# Patient Record
Sex: Female | Born: 1945 | Race: Black or African American | Hispanic: No | Marital: Married | Smoking: Never smoker
Health system: Southern US, Community
[De-identification: ages and names within clinical notes are randomized; demographics above are authoritative.]

## PROBLEM LIST (undated history)

## (undated) DIAGNOSIS — R748 Abnormal levels of other serum enzymes: Secondary | ICD-10-CM

## (undated) DIAGNOSIS — R059 Cough, unspecified: Secondary | ICD-10-CM

## (undated) DIAGNOSIS — I872 Venous insufficiency (chronic) (peripheral): Secondary | ICD-10-CM

## (undated) DIAGNOSIS — R062 Wheezing: Secondary | ICD-10-CM

## (undated) DIAGNOSIS — B379 Candidiasis, unspecified: Secondary | ICD-10-CM

## (undated) DIAGNOSIS — Z860100 Personal history of colon polyps, unspecified: Secondary | ICD-10-CM

## (undated) DIAGNOSIS — I10 Essential (primary) hypertension: Secondary | ICD-10-CM

## (undated) DIAGNOSIS — Z8619 Personal history of other infectious and parasitic diseases: Secondary | ICD-10-CM

## (undated) DIAGNOSIS — J329 Chronic sinusitis, unspecified: Secondary | ICD-10-CM

## (undated) DIAGNOSIS — B059 Measles without complication: Secondary | ICD-10-CM

## (undated) DIAGNOSIS — Z87898 Personal history of other specified conditions: Secondary | ICD-10-CM

## (undated) DIAGNOSIS — R05 Cough: Secondary | ICD-10-CM

## (undated) DIAGNOSIS — IMO0002 Reserved for concepts with insufficient information to code with codable children: Secondary | ICD-10-CM

## (undated) DIAGNOSIS — K219 Gastro-esophageal reflux disease without esophagitis: Secondary | ICD-10-CM

## (undated) DIAGNOSIS — R87619 Unspecified abnormal cytological findings in specimens from cervix uteri: Secondary | ICD-10-CM

## (undated) DIAGNOSIS — N816 Rectocele: Secondary | ICD-10-CM

## (undated) DIAGNOSIS — M1990 Unspecified osteoarthritis, unspecified site: Secondary | ICD-10-CM

## (undated) DIAGNOSIS — E7800 Pure hypercholesterolemia, unspecified: Secondary | ICD-10-CM

## (undated) DIAGNOSIS — L723 Sebaceous cyst: Secondary | ICD-10-CM

## (undated) DIAGNOSIS — K5730 Diverticulosis of large intestine without perforation or abscess without bleeding: Secondary | ICD-10-CM

## (undated) DIAGNOSIS — H269 Unspecified cataract: Secondary | ICD-10-CM

## (undated) DIAGNOSIS — M7989 Other specified soft tissue disorders: Secondary | ICD-10-CM

## (undated) DIAGNOSIS — E663 Overweight: Secondary | ICD-10-CM

## (undated) DIAGNOSIS — D649 Anemia, unspecified: Secondary | ICD-10-CM

## (undated) HISTORY — DX: Venous insufficiency (chronic) (peripheral): I87.2

## (undated) HISTORY — DX: Reserved for concepts with insufficient information to code with codable children: IMO0002

## (undated) HISTORY — DX: Personal history of other specified conditions: Z87.898

## (undated) HISTORY — DX: Unspecified osteoarthritis, unspecified site: M19.90

## (undated) HISTORY — DX: Candidiasis, unspecified: B37.9

## (undated) HISTORY — DX: Chronic sinusitis, unspecified: J32.9

## (undated) HISTORY — DX: Unspecified abnormal cytological findings in specimens from cervix uteri: R87.619

## (undated) HISTORY — DX: Personal history of colon polyps, unspecified: Z86.0100

## (undated) HISTORY — PX: ESOPHAGOGASTRODUODENOSCOPY: SHX1529

## (undated) HISTORY — DX: Gastro-esophageal reflux disease without esophagitis: K21.9

## (undated) HISTORY — DX: Pure hypercholesterolemia, unspecified: E78.00

## (undated) HISTORY — DX: Abnormal levels of other serum enzymes: R74.8

## (undated) HISTORY — DX: Other specified soft tissue disorders: M79.89

## (undated) HISTORY — DX: Sebaceous cyst: L72.3

## (undated) HISTORY — DX: Rectocele: N81.6

## (undated) HISTORY — DX: Diverticulosis of large intestine without perforation or abscess without bleeding: K57.30

## (undated) HISTORY — DX: Essential (primary) hypertension: I10

## (undated) HISTORY — DX: Unspecified cataract: H26.9

## (undated) HISTORY — DX: Personal history of colonic polyps: Z86.010

## (undated) HISTORY — DX: Personal history of other infectious and parasitic diseases: Z86.19

## (undated) HISTORY — DX: Cough, unspecified: R05.9

## (undated) HISTORY — DX: Wheezing: R06.2

## (undated) HISTORY — DX: Overweight: E66.3

## (undated) HISTORY — DX: Anemia, unspecified: D64.9

## (undated) HISTORY — PX: COLONOSCOPY: SHX174

## (undated) HISTORY — DX: Cough: R05

## (undated) HISTORY — DX: Measles without complication: B05.9

---

## 1976-08-22 HISTORY — PX: BREAST SURGERY: SHX581

## 1986-08-22 HISTORY — PX: ABDOMINAL HYSTERECTOMY: SHX81

## 1997-12-19 ENCOUNTER — Other Ambulatory Visit: Admission: RE | Admit: 1997-12-19 | Discharge: 1997-12-19 | Payer: Self-pay | Admitting: Obstetrics and Gynecology

## 1998-12-22 ENCOUNTER — Other Ambulatory Visit: Admission: RE | Admit: 1998-12-22 | Discharge: 1998-12-22 | Payer: Self-pay | Admitting: Obstetrics and Gynecology

## 1999-12-31 ENCOUNTER — Other Ambulatory Visit: Admission: RE | Admit: 1999-12-31 | Discharge: 1999-12-31 | Payer: Self-pay | Admitting: Obstetrics and Gynecology

## 2001-01-02 ENCOUNTER — Other Ambulatory Visit: Admission: RE | Admit: 2001-01-02 | Discharge: 2001-01-02 | Payer: Self-pay | Admitting: Obstetrics and Gynecology

## 2001-04-24 ENCOUNTER — Encounter (INDEPENDENT_AMBULATORY_CARE_PROVIDER_SITE_OTHER): Payer: Self-pay | Admitting: Specialist

## 2001-04-24 ENCOUNTER — Ambulatory Visit (HOSPITAL_COMMUNITY): Admission: RE | Admit: 2001-04-24 | Discharge: 2001-04-24 | Payer: Self-pay | Admitting: Internal Medicine

## 2005-05-20 ENCOUNTER — Ambulatory Visit: Payer: Self-pay | Admitting: Pulmonary Disease

## 2005-08-19 ENCOUNTER — Ambulatory Visit: Payer: Self-pay | Admitting: Pulmonary Disease

## 2005-12-26 ENCOUNTER — Ambulatory Visit: Payer: Self-pay | Admitting: Pulmonary Disease

## 2006-01-02 ENCOUNTER — Ambulatory Visit: Payer: Self-pay | Admitting: Pulmonary Disease

## 2006-01-23 ENCOUNTER — Ambulatory Visit: Payer: Self-pay | Admitting: Pulmonary Disease

## 2007-02-19 ENCOUNTER — Ambulatory Visit: Payer: Self-pay | Admitting: Pulmonary Disease

## 2007-02-19 LAB — CONVERTED CEMR LAB
BUN: 10 mg/dL (ref 6–23)
GFR calc Af Amer: 109 mL/min
GFR calc non Af Amer: 90 mL/min
Potassium: 4 meq/L (ref 3.5–5.1)

## 2007-09-06 ENCOUNTER — Encounter: Payer: Self-pay | Admitting: Pulmonary Disease

## 2007-09-27 DIAGNOSIS — K219 Gastro-esophageal reflux disease without esophagitis: Secondary | ICD-10-CM

## 2007-09-27 DIAGNOSIS — I872 Venous insufficiency (chronic) (peripheral): Secondary | ICD-10-CM

## 2007-09-27 DIAGNOSIS — J45909 Unspecified asthma, uncomplicated: Secondary | ICD-10-CM

## 2007-09-27 DIAGNOSIS — D126 Benign neoplasm of colon, unspecified: Secondary | ICD-10-CM

## 2007-09-27 DIAGNOSIS — I10 Essential (primary) hypertension: Secondary | ICD-10-CM

## 2008-01-16 ENCOUNTER — Encounter: Payer: Self-pay | Admitting: Pulmonary Disease

## 2008-03-10 ENCOUNTER — Telehealth (INDEPENDENT_AMBULATORY_CARE_PROVIDER_SITE_OTHER): Payer: Self-pay | Admitting: *Deleted

## 2008-03-20 ENCOUNTER — Ambulatory Visit: Payer: Self-pay | Admitting: Pulmonary Disease

## 2008-03-20 DIAGNOSIS — J309 Allergic rhinitis, unspecified: Secondary | ICD-10-CM | POA: Insufficient documentation

## 2008-03-20 DIAGNOSIS — E785 Hyperlipidemia, unspecified: Secondary | ICD-10-CM | POA: Insufficient documentation

## 2008-03-20 DIAGNOSIS — E663 Overweight: Secondary | ICD-10-CM | POA: Insufficient documentation

## 2008-03-20 DIAGNOSIS — K5730 Diverticulosis of large intestine without perforation or abscess without bleeding: Secondary | ICD-10-CM | POA: Insufficient documentation

## 2008-03-20 DIAGNOSIS — J329 Chronic sinusitis, unspecified: Secondary | ICD-10-CM | POA: Insufficient documentation

## 2008-03-21 ENCOUNTER — Ambulatory Visit: Payer: Self-pay | Admitting: Pulmonary Disease

## 2008-03-21 ENCOUNTER — Telehealth (INDEPENDENT_AMBULATORY_CARE_PROVIDER_SITE_OTHER): Payer: Self-pay | Admitting: *Deleted

## 2008-03-24 ENCOUNTER — Ambulatory Visit: Payer: Self-pay | Admitting: Pulmonary Disease

## 2008-03-25 LAB — CONVERTED CEMR LAB
Albumin: 3.9 g/dL (ref 3.5–5.2)
Alkaline Phosphatase: 230 units/L — ABNORMAL HIGH (ref 39–117)
BUN: 8 mg/dL (ref 6–23)
Basophils Absolute: 0.1 10*3/uL (ref 0.0–0.1)
Cholesterol: 202 mg/dL (ref 0–200)
Direct LDL: 141.4 mg/dL
Eosinophils Absolute: 0.1 10*3/uL (ref 0.0–0.7)
Eosinophils Relative: 2.2 % (ref 0.0–5.0)
GFR calc Af Amer: 108 mL/min
GFR calc non Af Amer: 90 mL/min
HCT: 38.7 % (ref 36.0–46.0)
MCHC: 33.1 g/dL (ref 30.0–36.0)
MCV: 79.7 fL (ref 78.0–100.0)
Monocytes Absolute: 0.4 10*3/uL (ref 0.1–1.0)
Platelets: 262 10*3/uL (ref 150–400)
Potassium: 3.7 meq/L (ref 3.5–5.1)
RDW: 14.5 % (ref 11.5–14.6)
Triglycerides: 53 mg/dL (ref 0–149)

## 2008-04-04 ENCOUNTER — Encounter: Payer: Self-pay | Admitting: Pulmonary Disease

## 2008-05-02 ENCOUNTER — Encounter: Payer: Self-pay | Admitting: Pulmonary Disease

## 2008-08-11 ENCOUNTER — Ambulatory Visit: Payer: Self-pay | Admitting: Internal Medicine

## 2008-08-11 DIAGNOSIS — G4700 Insomnia, unspecified: Secondary | ICD-10-CM | POA: Insufficient documentation

## 2008-09-08 ENCOUNTER — Encounter: Payer: Self-pay | Admitting: Pulmonary Disease

## 2008-10-20 HISTORY — PX: APOGEE / PERIGEE REPAIR: SHX1182

## 2008-11-05 ENCOUNTER — Ambulatory Visit (HOSPITAL_COMMUNITY): Admission: RE | Admit: 2008-11-05 | Discharge: 2008-11-06 | Payer: Self-pay | Admitting: Obstetrics and Gynecology

## 2008-11-05 DIAGNOSIS — IMO0002 Reserved for concepts with insufficient information to code with codable children: Secondary | ICD-10-CM

## 2008-11-05 HISTORY — PX: ANTERIOR AND POSTERIOR REPAIR: SHX1172

## 2008-11-05 HISTORY — DX: Reserved for concepts with insufficient information to code with codable children: IMO0002

## 2008-11-06 DIAGNOSIS — N816 Rectocele: Secondary | ICD-10-CM

## 2008-11-06 DIAGNOSIS — Z87898 Personal history of other specified conditions: Secondary | ICD-10-CM

## 2008-11-06 HISTORY — DX: Rectocele: N81.6

## 2008-11-06 HISTORY — DX: Personal history of other specified conditions: Z87.898

## 2009-05-04 ENCOUNTER — Encounter: Payer: Self-pay | Admitting: Pulmonary Disease

## 2009-06-24 ENCOUNTER — Telehealth: Payer: Self-pay | Admitting: Pulmonary Disease

## 2009-07-01 ENCOUNTER — Ambulatory Visit: Payer: Self-pay | Admitting: Pulmonary Disease

## 2009-07-01 DIAGNOSIS — M1990 Unspecified osteoarthritis, unspecified site: Secondary | ICD-10-CM | POA: Insufficient documentation

## 2009-07-01 DIAGNOSIS — R748 Abnormal levels of other serum enzymes: Secondary | ICD-10-CM | POA: Insufficient documentation

## 2009-07-01 DIAGNOSIS — M899 Disorder of bone, unspecified: Secondary | ICD-10-CM

## 2009-07-01 DIAGNOSIS — M949 Disorder of cartilage, unspecified: Secondary | ICD-10-CM

## 2009-07-01 HISTORY — DX: Abnormal levels of other serum enzymes: R74.8

## 2009-07-02 LAB — CONVERTED CEMR LAB
ALT: 28 units/L (ref 0–35)
Albumin: 3.9 g/dL (ref 3.5–5.2)
Basophils Relative: 0.3 % (ref 0.0–3.0)
Cholesterol: 171 mg/dL (ref 0–200)
Eosinophils Relative: 2.2 % (ref 0.0–5.0)
GFR calc non Af Amer: 107.78 mL/min (ref >60)
GGT: 54 units/L — ABNORMAL HIGH (ref 7–51)
Glucose, Bld: 98 mg/dL (ref 70–99)
HDL: 56.9 mg/dL (ref >39.00)
Lymphocytes Relative: 44.5 % (ref 12.0–46.0)
Neutrophils Relative %: 44.5 % (ref 43.0–77.0)
Potassium: 3.5 meq/L (ref 3.5–5.1)
RBC: 4.66 M/uL (ref 3.87–5.11)
Sodium: 143 meq/L (ref 135–145)
Total Protein: 8.1 g/dL (ref 6.0–8.3)
VLDL: 14 mg/dL (ref 0.0–40.0)
WBC: 4.9 10*3/uL (ref 4.5–10.5)

## 2009-07-03 LAB — CONVERTED CEMR LAB: Vit D, 25-Hydroxy: 23 ng/mL — ABNORMAL LOW (ref 30–89)

## 2009-07-09 ENCOUNTER — Encounter: Payer: Self-pay | Admitting: Pulmonary Disease

## 2009-08-22 HISTORY — PX: OTHER SURGICAL HISTORY: SHX169

## 2009-09-23 ENCOUNTER — Encounter: Payer: Self-pay | Admitting: Pulmonary Disease

## 2009-12-23 ENCOUNTER — Telehealth: Payer: Self-pay | Admitting: Pulmonary Disease

## 2010-05-05 ENCOUNTER — Encounter: Payer: Self-pay | Admitting: Pulmonary Disease

## 2010-07-29 ENCOUNTER — Ambulatory Visit: Payer: Self-pay | Admitting: Pulmonary Disease

## 2010-08-02 ENCOUNTER — Ambulatory Visit: Payer: Self-pay | Admitting: Pulmonary Disease

## 2010-08-02 ENCOUNTER — Encounter: Payer: Self-pay | Admitting: Pulmonary Disease

## 2010-08-09 ENCOUNTER — Telehealth: Payer: Self-pay | Admitting: Pulmonary Disease

## 2010-08-09 LAB — CONVERTED CEMR LAB
Basophils Relative: 0.7 % (ref 0.0–3.0)
Bilirubin, Direct: 0.1 mg/dL (ref 0.0–0.3)
CO2: 28 meq/L (ref 19–32)
Calcium: 9.3 mg/dL (ref 8.4–10.5)
Creatinine, Ser: 0.7 mg/dL (ref 0.4–1.2)
Eosinophils Absolute: 0.2 10*3/uL (ref 0.0–0.7)
GFR calc non Af Amer: 112.99 mL/min (ref >60.00)
HCT: 36.7 % (ref 36.0–46.0)
HDL: 55.5 mg/dL (ref >39.00)
Hemoglobin: 12.3 g/dL (ref 12.0–15.0)
LDL Cholesterol: 87 mg/dL (ref 0–99)
Lymphocytes Relative: 36.5 % (ref 12.0–46.0)
MCHC: 33.5 g/dL (ref 30.0–36.0)
Monocytes Relative: 7.6 % (ref 3.0–12.0)
Neutro Abs: 2.9 10*3/uL (ref 1.4–7.7)
RBC: 4.43 M/uL (ref 3.87–5.11)
Total CHOL/HDL Ratio: 3
Total Protein: 6.7 g/dL (ref 6.0–8.3)
Triglycerides: 36 mg/dL (ref 0.0–149.0)
VLDL: 7.2 mg/dL (ref 0.0–40.0)

## 2010-09-21 NOTE — Letter (Signed)
[facility]   Imported By: Lennie Odor 05/19/2010 12:21:45  _____________________________________________________________________  External Attachment:    Type:   Image     Comment:   External Document

## 2010-09-21 NOTE — Progress Notes (Signed)
Summary: clarification -   Phone Note From Pharmacy Call back at [phone]   Caller: [facility] Call For: Charlissa Petros  Request: Speak with Nurse Summary of Call: Needs clarification for metoclopramide hcl and also needs to discuss duration for med. Initial call taken by: Darletta Moll,  Dec 23, 2009 10:07 AM  Follow-up for Phone Call        Spoke to pharmacists at [facility] and he states there is a black box warning on long term use of reglan because it can cause tardive dyskinesia. [facility] wants to know if SN wants them to continue to fill reglan? Please advise. Carron Curie CMA  Dec 23, 2009 11:09 AM   Additional Follow-up for Phone Call Additional follow up Details #1::        per SN: stop metoclopramide d/t black box warning.    called spoke with pharmacsit at [facility], advised of SN's recs.  LMOM TCB for pt to notify her of this.  told to ask for leigh. Boone Master CNA  Dec 23, 2009 2:31 PM     Additional Follow-up for Phone Call Additional follow up Details #2::    pt advised. Carron Curie CMA  Dec 23, 2009 4:07 PM

## 2010-09-21 NOTE — Assessment & Plan Note (Signed)
Summary: physcial/cb   CC:  Yearly ROV & review of mult medical problems....  History of Present Illness: 65 y/o BF here for a yearly follow up and med refills... he has multiple medical problems as noted below...    ~  November 10.2010:  For the most part she has had a good year without new complaints or concerns... she is followed regularly by DrSharma who has her on Xyzal, Singulair, Omnaris & allergy shots for her allergies; and Symbicort, Ventolin for her asthma... she appears well controlled w/o recent symptoms or exac...  her BP is well controoled on the Procardia & Lasix; and her Cholesterol is doing well on the Pravachol... her CC is pain in her left knee, and she has gained 11# up to 212# today... we discussed diet/ exercise/ get the weight down!!! We will also check XRay of left knee (DJD- joint effusion)& give her a trial of Mobic> next step= refer to Ortho for further eval.   ~  July 29, 2010:  Yearly ROV- doing reasonably well w/o new complaints or concerns... she continues to be followed by DrSharma on meds below... BP remains controlled on Procardia & Lasix... Chol has been OK on Prav40 & she will ret for FLP... she knows that she needs to do better re her weight & we reviewed diet + exercise... she saw DrGioffre 11/10 & was given a knee injection, Mobic helps some- refilled...    Current Problem List:  ALLERGIC RHINITIS (ICD-477.9) - followed by DrSharma on shots weekly... also takes OMNARIS 2sp in each nostril daily,  SINGULAIR 10mg /d,  XYZAL 5mg /d... prev allergy tests + for trees, grass, ragweed, molds, dust, cat>dog, etc...  Hx of SINUSITIS (ICD-473.9) - no recent infections or antibiotics required.  ASTHMA (ICD-493.90) - Former pt of DrSlotnick yrs ago, also eval by DrKozlow & DrSane at [facility]... stable on SYMBICORT 160 2spBid & VENTOLIN HFA as needed (DrSharma added SPIRIVA but it's too $$ & she only uses samples)... she has min cough, small amt clear sputum and no recent  wheezing, increased dyspnea, chest pain, etc...  HYPERTENSION (ICD-401.9) - controlled on PROCARDIA XL 60mg /d,  LASIX 40mg /d,  KCl - 2/d... BP today = 140/84 and she states BP's at home are "OK" as well... denies HA, fatigue, visual changes, CP, palipit, dizziness, syncope, dyspnea, edema, etc...  VENOUS INSUFFICIENCY (ICD-459.81) - she follows low sodium diet, elevates legs, wears support hose, and takes the Lasix...  HYPERCHOLESTEROLEMIA, BORDERLINE (ICD-272.4) - on PRAVASTATIN 40mg /d + low fat diet...  ~  FLP 5/07 showed TChol 190, TG 68, HDL 56, LDL 120  ~  FLP 7/09 showed TChol 202, TG 53, HDL 52, LDL 141... rec> add Prav40.  ~  FLP 11/10 (on Prav40) showed TChol 171, TG 70, HDL 57, LDL 100... continue same.  ~  FLP 12/11 (on Prav40) showed   OVERWEIGHT (ICD-278.02) - discussed low carb/ low fat diet and increased exercise program...  ~  weight 7/09 is down 5# to 203#... she knows that she needs to do better...  ~  weight 11/10 = 212#  ~  weight 12/11 = 215#  GERD (ICD-530.81) - on ZEGERID Bid, & off prev reglan rx... EGD 9/02 showed sliding HH, mild gastritis, neg biopsy...  DIVERTICULOSIS OF COLON (ICD-562.10) + COLONIC POLYPS (ICD-211.3) - last colonoscopy 7/06 by DrDBrodie showed extensive divertics, spasm, redund colon, hems, and sm polyp (no path avail)...  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 she never  ret for the GGT.  ~  labs 11/10 showed AlkPhos= 195 ( 39-117), GGT= 54 (7-51)... continue to follow.  ~  labs 12/10 showed AlkPhos=   DEGENERATIVE JOINT DISEASE (ICD-715.90)  ~  11/10: c/o left knee pain & XRay shows joint effusion- trial MOBIC 15mg /d & refer to Ortho> seen by DrGioffre & given knee injection.  Health Maintenance -  GYN= DrARoberts... BMD at [facility] office 2005 was WNL w/ TScores -0.9 to +1.1.Marland KitchenMarland Kitchen Mammograms neg at [facility] 2/11... s/p hyst, and A/P repair by DrRoberts 3/10... she had PNEUMOVAX in 2006  (age58) & repeated 12/11 (age64).   Preventive Screening-Counseling & Management  Alcohol-Tobacco     Smoking Status: never  Allergies: 1)  ! Penicillin 2)  ! Demerol 3)  ! * Zestoretic  Comments:  Nurse/Medical Assistant: The patient's medications and allergies were reviewed with the patient and were updated in the Medication and Allergy Lists.  Past History:  Past Medical History: ALLERGIC RHINITIS (ICD-477.9) Hx of SINUSITIS (ICD-473.9) ASTHMA (ICD-493.90) HYPERTENSION (ICD-401.9) VENOUS INSUFFICIENCY (ICD-459.81) HYPERCHOLESTEROLEMIA, BORDERLINE (ICD-272.4) OVERWEIGHT (ICD-278.02) GERD (ICD-530.81) DIVERTICULOSIS OF COLON (ICD-562.10) COLONIC POLYPS (ICD-211.3) ALKALINE PHOSPHATASE, ELEVATED (ICD-790.5) DEGENERATIVE JOINT DISEASE (ICD-715.90)  Past Surgical History: S/P hysterectomy S/P AP repair 3/10 by DrARoberts  Family History: Reviewed history from 03/20/2008 and no changes required. mother deceased age 92 from stroke father deceased age 72 from cancer no siblings  Social History: Reviewed history from 03/20/2008 and no changes required. 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  The patient denies anorexia, fever, weight loss, weight gain, vision loss, hoarseness, chest pain, syncope, peripheral edema, prolonged cough, headaches, hemoptysis, abdominal pain, melena, hematochezia, severe indigestion/heartburn, hematuria, incontinence, muscle weakness, suspicious skin lesions, transient blindness, difficulty walking, depression, unusual weight change, abnormal bleeding, enlarged lymph nodes, and angioedema.    Vital Signs:  Patient profile:   65 year old female Height:      63 inches Weight:      214.38 pounds BMI:     38.11 O2 Sat:      99 % on room air Temp:     97.1 degrees F oral Pulse rate:   83 / minute BP  sitting:   140 / 84  (right arm) Cuff size:   regular  Vitals Entered By: Randell Loop CMA (July 29, 2010 11:21 AM)  O2 Sat at Rest %:  99 O2 Flow:  room air CC: Yearly ROV & review of mult medical problems... Is Patient Diabetic? No Pain Assessment Patient in pain? yes      Onset of pain  stiffness in her knees at times Comments meds udpated today    Physical Exam  Additional Exam:  WD, WN, 65 y/o BF in NAD... GENERAL:  Alert & oriented; pleasant & cooperative... HEENT:  [place]/AT, EOM-wnl, PERRLA, EACs-clear, TMs-wnl, NOSE-clear, THROAT-clear & wnl. NECK:  Supple w/ fairROM; no JVD; normal carotid impulses w/o bruits; no thyromegaly or nodules palpated; no lymphadenopathy. CHEST:  Clear to P & A; without wheezes/ rales/ or rhonchi heard... HEART:  Regular Rhythm; without murmurs/ rubs/ or gallops detected... ABDOMEN:  Soft & nontender; normal bowel sounds; no organomegaly or masses palpated.... EXT: without deformities, mild arthritic changes & decr ROM left knee; no varicose veins/ +venous insuffic/ 1+edema. NEURO:  CN's intact; no focal neuro deficits... DERM:  No  lesions noted; no rash etc...    MISC. Report  Procedure date:  07/29/2010  Findings:      DATA REVIEWED:  ~  she will ret to our lab next week for FASTING blood work...  ~  Office note DrSharma 05/05/10...  ~  Mamm[facility] at [facility] 09/23/09...  ~  Ortho eval DrGioffre 07/09/09...   Impression & Recommendations:  Problem # 1:  ASTHMA (ICD-493.90) Stable on meds>  chest is clear, no recent exac by hx... meds written by DrSharma. Her updated medication list for this problem includes:    Symbicort 160-4.5 Mcg/act Aero (Budesonide-formoterol fumarate) ..... Use 2 puffs two times a day    Ventolin Hfa 108 (90 Base) Mcg/act Aers (Albuterol sulfate) .Marland Kitchen... 1-2 sprays every 4-6 hours as needed for wheezing...    Singulair 10 Mg Tabs (Montelukast sodium) .Marland Kitchen... Take one tablet by mouth daily...  Problem # 2:   HYPERTENSION (ICD-401.9) Controlled on meds>  continue same, refilled. Her updated medication list for this problem includes:    Procardia Xl 60 Mg Tb24 (Nifedipine) .Marland Kitchen... Take 1 tablet by mouth once a day    Lasix 40 Mg Tabs (Furosemide) .Marland Kitchen... Take 1 tablet by mouth once a day  Problem # 3:  VENOUS INSUFFICIENCY (ICD-459.81) Aware>  knows to avoid sodium, elevate, support hose, etc...  Problem # 4:  HYPERCHOLESTEROLEMIA, BORDERLINE (ICD-272.4) She will ret next week for FLP & we will review & notify her of the results... Her updated medication list for this problem includes:    Pravastatin Sodium 40 Mg Tabs (Pravastatin sodium) .Marland Kitchen... Take one tablet by mouth at bedtime  Problem # 5:  OVERWEIGHT (ICD-278.02) We reviewed diet/ exercise/ & wt reduction strategies...  Problem # 6:  GERD (ICD-530.81) She is stable on the PPI therapy>  continue same. Her updated medication list for this problem includes:    Zegerid 20-1100 Mg Caps (Omeprazole-sodium bicarbonate) .Marland Kitchen... Take one tablet by mouth two times a day  Problem # 7:  DEGENERATIVE JOINT DISEASE (ICD-715.90) She had eval Ortho, DrGioffre in 2010... continue Mobic & ortho f/u as needed... Her updated medication list for this problem includes:    Mobic 15 Mg Tabs (Meloxicam) .Marland Kitchen... Take 1 tab by mouth once daily as needed for arthritis pain...  Problem # 8:  OTHER MEDICAL PROBLEMS AS NOTED>>>  Complete Medication List: 1)  Omnaris 50 Mcg/act Susp (Ciclesonide) .... Use 2 sprays in each nostril daily.Marland Kitchen 2)  Xyzal 5 Mg Tabs (Levocetirizine dihydrochloride) .... Take 1 tablet by mouth once a day 3)  Symbicort 160-4.5 Mcg/act Aero (Budesonide-formoterol fumarate) .... Use 2 puffs two times a day 4)  Ventolin Hfa 108 (90 Base) Mcg/act Aers (Albuterol sulfate) .Marland Kitchen.. 1-2 sprays every 4-6 hours as needed for wheezing... 5)  Singulair 10 Mg Tabs (Montelukast sodium) .... Take one tablet by mouth daily.Marland KitchenMarland Kitchen 6)  Tussionex Pennkinetic Er 8-10 Mg/6ml  Lqcr (Chlorpheniramine-hydrocodone) .... Take one teaspoon by mouth every 12 hours as needed for cough 7)  Procardia Xl 60 Mg Tb24 (Nifedipine) .... Take 1 tablet by mouth once a day 8)  Lasix 40 Mg Tabs (Furosemide) .... Take 1 tablet by mouth once a day 9)  Klor-con M20 20 Meq Tbcr (Potassium chloride crys cr) .... Take 2  tablets  by mouth daily... 10)  Pravastatin Sodium 40 Mg Tabs (Pravastatin sodium) .... Take one tablet by mouth at bedtime 11)  Zegerid 20-1100 Mg Caps (Omeprazole-sodium bicarbonate) .... Take one tablet by mouth two times a day 12)  Mobic 15 Mg Tabs (Meloxicam) .... Take 1 tab by mouth once daily as needed for arthritis pain... 13)  Multivitamin/iron Tabs (Multiple vitamins-iron) .... Take 1 tablet by mouth 3 times weekly 14)  Vitamin D 1000 Unit Tabs (Cholecalciferol) .... Take one tablet by mouth 2 times weekly  Other Orders: Pneumococcal Vaccine (91478) Admin 1st Vaccine (29562)  Patient Instructions: 1)  Today we updated your med list- see below.... 2)  We refilled your meds per request... 3)  Please return to our lab one morning next week for your FASTING blood work...  4)  Then please call the "phone tree" in a few days for your lab results.Marland KitchenMarland Kitchen  5)  We gave you the follow up Pneumonia vaccine today- this is the last Pneumovax you will need... 6)  Call for any problems.Marland KitchenMarland Kitchen 7)  Please schedule a follow-up appointment in 1 year, sooner as needed. Prescriptions: Sandria Senter ER 8-10 MG/5ML LQCR (CHLORPHENIRAMINE-HYDROCODONE) take one teaspoon by mouth every 12 hours as needed for cough  #4 oz x 6   Entered and Authorized by:   Michele Mcalpine MD   Signed by:   Michele Mcalpine MD on 07/29/2010   Method used:   Print then Give to Patient   RxID:   [id number] MOBIC 15 MG TABS (MELOXICAM) take 1 tab by mouth once daily as needed for arthritis pain...  #30 x 6   Entered and Authorized by:   Michele Mcalpine MD   Signed by:   Michele Mcalpine MD on 07/29/2010    Method used:   Print then Give to Patient   RxID:   [id number] PRAVASTATIN SODIUM 40 MG TABS (PRAVASTATIN SODIUM) take one tablet by mouth at bedtime  #90 x 4   Entered and Authorized by:   Michele Mcalpine MD   Signed by:   Michele Mcalpine MD on 07/29/2010   Method used:   Print then Give to Patient   RxID:   [id number] KLOR-CON M20 20 MEQ  TBCR (POTASSIUM CHLORIDE CRYS CR) take 2  tablets  by mouth daily...  #180 x 4   Entered and Authorized by:   Michele Mcalpine MD   Signed by:   Michele Mcalpine MD on 07/29/2010   Method used:   Print then Give to Patient   RxID:   [id number] LASIX 40 MG  TABS (FUROSEMIDE) Take 1 tablet by mouth once a day  #90 x 4   Entered and Authorized by:   Michele Mcalpine MD   Signed by:   Michele Mcalpine MD on 07/29/2010   Method used:   Print then Give to Patient   RxID:   [id number] PROCARDIA XL 60 MG  TB24 (NIFEDIPINE) Take 1 tablet by mouth once a day  #90 x 4   Entered and Authorized by:   Michele Mcalpine MD   Signed by:   Michele Mcalpine MD on 07/29/2010   Method used:   Print then Give to Patient   RxID:   [id number]    Immunization History:  Influenza Immunization History:    Influenza:  historical (07/05/2010)  Immunizations Administered:  Pneumonia Vaccine:    Vaccine Type: Pneumovax (Medicare)    Site: right deltoid    Mfr: Merck    Dose: 0.5 ml    Route: IM    Given by: Randell Loop CMA    Exp. Date: 12/30/2011    Lot #: [id number]    VIS given: 07/27/09  version given July 29, 2010.

## 2010-09-23 NOTE — Progress Notes (Signed)
Summary: lab results  Phone Note Call from Patient Call back at Home Phone [phone]   Caller: Patient Call For: Lance Galas Reason for Call: Talk to Nurse Summary of Call: Patient asking for results of labwork. Initial call taken by: Lehman Prom,  August 09, 2010 11:43 AM  Follow-up for Phone Call        called and spoke with pt. pt is calling for her lab results done 08/02/2010. labs unsigned in EMR.  Aundra Millet Reynolds LPN  August 09, 2010 1:22 PM   Additional Follow-up for Phone Call Additional follow up Details #1::        per SN----labs are all ok---lip panel looks great on prav 40 so keep this the same   chems are normal and blood count is ok--thyroid normal  and vit d is normal--pt is aware of lab results and will call for any further questions Randell Loop [facility]  August 09, 2010 5:08 PM

## 2010-09-27 ENCOUNTER — Encounter: Payer: Self-pay | Admitting: Pulmonary Disease

## 2010-10-05 ENCOUNTER — Encounter: Payer: Self-pay | Admitting: Pulmonary Disease

## 2010-10-06 ENCOUNTER — Ambulatory Visit (INDEPENDENT_AMBULATORY_CARE_PROVIDER_SITE_OTHER): Payer: Medicare Other | Admitting: Pulmonary Disease

## 2010-10-06 ENCOUNTER — Encounter: Payer: Self-pay | Admitting: Pulmonary Disease

## 2010-10-06 DIAGNOSIS — E663 Overweight: Secondary | ICD-10-CM

## 2010-10-06 DIAGNOSIS — K219 Gastro-esophageal reflux disease without esophagitis: Secondary | ICD-10-CM

## 2010-10-06 DIAGNOSIS — M1990 Unspecified osteoarthritis, unspecified site: Secondary | ICD-10-CM

## 2010-10-06 DIAGNOSIS — I10 Essential (primary) hypertension: Secondary | ICD-10-CM

## 2010-10-06 DIAGNOSIS — K5730 Diverticulosis of large intestine without perforation or abscess without bleeding: Secondary | ICD-10-CM

## 2010-10-06 DIAGNOSIS — D126 Benign neoplasm of colon, unspecified: Secondary | ICD-10-CM

## 2010-10-06 DIAGNOSIS — E785 Hyperlipidemia, unspecified: Secondary | ICD-10-CM

## 2010-10-06 DIAGNOSIS — J45909 Unspecified asthma, uncomplicated: Secondary | ICD-10-CM

## 2010-10-06 DIAGNOSIS — I872 Venous insufficiency (chronic) (peripheral): Secondary | ICD-10-CM

## 2010-10-13 NOTE — Assessment & Plan Note (Signed)
Summary: OV BLOOD PRESSURE//SH   CC:  2 month ROV & add-on for HBP....  History of Present Illness: 65 y/o BF here for a yearly follow up and med refills... he has multiple medical problems as noted below...    ~  July 29, 2010:  Yearly ROV- doing reasonably well w/o new complaints or concerns... she continues to be followed by DrSharma on meds below... BP remains controlled on Procardia & Lasix... Chol has been OK on Prav40 & she will ret for FLP... she knows that she needs to do better re her weight & we reviewed diet + exercise... she saw DrGioffre 11/10 & was given a knee injection, Mobic helps some- refilled...   ~  October 06, 2010:    Add-on appt due to elev BP readings at church exercise program> she brings list w/ recent BP 140's-170/ 90's;  feeling well w/o HA, visual symptoms, CP, palpit, edema, etc;  reminded about diet- low sodium, low cal, etc;  we decided to add LOSARTAN 50mg /d.    Asthma, allergies, Lipids, GI- all stable & doing  satis per pt...    Current Problem List:  ALLERGIC RHINITIS (ICD-477.9) - followed by DrSharma on shots weekly... also takes OMNARIS 2sp in each nostril daily,  SINGULAIR 10mg /d,  XYZAL 5mg /d... prev allergy tests + for trees, grass, ragweed, molds, dust, cat>dog, etc...  Hx of SINUSITIS (ICD-473.9) - no recent infections or antibiotics required.  ASTHMA (ICD-493.90) - Former pt of DrSlotnick yrs ago, also eval by DrKozlow & DrSane at [employer] [facility]... stable on SYMBICORT 160 2spBid & VENTOLIN HFA as needed (DrSharma added SPIRIVA but it's too $$ & she only uses samples)... she has min cough, small amt clear sputum and no recent wheezing, increased dyspnea, chest pain, etc...  HYPERTENSION (ICD-401.9) - on PROCARDIA XL 60mg /d,  LASIX 40mg /d,  KCl - 2/d... BP today = 142/84 and recent BP readings elev at the church exercise program...denies HA, fatigue, visual changes, CP, palipit, dizziness, syncope, dyspnea, edema, etc...  ~  2/12:  add-on for elev  BP at church exerc program> we added LOSARTAN 50mg /d.  VENOUS INSUFFICIENCY (ICD-459.81) - she follows low sodium diet, elevates legs, wears support hose, and takes the Lasix...  HYPERCHOLESTEROLEMIA, BORDERLINE (ICD-272.4) - on PRAVASTATIN 40mg /d + low fat diet...  ~  FLP 5/07 showed TChol 190, TG 68, HDL 56, LDL 120  ~  FLP 7/09 showed TChol 202, TG 53, HDL 52, LDL 141... rec> add Prav40.  ~  FLP 11/10 (on Prav40) showed TChol 171, TG 70, HDL 57, LDL 100  ~  FLP 12/11 (on OZHY86) showed TChol 150, TG 36, HDL 56, LDL 87  OVERWEIGHT (ICD-278.02) - discussed low carb/ low fat diet and increased exercise program...  ~  weight 7/09 is down 5# to 203#... she knows that she needs to do better...  ~  weight 11/10 = 212#  ~  weight 12/11 = 215#  ~  weight 2/12 = 209#  GERD (ICD-530.81) - on ZEGERID Bid, & off prev reglan rx... EGD 9/02 showed sliding HH, mild gastritis, neg biopsy...  DIVERTICULOSIS OF COLON (ICD-562.10) + COLONIC POLYPS (ICD-211.3) - last colonoscopy 7/06 by DrDBrodie showed extensive divertics, spasm, redund colon, hems, and sm polyp (no path avail)...  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 she never ret for the GGT.  ~  labs 11/10 showed AlkPhos= 195 ( 39-117), GGT= 54 (7-51)... continue to follow.  ~  labs 12/10 showed AlkPhos=  DEGENERATIVE JOINT DISEASE (ICD-715.90)  ~  11/10: c/o left knee pain & XRay shows joint effusion- trial MOBIC 15mg /d & refer to Ortho> seen by DrGioffre & given knee injection.  Health Maintenance -  GYN= DrARoberts... BMD [facility] 2005 was WNL w/ TScores -0.9 to +1.1.Marland KitchenMarland Kitchen Mammograms neg at [facility] 2/11... s/p hyst, and A/P repair by DrRoberts 3/10... she had PNEUMOVAX in 2006 (age58) & repeated 12/11 (age64).   Preventive Screening-Counseling & Management  Alcohol-Tobacco     Smoking Status: never  Allergies: 1)  ! Penicillin 2)  ! Demerol 3)  ! *  Zestoretic  Comments:  Nurse/Medical Assistant: The patient's medications and allergies were reviewed with the patient and were updated in the Medication and Allergy Lists.  Past History:  Past Medical History: ALLERGIC RHINITIS (ICD-477.9) Hx of SINUSITIS (ICD-473.9) ASTHMA (ICD-493.90) HYPERTENSION (ICD-401.9) VENOUS INSUFFICIENCY (ICD-459.81) HYPERCHOLESTEROLEMIA, BORDERLINE (ICD-272.4) OVERWEIGHT (ICD-278.02) GERD (ICD-530.81) DIVERTICULOSIS OF COLON (ICD-562.10) COLONIC POLYPS (ICD-211.3) ALKALINE PHOSPHATASE, ELEVATED (ICD-790.5) DEGENERATIVE JOINT DISEASE (ICD-715.90)  Past Surgical History: S/P hysterectomy S/P AP repair 3/10 by DrARoberts  Family History: Reviewed history from 07/29/2010 and no changes required. mother deceased age 15 from stroke father deceased age 57 from cancer no siblings  Social History: Reviewed history from 07/29/2010 and no changes required. 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  The patient denies anorexia, fever, weight loss, weight gain, vision loss, decreased hearing, hoarseness, chest pain, syncope, peripheral edema, prolonged cough, headaches, hemoptysis, abdominal pain, melena, hematochezia, severe indigestion/heartburn, hematuria, incontinence, muscle weakness, suspicious skin lesions, transient blindness, difficulty walking, depression, unusual weight change, abnormal bleeding, enlarged lymph nodes, and angioedema.    Vital Signs:  Patient profile:   65 year old female Height:      63 inches Weight:      209 pounds BMI:     37.16 O2 Sat:      99 % on Room air Temp:     98.2 degrees F oral Pulse rate:   85 / minute BP sitting:   142 / 84  (left arm) Cuff size:   regular  Vitals Entered By: Randell Loop CMA (October 06, 2010 12:08 PM)  O2 Sat at Rest %:  99 O2 Flow:  Room air CC: 2 month ROV & add-on for  HBP... Is Patient Diabetic? No Pain Assessment Patient in pain? no      Comments meds updated today with pt   Physical Exam  Additional Exam:  WD, WN, 65 y/o BF in NAD... GENERAL:  Alert & oriented; pleasant & cooperative... HEENT:  /AT, EOM-wnl, PERRLA, EACs-clear, TMs-wnl, NOSE-clear, THROAT-clear & wnl. NECK:  Supple w/ fairROM; no JVD; normal carotid impulses w/o bruits; no thyromegaly or nodules palpated; no lymphadenopathy. CHEST:  Clear to P & A; without wheezes/ rales/ or rhonchi heard... HEART:  Regular Rhythm; without murmurs/ rubs/ or gallops detected... ABDOMEN:  Soft & nontender; normal bowel sounds; no organomegaly or masses palpated.... EXT: without deformities, mild arthritic changes & decr ROM left knee; no varicose veins/ +venous insuffic/ 1+edema. NEURO:  CN's intact; no focal neuro deficits... DERM:  No lesions noted; no rash etc...    Impression & Recommendations:  Problem # 1:  HYPERTENSION (ICD-401.9) We decided to add LOSARTAN 50mg /d... she will monitor BP at home & at the church exerc program... ROV 32mo to recheck. Her  updated medication list for this problem includes:    Procardia Xl 60 Mg Tb24 (Nifedipine) .Marland Kitchen... Take 1 tablet by mouth once a day    Losartan Potassium 50 Mg Tabs (Losartan potassium) .Marland Kitchen... Take 1 tab by mouth once daily...    Lasix 40 Mg Tabs (Furosemide) .Marland Kitchen... Take 1 tablet by mouth once a day  Problem # 2:  ASTHMA (ICD-493.90) Stable>  no exac... Her updated medication list for this problem includes:    Symbicort 160-4.5 Mcg/act Aero (Budesonide-formoterol fumarate) ..... Use 2 puffs two times a day    Ventolin Hfa 108 (90 Base) Mcg/act Aers (Albuterol sulfate) .Marland Kitchen... 1-2 sprays every 4-6 hours as needed for wheezing...    Singulair 10 Mg Tabs (Montelukast sodium) .Marland Kitchen... Take one tablet by mouth daily...  Problem # 3:  VENOUS INSUFFICIENCY (ICD-459.81) Stable>  she knows to elim sodium, elevate legs, support hose.  Problem # 4:   HYPERCHOLESTEROLEMIA, BORDERLINE (ICD-272.4) Stable on Prav40>  continue same. Her updated medication list for this problem includes:    Pravastatin Sodium 40 Mg Tabs (Pravastatin sodium) .Marland Kitchen... Take one tablet by mouth at bedtime  Problem # 5:  OTHER MEDICAL PROBLEMS AS NOTED>>>  Complete Medication List: 1)  Omnaris 50 Mcg/act Susp (Ciclesonide) .... Use 2 sprays in each nostril daily.Marland Kitchen 2)  Xyzal 5 Mg Tabs (Levocetirizine dihydrochloride) .... Take 1 tablet by mouth once a day 3)  Symbicort 160-4.5 Mcg/act Aero (Budesonide-formoterol fumarate) .... Use 2 puffs two times a day 4)  Ventolin Hfa 108 (90 Base) Mcg/act Aers (Albuterol sulfate) .Marland Kitchen.. 1-2 sprays every 4-6 hours as needed for wheezing... 5)  Singulair 10 Mg Tabs (Montelukast sodium) .... Take one tablet by mouth daily.Marland KitchenMarland Kitchen 6)  Tussionex Pennkinetic Er 8-10 Mg/40ml Lqcr (Chlorpheniramine-hydrocodone) .... Take one teaspoon by mouth every 12 hours as needed for cough 7)  Procardia Xl 60 Mg Tb24 (Nifedipine) .... Take 1 tablet by mouth once a day 8)  Losartan Potassium 50 Mg Tabs (Losartan potassium) .... Take 1 tab by mouth once daily.Marland KitchenMarland Kitchen 9)  Lasix 40 Mg Tabs (Furosemide) .... Take 1 tablet by mouth once a day 10)  Klor-con M20 20 Meq Tbcr (Potassium chloride crys cr) .... Take 2  tablets  by mouth daily... 11)  Pravastatin Sodium 40 Mg Tabs (Pravastatin sodium) .... Take one tablet by mouth at bedtime 12)  Zegerid 20-1100 Mg Caps (Omeprazole-sodium bicarbonate) .... Take one tablet by mouth two times a day 13)  Mobic 15 Mg Tabs (Meloxicam) .... Take 1 tab by mouth once daily as needed for arthritis pain... 14)  Multivitamin/iron Tabs (Multiple vitamins-iron) .... Take 1 tablet by mouth 3 times weekly 15)  Vitamin D 1000 Unit Tabs (Cholecalciferol) .... Take one tablet by mouth 2 times weekly 16)  Allergy Vaccine  .Marland Kitchen.. 1 shot 2 times a week  Patient Instructions: 1)  Today we updated your med list- see below.... 2)  We decided to add a  new BP meds= LOSARTAN 50mg  - take one tab daily.Marland KitchenMarland Kitchen 3)  Monitor your BP progress thru the church exercise program & at home> call for questions.Marland KitchenMarland Kitchen 4)  Let's plan a 11month follow up visit... Prescriptions: LOSARTAN POTASSIUM 50 MG TABS (LOSARTAN POTASSIUM) take 1 tab by mouth once daily...  #30 x 12   Entered and Authorized by:   Michele Mcalpine MD   Signed by:   Michele Mcalpine MD on 10/06/2010   Method used:   Print then Give to Patient   RxID:   [id number]

## 2010-12-02 LAB — BASIC METABOLIC PANEL
BUN: 4 mg/dL — ABNORMAL LOW (ref 6–23)
CO2: 27 mEq/L (ref 19–32)
CO2: 28 mEq/L (ref 19–32)
Calcium: 8.3 mg/dL — ABNORMAL LOW (ref 8.4–10.5)
Chloride: 103 mEq/L (ref 96–112)
Creatinine, Ser: 0.61 mg/dL (ref 0.4–1.2)
GFR calc Af Amer: >60 mL/min (ref >60)
GFR calc non Af Amer: >60 mL/min (ref >60)
Glucose, Bld: 107 mg/dL — ABNORMAL HIGH (ref 70–99)
Sodium: 138 mEq/L (ref 135–145)

## 2010-12-02 LAB — CBC
HCT: 39.3 % (ref 36.0–46.0)
Hemoglobin: 10.2 g/dL — ABNORMAL LOW (ref 12.0–15.0)
Hemoglobin: 12.7 g/dL (ref 12.0–15.0)
MCHC: 32.3 g/dL (ref 30.0–36.0)
MCHC: 33.1 g/dL (ref 30.0–36.0)
MCV: 81.6 fL (ref 78.0–100.0)
Platelets: 188 10*3/uL (ref 150–400)
RBC: 4.82 MIL/uL (ref 3.87–5.11)
RDW: 14.9 % (ref 11.5–15.5)

## 2010-12-10 ENCOUNTER — Encounter: Payer: Self-pay | Admitting: Pulmonary Disease

## 2011-01-04 NOTE — Op Note (Signed)
NAMEARLETH, MCCULLAR              ACCOUNT NO.:  [id number]   MEDICAL RECORD NO.:  [id number]          PATIENT TYPE:  OIB   LOCATION:  9302                          FACILITY:  [facility]   PHYSICIAN:  Osborn Coho, M.D.   DATE OF BIRTH:  February 11, 1946   DATE OF PROCEDURE:  DATE OF DISCHARGE:                               OPERATIVE REPORT   PREOPERATIVE DIAGNOSES:  1. Mixed urinary incontinence.  2. Large rectocele.  3. Cystocele.   POSTOPERATIVE DIAGNOSES:  1. Mixed urinary incontinence.  2. Large rectocele.  3. Cystocele.   PROCEDURE:  1. TVT.  2. Anterior repair.  3. Posterior repair with Gynemesh.  4. Cystoscopy.   PHYSICIAN:  Osborn Coho, MD   ASSISTANT:  Marquis Lunch. Lowell Guitar, PA   ANESTHESIA:  General.   FINDINGS:  Large rectocele repaired with six bite 3.5 cm Gynemesh and  moderate cystocele, bilateral ureters effluxed on cystoscopy.   FLUIDS:  2000 mL.   URINE OUTPUT:  200 mL   ESTIMATED BLOOD LOSS:  150 mL   COMPLICATIONS:  None.   PROCEDURE:  The patient was taken to the operating room.  After the  risks, benefits, and alternatives discussed with the patient, the  patient verbalized understanding.  Consent signed and witnessed.  The  patient was placed under general anesthesia and prepped and draped in  normal sterile fashion in the dorsal lithotomy position.  A weighted  speculum was placed in the patient's vagina and the anterior vaginal  wall beneath the mid urethra injected with dilute Pitressin.  An  incision was made in the anterior vaginal wall and the vaginal wall  mucosa dissected away from the underlying tissue down to level of the  symphysis pubis bilaterally at the level of the mid urethra.  At the  mons pubis 2 incisions were made approximately two fingerbreadths from  the midline extending approximately 5 mm each.  A rigid urethral  catheter guide was placed and an 18-French Foley into the urethra and  bladder and deflected to the patient's right.   The transabdominal guide  was passed from the incision on the mons pubis on the ipsilateral side  down through the incision in the anterior vaginal wall.  The same was  done on the contralateral side.  The transabdominal guides were each  passed off the space of Retzius.  Cystoscopy was performed and no  inadvertent bladder injury was noted.  The mesh was then attached to the  bilateral transabdominal guides after emptying the bladder completely  and placed in the rigid urethral catheter guide.  Once again deflecting  into the patient's right, the mesh was elevated up through the space of  Retzius and out through the incision on the mons pubis.  The same was  done on the contralateral side.  Indigo carmine was administered and  cystoscopy performed and no inadvertent bladder injury was noted and  bilateral ureters were noted to efflux without difficulty.  A large  Tresa Endo was placed between the mid urethra and the mesh in order to leave  the mesh slack and the sheath was removed bilaterally.  This was done  while the Foley was in the bladder to straight drain to gravity.  The  incision was then repaired with interrupted stitches of 2-0 Vicryl.  The  mesh was cut, flushed with the incisions on the mons pubis bilaterally  and the incisions repaired with Dermabond.  The cystocele was identified  and the vaginal wall injected with dilute Pitressin over the cystocele.  An incision was made over the cystocele and the vaginal wall mucosa  dissected away from the underlying tissue.  Kelly plication stitches  were placed in order to reduce the cystocele and the vaginal wall mucosa  was repaired with 2-0 Vicryl via interrupted stitches.  Attention was  then turned to the posterior vaginal wall where dilute Pitressin was  injected and large rectocele was noted.  An incision was made and the  posterior vaginal wall mucosa was dissected away from the underlying  tissue and mesh cut with measurements of  6 x 3.5 cm and left along at  the angles placed anteriorly.  The mesh was sutured down using 3-0  Vicryl approximately 4 or 5 interrupted stitches.  The vaginal wall  mucosa was then repaired with interrupted stitches of 2-0 Vicryl and a  subcuticular stitch was placed at the perineum of 2-0 Vicryl.  The  vagina was packed with 2-inch plain packing soaked with estrogen cream.  Sponge, lap, and needle count was correct.  The patient tolerated the  procedure well and was returned to the recovery room in good condition.      Osborn Coho, M.D.  Electronically Signed     AR/MEDQ  D:  11/05/2008  T:  11/06/2008  Job:  [id number]

## 2011-01-04 NOTE — H&P (Signed)
Wendy Figueroa, Wendy Figueroa              ACCOUNT NO.:  [id number]   MEDICAL RECORD NO.:  [id number]          PATIENT TYPE:  AMB   LOCATION:  SDC                           FACILITY:  [facility]   PHYSICIAN:  Osborn Coho, M.D.   DATE OF BIRTH:  Sep 02, 1945   DATE OF ADMISSION:  DATE OF DISCHARGE:                              HISTORY & PHYSICAL   HISTORY OF PRESENT ILLNESS:  Wendy Figueroa is a 65 year old married  African American female para 2-0-0-2 who is status post hysterectomy  presenting for placement of tension-free vaginal tape along with  anterior-posterior repair using Gynemesh because of mixed urinary  incontinence.  For over 2 years, the patient has complained of frequent  voiding and urinary urgency.  The patient underwent cystometrics in July  2008 and was found to have urinary incontinence with urge predominance  along with intrinsic sphincter dysfunction.  The patient presents now  for surgical management of her symptoms.   OBSTETRIC HISTORY:  Gravida 2, para 2-0-0-2.  The patient has 2  spontaneous vaginal births.   GYNECOLOGIC HISTORY:  Menarche 65 years old.  The patient has undergone  a hysterectomy.  She has a remote history of abnormal Pap smears.  Denies any history of sexually transmitted diseases.  Her last normal  Pap smear was January 2010.   MEDICAL HISTORY:  Asthma, seasonal allergies, hypertension,  gastroesophageal reflux disease.   SURGICAL HISTORY:  1988 hysterectomy.  The patient also has undergone  bilateral breast biopsies, returning benign pathology.   FAMILY HISTORY:  Lung cancer and aneurysm.   SOCIAL HISTORY:  The patient is retired.  She is married and lives at  home with her husband.   HABITS:  She denies tobacco use.  She does admit to occasional beer  consumption.  She denies any illicit drug use.   CURRENT MEDICATIONS:  1. Reglan 10 mg every 6 hours as needed.  2. K-Dur 40 mEq daily.  3. Lasix 40 mg daily.  4. Procardia XL 60 mg daily.  5.  Symbicort 160/4.5 two puffs daily.  6. Zegerid 1 tablet twice daily.  7. Singulair 10 mg daily.  8. Calcium 600 mg twice daily.  9. Xyzal 5 mg daily.   ALLERGIES:  The patient is allergic to PENICILLIN, DEMEROL, ZESTORETIC  (LISINOPRIL/HYDROCHLOROTHIAZIDE).   REVIEW OF SYSTEMS:  The patient does admit to chronic cough which is  better controlled with her most recent asthma medications.  Denies any  chest pain, shortness of breath, fever, headache, vision changes,  nausea, vomiting, diarrhea, dysuria, hematuria, or changes in bowel  habits or pelvic pain, and except as is mentioned in history of present  illness, the patient's review of systems is otherwise negative.   PHYSICAL EXAMINATION:  VITAL SIGNS:  Blood pressure is 160/90, weight is  209, height is 5 feet 3-1/4 inches tall, pulse is 72, respirations 20,  temperature 97.9 degrees Fahrenheit orally.  EARS, NOSE, AND THROAT:  Pupils are equal.  Hearing normal.  Throat  clear.  HEART:  Regular rate and rhythm.  LUNGS:  Clear.  PELVIC:  EG/BUS is normal.  Vagina is  normal with the exception of a  small cystocele.  Uterus and cervix are surgically absent.  Adnexa  without tenderness or masses.  Rectovaginal revealed 2+ rectocele.   IMPRESSION:  Mixed urinary incontinence.   DISPOSITION:  A discussion was held with the patient regarding the  indications for her procedure along with its risks which include, but  are not limited to, reaction to anesthesia, damage to adjacent organs,  infection, excessive bleeding, erosion of mesh, and worsening of her  incontinence symptoms.  The patient verbalized understanding of these  risks and has consented to proceed with placement of tension-free  vaginal tape along with anterior and posterior colporrhaphy utilizing  Gynemesh at [facility] [place] on November 05, 2008, at 9:30  a.m.      [place] J. Adline Peals.      Osborn Coho, M.D.  Electronically Signed    EJP/MEDQ   D:  11/04/2008  T:  11/05/2008  Job:  [id number]

## 2011-01-07 NOTE — Procedures (Signed)
[facility]  Patient:    Wendy Figueroa, Wendy Figueroa Visit Number: [id number] MRN: [id number]          Service Type: END Location: ENDO Attending Physician:  Mervin Hack Dictated by:   Hedwig Morton. Juanda Chance, M.D. [facility] Admit Date:  04/24/2001   CC:         Thad Ranger, M.D  Sidney Ace, M.D. [facility]   Procedure Report  PROCEDURE:  Upper endoscopy.  INDICATIONS:  This 65 year old African-American female has asthmatic bronchitis and has been treated for allergy by Dr. Kittitas Callas.  She was evaluated by Dr. Lucie Leather because of coughing spells, hoarseness, and was found to have possible reflux induced laryngitis.  She has been off and on steroids for her asthmatic bronchitis.  Her cough occurs at night as well as during the day.  Reglan has helped the cough.  She was initially on Zantac and most recently on Prevacid.  She has been about 40% improved on the medications. She does not smoke and does not drink.  She is undergoing upper endoscopy to assess her for reflux esophagitis.  INSTRUMENT:  Fujinon single-channel endoscope.  SEDATION:  Versed 5 mg IV, fentanyl 25 mcg IV.  DESCRIPTION OF PROCEDURE:  The Fujinon single-channel endoscope was passed routinely through the posterior pharynx into the esophagus.  The patient was monitored by pulse oximetry.  Her oxygen saturations were normal. She was very cooperative.  Proximal mid esophageal mucosa was unremarkable. There was no evidence of chronic or acute esophagitis.  Squamocolumnar junction was located at 30 cm from the incisors, and there was a reducible hiatal hernia which measured 2-3 cm.  There was no stricture, no active erosions.  The stomach was insufflated with air and, as the endoscope passed into the stomach it reduced the hiatal hernia to the point where the squamocolumnar junction was on the level of the diaphragm.  The gastric folds were normal except in the mid greater curvature was a circular  fold, almost appearing as a doughnut shape, with central indentation.  It was soft, glistening, and had the color of surrounding mucosa.  It might have represented pancreatic ______ or possibly a normal circular fold.  Multiple biopsies of this fold were obtained.  The gastric antrum and pyloric outlet were normal.  Duodenum:  Duodenal bulb and descending duodenum were unremarkable.  A CLOtest was taken from the gastric antrum.  The patient tolerated the procedure well.  IMPRESSION: 1. Essentially normal upper endoscopy of esophagus, stomach, and duodenum,    with a small sliding hiatal hernia. 2. 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  PLAN:  The patient is currently scheduled for 24-hour intra-esophageal pH probe to quantitate her reflux.  She does not show any changes of the chronic reflux in her esophagus such as stricture, esophagitis, but her hiatal hernia certainly could dispose her to nocturnal reflux and cough.  Depending on the outcome of the 24-hour pH probe, she may be a candidate for Nissen fundoplication. Dictated by:   Hedwig Morton. Juanda Chance, M.D. [facility] Attending Physician:  Mervin Hack DD:  04/24/01 TD:  04/24/01 Job: [id number]

## 2011-01-07 NOTE — Procedures (Signed)
[facility]  Patient:    Wendy Figueroa, Wendy Figueroa Visit Number: [id number] MRN: [id number]          Service Type: END Location: ENDO Attending Physician:  Mervin Hack Proc. Date: 04/24/01 Admit Date:  04/24/2001   CC:         Thayer Headings, M.D.   Procedure Report  PROCEDURE:  Colonoscopy with biopsy.  INDICATION FOR PROCEDURE:  Heme positive stools, rectal bleeding and anemia.  DESCRIPTION OF PROCEDURE:  The patient was placed in the left lateral decubitus position then placed on the pulse monitor with continuous low flow oxygen delivered by nasal cannula. He was sedated with 100 mg IV Demerol and 10 mg IV Versed. The Olympus video colonoscope was inserted into the rectum and advanced to the cecum, confirmed by transillumination at McBurneys point and visualization of the ileocecal valve and appendiceal orifice. The prep was excellent. The cecum, ascending, transverse, and descending colon appeared normal with no masses, polyps, diverticula or other mucosal abnormalities. Within the sigmoid colon, there were some patchy areas of erythema and granularity with no obvious edema questionable for representing true inflammation although the erythema was somewhat intense in places. Biopsies were taken of the most visibly abnormal areas. The erythematous area faded to normal mucosa around 20 cm and the rectum distal to this appeared normal with the exception of some moderate size internal hemorrhoids seen on retroflexed view. The colonoscope was then withdrawn and the patient returned to the recovery room in stable condition. The patient tolerated the procedure well and there were no immediate complications.  IMPRESSION: 1. Possible sigmoid colitis. 2. Internal hemorrhoids.  PLAN:  Based on the severity of anemia and uncertainty as to presence of true colitis will proceed with EGD with and without small biopsy depending on the findings and will  also await sigmoid biopsies. Attending Physician:  Mervin Hack DD:  04/24/01 TD:  04/24/01 Job: [phone]

## 2011-02-11 ENCOUNTER — Telehealth: Payer: Self-pay | Admitting: Pulmonary Disease

## 2011-02-11 MED ORDER — HYDROCOD POLST-CHLORPHEN POLST 10-8 MG/5ML PO LQCR: 5.0000 mL | Freq: Two times a day (BID) | ORAL | Status: DC

## 2011-02-11 NOTE — Telephone Encounter (Signed)
lmomtcb  

## 2011-03-09 ENCOUNTER — Encounter: Payer: Self-pay | Admitting: Podiatry

## 2011-07-21 ENCOUNTER — Telehealth: Payer: Self-pay | Admitting: Pulmonary Disease

## 2011-07-21 NOTE — Telephone Encounter (Signed)
Called and spoke with pt and she is aware that her last cpx was 07/2010 and yearly follow up has been scheduled for pt on 08/26/2011 at 12 and pt is aware of appt date and time.

## 2011-08-25 ENCOUNTER — Telehealth: Payer: Self-pay | Admitting: Pulmonary Disease

## 2011-08-25 DIAGNOSIS — I10 Essential (primary) hypertension: Secondary | ICD-10-CM

## 2011-08-25 DIAGNOSIS — F419 Anxiety disorder, unspecified: Secondary | ICD-10-CM

## 2011-08-25 DIAGNOSIS — E785 Hyperlipidemia, unspecified: Secondary | ICD-10-CM

## 2011-08-25 DIAGNOSIS — D126 Benign neoplasm of colon, unspecified: Secondary | ICD-10-CM

## 2011-08-25 NOTE — Telephone Encounter (Signed)
lmomtcb for pt to make her aware of labs in computer for tomorrow am.

## 2011-08-25 NOTE — Telephone Encounter (Signed)
Please advise what labs pt will need to have done Dr. Kriste Basque, thanks

## 2011-08-26 ENCOUNTER — Other Ambulatory Visit (INDEPENDENT_AMBULATORY_CARE_PROVIDER_SITE_OTHER): Payer: Medicare Other

## 2011-08-26 ENCOUNTER — Encounter: Payer: Self-pay | Admitting: Pulmonary Disease

## 2011-08-26 ENCOUNTER — Ambulatory Visit (INDEPENDENT_AMBULATORY_CARE_PROVIDER_SITE_OTHER): Payer: Medicare Other | Admitting: Pulmonary Disease

## 2011-08-26 DIAGNOSIS — K5730 Diverticulosis of large intestine without perforation or abscess without bleeding: Secondary | ICD-10-CM

## 2011-08-26 DIAGNOSIS — M1990 Unspecified osteoarthritis, unspecified site: Secondary | ICD-10-CM

## 2011-08-26 DIAGNOSIS — J45909 Unspecified asthma, uncomplicated: Secondary | ICD-10-CM

## 2011-08-26 DIAGNOSIS — I10 Essential (primary) hypertension: Secondary | ICD-10-CM

## 2011-08-26 DIAGNOSIS — F419 Anxiety disorder, unspecified: Secondary | ICD-10-CM

## 2011-08-26 DIAGNOSIS — D126 Benign neoplasm of colon, unspecified: Secondary | ICD-10-CM

## 2011-08-26 DIAGNOSIS — E663 Overweight: Secondary | ICD-10-CM

## 2011-08-26 DIAGNOSIS — J309 Allergic rhinitis, unspecified: Secondary | ICD-10-CM

## 2011-08-26 DIAGNOSIS — K219 Gastro-esophageal reflux disease without esophagitis: Secondary | ICD-10-CM

## 2011-08-26 DIAGNOSIS — E785 Hyperlipidemia, unspecified: Secondary | ICD-10-CM

## 2011-08-26 DIAGNOSIS — F411 Generalized anxiety disorder: Secondary | ICD-10-CM

## 2011-08-26 DIAGNOSIS — G4700 Insomnia, unspecified: Secondary | ICD-10-CM

## 2011-08-26 DIAGNOSIS — I872 Venous insufficiency (chronic) (peripheral): Secondary | ICD-10-CM

## 2011-08-26 LAB — HEPATIC FUNCTION PANEL
ALT: 20 U/L (ref 0–35)
Albumin: 3.9 g/dL (ref 3.5–5.2)
Alkaline Phosphatase: 152 U/L — ABNORMAL HIGH (ref 39–117)
Bilirubin, Direct: 0.1 mg/dL (ref 0.0–0.3)
Total Protein: 7.5 g/dL (ref 6.0–8.3)

## 2011-08-26 LAB — LIPID PANEL
HDL: 63.3 mg/dL (ref >39.00)
Total CHOL/HDL Ratio: 3
Triglycerides: 38 mg/dL (ref 0.0–149.0)

## 2011-08-26 LAB — CBC WITH DIFFERENTIAL/PLATELET
Basophils Absolute: 0 10*3/uL (ref 0.0–0.1)
Eosinophils Absolute: 0.2 10*3/uL (ref 0.0–0.7)
Eosinophils Relative: 3.5 % (ref 0.0–5.0)
Hemoglobin: 12 g/dL (ref 12.0–15.0)
Lymphocytes Relative: 40.3 % (ref 12.0–46.0)
Lymphs Abs: 2.4 10*3/uL (ref 0.7–4.0)
MCHC: 32.8 g/dL (ref 30.0–36.0)
Monocytes Relative: 9.3 % (ref 3.0–12.0)
Neutro Abs: 2.7 10*3/uL (ref 1.4–7.7)
Neutrophils Relative %: 46.3 % (ref 43.0–77.0)
RBC: 4.42 Mil/uL (ref 3.87–5.11)
RDW: 14.4 % (ref 11.5–14.6)
WBC: 5.9 10*3/uL (ref 4.5–10.5)

## 2011-08-26 LAB — BASIC METABOLIC PANEL
CO2: 26 mEq/L (ref 19–32)
Chloride: 111 mEq/L (ref 96–112)
Creatinine, Ser: 0.8 mg/dL (ref 0.4–1.2)
Glucose, Bld: 100 mg/dL — ABNORMAL HIGH (ref 70–99)

## 2011-08-26 MED ORDER — POTASSIUM CHLORIDE CRYS ER 20 MEQ PO TBCR: 40.0000 meq | EXTENDED_RELEASE_TABLET | Freq: Every day | ORAL | Status: DC

## 2011-08-26 MED ORDER — LOSARTAN POTASSIUM 50 MG PO TABS: 50.0000 mg | ORAL_TABLET | Freq: Every day | ORAL | Status: DC

## 2011-08-26 MED ORDER — HYDROCOD POLST-CHLORPHEN POLST 10-8 MG/5ML PO LQCR: 5.0000 mL | Freq: Two times a day (BID) | ORAL | Status: DC

## 2011-08-26 MED ORDER — FUROSEMIDE 40 MG PO TABS: 40.0000 mg | ORAL_TABLET | Freq: Every day | ORAL | Status: DC

## 2011-08-26 MED ORDER — FLUOCINONIDE-E 0.05 % EX CREA: TOPICAL_CREAM | Freq: Two times a day (BID) | CUTANEOUS | Status: DC

## 2011-08-26 MED ORDER — PRAVASTATIN SODIUM 40 MG PO TABS: 40.0000 mg | ORAL_TABLET | Freq: Every day | ORAL | Status: DC

## 2011-08-26 MED ORDER — NIFEDIPINE ER OSMOTIC RELEASE 60 MG PO TB24: 60.0000 mg | ORAL_TABLET | Freq: Every day | ORAL | Status: DC

## 2011-08-26 NOTE — Telephone Encounter (Signed)
Called and spoke with pt and she is aware of labs in the computer for her.

## 2011-08-26 NOTE — Patient Instructions (Signed)
Today we updated your med list in our EPIC system...    Continue your current medications the same...    We refilled your meds as requested...  We reviewed your blood work & everything looks good...  Try the Lidex-E cream on your rash twice daily & let me know if you need a referral to a Dermatologist..Marland Kitchen

## 2011-08-28 ENCOUNTER — Encounter: Payer: Self-pay | Admitting: Pulmonary Disease

## 2011-08-28 NOTE — Progress Notes (Signed)
Subjective:     Patient ID: Wendy Figueroa, female   DOB: 1945-10-13, 66 y.o.   MRN: [id number]  HPI 66 y/o BF here for a yearly follow up and med refills... he has multiple medical problems as noted below...   ~  July 29, 2010:  Yearly ROV- doing reasonably well w/o new complaints or concerns... she continues to be followed by DrSharma on meds below... BP remains controlled on Procardia & Lasix... Chol has been OK on Prav40 & she will ret for FLP... she knows that she needs to do better re her weight & we reviewed diet + exercise... she saw DrGioffre 11/10 & was given a knee injection, Mobic helps some- refilled...  ~  October 06, 2010:  Add-on appt due to elev BP readings at church exercise program> she brings list w/ recent BP 140's-170/ 90's;  feeling well w/o HA, visual symptoms, CP, palpit, edema, etc;  reminded about diet- low sodium, low cal, etc;  we decided to add LOSARTAN 50mg /d.    Asthma, allergies, Lipids, GI- all stable & doing satis per pt...  ~  August 26, 2011:  53mo ROV & here "for my yearly check-up"> she reports persist cough- irritative, no sputum, no hemoptysis, no CP, no ch in chr DOE etc;  She even had an episode of post tussive syncope (no injury) & we discussed this, plus the need for TUSSIONEX Q12H as needed...  She notes rash on legs> ?etiology & we discussed trial Lidex-E cream & rec Derm eval if not resolved...  She's had the 2012 Flu vaccine, had fasting labs done today, needs refills...     AR> on Xyzal5, Singulair10, Omnaris, & allergy shots from DrSharma; seen 8/12 & his note is reviewed...    Asthma> on Symbicort160, Spiriva, NEBS w/ Albut, & Tussionex; she had an infectious exac 8/12 & saw DrSharma who gave her Depo/ Pred/ ZPak & improved...    HBP> on ProcXL60, Losar50, Lasix40, K20; BP= 136/80 & she denies CP, palpit, dizzy, ch in SOB/DOE, edema...    Chol> on Prav40 + FishOil; FLP looks good on diet + Rx- continue same...    Overweight> wt up 11# to 220#  today; we reviewed diet, exercise, wt reduction strategies...    GI> GERD, Divertics, Polyps, Elev AlkPhos> on Zegerid & followed by DrDBrodie; last colonoscopy was 7/06 & follow up planned 54yrs.    DJD> on Mobic15 as needed; she has been seen by DrGioffre in the past w/ shot in the left knee which helped...          Problem List:  ALLERGIC RHINITIS (ICD-477.9) - followed by DrSharma on shots weekly... also takes OMNARIS 2sp in each nostril daily,  SINGULAIR 10mg /d,  XYZAL 5mg /d... prev allergy tests + for trees, grass, ragweed, molds, dust, cat>dog, etc...  Hx of SINUSITIS (ICD-473.9) - no recent infections or antibiotics required.  ASTHMA (ICD-493.90) - Former pt of DrSlotnick yrs ago, also eval by DrKozlow & DrSane at [facility]... stable on SYMBICORT 160 2spBid, SPIRIVA daily, & VENTOLIN HFA as needed (DrSharma prescribes these meds)... she has min cough, small amt clear sputum and no recent wheezing, increased dyspnea, chest pain, etc...  HYPERTENSION (ICD-401.9) - on PROCARDIA XL 60mg /d,  LOSARTAN50, LASIX 40mg /d,  KCl - 2/d... ~  2/12:  add-on for elev BP at church exerc program> we added LOSARTAN 50mg /d. ~  1/13:  BP= 136/80 and she denies HA, fatigue, visual changes, CP, palipit, dizziness, syncope, dyspnea, edema, etc...  VENOUS INSUFFICIENCY (  ICD-459.81) - she follows low sodium diet, elevates legs, wears support hose, and takes the Lasix...  HYPERCHOLESTEROLEMIA, BORDERLINE (ICD-272.4) - on PRAVASTATIN 40mg /d + low fat diet... ~  FLP 5/07 showed TChol 190, TG 68, HDL 56, LDL 120 ~  FLP 7/09 showed TChol 202, TG 53, HDL 52, LDL 141... rec> add Prav40. ~  FLP 11/10 on Prav40 showed TChol 171, TG 70, HDL 57, LDL 100 ~  FLP 12/11 on Prav40 showed TChol 150, TG 36, HDL 56, LDL 87 ~  FLP 1/13 on Prav40 showed TChol 162, TG 38, HDL 63, LDL 91  OVERWEIGHT (ICD-278.02) - discussed low carb/ low fat diet and increased exercise program... ~  weight 7/09 is down 5# to 203#... she knows that  she needs to do better... ~  weight 11/10 = 212# ~  weight 12/11 = 215# ~  weight 2/12 = 209# ~  Weight 1/13 = 220# & we reviewed low carb, low fat, wt reducing diet!  GERD (ICD-530.81) - on ZEGERID Bid, & off prev reglan rx... EGD 9/02 showed sliding HH, mild gastritis, neg biopsy...  DIVERTICULOSIS OF COLON (ICD-562.10) + COLONIC POLYPS (ICD-211.3) - last colonoscopy 7/06 by DrDBrodie showed extensive divertics, spasm, redund colon, hems, and sm polyp (no path avail)...  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 she never ret for the GGT. ~  labs 11/10 showed AlkPhos= 195 ( 39-117), GGT= 54 (7-51)... continue to follow. ~  labs 12/11 showed AlkPhos= 158 ~  Labs 1/13 showed AlkPhos= 152  DEGENERATIVE JOINT DISEASE (ICD-715.90) ~  11/10: c/o left knee pain & XRay shows joint effusion- trial MOBIC 15mg /d & refer to Ortho> seen by DrGioffre & given knee injection.  Health Maintenance -  GYN= DrARoberts... BMD at [facility] office 2005 was WNL w/ TScores -0.9 to +1.1.Marland KitchenMarland Kitchen Mammograms neg at [facility] 2/11... s/p hyst, and A/P repair by DrRoberts 3/10... she had PNEUMOVAX in 2006 (age58) & repeated 12/11 (age64).   Past Surgical History  Procedure Date  . Abdominal hysterectomy   . [place]og[place] / perigee repair 10/2008    Dr. Lance Morin    Outpatient Encounter Prescriptions as of 08/26/2011  Medication Sig Dispense Refill  . albuterol (PROVENTIL HFA;VENTOLIN HFA) 108 (90 BASE) MCG/ACT inhaler Inhale 2 puffs into the lungs every 6 (six) hours as needed.        . budesonide-formoterol (SYMBICORT) 160-4.5 MCG/ACT inhaler Inhale 2 puffs into the lungs 2 (two) times daily.        . chlorpheniramine-HYDROcodone (TUSSIONEX PENNKINETIC ER) 10-8 MG/5ML LQCR Take 5 mLs by mouth every 12 (twelve) hours.  120 mL  5  . cholecalciferol (VITAMIN D) 1000 UNITS tablet Take 1,000 Units by mouth 2 (two) times daily.        . ciclesonide (OMNARIS) 50 MCG/ACT nasal spray Place  2 sprays into both nostrils daily.        . fish oil-omega-3 fatty acids 1000 MG capsule Take 1 g by mouth daily.        . furosemide (LASIX) 40 MG tablet Take 1 tablet (40 mg total) by mouth daily.  90 tablet  3  . levocetirizine (XYZAL) 5 MG tablet Take 5 mg by mouth every evening.        Marland Kitchen losartan (COZAAR) 50 MG tablet Take 1 tablet (50 mg total) by mouth daily.  90 tablet  3  . meloxicam (MOBIC) 15 MG tablet Take 15 mg by mouth daily.        Marland Kitchen  montelukast (SINGULAIR) 10 MG tablet Take 10 mg by mouth at bedtime.        . Multiple Vitamins-Iron (MULTIVITAMINS WITH IRON) TABS Take 1 tablet by mouth three times per week       . Multiple Vitamins-Minerals (MACULAR VITAMIN BENEFIT PO) Take 1 capsule by mouth 2 (two) times daily.        Marland Kitchen NIFEdipine (PROCARDIA XL/ADALAT-CC) 60 MG 24 hr tablet Take 1 tablet (60 mg total) by mouth daily.  90 tablet  3  . Omeprazole-Sodium Bicarbonate (ZEGERID) 20-1100 MG CAPS Take 1 capsule by mouth 2 (two) times daily.        . potassium chloride SA (K-DUR,KLOR-CON) 20 MEQ tablet Take 2 tablets (40 mEq total) by mouth daily.  90 tablet  3  . pravastatin (PRAVACHOL) 40 MG tablet Take 1 tablet (40 mg total) by mouth daily.  90 tablet  3  . fluocinonide-emollient (LIDEX-E) 0.05 % cream Apply topically 2 (two) times daily.  30 g  3    Allergies  Allergen Reactions  . Lisinopril-Hydrochlorothiazide   . Meperidine Hcl   . Penicillins     Current Medications, Allergies, Past Medical History, Past Surgical History, Family History, and Social History were reviewed in [employer] record.    Review of Systems         See HPI - all other systems neg except as noted... The patient complains of dyspnea on exertion.  The patient denies anorexia, fever, weight loss, weight gain, vision loss, decreased hearing, hoarseness, chest pain, syncope, peripheral edema, prolonged cough, headaches, hemoptysis, abdominal pain, melena, hematochezia, severe  indigestion/heartburn, hematuria, incontinence, muscle weakness, suspicious skin lesions, transient blindness, difficulty walking, depression, unusual weight change, abnormal bleeding, enlarged lymph nodes, and angioedema.   Objective:   Physical Exam     WD, WN, 66 y/o BF in NAD... GENERAL:  Alert & oriented; pleasant & cooperative... HEENT:  [place]/AT, EOM-wnl, PERRLA, EACs-clear, TMs-wnl, NOSE-clear, THROAT-clear & wnl. NECK:  Supple w/ fairROM; no JVD; normal carotid impulses w/o bruits; no thyromegaly or nodules palpated; no lymphadenopathy. CHEST:  Clear to P & A; without wheezes/ rales/ or rhonchi heard... HEART:  Regular Rhythm; without murmurs/ rubs/ or gallops detected... ABDOMEN:  Soft & nontender; normal bowel sounds; no organomegaly or masses palpated.... EXT: without deformities, mild arthritic changes & decr ROM left knee; no varicose veins/ +venous insuffic/ 1+edema. NEURO:  CN's intact; no focal neuro deficits... DERM:  No lesions noted; no rash etc...  RADIOLOGY DATA:  Reviewed in the EPIC EMR & discussed w/ the patient...    >>Last CXR 11/10 showed clear lungs, sl incr markings, ectasia of Ao, NAD...  LABORATORY DATA:  Reviewed in the EPIC EMR & discussed w/ the patient...    >>Fasting labs done 08/26/11 & reviewed w/ the pt...   Assessment:     AR>  On allergy shots & meds from DrSharma; continue same...  Asthma>  On meds as listed & regimen primarily from DrSharma; his notes are reviewed...  HBP>  BP stable on meds; advised low sodium diet, take meds regularly, etc...  Chol> on Prav40 + FishOil; FLP looks good on diet + Rx- continue same...  Overweight> wt up 11# to 220# today; we reviewed diet, exercise, wt reduction strategies...  GI> GERD, Divertics, Polyps, Elev AlkPhos> on Zegerid & followed by DrDBrodie; last colonoscopy was 7/06 & follow up planned 27yrs.  DJD> on Mobic15 as needed; she has been seen by DrGioffre in the past w/ shot  in the left knee which  helped...     Plan:     Patient's Medications  New Prescriptions   FLUOCINONIDE-EMOLLIENT (LIDEX-E) 0.05 % CREAM    Apply topically 2 (two) times daily.  Previous Medications   ALBUTEROL (PROVENTIL HFA;VENTOLIN HFA) 108 (90 BASE) MCG/ACT INHALER    Inhale 2 puffs into the lungs every 6 (six) hours as needed.     BUDESONIDE-FORMOTEROL (SYMBICORT) 160-4.5 MCG/ACT INHALER    Inhale 2 puffs into the lungs 2 (two) times daily.     CHOLECALCIFEROL (VITAMIN D) 1000 UNITS TABLET    Take 1,000 Units by mouth 2 (two) times daily.     CICLESONIDE (OMNARIS) 50 MCG/ACT NASAL SPRAY    Place 2 sprays into both nostrils daily.     FISH OIL-OMEGA-3 FATTY ACIDS 1000 MG CAPSULE    Take 1 g by mouth daily.     LEVOCETIRIZINE (XYZAL) 5 MG TABLET    Take 5 mg by mouth every evening.     MELOXICAM (MOBIC) 15 MG TABLET    Take 15 mg by mouth daily.     MONTELUKAST (SINGULAIR) 10 MG TABLET    Take 10 mg by mouth at bedtime.     MULTIPLE VITAMINS-IRON (MULTIVITAMINS WITH IRON) TABS    Take 1 tablet by mouth three times per week    MULTIPLE VITAMINS-MINERALS (MACULAR VITAMIN BENEFIT PO)    Take 1 capsule by mouth 2 (two) times daily.     OMEPRAZOLE-SODIUM BICARBONATE (ZEGERID) 20-1100 MG CAPS    Take 1 capsule by mouth 2 (two) times daily.    Modified Medications   Modified Medication Previous Medication   CHLORPHENIRAMINE-HYDROCODONE (TUSSIONEX PENNKINETIC ER) 10-8 MG/5ML LQCR chlorpheniramine-HYDROcodone (TUSSIONEX PENNKINETIC ER) 10-8 MG/5ML LQCR      Take 5 mLs by mouth every 12 (twelve) hours.    Take 5 mLs by mouth every 12 (twelve) hours.   FUROSEMIDE (LASIX) 40 MG TABLET furosemide (LASIX) 40 MG tablet      Take 1 tablet (40 mg total) by mouth daily.    Take 40 mg by mouth daily.     LOSARTAN (COZAAR) 50 MG TABLET losartan (COZAAR) 50 MG tablet      Take 1 tablet (50 mg total) by mouth daily.    Take 50 mg by mouth daily.     NIFEDIPINE (PROCARDIA XL/ADALAT-CC) 60 MG 24 HR TABLET NIFEdipine (PROCARDIA  XL/ADALAT-CC) 60 MG 24 hr tablet      Take 1 tablet (60 mg total) by mouth daily.    Take 60 mg by mouth daily.     POTASSIUM CHLORIDE SA (K-DUR,KLOR-CON) 20 MEQ TABLET potassium chloride SA (K-DUR,KLOR-CON) 20 MEQ tablet      Take 2 tablets (40 mEq total) by mouth daily.    Take 40 mEq by mouth daily.     PRAVASTATIN (PRAVACHOL) 40 MG TABLET pravastatin (PRAVACHOL) 40 MG tablet      Take 1 tablet (40 mg total) by mouth daily.    Take 40 mg by mouth daily.    Discontinued Medications   No medications on file

## 2011-08-29 ENCOUNTER — Telehealth: Payer: Self-pay | Admitting: Pulmonary Disease

## 2011-08-29 NOTE — Telephone Encounter (Signed)
Will forward to Leigh 

## 2011-08-30 MED ORDER — POTASSIUM CHLORIDE CRYS ER 20 MEQ PO TBCR: 40.0000 meq | EXTENDED_RELEASE_TABLET | Freq: Every day | ORAL | Status: DC

## 2011-08-30 MED ORDER — LOSARTAN POTASSIUM 50 MG PO TABS: 50.0000 mg | ORAL_TABLET | Freq: Every day | ORAL | Status: DC

## 2011-08-30 MED ORDER — NIFEDIPINE ER OSMOTIC RELEASE 60 MG PO TB24: 60.0000 mg | ORAL_TABLET | Freq: Every day | ORAL | Status: DC

## 2011-08-30 MED ORDER — PRAVASTATIN SODIUM 40 MG PO TABS: 40.0000 mg | ORAL_TABLET | Freq: Every day | ORAL | Status: DC

## 2011-08-30 MED ORDER — FUROSEMIDE 40 MG PO TABS: 40.0000 mg | ORAL_TABLET | Freq: Every day | ORAL | Status: DC

## 2011-08-30 NOTE — Telephone Encounter (Signed)
Called and lm with family for pt to call me back.

## 2011-08-30 NOTE — Telephone Encounter (Signed)
rx have been signed and faxed back to [facility] per pts request.

## 2011-08-30 NOTE — Telephone Encounter (Signed)
Pt has new mail order pharmacy and needs new rx faxed to [facility].  rx have been printed out and waiting on SN to sign and will fax these in

## 2011-08-30 NOTE — Telephone Encounter (Signed)
PT RETURNED CALL . Wendy Figueroa  °

## 2011-09-06 ENCOUNTER — Encounter: Payer: Self-pay | Admitting: Pulmonary Disease

## 2011-11-15 ENCOUNTER — Ambulatory Visit (INDEPENDENT_AMBULATORY_CARE_PROVIDER_SITE_OTHER): Payer: Medicare Other | Admitting: Pulmonary Disease

## 2011-11-15 ENCOUNTER — Encounter: Payer: Self-pay | Admitting: Pulmonary Disease

## 2011-11-15 VITALS — BP 142/88 | HR 86 | Temp 97.8°F | Ht 63.0 in | Wt 218.4 lb

## 2011-11-15 DIAGNOSIS — J45909 Unspecified asthma, uncomplicated: Secondary | ICD-10-CM

## 2011-11-15 DIAGNOSIS — L299 Pruritus, unspecified: Secondary | ICD-10-CM

## 2011-11-15 DIAGNOSIS — L089 Local infection of the skin and subcutaneous tissue, unspecified: Secondary | ICD-10-CM

## 2011-11-15 DIAGNOSIS — J309 Allergic rhinitis, unspecified: Secondary | ICD-10-CM

## 2011-11-15 DIAGNOSIS — L723 Sebaceous cyst: Secondary | ICD-10-CM

## 2011-11-15 DIAGNOSIS — I10 Essential (primary) hypertension: Secondary | ICD-10-CM

## 2011-11-15 MED ORDER — HYDROXYZINE HCL 25 MG PO TABS: 25.0000 mg | ORAL_TABLET | Freq: Four times a day (QID) | ORAL | Status: DC | PRN

## 2011-11-15 MED ORDER — DOXYCYCLINE HYCLATE 100 MG PO CAPS: 100.0000 mg | ORAL_CAPSULE | Freq: Two times a day (BID) | ORAL | Status: AC

## 2011-11-15 NOTE — Patient Instructions (Signed)
Today we updated your med list in our EPIC system...    Continue your current medications the same...  For the infected sebaceous cyst on you back:    Use some warm compresses...    Take the Doxycycline 100mg  twice daily for 10 days...    We will set up an appt w/ the general Surgeons for excision of this cyst...  For your itching> try the Atarax one tab every 6H as needed...  Call for any questions.Marland KitchenMarland Kitchen

## 2011-11-15 NOTE — Progress Notes (Addendum)
Subjective:     Patient ID: Wendy Figueroa, female   DOB: 02-06-1946, 66 y.o.   MRN: [id number]  HPI 66 y/o BF here for a yearly follow up and med refills... he has multiple medical problems as noted below...   ~  July 29, 2010:  Yearly ROV- doing reasonably well w/o new complaints or concerns... she continues to be followed by DrSharma on meds below... BP remains controlled on Procardia & Lasix... Chol has been OK on Prav40 & she will ret for FLP... she knows that she needs to do better re her weight & we reviewed diet + exercise... she saw DrGioffre 11/10 & was given a knee injection, Mobic helps some- refilled...  ~  October 06, 2010:  Add-on appt due to elev BP readings at church exercise program> she brings list w/ recent BP 140's-170/ 90's;  feeling well w/o HA, visual symptoms, CP, palpit, edema, etc;  reminded about diet- low sodium, low cal, etc;  we decided to add LOSARTAN 50mg /d.    Asthma, allergies, Lipids, GI- all stable & doing satis per pt...  ~  August 26, 2011:  29mo ROV & here "for my yearly check-up"> she reports persist cough- irritative, no sputum, no hemoptysis, no CP, no ch in chr DOE etc;  She even had an episode of post tussive syncope (no injury) & we discussed this, plus the need for TUSSIONEX Q12H as needed...  She notes rash on legs> ?etiology & we discussed trial Lidex-E cream & rec Derm eval if not resolved...  She's had the 2012 Flu vaccine, had fasting labs done today, needs refills...     AR> on Xyzal5, Singulair10, Omnaris, & allergy shots from DrSharma; seen 8/12 & his note is reviewed...    Asthma> on Symbicort160, Spiriva, NEBS w/ Albut, & Tussionex; she had an infectious exac 8/12 & saw DrSharma who gave her Depo/ Pred/ ZPak & improved...    HBP> on ProcXL60, Losar50, Lasix40, K20; BP= 136/80 & she denies CP, palpit, dizzy, ch in SOB/DOE, edema...    Chol> on Prav40 + FishOil; FLP looks good on diet + Rx- continue same...    Overweight> wt up 11# to 220#  today; we reviewed diet, exercise, wt reduction strategies...    GI> GERD, Divertics, Polyps, Elev AlkPhos> on Zegerid & followed by DrDBrodie; last colonoscopy was 7/06 & follow up planned 70yrs.    DJD> on Mobic15 as needed; she has been seen by DrGioffre in the past w/ shot in the left knee which helped...  ~  November 15, 2011:  15mo ROV & add-on at her request for a lesion on her back- initially itched, now sl sore, & exam shows combination of SKs, cherry red spots, and a seb cyst> we discuissed Atarax 25mg  prn itching, and Doxycycline 100mg  bid x10d for the cyst - sl red, inflammed, r/o infection;  She is allergic to PCN w/ hives... We will refer to [facility] for poss excision of the cyst...          Problem List:  ALLERGIC RHINITIS (ICD-477.9) - followed by DrSharma on shots weekly... also takes OMNARIS 2sp in each nostril daily,  SINGULAIR 10mg /d,  XYZAL 5mg /d... prev allergy tests + for trees, grass, ragweed, molds, dust, cat>dog, etc...  Hx of SINUSITIS (ICD-473.9) - no recent infections or antibiotics required.  ASTHMA (ICD-493.90) - Former pt of DrSlotnick yrs ago, also eval by DrKozlow & DrSane at [facility]... stable on SYMBICORT 160 2spBid, SPIRIVA daily, & VENTOLIN HFA as  needed (DrSharma prescribes these meds)... she has min cough, small amt clear sputum and no recent wheezing, increased dyspnea, chest pain, etc...  HYPERTENSION (ICD-401.9) - on PROCARDIA XL 60mg /d,  LOSARTAN50, LASIX 40mg /d,  KCl - 2/d... ~  2/12:  add-on for elev BP at church exerc program> we added LOSARTAN 50mg /d. ~  1/13:  BP= 136/80 and she denies HA, fatigue, visual changes, CP, palipit, dizziness, syncope, dyspnea, edema, etc...  VENOUS INSUFFICIENCY (ICD-459.81) - she follows low sodium diet, elevates legs, wears support hose, and takes the Lasix...  HYPERCHOLESTEROLEMIA, BORDERLINE (ICD-272.4) - on PRAVASTATIN 40mg /d + low fat diet... ~  FLP 5/07 showed TChol 190, TG 68, HDL 56, LDL 120 ~  FLP 7/09 showed TChol  202, TG 53, HDL 52, LDL 141... rec> add Prav40. ~  FLP 11/10 on Prav40 showed TChol 171, TG 70, HDL 57, LDL 100 ~  FLP 12/11 on Prav40 showed TChol 150, TG 36, HDL 56, LDL 87 ~  FLP 1/13 on Prav40 showed TChol 162, TG 38, HDL 63, LDL 91  OVERWEIGHT (ICD-278.02) - discussed low carb/ low fat diet and increased exercise program... ~  weight 7/09 is down 5# to 203#... she knows that she needs to do better... ~  weight 11/10 = 212# ~  weight 12/11 = 215# ~  weight 2/12 = 209# ~  Weight 1/13 = 220# & we reviewed low carb, low fat, wt reducing diet!  GERD (ICD-530.81) - on ZEGERID Bid, & off prev reglan rx... EGD 9/02 showed sliding HH, mild gastritis, neg biopsy...  DIVERTICULOSIS OF COLON (ICD-562.10) + COLONIC POLYPS (ICD-211.3) - last colonoscopy 7/06 by DrDBrodie showed extensive divertics, spasm, redund colon, hems, and sm polyp (no path avail)...  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 she never ret for the GGT. ~  labs 11/10 showed AlkPhos= 195 ( 39-117), GGT= 54 (7-51)... continue to follow. ~  labs 12/11 showed AlkPhos= 158 ~  Labs 1/13 showed AlkPhos= 152  DEGENERATIVE JOINT DISEASE (ICD-715.90) ~  11/10: c/o left knee pain & XRay shows joint effusion- trial MOBIC 15mg /d & refer to Ortho> seen by DrGioffre & given knee injection.  DERM >> she has mult seborrheic keratoses, cherry red spots, and a sebaceous cyst on her back==> referred to [facility].  Health Maintenance -  GYN= DrARoberts... BMD at [facility] office 2005 was WNL w/ TScores -0.9 to +1.1.Marland KitchenMarland Kitchen Mammograms neg at [facility] 2/11... s/p hyst, and A/P repair by DrRoberts 3/10... she had PNEUMOVAX in 2006 (age58) & repeated 12/11 (age64).   Past Surgical History  Procedure Date  . Abdominal hysterectomy   . Apogee / perigee repair 10/2008    Dr. Lance Morin    Outpatient Encounter Prescriptions as of 11/15/2011  Medication Sig Dispense Refill  . albuterol (PROVENTIL HFA;VENTOLIN HFA)  108 (90 BASE) MCG/ACT inhaler Inhale 2 puffs into the lungs every 6 (six) hours as needed.        . budesonide-formoterol (SYMBICORT) 160-4.5 MCG/ACT inhaler Inhale 2 puffs into the lungs 2 (two) times daily.        . chlorpheniramine-HYDROcodone (TUSSIONEX PENNKINETIC ER) 10-8 MG/5ML LQCR Take 5 mLs by mouth every 12 (twelve) hours.  120 mL  5  . cholecalciferol (VITAMIN D) 1000 UNITS tablet Take 1,000 Units by mouth 2 (two) times daily.        . ciclesonide (OMNARIS) 50 MCG/ACT nasal spray Place 2 sprays into both nostrils daily.        . fish oil-omega-3  fatty acids 1000 MG capsule Take 1 g by mouth daily.        . fluocinonide-emollient (LIDEX-E) 0.05 % cream Apply topically 2 (two) times daily.  30 g  3  . furosemide (LASIX) 40 MG tablet Take 1 tablet (40 mg total) by mouth daily.  90 tablet  3  . levocetirizine (XYZAL) 5 MG tablet Take 5 mg by mouth every evening.        Marland Kitchen losartan (COZAAR) 50 MG tablet Take 1 tablet (50 mg total) by mouth daily.  90 tablet  3  . meloxicam (MOBIC) 15 MG tablet Take 15 mg by mouth daily.        . montelukast (SINGULAIR) 10 MG tablet Take 10 mg by mouth at bedtime.        . Multiple Vitamins-Iron (MULTIVITAMINS WITH IRON) TABS Take 1 tablet by mouth three times per week       . Multiple Vitamins-Minerals (MACULAR VITAMIN BENEFIT PO) Take 1 capsule by mouth 2 (two) times daily.        Marland Kitchen NIFEdipine (PROCARDIA XL/ADALAT-CC) 60 MG 24 hr tablet Take 1 tablet (60 mg total) by mouth daily.  90 tablet  3  . Omeprazole-Sodium Bicarbonate (ZEGERID) 20-1100 MG CAPS Take 1 capsule by mouth 2 (two) times daily.        . potassium chloride SA (K-DUR,KLOR-CON) 20 MEQ tablet Take 2 tablets (40 mEq total) by mouth daily.  90 tablet  3  . pravastatin (PRAVACHOL) 40 MG tablet Take 1 tablet (40 mg total) by mouth daily.  90 tablet  3  . doxycycline (VIBRAMYCIN) 100 MG capsule Take 1 capsule (100 mg total) by mouth 2 (two) times daily.  20 capsule  0  . hydrOXYzine  (ATARAX/VISTARIL) 25 MG tablet Take 1 tablet (25 mg total) by mouth every 6 (six) hours as needed for itching.  50 tablet  1    Allergies  Allergen Reactions  . Lisinopril-Hydrochlorothiazide   . Meperidine Hcl   . Penicillins     Current Medications, Allergies, Past Medical History, Past Surgical History, Family History, and Social History were reviewed in [employer] record.    Review of Systems         See HPI - all other systems neg except as noted... The patient complains of dyspnea on exertion.  The patient denies anorexia, fever, weight loss, weight gain, vision loss, decreased hearing, hoarseness, chest pain, syncope, peripheral edema, prolonged cough, headaches, hemoptysis, abdominal pain, melena, hematochezia, severe indigestion/heartburn, hematuria, incontinence, muscle weakness, suspicious skin lesions, transient blindness, difficulty walking, depression, unusual weight change, abnormal bleeding, enlarged lymph nodes, and angioedema.   Objective:   Physical Exam     WD, WN, 66 y/o BF in NAD... GENERAL:  Alert & oriented; pleasant & cooperative... HEENT:  [place]/AT, EOM-wnl, PERRLA, EACs-clear, TMs-wnl, NOSE-clear, THROAT-clear & wnl. NECK:  Supple w/ fairROM; no JVD; normal carotid impulses w/o bruits; no thyromegaly or nodules palpated; no lymphadenopathy. CHEST:  Clear to P & A; without wheezes/ rales/ or rhonchi heard... HEART:  Regular Rhythm; without murmurs/ rubs/ or gallops detected... ABDOMEN:  Soft & nontender; normal bowel sounds; no organomegaly or masses palpated.... EXT: without deformities, mild arthritic changes & decr ROM left knee; no varicose veins/ +venous insuffic/ 1+edema. NEURO:  CN's intact; no focal neuro deficits... DERM:  No lesions noted; no rash etc...  RADIOLOGY DATA:  Reviewed in the EPIC EMR & discussed w/ the patient...    >>Last CXR 11/10 showed clear  lungs, sl incr markings, ectasia of Ao, NAD...  LABORATORY DATA:   Reviewed in the EPIC EMR & discussed w/ the patient...    >>Fasting labs done 08/26/11 & reviewed w/ the pt...   Assessment:     Sebaceous cyst>  Rx for poss infection w/ Doxy bid x10d & refer to [facility] for excision...  AR>  On allergy shots & meds from DrSharma; continue same...  Asthma>  On meds as listed & regimen primarily from DrSharma; his notes are reviewed...  HBP>  BP stable on meds; advised low sodium diet, take meds regularly, etc...  Chol> on Prav40 + FishOil; FLP looks good on diet + Rx- continue same...  Overweight> wt up 11# to 220# today; we reviewed diet, exercise, wt reduction strategies...  GI> GERD, Divertics, Polyps, Elev AlkPhos> on Zegerid & followed by DrDBrodie; last colonoscopy was 7/06 & follow up planned 21yrs.  DJD> on Mobic15 as needed; she has been seen by DrGioffre in the past w/ shot in the left knee which helped...     Plan:     Patient's Medications  New Prescriptions   DOXYCYCLINE (VIBRAMYCIN) 100 MG CAPSULE    Take 1 capsule (100 mg total) by mouth 2 (two) times daily.   HYDROXYZINE (ATARAX/VISTARIL) 25 MG TABLET    Take 1 tablet (25 mg total) by mouth every 6 (six) hours as needed for itching.  Previous Medications   ALBUTEROL (PROVENTIL HFA;VENTOLIN HFA) 108 (90 BASE) MCG/ACT INHALER    Inhale 2 puffs into the lungs every 6 (six) hours as needed.     BUDESONIDE-FORMOTEROL (SYMBICORT) 160-4.5 MCG/ACT INHALER    Inhale 2 puffs into the lungs 2 (two) times daily.     CHLORPHENIRAMINE-HYDROCODONE (TUSSIONEX PENNKINETIC ER) 10-8 MG/5ML LQCR    Take 5 mLs by mouth every 12 (twelve) hours.   CHOLECALCIFEROL (VITAMIN D) 1000 UNITS TABLET    Take 1,000 Units by mouth 2 (two) times daily.     CICLESONIDE (OMNARIS) 50 MCG/ACT NASAL SPRAY    Place 2 sprays into both nostrils daily.     FISH OIL-OMEGA-3 FATTY ACIDS 1000 MG CAPSULE    Take 1 g by mouth daily.     FLUOCINONIDE-EMOLLIENT (LIDEX-E) 0.05 % CREAM    Apply topically 2 (two) times daily.   FUROSEMIDE  (LASIX) 40 MG TABLET    Take 1 tablet (40 mg total) by mouth daily.   LEVOCETIRIZINE (XYZAL) 5 MG TABLET    Take 5 mg by mouth every evening.     LOSARTAN (COZAAR) 50 MG TABLET    Take 1 tablet (50 mg total) by mouth daily.   MELOXICAM (MOBIC) 15 MG TABLET    Take 15 mg by mouth daily.     MONTELUKAST (SINGULAIR) 10 MG TABLET    Take 10 mg by mouth at bedtime.     MULTIPLE VITAMINS-IRON (MULTIVITAMINS WITH IRON) TABS    Take 1 tablet by mouth three times per week    MULTIPLE VITAMINS-MINERALS (MACULAR VITAMIN BENEFIT PO)    Take 1 capsule by mouth 2 (two) times daily.     NIFEDIPINE (PROCARDIA XL/ADALAT-CC) 60 MG 24 HR TABLET    Take 1 tablet (60 mg total) by mouth daily.   OMEPRAZOLE-SODIUM BICARBONATE (ZEGERID) 20-1100 MG CAPS    Take 1 capsule by mouth 2 (two) times daily.     POTASSIUM CHLORIDE SA (K-DUR,KLOR-CON) 20 MEQ TABLET    Take 2 tablets (40 mEq total) by mouth daily.   PRAVASTATIN (PRAVACHOL) 40 MG  TABLET    Take 1 tablet (40 mg total) by mouth daily.  Modified Medications   No medications on file  Discontinued Medications   No medications on file

## 2011-11-17 ENCOUNTER — Ambulatory Visit (INDEPENDENT_AMBULATORY_CARE_PROVIDER_SITE_OTHER): Payer: Medicare Other | Admitting: General Surgery

## 2011-11-17 ENCOUNTER — Encounter (INDEPENDENT_AMBULATORY_CARE_PROVIDER_SITE_OTHER): Payer: Self-pay | Admitting: General Surgery

## 2011-11-17 VITALS — BP 144/86 | HR 70 | Temp 97.4°F | Resp 18 | Ht 64.0 in | Wt 216.6 lb

## 2011-11-17 DIAGNOSIS — L089 Local infection of the skin and subcutaneous tissue, unspecified: Secondary | ICD-10-CM

## 2011-11-17 DIAGNOSIS — L723 Sebaceous cyst: Secondary | ICD-10-CM

## 2011-11-17 NOTE — Patient Instructions (Signed)
You have an infected cyst sebaceous cyst of your back. This area should be completely excised and packed open and then it will heal.  You have stated that you do not want to do this today because you have to go to work.  We will arrange an appointment for you to see me in 2 weeks in the office to remove this area. Continue all the antibiotics until they are gone. . The infection will probably settle down. If the infection flares up again please call the office and we will see you sooner.

## 2011-11-17 NOTE — Progress Notes (Signed)
Patient ID: Wendy Figueroa, female   DOB: 08/03/1946, 66 y.o.   MRN: [id number]  Chief Complaint  Patient presents with  . Cyst    Sebaceous - on back    HPI Wendy Figueroa is a 66 y.o. female.  She is referred by Dr. Kriste Figueroa for a sebaceous cyst of the upper back.  Patient states that she has had a lump on her back for many years. Typically itches. Apparently this was thought to be infected and 2 days ago she was placed on doxycycline and the patient says it feels better. Has not drained anything. It is less tender.  Should she states that she does not want to have any surgery today because she needs to go to work. HPI  Past Medical History  Diagnosis Date  . Allergic rhinitis   . Sinusitis   . Asthma   . Hypertension   . Venous insufficiency   . Hypercholesterolemia   . Overweight   . GERD (gastroesophageal reflux disease)   . Diverticulosis of colon   . Hx of colonic polyps   . Elevated alkaline phosphatase level   . DJD (degenerative joint disease)   . Sebaceous cyst     on back  . Wheezing     occasional  . Cough     occasional  . Leg swelling     occasional    Past Surgical History  Procedure Date  . Apogee / perigee repair 10/2008    Dr. Lance Morin  . Breast surgery 1978    cyst removed from both removed  . Abdominal hysterectomy 1988  . Bladder tack 2011    Family History  Problem Relation Age of Onset  . Aneurysm Mother   . Cancer Father     lung    Social History History  Substance Use Topics  . Smoking status: Never Smoker   . Smokeless tobacco: Never Used  . Alcohol Use: Yes     social use    Allergies  Allergen Reactions  . Demerol Hives    All over the body   . Lisinopril-Hydrochlorothiazide Hives    All over the body  . Penicillins Hives    All over the body    Current Outpatient Prescriptions  Medication Sig Dispense Refill  . albuterol (PROVENTIL HFA;VENTOLIN HFA) 108 (90 BASE) MCG/ACT inhaler Inhale 2 puffs into the lungs  every 6 (six) hours as needed.        . budesonide-formoterol (SYMBICORT) 160-4.5 MCG/ACT inhaler Inhale 2 puffs into the lungs 2 (two) times daily.        . chlorpheniramine-HYDROcodone (TUSSIONEX PENNKINETIC ER) 10-8 MG/5ML LQCR Take 5 mLs by mouth every 12 (twelve) hours.  120 mL  5  . cholecalciferol (VITAMIN D) 1000 UNITS tablet Take 1,000 Units by mouth 2 (two) times daily.        . ciclesonide (OMNARIS) 50 MCG/ACT nasal spray Place 2 sprays into both nostrils daily.        Marland Kitchen doxycycline (VIBRAMYCIN) 100 MG capsule Take 1 capsule (100 mg total) by mouth 2 (two) times daily.  20 capsule  0  . fish oil-omega-3 fatty acids 1000 MG capsule Take 1 g by mouth daily.        . fluocinonide-emollient (LIDEX-E) 0.05 % cream Apply topically 2 (two) times daily.  30 g  3  . furosemide (LASIX) 40 MG tablet Take 1 tablet (40 mg total) by mouth daily.  90 tablet  3  . hydrOXYzine (ATARAX/VISTARIL)  25 MG tablet Take 1 tablet (25 mg total) by mouth every 6 (six) hours as needed for itching.  50 tablet  1  . levocetirizine (XYZAL) 5 MG tablet Take 5 mg by mouth every evening.        Marland Kitchen losartan (COZAAR) 50 MG tablet Take 1 tablet (50 mg total) by mouth daily.  90 tablet  3  . montelukast (SINGULAIR) 10 MG tablet Take 10 mg by mouth at bedtime.        . Multiple Vitamins-Iron (MULTIVITAMINS WITH IRON) TABS Take 1 tablet by mouth three times per week       . Multiple Vitamins-Minerals (MACULAR VITAMIN BENEFIT PO) Take 1 capsule by mouth 2 (two) times daily.        Marland Kitchen NIFEdipine (PROCARDIA XL/ADALAT-CC) 60 MG 24 hr tablet Take 1 tablet (60 mg total) by mouth daily.  90 tablet  3  . Omeprazole-Sodium Bicarbonate (ZEGERID) 20-1100 MG CAPS Take 1 capsule by mouth 2 (two) times daily.        . potassium chloride SA (K-DUR,KLOR-CON) 20 MEQ tablet Take 2 tablets (40 mEq total) by mouth daily.  90 tablet  3  . pravastatin (PRAVACHOL) 40 MG tablet Take 1 tablet (40 mg total) by mouth daily.  90 tablet  3    Review of  Systems Review of Systems  Constitutional: Negative.   HENT: Negative.   Eyes: Negative.   Respiratory: Negative.   Cardiovascular: Negative.   Gastrointestinal: Negative.   Genitourinary: Negative.   Neurological: Negative.   Hematological: Negative.   Psychiatric/Behavioral: Negative.     Blood pressure 144/86, pulse 70, temperature 97.4 F (36.3 C), temperature source Temporal, resp. rate 18, height 5\' 4"  (1.626 m), weight 216 lb 9.6 oz (98.249 kg).  Physical Exam Physical Exam  Constitutional: She appears well-developed and well-nourished. No distress.  HENT:  Head: Atraumatic.  Eyes: Conjunctivae are normal. Pupils are equal, round, and reactive to light.  Skin: Skin is warm and dry. No rash noted. She is not diaphoretic. No erythema. No pallor.       2.5 cm x 1.5 cm sebaceous cyst of upper back. Chronic skin discoloration. Is minimally tender. There is no fluctuance. There is no drainage. Chronic infection is suggested.  Psychiatric: She has a normal mood and affect. Her behavior is normal. Judgment and thought content normal.    Data Reviewed I reviewed Dr.Nadels office notes.  Assessment    2.5 cm sebaceous cyst of upper back. Possible chronic infection with recent mild acute exacerbation.  Asthma  Hypertension  Obesity  Hyperlipidemia  GERD.    Plan    I offered to excise this area today and pack it open. She declined, stating it was important for her to go to work. Considering her minimal symptoms and only subtle physical findings then we might be able to avoid opening this up today.  Our plan will be for her to continue the doxycycline and warm soaks.  She will return to see me in 2 weeks for elective excision in the office.  She is instructed carefully to call and return to the office if this area becomes more swollen and more painful, and she is instructed that this will need to be done sooner if her symptoms progress. If symptoms subside then we can  hold off for a couple of weeks. She is very much in favor of this plan due to her work schedule.       Angelia Mould. Derrell Lolling, M.D., FACS  [facility] General and Minimally invasive Surgery Breast and Colorectal Surgery Office:   [phone] Pager:   [phone]  11/17/2011, 10:15 AM

## 2011-11-28 ENCOUNTER — Encounter (INDEPENDENT_AMBULATORY_CARE_PROVIDER_SITE_OTHER): Payer: Self-pay | Admitting: Surgery

## 2011-11-28 ENCOUNTER — Ambulatory Visit (INDEPENDENT_AMBULATORY_CARE_PROVIDER_SITE_OTHER): Payer: Medicare Other | Admitting: Surgery

## 2011-11-28 VITALS — BP 138/75 | HR 84 | Temp 97.8°F | Ht 64.0 in | Wt 218.8 lb

## 2011-11-28 DIAGNOSIS — L089 Local infection of the skin and subcutaneous tissue, unspecified: Secondary | ICD-10-CM

## 2011-11-28 DIAGNOSIS — L723 Sebaceous cyst: Secondary | ICD-10-CM

## 2011-11-28 NOTE — Progress Notes (Signed)
Subjective:     Patient ID: Wendy Figueroa, female   DOB: 1946/01/30, 66 y.o.   MRN: [id number]  HPI She is here for a followup visit of her infected sebaceous cyst. She was going to have this formally excised but occurring tomorrow. She had finished her antibiotics but it started draining so she came today to the urgent office.  Review of Systems     Objective:   Physical Exam On exam, the skin is totally broken down. There was one skin bridge type cut with a scalpel. I removed all sebaceous debris. There is no purulence and no signs of infection. Her to large amount of granulation tissue which I treated with sore nitrate.    Assessment:     Infected sebaceous cyst    Plan:     At this point, there is nothing further to do other than dressing changes. I will Have Her place Neosporin on this daily to twice a day and washes with water. I will see her back next week. Currently there is no need for antibiotics

## 2011-11-29 ENCOUNTER — Encounter (INDEPENDENT_AMBULATORY_CARE_PROVIDER_SITE_OTHER): Payer: Medicare Other | Admitting: General Surgery

## 2011-12-06 ENCOUNTER — Encounter (INDEPENDENT_AMBULATORY_CARE_PROVIDER_SITE_OTHER): Payer: Self-pay | Admitting: Surgery

## 2011-12-06 ENCOUNTER — Ambulatory Visit (INDEPENDENT_AMBULATORY_CARE_PROVIDER_SITE_OTHER): Payer: Medicare Other | Admitting: Surgery

## 2011-12-06 VITALS — BP 132/80 | HR 72 | Resp 18 | Wt 214.0 lb

## 2011-12-06 DIAGNOSIS — L089 Local infection of the skin and subcutaneous tissue, unspecified: Secondary | ICD-10-CM

## 2011-12-06 DIAGNOSIS — L723 Sebaceous cyst: Secondary | ICD-10-CM

## 2011-12-06 NOTE — Progress Notes (Signed)
Subjective:     Patient ID: Wendy Figueroa, female   DOB: [birth year], 66 y.o.   MRN: [id number]  HPI  She is here for followup of an infected sebaceous cyst that spontaneously ruptured. She is using Neosporin daily. She has no complaints Review of Systems     Objective:   Physical Exam On exam, the wound is nice and clean and contracting well. I was able to easily remove the rest of the capsule bluntly. I then treated with silver nitrate    Assessment:     Infected sebaceous cyst on the back    Plan:     The wound is healing well. She will continue her current wound care. I will see her back in approximately 3 weeks

## 2011-12-29 ENCOUNTER — Ambulatory Visit (INDEPENDENT_AMBULATORY_CARE_PROVIDER_SITE_OTHER): Payer: Medicare Other | Admitting: Surgery

## 2011-12-29 ENCOUNTER — Encounter (INDEPENDENT_AMBULATORY_CARE_PROVIDER_SITE_OTHER): Payer: Self-pay | Admitting: Surgery

## 2011-12-29 VITALS — BP 130/78 | HR 82 | Resp 18 | Ht 64.0 in | Wt 212.4 lb

## 2011-12-29 DIAGNOSIS — L723 Sebaceous cyst: Secondary | ICD-10-CM

## 2011-12-29 NOTE — Progress Notes (Signed)
Subjective:     Patient ID: Wendy Figueroa, female   DOB: 1945/10/15, 66 y.o.   MRN: [id number]  HPI She is here for another followup visit. She reports the wound is now completely closed  Review of Systems     Objective:   Physical Exam Indeed, the wound is completely healed on her back    Assessment:     Patient with heel wound status post rupture of infected sebaceous cyst    Plan:     I believe the cord and capsule were totally removed. Hopefully this will not recur. If it does she will come back and see [place]

## 2012-02-07 ENCOUNTER — Encounter: Payer: Self-pay | Admitting: Pulmonary Disease

## 2012-02-20 ENCOUNTER — Ambulatory Visit (INDEPENDENT_AMBULATORY_CARE_PROVIDER_SITE_OTHER): Payer: Medicare Other | Admitting: Obstetrics and Gynecology

## 2012-02-20 ENCOUNTER — Encounter: Payer: Self-pay | Admitting: Obstetrics and Gynecology

## 2012-02-20 ENCOUNTER — Ambulatory Visit (INDEPENDENT_AMBULATORY_CARE_PROVIDER_SITE_OTHER): Payer: Medicare Other

## 2012-02-20 VITALS — BP 122/70 | Resp 16 | Ht 64.0 in | Wt 213.0 lb

## 2012-02-20 DIAGNOSIS — M899 Disorder of bone, unspecified: Secondary | ICD-10-CM

## 2012-02-20 DIAGNOSIS — Z13820 Encounter for screening for osteoporosis: Secondary | ICD-10-CM

## 2012-02-20 NOTE — Progress Notes (Deleted)
Contraception hyst Last pap 2010 wnl Last Mammo last month follow up Last Colonoscopy 2002 approx Last Dexa Scan never Primary MD Dr. Alroy Dust Abuse at Home none

## 2012-02-20 NOTE — Progress Notes (Signed)
Contraception hyst Last pap 2010 wnl Last Mammo last month follow up Last Colonoscopy 2002 approx Last Dexa Scan never Primary MD Dr. Alroy Dust [facility] at Home none  No complaints  Filed Vitals:   02/20/12 1013  BP: 122/70  Resp: 16   ROS: noncontributory  Physical Examination: General appearance - alert, well appearing, and in no distress Neck - supple, no significant adenopathy Chest - clear to auscultation, no wheezes, rales or rhonchi, symmetric air entry Heart - normal rate and regular rhythm Abdomen - soft, nontender, nondistended, no masses or organomegaly Breasts - breasts appear normal, no suspicious masses, no skin or nipple changes or axillary nodes Pelvic - normal external genitalia, vulva, vagina with about 5mm area at posterior introitus where mesh has eroded, no palpable adnexa Back exam - no CVAT Extremities - no edema, redness or tenderness in the calves or thighs Rectal - no masses  A/P BDS today - wnl Paps no longer required Pt had hyst for benign reasons many yrs ago and neg pap since then Pt instructed to contact her GI doctor for f/u colonoscopy Precautions re mesh discussed - Pt is currently asymptomatic and would like to observe

## 2012-02-22 ENCOUNTER — Encounter: Payer: Self-pay | Admitting: *Deleted

## 2012-03-02 ENCOUNTER — Encounter: Payer: Self-pay | Admitting: Internal Medicine

## 2012-04-16 ENCOUNTER — Telehealth: Payer: Self-pay | Admitting: Pulmonary Disease

## 2012-04-16 MED ORDER — POTASSIUM CHLORIDE CRYS ER 20 MEQ PO TBCR: EXTENDED_RELEASE_TABLET | ORAL | Status: DC

## 2012-04-16 NOTE — Telephone Encounter (Signed)
rx for the potassium has been sent to the mail order pharmacy for the pt.  Called and spoke with pt and she is aware.  Nothing further is needed.

## 2012-06-04 ENCOUNTER — Other Ambulatory Visit: Payer: Self-pay | Admitting: Pulmonary Disease

## 2012-06-07 ENCOUNTER — Telehealth: Payer: Self-pay | Admitting: Pulmonary Disease

## 2012-06-07 NOTE — Telephone Encounter (Signed)
i have called this rx in to the pharmacy for the pt.  thanks

## 2012-06-07 NOTE — Telephone Encounter (Signed)
Spoke with pt. She is req refill on her hydrocodone cough syrup. She states " my cough never stopped"- she states cough comes and goes and bothers her when she gets too hot, too active, and too stressed. She states cough is dry in nature. Would like refill today. Please advise, thanks! Last ov 11/15/11 No pending appts  Allergies  Allergen Reactions  . Zestoretic (Lisinopril-Hydrochlorothiazide)   . Demerol Hives    All over the body   . Lisinopril-Hydrochlorothiazide Hives    All over the body  . Penicillins Hives    All over the body

## 2012-06-07 NOTE — Telephone Encounter (Signed)
Pt aware and nothing further needed 

## 2012-08-28 ENCOUNTER — Encounter: Payer: Self-pay | Admitting: Pulmonary Disease

## 2012-08-28 ENCOUNTER — Other Ambulatory Visit (INDEPENDENT_AMBULATORY_CARE_PROVIDER_SITE_OTHER): Payer: Medicare Other

## 2012-08-28 ENCOUNTER — Ambulatory Visit (INDEPENDENT_AMBULATORY_CARE_PROVIDER_SITE_OTHER): Payer: Medicare Other | Admitting: Pulmonary Disease

## 2012-08-28 VITALS — BP 142/70 | HR 71 | Temp 97.7°F | Ht 64.0 in | Wt 215.2 lb

## 2012-08-28 DIAGNOSIS — E785 Hyperlipidemia, unspecified: Secondary | ICD-10-CM

## 2012-08-28 DIAGNOSIS — F419 Anxiety disorder, unspecified: Secondary | ICD-10-CM

## 2012-08-28 DIAGNOSIS — M1990 Unspecified osteoarthritis, unspecified site: Secondary | ICD-10-CM

## 2012-08-28 DIAGNOSIS — K219 Gastro-esophageal reflux disease without esophagitis: Secondary | ICD-10-CM

## 2012-08-28 DIAGNOSIS — J309 Allergic rhinitis, unspecified: Secondary | ICD-10-CM

## 2012-08-28 DIAGNOSIS — I10 Essential (primary) hypertension: Secondary | ICD-10-CM

## 2012-08-28 DIAGNOSIS — K5730 Diverticulosis of large intestine without perforation or abscess without bleeding: Secondary | ICD-10-CM

## 2012-08-28 DIAGNOSIS — D126 Benign neoplasm of colon, unspecified: Secondary | ICD-10-CM

## 2012-08-28 DIAGNOSIS — F411 Generalized anxiety disorder: Secondary | ICD-10-CM

## 2012-08-28 DIAGNOSIS — I872 Venous insufficiency (chronic) (peripheral): Secondary | ICD-10-CM

## 2012-08-28 DIAGNOSIS — E663 Overweight: Secondary | ICD-10-CM

## 2012-08-28 DIAGNOSIS — J329 Chronic sinusitis, unspecified: Secondary | ICD-10-CM

## 2012-08-28 DIAGNOSIS — J45909 Unspecified asthma, uncomplicated: Secondary | ICD-10-CM

## 2012-08-28 LAB — HEPATIC FUNCTION PANEL
ALT: 22 U/L (ref 0–35)
AST: 30 U/L (ref 0–37)
Albumin: 4 g/dL (ref 3.5–5.2)
Total Bilirubin: 1 mg/dL (ref 0.3–1.2)

## 2012-08-28 LAB — CBC WITH DIFFERENTIAL/PLATELET
Basophils Relative: 0.7 % (ref 0.0–3.0)
Eosinophils Relative: 4.7 % (ref 0.0–5.0)
Monocytes Relative: 9.6 % (ref 3.0–12.0)
Neutrophils Relative %: 48.9 % (ref 43.0–77.0)
Platelets: 273 10*3/uL (ref 150.0–400.0)
RBC: 4.7 Mil/uL (ref 3.87–5.11)
WBC: 6 10*3/uL (ref 4.5–10.5)

## 2012-08-28 LAB — BASIC METABOLIC PANEL
BUN: 13 mg/dL (ref 6–23)
Chloride: 106 mEq/L (ref 96–112)
Creatinine, Ser: 0.7 mg/dL (ref 0.4–1.2)
GFR: 106.75 mL/min (ref >60.00)
Glucose, Bld: 105 mg/dL — ABNORMAL HIGH (ref 70–99)
Potassium: 4.2 mEq/L (ref 3.5–5.1)

## 2012-08-28 LAB — LIPID PANEL
Cholesterol: 162 mg/dL (ref 0–200)
LDL Cholesterol: 96 mg/dL (ref 0–99)
Triglycerides: 35 mg/dL (ref 0.0–149.0)
VLDL: 7 mg/dL (ref 0.0–40.0)

## 2012-08-28 LAB — TSH: TSH: 0.88 u[IU]/mL (ref 0.35–5.50)

## 2012-08-28 MED ORDER — LOSARTAN POTASSIUM 50 MG PO TABS: 50.0000 mg | ORAL_TABLET | Freq: Every day | ORAL | Status: DC

## 2012-08-28 MED ORDER — FUROSEMIDE 40 MG PO TABS: 40.0000 mg | ORAL_TABLET | Freq: Every day | ORAL | Status: DC

## 2012-08-28 MED ORDER — POTASSIUM CHLORIDE CRYS ER 20 MEQ PO TBCR: EXTENDED_RELEASE_TABLET | ORAL | Status: DC

## 2012-08-28 MED ORDER — PRAVASTATIN SODIUM 40 MG PO TABS: 40.0000 mg | ORAL_TABLET | Freq: Every day | ORAL | Status: DC

## 2012-08-28 MED ORDER — HYDROCOD POLST-CHLORPHEN POLST 10-8 MG/5ML PO LQCR: 5.0000 mL | Freq: Two times a day (BID) | ORAL | Status: DC

## 2012-08-28 MED ORDER — NIFEDIPINE ER OSMOTIC RELEASE 60 MG PO TB24: 60.0000 mg | ORAL_TABLET | Freq: Every day | ORAL | Status: DC

## 2012-08-28 NOTE — Progress Notes (Signed)
Subjective:     Patient ID: Wendy Figueroa, female   DOB: 1945/10/30, 67 y.o.   MRN: [id number]  HPI 67 y/o BF here for a yearly follow up and med refills... he has multiple medical problems as noted below...   ~  August 26, 2011:  67mo ROV & here "for my yearly check-up"> she reports persist cough- irritative, no sputum, no hemoptysis, no CP, no ch in chr DOE etc;  She even had an episode of post tussive syncope (no injury) & we discussed this, plus the need for TUSSIONEX Q12H as needed...  She notes rash on legs> ?etiology & we discussed trial Lidex-E cream & rec Derm eval if not resolved...  She's had the 2012 Flu vaccine, had fasting labs done today, needs refills...     AR> on Xyzal5, Singulair10, Omnaris, & allergy shots from DrSharma; seen 8/12 & his note is reviewed...    Asthma> on Symbicort160, Spiriva, NEBS w/ Albut, & Tussionex; she had an infectious exac 8/12 & saw DrSharma who gave her Depo/ Pred/ ZPak & improved...    HBP> on ProcXL60, Losar50, Lasix40, K20; BP= 136/80 & she denies CP, palpit, dizzy, ch in SOB/DOE, edema...    Chol> on Prav40 + FishOil; FLP looks good on diet + Rx- continue same...    Overweight> wt up 11# to 220# today; we reviewed diet, exercise, wt reduction strategies...    GI> GERD, Divertics, Polyps, Elev AlkPhos> on Zegerid & followed by DrDBrodie; last colonoscopy was 7/06 & follow up planned 56yrs.    DJD> on Mobic15 as needed; she has been seen by DrGioffre in the past w/ shot in the left knee which helped...  ~  November 15, 2011:  8mo ROV & add-on at her request for a lesion on her back- initially itched, now sl sore, & exam shows combination of SKs, cherry red spots, and a seb cyst> we discussed Atarax 25mg  prn itching, and Doxycycline 100mg  bid x10d for the cyst - sl red, inflammed, r/o infection;  She is allergic to PCN w/ hives... We will refer to [facility] for poss excision of the cyst...  ~  August 28, 2012:  80mo ROV & Wendy Figueroa reports a good interval w/o new  complaints or concerns...     AR> on Xyzal5, Singulair10, Omnaris, & allergy shots from DrSharma; seen 8/12 & his note is reviewed...    Asthma> on Symbicort160, NEBS w/ Albut, & Tussionex; she denies recent asthma exac, breathing well w/o wheezing, cough, SOB, etc...    HBP> on ProcXL60, Losar50, Lasix40, K20; BP= 140/70 & she denies CP, palpit, dizzy, ch in SOB/DOE, edema...    Chol> on Prav40 + FishOil; FLP shows TChol 162, TG 35, HDL 59, LDL 96    Overweight> wt down 5# to 215# today; we reviewed diet, exercise, wt reduction strategies...    GI> GERD, Divertics, Polyps, Elev AlkPhos> on Zegerid & followed by DrDBrodie; last colonoscopy was 7/06 & follow up planned 28yrs.    DJD> on Mobic15 as needed; she has been seen by DrGioffre in the past w/ shot in the left knee which helped... We reviewed prob list, meds, xrays and labs> see below for updates >>  LABS 1/14:  FLP- at goals on Prav40;  Chems- wnl;  CBC- wnl w/ Hg=12.6 & MCV=81;  TSH=0.88           Problem List:  ALLERGIC RHINITIS (ICD-477.9) - followed by DrSharma on shots weekly... also takes OMNARIS 2sp in each nostril daily,  SINGULAIR 10mg /d,  XYZAL 5mg /d... prev allergy tests + for trees, grass, ragweed, molds, dust, cat>dog, etc...  Hx of SINUSITIS (ICD-473.9) - no recent infections or antibiotics required.  ASTHMA (ICD-493.90) - Former pt of DrSlotnick yrs ago, also eval by DrKozlow & DrSane at [facility]... stable on SYMBICORT 160 2spBid, SPIRIVA daily, & VENTOLIN HFA as needed (DrSharma prescribes these meds)... she has min cough, small amt clear sputum and no recent wheezing, increased dyspnea, chest pain, etc...  HYPERTENSION (ICD-401.9) - on PROCARDIA XL 60mg /d,  LOSARTAN50, LASIX 40mg /d,  KCl - 2/d... ~  2/12:  add-on for elev BP at [facility] exerc program> we added LOSARTAN 50mg /d. ~  1/13:  BP= 136/80 and she denies HA, fatigue, visual changes, CP, palipit, dizziness, syncope, dyspnea, edema, etc... ~  1/14:  BP= 142/70 & she  remains essentially asymptomatic...   VENOUS INSUFFICIENCY (ICD-459.81) - she follows low sodium diet, elevates legs, wears support hose, and takes the Lasix...  HYPERCHOLESTEROLEMIA, BORDERLINE (ICD-272.4) - on PRAVASTATIN 40mg /d + low fat diet... ~  FLP 5/07 showed TChol 190, TG 68, HDL 56, LDL 120 ~  FLP 7/09 showed TChol 202, TG 53, HDL 52, LDL 141... rec> add Prav40. ~  FLP 11/10 on Prav40 showed TChol 171, TG 70, HDL 57, LDL 100 ~  FLP 12/11 on Prav40 showed TChol 150, TG 36, HDL 56, LDL 87 ~  FLP 1/13 on Prav40 showed TChol 162, TG 38, HDL 63, LDL 91 ~  FLP 1/14 on Prav40 showed TChol 162, TG 35, HDL 59, LDL 96  OVERWEIGHT (ICD-278.02) - discussed low carb/ low fat diet and increased exercise program... ~  weight 7/09 is down 5# to 203#... she knows that she needs to do better... ~  weight 11/10 = 212# ~  weight 12/11 = 215# ~  weight 2/12 = 209# ~  Weight 1/13 = 220# & we reviewed low carb, low fat, wt reducing diet! ~  Weight 1/14 = 215#   GERD (ICD-530.81) - on ZEGERID prn, & off prev reglan rx... EGD 9/02 showed sliding HH, mild gastritis, neg biopsy...  DIVERTICULOSIS OF COLON (ICD-562.10) + COLONIC POLYPS (ICD-211.3) - last colonoscopy 7/06 by DrDBrodie showed extensive divertics, spasm, redund colon, hems, and sm polyp (no path avail)...  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 she never ret for the GGT. ~  labs 11/10 showed AlkPhos= 195 ( 39-117), GGT= 54 (7-51)... continue to follow. ~  labs 12/11 showed AlkPhos= 158 ~  Labs 1/13 showed AlkPhos= 152 ~  Labs 1/14 showed AlkPhos= 146  DEGENERATIVE JOINT DISEASE (ICD-715.90) ~  11/10: c/o left knee pain & XRay shows joint effusion- trial MOBIC 15mg /d & refer to Ortho> seen by DrGioffre & given knee injection.  DERM >> she has mult seborrheic keratoses, cherry red spots, and a sebaceous cyst on her back==> referred to [facility].  Health Maintenance -  GYN= DrARoberts... BMD at  [facility] office 2005 was WNL w/ TScores -0.9 to +1.1.Marland KitchenMarland Kitchen Mammograms neg at [facility] 2/11... s/p hyst, and A/P repair by DrRoberts 3/10... she had PNEUMOVAX in 2006 (age58) & repeated 12/11 (age64).   Past Surgical History  Procedure Date  . Apogee / perigee repair 10/2008    Dr. Lance Morin  . Breast surgery 1978    cyst removed from both removed  . Abdominal hysterectomy 1988  . Bladder tack 2011  . Anterior and posterior repair 11/05/08    Outpatient Encounter Prescriptions as of 08/28/2012  Medication Sig Dispense  Refill  . albuterol (PROVENTIL HFA;VENTOLIN HFA) 108 (90 BASE) MCG/ACT inhaler Inhale 2 puffs into the lungs every 6 (six) hours as needed.        . budesonide-formoterol (SYMBICORT) 160-4.5 MCG/ACT inhaler Inhale 2 puffs into the lungs 2 (two) times daily.        . chlorpheniramine-HYDROcodone (TUSSIONEX) 10-8 MG/5ML LQCR TAKE 1 TEASPOONFUL BY MOUTH EVERY 12 HOURS  120 mL  1  . cholecalciferol (VITAMIN D) 1000 UNITS tablet Take 1,000 Units by mouth 2 (two) times daily.        . ciclesonide (OMNARIS) 50 MCG/ACT nasal spray Place 2 sprays into both nostrils daily.        . furosemide (LASIX) 40 MG tablet Take 1 tablet (40 mg total) by mouth daily.  90 tablet  3  . levocetirizine (XYZAL) 5 MG tablet Take 5 mg by mouth every evening.        Marland Kitchen losartan (COZAAR) 50 MG tablet Take 1 tablet (50 mg total) by mouth daily.  90 tablet  3  . montelukast (SINGULAIR) 10 MG tablet Take 10 mg by mouth at bedtime.        . Multiple Vitamins-Iron (MULTIVITAMINS WITH IRON) TABS Take 1 tablet by mouth three times per week       . Multiple Vitamins-Minerals (MACULAR VITAMIN BENEFIT PO) Take 1 capsule by mouth 2 (two) times daily.        Marland Kitchen NIFEdipine (PROCARDIA XL/ADALAT-CC) 60 MG 24 hr tablet Take 1 tablet (60 mg total) by mouth daily.  90 tablet  3  . Omeprazole-Sodium Bicarbonate (ZEGERID) 20-1100 MG CAPS Take 1 capsule by mouth 2 (two) times daily.        . potassium chloride SA (K-DUR,KLOR-CON) 20  MEQ tablet Take 2 tablets by mouth daily  180 tablet  1  . pravastatin (PRAVACHOL) 40 MG tablet Take 1 tablet (40 mg total) by mouth daily.  90 tablet  3    Allergies  Allergen Reactions  . Zestoretic (Lisinopril-Hydrochlorothiazide)   . Demerol Hives    All over the body   . Lisinopril-Hydrochlorothiazide Hives    All over the body  . Penicillins Hives    All over the body    Current Medications, Allergies, Past Medical History, Past Surgical History, Family History, and Social History were reviewed in [employer] record.    Review of Systems         See HPI - all other systems neg except as noted... The patient complains of dyspnea on exertion.  The patient denies anorexia, fever, weight loss, weight gain, vision loss, decreased hearing, hoarseness, chest pain, syncope, peripheral edema, prolonged cough, headaches, hemoptysis, abdominal pain, melena, hematochezia, severe indigestion/heartburn, hematuria, incontinence, muscle weakness, suspicious skin lesions, transient blindness, difficulty walking, depression, unusual weight change, abnormal bleeding, enlarged lymph nodes, and angioedema.   Objective:   Physical Exam     WD, WN, 66 y/o BF in NAD... GENERAL:  Alert & oriented; pleasant & cooperative... HEENT:  [place]/AT, EOM-wnl, PERRLA, EACs-clear, TMs-wnl, NOSE-clear, THROAT-clear & wnl. NECK:  Supple w/ fairROM; no JVD; normal carotid impulses w/o bruits; no thyromegaly or nodules palpated; no lymphadenopathy. CHEST:  Clear to P & A; without wheezes/ rales/ or rhonchi heard... HEART:  Regular Rhythm; without murmurs/ rubs/ or gallops detected... ABDOMEN:  Soft & nontender; normal bowel sounds; no organomegaly or masses palpated.... EXT: without deformities, mild arthritic changes & decr ROM left knee; no varicose veins/ +venous insuffic/ 1+edema. NEURO:  CN's intact;  no focal neuro deficits... DERM:  No lesions noted; no rash etc...  RADIOLOGY DATA:   Reviewed in the EPIC EMR & discussed w/ the patient...    >>Last CXR 11/10 showed clear lungs, sl incr markings, ectasia of Ao, NAD...  LABORATORY DATA:  Reviewed in the EPIC EMR & discussed w/ the patient...   Assessment:      AR>  On allergy shots & meds from DrSharma; continue same...  Asthma>  On meds as listed & regimen primarily from DrSharma; his notes are reviewed...  HBP>  BP stable on meds; advised low sodium diet, take meds regularly, etc...  Chol> on Prav40 + FishOil; FLP looks good on diet + Rx- continue same...  Overweight> wt up 11# to 220# today; we reviewed diet, exercise, wt reduction strategies...  GI> GERD, Divertics, Polyps, Elev AlkPhos> on Zegerid & followed by DrDBrodie; last colonoscopy was 7/06 & follow up planned 7yrs.  DJD> on Mobic15 as needed; she has been seen by DrGioffre in the past w/ shot in the left knee which helped...     Plan:     Patient's Medications  New Prescriptions   No medications on file  Previous Medications   ALBUTEROL (PROVENTIL HFA;VENTOLIN HFA) 108 (90 BASE) MCG/ACT INHALER    Inhale 2 puffs into the lungs every 6 (six) hours as needed.     BUDESONIDE-FORMOTEROL (SYMBICORT) 160-4.5 MCG/ACT INHALER    Inhale 2 puffs into the lungs 2 (two) times daily.     CHOLECALCIFEROL (VITAMIN D) 1000 UNITS TABLET    Take 1,000 Units by mouth 2 (two) times daily.     CICLESONIDE (OMNARIS) 50 MCG/ACT NASAL SPRAY    Place 2 sprays into both nostrils daily.     LEVOCETIRIZINE (XYZAL) 5 MG TABLET    Take 5 mg by mouth every evening.     MONTELUKAST (SINGULAIR) 10 MG TABLET    Take 10 mg by mouth at bedtime.     MULTIPLE VITAMINS-IRON (MULTIVITAMINS WITH IRON) TABS    Take 1 tablet by mouth three times per week    MULTIPLE VITAMINS-MINERALS (MACULAR VITAMIN BENEFIT PO)    Take 1 capsule by mouth 2 (two) times daily.     OMEPRAZOLE-SODIUM BICARBONATE (ZEGERID) 20-1100 MG CAPS    Take 1 capsule by mouth 2 (two) times daily.    Modified Medications    Modified Medication Previous Medication   CHLORPHENIRAMINE-HYDROCODONE (TUSSIONEX) 10-8 MG/5ML LQCR chlorpheniramine-HYDROcodone (TUSSIONEX) 10-8 MG/5ML LQCR      Take 5 mLs by mouth every 12 (twelve) hours.    TAKE 1 TEASPOONFUL BY MOUTH EVERY 12 HOURS   FUROSEMIDE (LASIX) 40 MG TABLET furosemide (LASIX) 40 MG tablet      Take 1 tablet (40 mg total) by mouth daily.    Take 1 tablet (40 mg total) by mouth daily.   LOSARTAN (COZAAR) 50 MG TABLET losartan (COZAAR) 50 MG tablet      Take 1 tablet (50 mg total) by mouth daily.    Take 1 tablet (50 mg total) by mouth daily.   NIFEDIPINE (PROCARDIA XL/ADALAT-CC) 60 MG 24 HR TABLET NIFEdipine (PROCARDIA XL/ADALAT-CC) 60 MG 24 hr tablet      Take 1 tablet (60 mg total) by mouth daily.    Take 1 tablet (60 mg total) by mouth daily.   POTASSIUM CHLORIDE SA (K-DUR,KLOR-CON) 20 MEQ TABLET potassium chloride SA (K-DUR,KLOR-CON) 20 MEQ tablet      Take 2 tablets by mouth daily    Take 2 tablets  by mouth daily   PRAVASTATIN (PRAVACHOL) 40 MG TABLET pravastatin (PRAVACHOL) 40 MG tablet      Take 1 tablet (40 mg total) by mouth daily.    Take 1 tablet (40 mg total) by mouth daily.  Discontinued Medications   No medications on file

## 2012-08-28 NOTE — Patient Instructions (Addendum)
Today we updated your med list in our EPIC system...    Continue your current medications the same...    We refilled your meds per request...  Today we did your follow up FASTING blood work...    We will contact you w/ the results when avail...  Call for any questions...  Let's continue our yearly check ups, but call anytime if needed for problems.Marland KitchenMarland Kitchen

## 2012-12-06 ENCOUNTER — Encounter: Payer: Self-pay | Admitting: Internal Medicine

## 2013-03-14 ENCOUNTER — Other Ambulatory Visit: Payer: Self-pay | Admitting: Pulmonary Disease

## 2013-03-18 ENCOUNTER — Telehealth: Payer: Self-pay | Admitting: Pulmonary Disease

## 2013-03-18 MED ORDER — HYDROCOD POLST-CHLORPHEN POLST 10-8 MG/5ML PO LQCR: 5.0000 mL | Freq: Two times a day (BID) | ORAL | Status: DC | PRN

## 2013-03-18 NOTE — Telephone Encounter (Signed)
Ok to send in refills for the tussionex.  Can give 5 refills. thanks

## 2013-03-18 NOTE — Telephone Encounter (Signed)
ATC pt but spouse stated pt is out. Pt last had refill tussionex 08/28/12 #120 x 5 refills. Last OV 08/28/12 and no pending appt. Please advise if okay to refill SN thanks

## 2013-03-18 NOTE — Telephone Encounter (Signed)
I have called RX into the pharmacy.  Pt spouse is aware. Nothing further was needed

## 2013-04-01 ENCOUNTER — Encounter: Payer: Self-pay | Admitting: Pulmonary Disease

## 2013-04-03 ENCOUNTER — Encounter: Payer: Self-pay | Admitting: Internal Medicine

## 2013-04-17 ENCOUNTER — Other Ambulatory Visit: Payer: Self-pay | Admitting: Pulmonary Disease

## 2013-05-29 ENCOUNTER — Ambulatory Visit (AMBULATORY_SURGERY_CENTER): Payer: Self-pay | Admitting: *Deleted

## 2013-05-29 VITALS — Ht 63.5 in | Wt 213.4 lb

## 2013-05-29 DIAGNOSIS — Z86010 Personal history of colonic polyps: Secondary | ICD-10-CM

## 2013-05-29 MED ORDER — PEG-KCL-NACL-NASULF-NA ASC-C 100 G PO SOLR: ORAL | Status: DC

## 2013-05-29 NOTE — Progress Notes (Signed)
No egg or soy allergy 

## 2013-05-31 ENCOUNTER — Encounter: Payer: Self-pay | Admitting: Internal Medicine

## 2013-06-03 ENCOUNTER — Telehealth: Payer: Self-pay | Admitting: Pulmonary Disease

## 2013-06-03 NOTE — Telephone Encounter (Signed)
I called [facility] to see when pt last had her tussionex refilled. It was on 04/23/13. RX was sent 03/18/13 #120 x 5 refills. D/T new law RX will have to be printed out and pt pick this up. Please advise if okay to refill SN thanks

## 2013-06-04 MED ORDER — HYDROCOD POLST-CHLORPHEN POLST 10-8 MG/5ML PO LQCR: 5.0000 mL | Freq: Two times a day (BID) | ORAL | Status: DC | PRN

## 2013-06-04 NOTE — Telephone Encounter (Signed)
Per SN---  Ok for the tussionex 8 oz   1 tsp every 12 hours as needed for cough.  This has been printed out and placed up front for the pt to come by and pick up. She can also use the delsym otc  2 tsp po every 12 hours as needed.

## 2013-06-04 NOTE — Telephone Encounter (Signed)
PT notified that rx for Tussionex was left at front desk

## 2013-06-04 NOTE — Telephone Encounter (Signed)
Called, spoke with pt's spouse.  Pt isn't available at this time.  He will ask she call us back.

## 2013-06-12 ENCOUNTER — Encounter: Payer: Self-pay | Admitting: Internal Medicine

## 2013-06-12 ENCOUNTER — Ambulatory Visit (AMBULATORY_SURGERY_CENTER): Payer: Medicare Other | Admitting: Internal Medicine

## 2013-06-12 VITALS — BP 123/71 | HR 82 | Temp 96.9°F | Resp 15 | Ht 63.5 in | Wt 213.0 lb

## 2013-06-12 DIAGNOSIS — Z860100 Personal history of colon polyps, unspecified: Secondary | ICD-10-CM

## 2013-06-12 DIAGNOSIS — D126 Benign neoplasm of colon, unspecified: Secondary | ICD-10-CM

## 2013-06-12 MED ORDER — SODIUM CHLORIDE 0.9 % IV SOLN: 500.0000 mL | INTRAVENOUS | Status: DC

## 2013-06-12 NOTE — Progress Notes (Signed)
Called to room to assist during endoscopic procedure.  Patient ID and intended procedure confirmed with present staff. Received instructions for my participation in the procedure from the performing physician.  

## 2013-06-12 NOTE — Op Note (Signed)
[facility] [address] [place], [address]   COLONOSCOPY PROCEDURE REPORT  PATIENT: [name], [name]  MR#: [id number] BIRTHDATE: February 23, 1946 , 67  yrs. old GENDER: Female ENDOSCOPIST: Hart Carwin, MD REFERRED OZ:DGUYQI Roberts, M.D. PROCEDURE DATE:  06/12/2013 PROCEDURE:   Colonoscopy with cold biopsy polypectomy First Screening Colonoscopy - Avg.  risk and is 50 yrs.  old or older - No.  Prior Negative Screening - Now for repeat screening. N/A  History of Adenoma - Now for follow-up colonoscopy & has been > or = to 3 yrs.  Yes hx of adenoma.  Has been 3 or more years since last colonoscopy.  Polyps Removed Today? Yes. ASA CLASS:   Class II INDICATIONS:colon polyp removed in 2006, no Path report available MEDICATIONS: MAC sedation, administered by CRNA and propofol (Diprivan) 250mg  IV  DESCRIPTION OF PROCEDURE:   After the risks benefits and alternatives of the procedure were thoroughly explained, informed consent was obtained.  A digital rectal exam revealed no abnormalities of the rectum.   The LB [id number]  endoscope was introduced through the anus and advanced to the cecum, which was identified by both the appendix and ileocecal valve. No adverse events experienced.   The quality of the prep was good, using MoviPrep  The instrument was then slowly withdrawn as the colon was fully examined.      COLON FINDINGS: Two polypoid shaped sessile polyps ranging between 3-96mm in size were found in the descending colon.  A polypectomy was performed with cold forceps.  The resection was complete and the polyp tissue was completely retrieved.  Retroflexed views revealed no abnormalities. The time to cecum=7 minutes 50 seconds. Withdrawal time=8 minutes 44 seconds.  The scope was withdrawn and the procedure completed. COMPLICATIONS: There were no complications.  ENDOSCOPIC IMPRESSION: Two sessile polyps ranging between 3-40mm in size were found in  the descending colon at 20 and 40 cm; polypectomy was performed with cold forceps  RECOMMENDATIONS: 1.  Await pathology results 2.  High fiber diet   eSigned:  Hart Carwin, MD 06/12/2013 9:09 AM   cc:   PATIENT NAME:  Wendy, Figueroa MR#: [id number]

## 2013-06-12 NOTE — Patient Instructions (Signed)
Discharge instructions given with verbal understanding. Handouts on polyps and a high fiber diet. Resume previous medications. YOU HAD AN ENDOSCOPIC PROCEDURE TODAY AT THE [facility]: Refer to the procedure report that was given to you for any specific questions about what was found during the examination.  If the procedure report does not answer your questions, please call your gastroenterologist to clarify.  If you requested that your care partner not be given the details of your procedure findings, then the procedure report has been included in a sealed envelope for you to review at your convenience later.  YOU SHOULD EXPECT: Some feelings of bloating in the abdomen. Passage of more gas than usual.  Walking can help get rid of the air that was put into your GI tract during the procedure and reduce the bloating. If you had a lower endoscopy (such as a colonoscopy or flexible sigmoidoscopy) you may notice spotting of blood in your stool or on the toilet paper. If you underwent a bowel prep for your procedure, then you may not have a normal bowel movement for a few days.  DIET: Your first meal following the procedure should be a light meal and then it is ok to progress to your normal diet.  A half-sandwich or bowl of soup is an example of a good first meal.  Heavy or fried foods are harder to digest and may make you feel nauseous or bloated.  Likewise meals heavy in dairy and vegetables can cause extra gas to form and this can also increase the bloating.  Drink plenty of fluids but you should avoid alcoholic beverages for 24 hours.  ACTIVITY: Your care partner should take you home directly after the procedure.  You should plan to take it easy, moving slowly for the rest of the day.  You can resume normal activity the day after the procedure however you should NOT DRIVE or use heavy machinery for 24 hours (because of the sedation medicines used during the test).    SYMPTOMS TO REPORT  IMMEDIATELY: A gastroenterologist can be reached at any hour.  During normal business hours, 8:30 AM to 5:00 PM Monday through Friday, call [phone].  After hours and on weekends, please call the GI answering service at [phone] who will take a message and have the physician on call contact you.   Following lower endoscopy (colonoscopy or flexible sigmoidoscopy):  Excessive amounts of blood in the stool  Significant tenderness or worsening of abdominal pains  Swelling of the abdomen that is new, acute  Fever of 100F or higher  FOLLOW UP: If any biopsies were taken you will be contacted by phone or by letter within the next 1-3 weeks.  Call your gastroenterologist if you have not heard about the biopsies in 3 weeks.  Our staff will call the home number listed on your records the next business day following your procedure to check on you and address any questions or concerns that you may have at that time regarding the information given to you following your procedure. This is a courtesy call and so if there is no answer at the home number and we have not heard from you through the emergency physician on call, we will assume that you have returned to your regular daily activities without incident.  SIGNATURES/CONFIDENTIALITY: You and/or your care partner have signed paperwork which will be entered into your electronic medical record.  These signatures attest to the fact that that the information above on your   After Visit Summary has been reviewed and is understood.  Full responsibility of the confidentiality of this discharge information lies with you and/or your care-partner. 

## 2013-06-12 NOTE — Progress Notes (Signed)
Patient did not experience any of the following events: a burn prior to discharge; a fall within the facility; wrong site/side/patient/procedure/implant event; or a hospital transfer or hospital admission upon discharge from the facility. (G8907) Patient did not have preoperative order for IV antibiotic SSI prophylaxis. (G8918)  

## 2013-06-13 ENCOUNTER — Telehealth: Payer: Self-pay

## 2013-06-13 NOTE — Telephone Encounter (Signed)
Left message on answering machine. 

## 2013-06-17 ENCOUNTER — Encounter: Payer: Self-pay | Admitting: Internal Medicine

## 2013-07-17 ENCOUNTER — Encounter: Payer: Self-pay | Admitting: Podiatry

## 2013-07-22 ENCOUNTER — Ambulatory Visit (INDEPENDENT_AMBULATORY_CARE_PROVIDER_SITE_OTHER): Payer: Medicare Other | Admitting: Podiatry

## 2013-07-22 ENCOUNTER — Encounter: Payer: Self-pay | Admitting: Podiatry

## 2013-07-22 VITALS — BP 152/84 | HR 78 | Resp 18

## 2013-07-22 DIAGNOSIS — B351 Tinea unguium: Secondary | ICD-10-CM

## 2013-07-22 DIAGNOSIS — M79609 Pain in unspecified limb: Secondary | ICD-10-CM

## 2013-07-22 NOTE — Progress Notes (Signed)
   Subjective:    Patient ID: Wendy Figueroa, female    DOB: 09-08-1945, 67 y.o.   MRN: [id number]  HPI trim my nails patient presents for ongoing debridement of symptomatic onychomycoses. She's been a patient in our praxis 2007.    Review of Systems     Objective:   Physical Exam 67 year old black female orientated x3. Incurvated, dystrophic, discolored, hypertrophic toenails x10 with palpable tenderness in all nail plates.       Assessment & Plan:  Assessment: Symptomatic onychomycoses  Plan: All 10 toenails are debrided back without any bleeding. Reappoint at three-month intervals.

## 2013-08-07 ENCOUNTER — Telehealth: Payer: Self-pay | Admitting: Pulmonary Disease

## 2013-08-07 MED ORDER — HYDROCOD POLST-CHLORPHEN POLST 10-8 MG/5ML PO LQCR: 5.0000 mL | Freq: Two times a day (BID) | ORAL | Status: DC | PRN

## 2013-08-07 NOTE — Telephone Encounter (Signed)
Per SN---  Ok to refill the tussionex.  #4oz  1 tsp every 12 hours as needed for cough. Called and spoke with pt and she will come by tomorrow to pick this up.

## 2013-08-07 NOTE — Telephone Encounter (Signed)
Pt is requesting a refill of the tussionex.  Please advise if ok to refill. Thanks   Allergies  Allergen Reactions  . Demerol Hives    All over the body   . Lisinopril-Hydrochlorothiazide Hives    All over the body  . Penicillins Hives    All over the body

## 2013-09-04 ENCOUNTER — Telehealth: Payer: Self-pay | Admitting: Pulmonary Disease

## 2013-09-04 MED ORDER — LOSARTAN POTASSIUM 50 MG PO TABS: 50.0000 mg | ORAL_TABLET | Freq: Every day | ORAL | Status: DC

## 2013-09-04 NOTE — Telephone Encounter (Signed)
Spoke with pt. She needed losartan refill sent. I have done so. Nothing further needed

## 2013-09-18 ENCOUNTER — Telehealth: Payer: Self-pay | Admitting: Pulmonary Disease

## 2013-09-18 MED ORDER — NIFEDIPINE ER OSMOTIC RELEASE 60 MG PO TB24: 60.0000 mg | ORAL_TABLET | Freq: Every day | ORAL | Status: DC

## 2013-09-18 MED ORDER — POTASSIUM CHLORIDE CRYS ER 20 MEQ PO TBCR: EXTENDED_RELEASE_TABLET | ORAL | Status: DC

## 2013-09-18 MED ORDER — PRAVASTATIN SODIUM 40 MG PO TABS: 40.0000 mg | ORAL_TABLET | Freq: Every day | ORAL | Status: DC

## 2013-09-18 MED ORDER — FUROSEMIDE 40 MG PO TABS: 40.0000 mg | ORAL_TABLET | Freq: Every day | ORAL | Status: DC

## 2013-09-18 NOTE — Telephone Encounter (Signed)
Pt has an appt on 10-07-13 with SN. Pt needs refill on potassium, lasix, procardia, and pravastatin. Refills sent.[place] Bing, CMA

## 2013-09-23 ENCOUNTER — Ambulatory Visit (INDEPENDENT_AMBULATORY_CARE_PROVIDER_SITE_OTHER): Payer: Medicare Other | Admitting: Pulmonary Disease

## 2013-09-23 ENCOUNTER — Telehealth: Payer: Self-pay | Admitting: Pulmonary Disease

## 2013-09-23 ENCOUNTER — Encounter: Payer: Self-pay | Admitting: Pulmonary Disease

## 2013-09-23 VITALS — BP 124/84 | HR 87 | Temp 97.1°F | Ht 64.0 in | Wt 217.0 lb

## 2013-09-23 DIAGNOSIS — D126 Benign neoplasm of colon, unspecified: Secondary | ICD-10-CM

## 2013-09-23 DIAGNOSIS — J45909 Unspecified asthma, uncomplicated: Secondary | ICD-10-CM

## 2013-09-23 DIAGNOSIS — E663 Overweight: Secondary | ICD-10-CM

## 2013-09-23 DIAGNOSIS — M899 Disorder of bone, unspecified: Secondary | ICD-10-CM

## 2013-09-23 DIAGNOSIS — K219 Gastro-esophageal reflux disease without esophagitis: Secondary | ICD-10-CM

## 2013-09-23 DIAGNOSIS — M1990 Unspecified osteoarthritis, unspecified site: Secondary | ICD-10-CM

## 2013-09-23 DIAGNOSIS — M25469 Effusion, unspecified knee: Secondary | ICD-10-CM

## 2013-09-23 DIAGNOSIS — J309 Allergic rhinitis, unspecified: Secondary | ICD-10-CM

## 2013-09-23 DIAGNOSIS — I10 Essential (primary) hypertension: Secondary | ICD-10-CM

## 2013-09-23 DIAGNOSIS — E785 Hyperlipidemia, unspecified: Secondary | ICD-10-CM

## 2013-09-23 DIAGNOSIS — M949 Disorder of cartilage, unspecified: Secondary | ICD-10-CM

## 2013-09-23 DIAGNOSIS — K5730 Diverticulosis of large intestine without perforation or abscess without bleeding: Secondary | ICD-10-CM

## 2013-09-23 DIAGNOSIS — F419 Anxiety disorder, unspecified: Secondary | ICD-10-CM

## 2013-09-23 DIAGNOSIS — M25561 Pain in right knee: Secondary | ICD-10-CM

## 2013-09-23 DIAGNOSIS — M25569 Pain in unspecified knee: Secondary | ICD-10-CM

## 2013-09-23 DIAGNOSIS — I872 Venous insufficiency (chronic) (peripheral): Secondary | ICD-10-CM

## 2013-09-23 MED ORDER — MELOXICAM 15 MG PO TABS: 15.0000 mg | ORAL_TABLET | Freq: Every day | ORAL | Status: DC

## 2013-09-23 MED ORDER — HYDROCOD POLST-CHLORPHEN POLST 10-8 MG/5ML PO LQCR: 5.0000 mL | Freq: Two times a day (BID) | ORAL | Status: DC | PRN

## 2013-09-23 NOTE — Telephone Encounter (Signed)
Called and spoke with pt. Apt scheduled to see SN at 12 today. Nothing further needed

## 2013-09-23 NOTE — Patient Instructions (Signed)
Today we updated your med list in our EPIC system...    Continue your current medications the same for now...  We added MOBIC (Meloxicam) 15mg  take one tab daily for arthritis pain...  We will arrange for an ASAP appt w/ [facility] to [place] your right knee & hopefully give you a shot for relief...  Be sure to elim all salt/sodium from your diet, keep your feet elevated as much as poss, & wear the support hose...    Continue the LASIX (Furosemide) 40mg  each AM...  Call for any questions...  Let's plan a follow up visit in 4-84mo, sooner if needed for problems.Marland KitchenMarland Kitchen

## 2013-09-26 ENCOUNTER — Other Ambulatory Visit (INDEPENDENT_AMBULATORY_CARE_PROVIDER_SITE_OTHER): Payer: Medicare Other

## 2013-09-26 DIAGNOSIS — F419 Anxiety disorder, unspecified: Secondary | ICD-10-CM

## 2013-09-26 DIAGNOSIS — D126 Benign neoplasm of colon, unspecified: Secondary | ICD-10-CM

## 2013-09-26 DIAGNOSIS — E785 Hyperlipidemia, unspecified: Secondary | ICD-10-CM

## 2013-09-26 DIAGNOSIS — F411 Generalized anxiety disorder: Secondary | ICD-10-CM

## 2013-09-26 DIAGNOSIS — I10 Essential (primary) hypertension: Secondary | ICD-10-CM

## 2013-09-26 LAB — HEPATIC FUNCTION PANEL
ALBUMIN: 3.9 g/dL (ref 3.5–5.2)
ALK PHOS: 145 U/L — AB (ref 39–117)
ALT: 21 U/L (ref 0–35)
AST: 25 U/L (ref 0–37)
Bilirubin, Direct: 0 mg/dL (ref 0.0–0.3)
TOTAL PROTEIN: 7.7 g/dL (ref 6.0–8.3)
Total Bilirubin: 0.6 mg/dL (ref 0.3–1.2)

## 2013-09-26 LAB — CBC WITH DIFFERENTIAL/PLATELET
BASOS ABS: 0 10*3/uL (ref 0.0–0.1)
Basophils Relative: 0.8 % (ref 0.0–3.0)
EOS ABS: 0.3 10*3/uL (ref 0.0–0.7)
Eosinophils Relative: 4.8 % (ref 0.0–5.0)
HCT: 39.8 % (ref 36.0–46.0)
Hemoglobin: 12.6 g/dL (ref 12.0–15.0)
Lymphocytes Relative: 45.7 % (ref 12.0–46.0)
Lymphs Abs: 2.4 10*3/uL (ref 0.7–4.0)
MCHC: 31.6 g/dL (ref 30.0–36.0)
MCV: 83.4 fl (ref 78.0–100.0)
MONO ABS: 0.4 10*3/uL (ref 0.1–1.0)
Monocytes Relative: 7.9 % (ref 3.0–12.0)
NEUTROS PCT: 40.8 % — AB (ref 43.0–77.0)
Neutro Abs: 2.2 10*3/uL (ref 1.4–7.7)
Platelets: 280 10*3/uL (ref 150.0–400.0)
RBC: 4.77 Mil/uL (ref 3.87–5.11)
RDW: 14.5 % (ref 11.5–14.6)
WBC: 5.3 10*3/uL (ref 4.5–10.5)

## 2013-09-26 LAB — TSH: TSH: 1.41 u[IU]/mL (ref 0.35–5.50)

## 2013-09-26 LAB — LIPID PANEL
CHOL/HDL RATIO: 3
Cholesterol: 151 mg/dL (ref 0–200)
HDL: 54.6 mg/dL (ref >39.00)
LDL CALC: 82 mg/dL (ref 0–99)
Triglycerides: 71 mg/dL (ref 0.0–149.0)
VLDL: 14.2 mg/dL (ref 0.0–40.0)

## 2013-09-26 LAB — BASIC METABOLIC PANEL
BUN: 11 mg/dL (ref 6–23)
CALCIUM: 9.7 mg/dL (ref 8.4–10.5)
CHLORIDE: 110 meq/L (ref 96–112)
CO2: 26 meq/L (ref 19–32)
Creatinine, Ser: 0.7 mg/dL (ref 0.4–1.2)
GFR: 99.81 mL/min (ref >60.00)
Glucose, Bld: 94 mg/dL (ref 70–99)
Potassium: 3.9 mEq/L (ref 3.5–5.1)
Sodium: 142 mEq/L (ref 135–145)

## 2013-09-27 ENCOUNTER — Telehealth: Payer: Self-pay | Admitting: Pulmonary Disease

## 2013-09-27 NOTE — Telephone Encounter (Signed)
Notes Recorded by Noralee Space, MD on 09/26/2013 at 4:33 PM Please notify patient>  FLP looks god on Prav40- Rec to get on better diet & get wt down! Chems are wnl; CBC is OK; Thyroid is wnl; Rec- continue current meds...   lmomtcb x 2  To discuss lab results

## 2013-09-27 NOTE — Telephone Encounter (Signed)
Pt is aware of lab results.

## 2013-10-03 ENCOUNTER — Telehealth: Payer: Self-pay | Admitting: Pulmonary Disease

## 2013-10-03 NOTE — Telephone Encounter (Signed)
Called and spoke with pt and she is aware of lab results per SN. Pt voiced her understanding and nothing further is needed 

## 2013-10-07 ENCOUNTER — Ambulatory Visit: Payer: Medicare Other | Admitting: Pulmonary Disease

## 2013-10-21 ENCOUNTER — Ambulatory Visit (INDEPENDENT_AMBULATORY_CARE_PROVIDER_SITE_OTHER): Payer: Medicare Other | Admitting: Podiatry

## 2013-10-21 ENCOUNTER — Encounter: Payer: Self-pay | Admitting: Podiatry

## 2013-10-21 VITALS — BP 127/63 | HR 83 | Resp 18

## 2013-10-21 DIAGNOSIS — B351 Tinea unguium: Secondary | ICD-10-CM

## 2013-10-21 DIAGNOSIS — M79609 Pain in unspecified limb: Secondary | ICD-10-CM

## 2013-10-22 NOTE — Progress Notes (Signed)
Patient ID: Wendy Figueroa, female   DOB: [birth year], 68 y.o.   MRN: [id number]  Subjective: Patient presents complaining of painful mycotic toenails. The last visit for this service service was on 07/22/2013.  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 Reappoint at three-month intervals

## 2013-11-11 ENCOUNTER — Telehealth: Payer: Self-pay | Admitting: Pulmonary Disease

## 2013-11-11 MED ORDER — FUROSEMIDE 40 MG PO TABS: 40.0000 mg | ORAL_TABLET | Freq: Every day | ORAL | Status: DC

## 2013-11-11 MED ORDER — POTASSIUM CHLORIDE CRYS ER 20 MEQ PO TBCR: EXTENDED_RELEASE_TABLET | ORAL | Status: DC

## 2013-11-11 MED ORDER — PRAVASTATIN SODIUM 40 MG PO TABS: 40.0000 mg | ORAL_TABLET | Freq: Every day | ORAL | Status: DC

## 2013-11-11 MED ORDER — NIFEDIPINE ER OSMOTIC RELEASE 60 MG PO TB24: 60.0000 mg | ORAL_TABLET | Freq: Every day | ORAL | Status: DC

## 2013-11-11 NOTE — Telephone Encounter (Signed)
Per SN----  Ok for refills.  thanks

## 2013-11-11 NOTE — Telephone Encounter (Signed)
Called spoke with pt. Confirmed medications needed to be sent. She reports when she was seen in feb she was not made aware SN is retiring and was not happy about this. She has been giving # to [facility] to schedule appt. She is requesting to pick up RX for tussionex. Please advise if okay to do so SN thanks

## 2013-11-12 MED ORDER — HYDROCOD POLST-CHLORPHEN POLST 10-8 MG/5ML PO LQCR: 5.0000 mL | Freq: Two times a day (BID) | ORAL | Status: DC | PRN

## 2013-11-12 NOTE — Telephone Encounter (Signed)
Called and spoke with pt and she is aware of rx that is ready to be picked up.  rx left up front for the pt.

## 2013-11-12 NOTE — Telephone Encounter (Signed)
RX printed for SN to sign for tussionex  lmtcb x1 for pt

## 2014-01-08 ENCOUNTER — Encounter: Payer: Self-pay | Admitting: Pulmonary Disease

## 2014-01-08 ENCOUNTER — Ambulatory Visit (INDEPENDENT_AMBULATORY_CARE_PROVIDER_SITE_OTHER)
Admission: RE | Admit: 2014-01-08 | Discharge: 2014-01-08 | Disposition: A | Discharge status: Home / Self Care | Payer: Medicare Other | Source: Ambulatory Visit | Attending: Pulmonary Disease | Admitting: Pulmonary Disease

## 2014-01-08 ENCOUNTER — Ambulatory Visit (INDEPENDENT_AMBULATORY_CARE_PROVIDER_SITE_OTHER): Payer: Medicare Other | Admitting: Pulmonary Disease

## 2014-01-08 VITALS — BP 146/82 | HR 88 | Temp 98.6°F | Ht 64.0 in | Wt 219.4 lb

## 2014-01-08 DIAGNOSIS — M949 Disorder of cartilage, unspecified: Secondary | ICD-10-CM

## 2014-01-08 DIAGNOSIS — J45909 Unspecified asthma, uncomplicated: Secondary | ICD-10-CM

## 2014-01-08 DIAGNOSIS — M1990 Unspecified osteoarthritis, unspecified site: Secondary | ICD-10-CM

## 2014-01-08 DIAGNOSIS — J309 Allergic rhinitis, unspecified: Secondary | ICD-10-CM

## 2014-01-08 DIAGNOSIS — M899 Disorder of bone, unspecified: Secondary | ICD-10-CM

## 2014-01-08 DIAGNOSIS — I10 Essential (primary) hypertension: Secondary | ICD-10-CM

## 2014-01-08 DIAGNOSIS — E785 Hyperlipidemia, unspecified: Secondary | ICD-10-CM

## 2014-01-08 DIAGNOSIS — I872 Venous insufficiency (chronic) (peripheral): Secondary | ICD-10-CM

## 2014-01-08 DIAGNOSIS — E663 Overweight: Secondary | ICD-10-CM

## 2014-01-08 DIAGNOSIS — K5730 Diverticulosis of large intestine without perforation or abscess without bleeding: Secondary | ICD-10-CM

## 2014-01-08 DIAGNOSIS — D126 Benign neoplasm of colon, unspecified: Secondary | ICD-10-CM

## 2014-01-08 DIAGNOSIS — K219 Gastro-esophageal reflux disease without esophagitis: Secondary | ICD-10-CM

## 2014-01-08 MED ORDER — HYDROCOD POLST-CHLORPHEN POLST 10-8 MG/5ML PO LQCR: 5.0000 mL | Freq: Two times a day (BID) | ORAL | Status: DC | PRN

## 2014-01-08 MED ORDER — METHYLPREDNISOLONE ACETATE 80 MG/ML IJ SUSP
80.0000 mg | Freq: Once | INTRAMUSCULAR | Status: AC
  Administered 2014-01-08: 80 mg via INTRAMUSCULAR

## 2014-01-08 MED ORDER — METHYLPREDNISOLONE (PAK) 4 MG PO TABS: ORAL_TABLET | ORAL | Status: DC

## 2014-01-08 NOTE — Patient Instructions (Signed)
Today we updated your med list in our EPIC system...    Continue your current medications the same...  Today we did your follow up CXR & Pulm Function Test...  We decided to give you a Depo shot & Medrol Dosepak for the airway inflammation...    Continue your Symbicort, Singulair, & Mucinex as prescribed...    We refilled your [place] for as needed use...  Call for any questions or if we can be of service in any way.Marland KitchenMarland Kitchen

## 2014-01-08 NOTE — Progress Notes (Signed)
Subjective:     Patient ID: Wendy Figueroa, female   DOB: 09/01/1945, 68 y.o.   MRN: [id number]  HPI 68 y/o BF here for a yearly follow up and med refills... he has multiple medical problems as noted below... She is an LPN...  ~  August 26, 2011:  22mo ROV & here "for my yearly check-up"> she reports persist cough- irritative, no sputum, no hemoptysis, no CP, no ch in chr DOE etc;  She even had an episode of post tussive syncope (no injury) & we discussed this, plus the need for TUSSIONEX Q12H as needed...  She notes rash on legs> ?etiology & we discussed trial Lidex-E cream & rec Derm eval if not resolved...  She's had the 2012 Flu vaccine, had fasting labs done today, needs refills...     AR> on Xyzal5, Singulair10, Omnaris, & allergy shots from [place]; seen 8/12 & his note is reviewed...    Asthma> on Symbicort160, Spiriva, NEBS w/ Albut, & Tussionex; she had an infectious exac 8/12 & saw DrSharma who gave her Depo/ Pred/ ZPak & improved...    HBP> on ProcXL60, Losar50, Lasix40, K20; BP= 136/80 & she denies CP, palpit, dizzy, ch in SOB/DOE, edema...    Chol> on Prav40 + FishOil; FLP looks good on diet + Rx- continue same...    Overweight> wt up 11# to 220# today; we reviewed diet, exercise, wt reduction strategies...    GI> GERD, Divertics, Polyps, Elev AlkPhos> on Zegerid & followed by DrDBrodie; last colonoscopy was 7/06 & follow up planned 77yrs.    DJD> on Mobic15 as needed; she has been seen by DrGioffre in the past w/ shot in the left knee which helped...  ~  November 15, 2011:  32mo ROV & add-on at her request for a lesion on her back- initially itched, now sl sore, & exam shows combination of SKs, cherry red spots, and a seb cyst> we discussed Atarax $RemoveBeforeDE'25mg'IkkfdRrujVDhYmr$  prn itching, and Doxycycline $RemoveBeforeD'100mg'FbPlTNHYsHYaVm$  bid x10d for the cyst - sl red, inflammed, r/o infection;  She is allergic to PCN w/ hives... We will refer to [facility] for poss excision of the cyst...  ~  August 28, 2012:  47mo ROV & Khiya reports a good  interval w/o new complaints or concerns...     AR> on Xyzal5, Singulair10, Omnaris, & allergy shots from [place]; seen 8/12 & his note is reviewed...    Asthma> on Symbicort160, NEBS w/ Albut, & Tussionex; she denies recent asthma exac, breathing well w/o wheezing, cough, SOB, etc...    HBP> on ProcXL60, Losar50, Lasix40, K20; BP= 140/70 & she denies CP, palpit, dizzy, ch in SOB/DOE, edema...    Chol> on Prav40 + FishOil; FLP shows TChol 162, TG 35, HDL 59, LDL 96    Overweight> wt down 5# to 215# today; we reviewed diet, exercise, wt reduction strategies...    GI> GERD, Divertics, Polyps, Elev AlkPhos> on Zegerid & followed by DrDBrodie; last colonoscopy was 7/06 & follow up planned 85yrs.    DJD> on Mobic15 as needed; she has been seen by DrGioffre in the past w/ shot in the left knee which helped... 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    Asthma> on Symbicort160, NEBS w/ Albut, & Tussionex; she denies recent asthma exac, breathing well w/o wheezing, cough, SOB, etc; requests refill Tussionex...    HBP> on ProcXL60, Losar50, Lasix40, K20; BP= 124/84 & she denies CP, palpit, dizzy, ch in SOB/DOE, edema...    Chol> on Prav40 + FishOil; FLP 2/15 shows TChol 151, TG 71, HDL 55, LDL 82    Overweight> wt up 2# to 217# today (BMI=37); we reviewed diet, exercise, wt reduction strategies...    GI> GERD, Divertics, Polyps, Elev AlkPhos> on Zegerid & followed by DrDBrodie; last colonoscopy was 7/06 & follow up planned 14yrs.    DJD> on Mobic15 as needed; she has  been seen by DrGioffre in the past w/ shot in the left knee which helped; now c/o right knee pain, we will Rx MOBIC15 7 refer to [facility] for XRays & eval... We reviewed prob list, meds, xrays and labs> see below for updates >> She had the 2014 flu vaccine in Oct2014... Requests refill tussionex...  LABS 2/15:  FLP- at goals on Prav40;  Chems- wnl x sl elev AlpPhos=145;  CBC- ok w/ Hg=12.6, MCV=83;  TSH=1.41...            Problem List:  ALLERGIC RHINITIS (ICD-477.9) - followed by DrSharma on shots weekly... also takes OMNARIS 2sp in each nostril daily,  SINGULAIR $RemoveBe'10mg'FONAIqXQf$ /d,  XYZAL $Remo'5mg'rGGxK$ /d... prev allergy tests + for trees, grass, ragweed, molds, dust, cat>dog, etc...  Hx of SINUSITIS (ICD-473.9) - no recent infections or antibiotics required.  ASTHMA (ICD-493.90) - Former pt of DrSlotnick yrs ago, also eval by [place] at [employer]... stable on SYMBICORT 160 2spBid, SPIRIVA daily, & VENTOLIN HFA as needed (DrSharma prescribes these meds)... she has min cough, small amt clear sputum and no recent wheezing, increased dyspnea, chest pain, etc... ~  CXR 11/10 showed norm heart size, ectatic/ calcif ao, clear lungs w/ min peribronch thickening, NAD...  ~  2/15: on Symbicort160, NEBS w/ Albut, & Tussionex; she denies recent asthma exac, breathing well w/o wheezing, cough, SOB, etc; requests refill Tussionex.  HYPERTENSION (ICD-401.9) - on PROCARDIA XL $RemoveBefo'60mg'XGdqZfBTFeT$ /d,  LOSARTAN50, LASIX $RemoveBeforeDEI'40mg'UjscWtUxbelVHdzw$ /d,  KCl 24mEq- 2/d... ~  2/12:  add-on for elev BP at [facility] exerc program> we added LOSARTAN $RemoveBefore'50mg'pzlGlvcbXShhm$ /d. ~  1/13:  BP= 136/80 and she denies HA, fatigue, visual changes, CP, palipit, dizziness, syncope, dyspnea, edema, etc... ~  1/14:  BP= 142/70 & she remains essentially asymptomatic...  ~  2/15: on ProcXL60, Losar50, Lasix40, K20; BP= 124/84 & she denies CP, palpit, dizzy, ch in SOB/DOE, edema.  VENOUS INSUFFICIENCY (ICD-459.81) - she follows low sodium diet, elevates legs, wears support hose, and takes the  Lasix...  HYPERCHOLESTEROLEMIA, BORDERLINE (ICD-272.4) - on PRAVASTATIN $RemoveBefore'40mg'qVlNCYYzzFjoF$ /d + low fat diet... ~  [place] 5/07 showed TChol 190, TG 68, HDL 56, LDL 120 ~  FLP 7/09 showed TChol 202, TG 53, HDL 52, LDL 141... rec> add Prav40. ~  [place] 11/10 on Prav40 showed TChol 171, TG 70, HDL 57, LDL 100 ~  FLP 12/11 on Prav40 showed TChol 150, TG 36, HDL 56, LDL 87 ~  FLP 1/13 on Prav40 showed TChol 162, TG 38, HDL 63, LDL 91 ~  FLP 1/14 on Prav40 showed TChol 162, TG 35, HDL 59, LDL 96 ~  FLP 2/15 on Prav40 showed TChol 151, TG 71, HDL 55, LDL 82   OVERWEIGHT (ICD-278.02) - discussed low carb/ low fat diet and increased exercise program... ~  weight 7/09 is down 5# to 203#... she knows that she needs to do better... ~  weight 11/10 = 212# ~  weight 12/11 = 215# ~  weight 2/12 = 209# ~  Weight 1/13 = 220# & we reviewed low carb, low fat, wt reducing diet! ~  Weight 1/14 = 215#  ~  Weight 2/15 = 217#  GERD (ICD-530.81) - on ZEGERID prn, & off prev reglan rx... EGD 9/02 showed sliding HH, mild gastritis, neg biopsy...  DIVERTICULOSIS OF COLON (ICD-562.10) + COLONIC POLYPS (ICD-211.3) >>  ~  Colonoscopy 7/06 by DrDBrodie showed extensive divertics, spasm, redund colon, hems, and sm polyp (no path avail)... ~  Colonoscopy 10/14 by DrDBrodie showed 2 polyps in desc colon (Path= tub adenoma) & f/u planned 67yrs...   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 she never ret for the GGT. ~  labs 11/10 showed AlkPhos= 195 ( 39-117), GGT= 54 (7-51)... continue to follow. ~  labs 12/11 showed AlkPhos= 158 ~  Labs 1/13 showed AlkPhos= 152 ~  Labs 1/14 showed AlkPhos= 146 ~  Labs 2/15 showed AlkPhos= 145  DEGENERATIVE JOINT DISEASE (ICD-715.90) ~  11/10: c/o left knee pain & XRay shows joint effusion- trial MOBIC $RemoveBef'15mg'zCUrTPrxvl$ /d & refer to Ortho> seen by DrGioffre & given knee injection.  DERM >> she has mult seborrheic keratoses, cherry red spots, and a sebaceous cyst on her  back==> referred to [facility]. ~  Onychomycosis w/ periodic nail debridement by [facility] -  GYN= DrARoberts... BMD at [facility] office 2005 was WNL w/ TScores -0.9 to +1.1; repeat 7/13 by Gyn was still wnl w/ lowest Tscore -0.9 in Spine... Mammograms neg at [facility] 7/14 (dense breasts)... s/p hyst, and A/P repair by DrRoberts 3/10... she had PNEUMOVAX in 2006 (age58) & repeated 12/11 (age64).   Past Surgical History  Procedure Laterality Date  . [place] / perigee repair  10/2008    Dr. Octavio Manns  . Breast surgery  1978    cyst removed from both removed  . Abdominal hysterectomy  1988  . Bladder tack  2011  . Anterior and posterior repair  11/05/08    Outpatient Encounter Prescriptions as of 09/23/2013  Medication Sig  . albuterol (PROVENTIL HFA;VENTOLIN HFA) 108 (90 BASE) MCG/ACT inhaler Inhale 2 puffs into the lungs every 6 (six) hours as needed.    . budesonide-formoterol (SYMBICORT) 160-4.5 MCG/ACT inhaler Inhale 2 puffs into the lungs 2 (two) times daily.    . cholecalciferol (VITAMIN D) 1000 UNITS tablet Take 1,000 Units by mouth 2 (two) times daily.    . ciclesonide (OMNARIS) 50 MCG/ACT nasal spray Place 2 sprays into both nostrils daily.    . hydrOXYzine (ATARAX/VISTARIL) 25 MG tablet TAKE 1 TABLET (25 MG TOTAL) BY MOUTH EVERY 6 (SIX) HOURS AS NEEDED FOR ITCHING.  . IRON PO Take by mouth daily.  Marland Kitchen levocetirizine (XYZAL) 5 MG tablet Take 5 mg by mouth every evening.    Marland Kitchen losartan (COZAAR) 50 MG tablet Take 1 tablet (50 mg total) by mouth daily.  . montelukast (SINGULAIR) 10 MG tablet Take 10 mg by mouth at bedtime.    . Multiple Vitamins-Minerals (MACULAR VITAMIN BENEFIT PO) Take 1 capsule by mouth 2 (two) times daily.    . Multiple Vitamins-Minerals (MULTIVITAMIN PO) Take  1 tablet by mouth daily.  . Omega-3 Fatty Acids (FISH OIL PO) Take by mouth daily.  Earney Navy Bicarbonate (ZEGERID) 20-1100 MG CAPS Take 1 capsule by mouth 2 (two) times daily.    .  [DISCONTINUED] chlorpheniramine-HYDROcodone (TUSSIONEX) 10-8 MG/5ML LQCR Take 5 mLs by mouth every 12 (twelve) hours as needed.  . [DISCONTINUED] chlorpheniramine-HYDROcodone (TUSSIONEX) 10-8 MG/5ML LQCR Take 5 mLs by mouth every 12 (twelve) hours as needed.  . [DISCONTINUED] furosemide (LASIX) 40 MG tablet Take 1 tablet (40 mg total) by mouth daily.  . [DISCONTINUED] NIFEdipine (PROCARDIA XL/ADALAT-CC) 60 MG 24 hr tablet Take 1 tablet (60 mg total) by mouth daily.  . [DISCONTINUED] potassium chloride SA (K-DUR,KLOR-CON) 20 MEQ tablet Take 2 tablets by mouth daily  . [DISCONTINUED] pravastatin (PRAVACHOL) 40 MG tablet Take 1 tablet (40 mg total) by mouth daily.  . meloxicam (MOBIC) 15 MG tablet Take 1 tablet (15 mg total) by mouth daily.    Allergies  Allergen Reactions  . Demerol Hives    All over the body   . Lisinopril-Hydrochlorothiazide Hives    All over the body  . Penicillins Hives    All over the body    Current Medications, Allergies, Past Medical History, Past Surgical History, Family History, and Social History were reviewed in [employer] record.    Review of Systems         See HPI - all other systems neg except as noted... The patient complains of dyspnea on exertion.  The patient denies anorexia, fever, weight loss, weight gain, vision loss, decreased hearing, hoarseness, chest pain, syncope, peripheral edema, prolonged cough, headaches, hemoptysis, abdominal pain, melena, hematochezia, severe indigestion/heartburn, hematuria, incontinence, muscle weakness, suspicious skin lesions, transient blindness, difficulty walking, depression, unusual weight change, abnormal bleeding, enlarged lymph nodes, and angioedema.   Objective:   Physical Exam     WD, WN, 68 y/o BF in NAD... GENERAL:  Alert & oriented; pleasant & cooperative... HEENT:  [place]/AT, EOM-wnl, PERRLA, EACs-clear, TMs-wnl, NOSE-clear, THROAT-clear & wnl. NECK:  Supple w/ fairROM; no JVD;  normal carotid impulses w/o bruits; no thyromegaly or nodules palpated; no lymphadenopathy. CHEST:  Clear to P & A; without wheezes/ rales/ or rhonchi heard... HEART:  Regular Rhythm; without murmurs/ rubs/ or gallops detected... ABDOMEN:  Soft & nontender; normal bowel sounds; no organomegaly or masses palpated.... EXT: without deformities, mild arthritic changes & decr ROM left knee; no varicose veins/ +venous insuffic/ 1+edema. NEURO:  CN's intact; no focal neuro deficits... DERM:  No lesions noted; no rash etc...  RADIOLOGY DATA:  Reviewed in the EPIC EMR & discussed w/ the patient...    >>Last CXR 11/10 showed clear lungs, sl incr markings, ectasia of Ao, NAD...  LABORATORY DATA:  Reviewed in the EPIC EMR & discussed w/ the patient...   Assessment:      AR>  On allergy shots & meds from [place]; continue same...  Asthma>  On meds as listed & regimen primarily from [place]; his notes are reviewed... She requests Tussionex refill.  HBP>  BP stable on meds; advised low sodium diet, take meds regularly, etc...  Chol> on Prav40 + FishOil; FLP looks good on diet + Rx- continue same...  Overweight> wt up to 217# today; we reviewed diet, exercise, wt reduction strategies...  GI> GERD, Divertics, Polyps, Elev AlkPhos> on Zegerid & followed by DrDBrodie; last colonoscopy was 10/14 w/ tub adenoma removed...  DJD> on Mobic15 as needed; she has been seen by DrGioffre in the past w/  shot that helped...     Plan:

## 2014-01-09 ENCOUNTER — Encounter: Payer: Self-pay | Admitting: Pulmonary Disease

## 2014-01-09 NOTE — Progress Notes (Signed)
Subjective:     Patient ID: Wendy Figueroa, female   DOB: [birth year], 68 y.o.   MRN: [id number]  HPI 68 y/o BF here for a yearly follow up and med refills... he has multiple medical problems as noted below... She is an LPN...  ~  November 15, 2011:  32mo ROV & add-on at her request for a lesion on her back- initially itched, now sl sore, & exam shows combination of SKs, cherry red spots, and a seb cyst> we discussed Atarax $RemoveBeforeDE'25mg'AcybDHuKroCUaGl$  prn itching, and Doxycycline $RemoveBeforeD'100mg'oeSkuKHNzysvEF$  bid x10d for the cyst - sl red, inflammed, r/o infection;  She is allergic to PCN w/ hives... We will refer to [facility] for poss excision of the cyst...  ~  August 28, 2012:  70mo ROV & Dallis reports a good interval w/o new complaints or concerns...     AR> on Xyzal5, Singulair10, Omnaris, & allergy shots from [place]; seen 8/12 & his note is reviewed...    Asthma> on Symbicort160, NEBS w/ Albut, & Tussionex; she denies recent asthma exac, breathing well w/o wheezing, cough, SOB, etc...    HBP> on ProcXL60, Losar50, Lasix40, K20; BP= 140/70 & she denies CP, palpit, dizzy, ch in SOB/DOE, edema...    Chol> on Prav40 + FishOil; FLP shows TChol 162, TG 35, HDL 59, LDL 96    Overweight> wt down 5# to 215# today; we reviewed diet, exercise, wt reduction strategies...    GI> GERD, Divertics, Polyps, Elev AlkPhos> on Zegerid & followed by DrDBrodie; last colonoscopy was 7/06 & follow up planned 57yrs.    DJD> on Mobic15 as needed; she has been seen by DrGioffre in the past w/ shot in the left knee which helped... 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    Asthma> on Symbicort160, NEBS w/ Albut, & Tussionex; she denies recent asthma exac, breathing well w/o wheezing, cough, SOB, etc; requests refill Tussionex...    HBP> on ProcXL60, Losar50, Lasix40, K20; BP= 124/84 & she denies CP, palpit, dizzy, ch in SOB/DOE, edema...    Chol> on Prav40 + FishOil; FLP 2/15 shows TChol 151, TG 71, HDL 55, LDL 82    Overweight> wt up 2# to 217# today (BMI=37); we reviewed diet, exercise, wt reduction strategies...    GI> GERD, Divertics, Polyps, Elev AlkPhos> on Zegerid & followed by DrDBrodie; last colonoscopy was 7/06 & follow up planned 79yrs.    DJD> on Mobic15 as needed; she has been seen by DrGioffre in the past w/ shot in the left knee which helped; now c/o right knee pain, we will Rx MOBIC15 7 refer to [facility] for XRays & eval... We reviewed prob list, meds, xrays and labs> see below for updates >> She had the 2014 flu vaccine in Oct2014... Requests refill tussionex...  LABS 2/15:  FLP- at goals on Prav40;  Chems- wnl x sl elev AlpPhos=145;  CBC- ok w/ Hg=12.6, MCV=83;  TSH=1.41...   ~  Jan 08, 2014:  3-77mo  ROV and Delorice is c/o 36mo hx "very bad allergies" despite her meds: Vertell Novak, Allergy shots, Singulair10, Symbicort160, ProairHFA, and Tussionex;  She called DrSharma "but he would not refill my Tussionex and I need it" she says;  She notes cough, congestion, min sput, no hemoptysis, sl SOB/DOE w/o change, and denies CP/ palpit/ edema;  She doesn't like Pred rx...  Exam w/ sl decr BS bilat but no wheezing;  She denies reflux symptoms on her Zegerid rx;  CXR is clear, and PF is superphysiologic but the tracing is inaccurate;  We decided to treat w/ Depo80, Medrol dosepak, and continue current regimen as outlined...     We reviewed prob list, meds, xrays and labs> see below for updates >> She has arranged for Primary Care [facility]...   CXR 5/15 showed normal heart size,  clear lungs, NAD...   PFT 5/15 showed FVC=2.94 (120%), FEV1=2.53 (135%), %1sec=86, mid-flows=219% predicted, but her tracing shows a hitch & the numbers are not believed to be accurate despite mult attempts...           Problem List:  ALLERGIC RHINITIS (ICD-477.9) - followed by DrSharma on shots weekly... also takes OMNARIS 2sp in each nostril daily,  SINGULAIR $RemoveBe'10mg'rePKmEQcJ$ /d,  XYZAL $Remo'5mg'LqVbj$ /d... prev allergy tests + for trees, grass, ragweed, molds, dust, cat>dog, etc...  Hx of SINUSITIS (ICD-473.9) - no recent infections or antibiotics required.  ASTHMA (ICD-493.90) - Former pt of DrSlotnick yrs ago, also eval by Melville at [employer] [employer]... stable on SYMBICORT 160 2spBid, SPIRIVA daily, & VENTOLIN HFA as needed (DrSharma prescribes these meds)... she has min cough, small amt clear sputum and no recent wheezing, increased dyspnea, chest pain, etc... ~  CXR 11/10 showed norm heart size, ectatic/ calcif ao, clear lungs w/ min peribronch thickening, NAD...  ~  2/15: on Symbicort160, NEBS w/ Albut, & Tussionex; she denies recent asthma exac, breathing well w/o wheezing, cough, SOB, etc; requests refill Tussionex. ~  5/15: c/o 61mo hx "very bad allergies" despite her meds: Xyzal5, Omnaris, Allergy shots, Singulair10, Symbicort160, ProairHFA, and Tussionex; she notes cough, congestion, min sput, no hemoptysis, sl SOB/DOE w/o change, and denies CP/ palpit/ edema;  Exam w/ sl decr BS bilat but no wheezing;  She denies reflux symptoms on her Zegerid rx;  CXR is clear, and PF is superphysiologic but the tracing is inaccurate;  We decided to treat w/ Depo80, Medrol dosepak, and continue current regimen as outlined...   CXR 5/15 showed normal heart size, clear lungs, NAD...   PFT 5/15 showed FVC=2.94 (120%), FEV1=2.53 (135%), %1sec=86, mid-flows=219% predicted, but her tracing shows a hitch & the numbers are not believed to be accurate despite mult attempts...  HYPERTENSION (ICD-401.9) - on PROCARDIA XL $RemoveBefo'60mg'VOpQvoeVKJT$ /d,   LOSARTAN50, LASIX $RemoveBeforeDEI'40mg'ZOlvqcGDHjpSfmFr$ /d,  KCl 12mEq- 2/d... ~  2/12:  add-on for elev BP at church exerc program> we added LOSARTAN $RemoveBefore'50mg'qtLTyvVDJlgQO$ /d. ~  1/13:  BP= 136/80 and she denies HA, fatigue, visual changes, CP, palipit, dizziness, syncope, dyspnea, edema, etc... ~  1/14:  BP= 142/70 & she remains essentially asymptomatic...  ~  2/15: on ProcXL60, Losar50, Lasix40, K20; BP= 124/84 & she denies CP, palpit, dizzy, ch in SOB/DOE, edema.  VENOUS INSUFFICIENCY (ICD-459.81) - she follows low sodium diet, elevates legs, wears support hose, and takes the Lasix...  HYPERCHOLESTEROLEMIA, BORDERLINE (ICD-272.4) - on PRAVASTATIN $RemoveBefore'40mg'yeWJSbCEIUsLl$ /d + low fat diet... ~  [employer] 5/07 showed TChol 190, TG 68, HDL 56, LDL 120 ~  FLP 7/09 showed TChol 202, TG  53, HDL 52, LDL 141... rec> add Prav40. ~  [place] 11/10 on Prav40 showed TChol 171, TG 70, HDL 57, LDL 100 ~  FLP 12/11 on Prav40 showed TChol 150, TG 36, HDL 56, LDL 87 ~  FLP 1/13 on Prav40 showed TChol 162, TG 38, HDL 63, LDL 91 ~  FLP 1/14 on Prav40 showed TChol 162, TG 35, HDL 59, LDL 96 ~  FLP 2/15 on Prav40 showed TChol 151, TG 71, HDL 55, LDL 82   OVERWEIGHT (ICD-278.02) - discussed low carb/ low fat diet and increased exercise program... ~  weight 7/09 is down 5# to 203#... she knows that she needs to do better... ~  weight 11/10 = 212# ~  weight 12/11 = 215# ~  weight 2/12 = 209# ~  Weight 1/13 = 220# & we reviewed low carb, low fat, wt reducing diet! ~  Weight 1/14 = 215#  ~  Weight 2/15 = 217#  GERD (ICD-530.81) - on ZEGERID prn, & off prev reglan rx... EGD 9/02 showed sliding HH, mild gastritis, neg biopsy...  DIVERTICULOSIS OF COLON (ICD-562.10) + COLONIC POLYPS (ICD-211.3) >>  ~  Colonoscopy 7/06 by DrDBrodie showed extensive divertics, spasm, redund colon, hems, and sm polyp (no path avail)... ~  Colonoscopy 10/14 by DrDBrodie showed 2 polyps in desc colon (Path= tub adenoma) & f/u planned 43yrs...   ALKALINE PHOSPHATASE, ELEVATED (ICD-790.5) ~  labs 7/09 showed  AlkPhos= 230 (39-117)... she never ret for the GGT. ~  labs 11/10 showed AlkPhos= 195 ( 39-117), GGT= 54 (7-51)... continue to follow. ~  labs 12/11 showed AlkPhos= 158 ~  Labs 1/13 showed AlkPhos= 152 ~  Labs 1/14 showed AlkPhos= 146 ~  Labs 2/15 showed AlkPhos= 145  DEGENERATIVE JOINT DISEASE (ICD-715.90) ~  11/10: c/o left knee pain & XRay shows joint effusion- trial MOBIC $RemoveBef'15mg'tDzMLIYjXc$ /d & refer to Ortho> seen by DrGioffre & given knee injection.  DERM >> she has mult seborrheic keratoses, cherry red spots, and a sebaceous cyst on her back==> referred to [facility]. ~  Onychomycosis w/ periodic nail debridement by [facility] -  GYN= DrARoberts... BMD [facility] 2005 was WNL w/ TScores -0.9 to +1.1; repeat 7/13 by Gyn was still wnl w/ lowest Tscore -0.9 in Spine... Mammograms neg at [facility] 7/14 (dense breasts)... s/p hyst, and A/P repair by DrRoberts 3/10... she had PNEUMOVAX in 2006 (age58) & repeated 12/11 (age64).   Past Surgical History  Procedure Laterality Date  . [place] / perigee repair  10/2008    Dr. Octavio Manns  . Breast surgery  1978    cyst removed from both removed  . Abdominal hysterectomy  1988  . Bladder tack  2011  . Anterior and posterior repair  11/05/08    Outpatient Encounter Prescriptions as of 01/08/2014  Medication Sig  . albuterol (PROVENTIL HFA;VENTOLIN HFA) 108 (90 BASE) MCG/ACT inhaler Inhale 2 puffs into the lungs every 6 (six) hours as needed.    . budesonide-formoterol (SYMBICORT) 160-4.5 MCG/ACT inhaler Inhale 2 puffs into the lungs 2 (two) times daily.    . chlorpheniramine-HYDROcodone (TUSSIONEX) 10-8 MG/5ML LQCR Take 5 mLs by mouth every 12 (twelve) hours as needed.  . cholecalciferol (VITAMIN D) 1000 UNITS tablet Take 1,000 Units by mouth 2 (two) times daily.    . ciclesonide (OMNARIS) 50 MCG/ACT nasal spray Place 2 sprays into both nostrils daily.    . furosemide (LASIX) 40 MG tablet Take 1 tablet (40 mg total) by mouth daily.    Marland Kitchen  hydrOXYzine (ATARAX/VISTARIL) 25 MG tablet TAKE 1 TABLET (25 MG TOTAL) BY MOUTH EVERY 6 (SIX) HOURS AS NEEDED FOR ITCHING.  . IRON PO Take by mouth daily.  Marland Kitchen levocetirizine (XYZAL) 5 MG tablet Take 5 mg by mouth every evening.    Marland Kitchen losartan (COZAAR) 50 MG tablet Take 1 tablet (50 mg total) by mouth daily.  . meloxicam (MOBIC) 15 MG tablet Take 1 tablet (15 mg total) by mouth daily.  . montelukast (SINGULAIR) 10 MG tablet Take 10 mg by mouth at bedtime.    . Multiple Vitamins-Minerals (MACULAR VITAMIN BENEFIT PO) Take 1 capsule by mouth 2 (two) times daily.    . Multiple Vitamins-Minerals (MULTIVITAMIN PO) Take 1 tablet by mouth daily.  Marland Kitchen NIFEdipine (PROCARDIA XL/ADALAT-CC) 60 MG 24 hr tablet Take 1 tablet (60 mg total) by mouth daily.  . NON FORMULARY Allergy shots once weekly  . Omega-3 Fatty Acids (FISH OIL PO) Take by mouth daily.  Earney Navy Bicarbonate (ZEGERID) 20-1100 MG CAPS Take 1 capsule by mouth 2 (two) times daily.    . potassium chloride SA (K-DUR,KLOR-CON) 20 MEQ tablet Take 2 tablets by mouth daily  . pravastatin (PRAVACHOL) 40 MG tablet Take 1 tablet (40 mg total) by mouth daily.  . [DISCONTINUED] chlorpheniramine-HYDROcodone (TUSSIONEX) 10-8 MG/5ML LQCR Take 5 mLs by mouth every 12 (twelve) hours as needed.  . methylPREDNIsolone (MEDROL DOSPACK) 4 MG tablet follow package directions  . [EXPIRED] methylPREDNISolone acetate (DEPO-MEDROL) injection 80 mg     Allergies  Allergen Reactions  . Demerol Hives    All over the body   . Lisinopril-Hydrochlorothiazide Hives    All over the body  . Penicillins Hives    All over the body    Current Medications, Allergies, Past Medical History, Past Surgical History, Family History, and Social History were reviewed in [employer] record.    Review of Systems         See HPI - all other systems neg except as noted... The patient complains of dyspnea on exertion.  The patient denies anorexia,  fever, weight loss, weight gain, vision loss, decreased hearing, hoarseness, chest pain, syncope, peripheral edema, prolonged cough, headaches, hemoptysis, abdominal pain, melena, hematochezia, severe indigestion/heartburn, hematuria, incontinence, muscle weakness, suspicious skin lesions, transient blindness, difficulty walking, depression, unusual weight change, abnormal bleeding, enlarged lymph nodes, and angioedema.   Objective:   Physical Exam     WD, WN, 68 y/o BF in NAD... GENERAL:  Alert & oriented; pleasant & cooperative... HEENT:  [place]/AT, EOM-wnl, PERRLA, EACs-clear, TMs-wnl, NOSE-clear, THROAT-clear & wnl. NECK:  Supple w/ fairROM; no JVD; normal carotid impulses w/o bruits; no thyromegaly or nodules palpated; no lymphadenopathy. CHEST:  Clear to P & A; without wheezes/ rales/ or rhonchi heard... HEART:  Regular Rhythm; without murmurs/ rubs/ or gallops detected... ABDOMEN:  Soft & nontender; normal bowel sounds; no organomegaly or masses palpated.... EXT: without deformities, mild arthritic changes & decr ROM left knee; no varicose veins/ +venous insuffic/ 1+edema. NEURO:  CN's intact; no focal neuro deficits... DERM:  No lesions noted; no rash etc...  RADIOLOGY DATA:  Reviewed in the EPIC EMR & discussed w/ the patient...  LABORATORY DATA:  Reviewed in the EPIC EMR & discussed w/ the patient...   Assessment:      AR>  On allergy shots & meds from [place]; continue same...  Asthma>  On meds as listed, but she cut back on Symbicort due to cost; Rec Depo80, Medrol dosepak, & she requests Tussionex refill-  ok...  HBP>  BP stable on meds; advised low sodium diet, take meds regularly, etc...  Chol> on Prav40 + FishOil; FLP looks good on diet + Rx- continue same...  Overweight> wt up to 219# today; we reviewed diet, exercise, wt reduction strategies...  GI> GERD, Divertics, Polyps, Elev AlkPhos> on Zegerid & followed by DrDBrodie; last colonoscopy was 10/14 w/ tub adenoma  removed...  DJD> on Mobic15 as needed & she was referred to [facility] for further eval...     Plan:     Patient's Medications  New Prescriptions   METHYLPREDNISOLONE (MEDROL DOSPACK) 4 MG TABLET    follow package directions  Previous Medications   ALBUTEROL (PROVENTIL HFA;VENTOLIN HFA) 108 (90 BASE) MCG/ACT INHALER    Inhale 2 puffs into the lungs every 6 (six) hours as needed.     BUDESONIDE-FORMOTEROL (SYMBICORT) 160-4.5 MCG/ACT INHALER    Inhale 2 puffs into the lungs 2 (two) times daily.     CHOLECALCIFEROL (VITAMIN D) 1000 UNITS TABLET    Take 1,000 Units by mouth 2 (two) times daily.     CICLESONIDE (OMNARIS) 50 MCG/ACT NASAL SPRAY    Place 2 sprays into both nostrils daily.     FUROSEMIDE (LASIX) 40 MG TABLET    Take 1 tablet (40 mg total) by mouth daily.   HYDROXYZINE (ATARAX/VISTARIL) 25 MG TABLET    TAKE 1 TABLET (25 MG TOTAL) BY MOUTH EVERY 6 (SIX) HOURS AS NEEDED FOR ITCHING.   IRON PO    Take by mouth daily.   LEVOCETIRIZINE (XYZAL) 5 MG TABLET    Take 5 mg by mouth every evening.     LOSARTAN (COZAAR) 50 MG TABLET    Take 1 tablet (50 mg total) by mouth daily.   MELOXICAM (MOBIC) 15 MG TABLET    Take 1 tablet (15 mg total) by mouth daily.   MONTELUKAST (SINGULAIR) 10 MG TABLET    Take 10 mg by mouth at bedtime.     MULTIPLE VITAMINS-MINERALS (MACULAR VITAMIN BENEFIT PO)    Take 1 capsule by mouth 2 (two) times daily.     MULTIPLE VITAMINS-MINERALS (MULTIVITAMIN PO)    Take 1 tablet by mouth daily.   NIFEDIPINE (PROCARDIA XL/ADALAT-CC) 60 MG 24 HR TABLET    Take 1 tablet (60 mg total) by mouth daily.   NON FORMULARY    Allergy shots once weekly   OMEGA-3 FATTY ACIDS (FISH OIL PO)    Take by mouth daily.   OMEPRAZOLE-SODIUM BICARBONATE (ZEGERID) 20-1100 MG CAPS    Take 1 capsule by mouth 2 (two) times daily.     POTASSIUM CHLORIDE SA (K-DUR,KLOR-CON) 20 MEQ TABLET    Take 2 tablets by mouth daily   PRAVASTATIN (PRAVACHOL) 40 MG TABLET    Take 1 tablet (40 mg total) by mouth  daily.  Modified Medications   Modified Medication Previous Medication   CHLORPHENIRAMINE-HYDROCODONE (TUSSIONEX) 10-8 MG/5ML LQCR chlorpheniramine-HYDROcodone (TUSSIONEX) 10-8 MG/5ML LQCR      Take 5 mLs by mouth every 12 (twelve) hours as needed.    Take 5 mLs by mouth every 12 (twelve) hours as needed.  Discontinued Medications   No medications on file

## 2014-01-27 ENCOUNTER — Ambulatory Visit (INDEPENDENT_AMBULATORY_CARE_PROVIDER_SITE_OTHER): Payer: Medicare Other | Admitting: Podiatry

## 2014-01-27 ENCOUNTER — Encounter: Payer: Self-pay | Admitting: Podiatry

## 2014-01-27 VITALS — BP 139/69 | HR 80 | Resp 18

## 2014-01-27 DIAGNOSIS — M79609 Pain in unspecified limb: Secondary | ICD-10-CM

## 2014-01-27 DIAGNOSIS — B351 Tinea unguium: Secondary | ICD-10-CM

## 2014-01-28 NOTE — Progress Notes (Signed)
Patient ID: Wendy Figueroa, female   DOB: [birth year], 68 y.o.   MRN: [id number] Subjective: This patient presents complaining of painful toenails  Objective: Incurvated, discolored, hypertrophic toenails x10  Assessment: Symptomatic onychomycoses times and  Plan: Debrided toenails x10 without a bleeding  Reappoint at three-month

## 2014-02-05 ENCOUNTER — Ambulatory Visit: Payer: Medicare Other | Admitting: Pulmonary Disease

## 2014-02-19 ENCOUNTER — Ambulatory Visit: Payer: Medicare Other | Admitting: Internal Medicine

## 2014-02-19 ENCOUNTER — Encounter: Payer: Self-pay | Admitting: Internal Medicine

## 2014-02-19 ENCOUNTER — Ambulatory Visit (INDEPENDENT_AMBULATORY_CARE_PROVIDER_SITE_OTHER): Payer: Medicare Other | Admitting: Internal Medicine

## 2014-02-19 VITALS — BP 148/84 | HR 84 | Temp 98.3°F | Ht 63.0 in | Wt 218.0 lb

## 2014-02-19 DIAGNOSIS — I872 Venous insufficiency (chronic) (peripheral): Secondary | ICD-10-CM

## 2014-02-19 DIAGNOSIS — M949 Disorder of cartilage, unspecified: Secondary | ICD-10-CM

## 2014-02-19 DIAGNOSIS — D509 Iron deficiency anemia, unspecified: Secondary | ICD-10-CM

## 2014-02-19 DIAGNOSIS — E663 Overweight: Secondary | ICD-10-CM

## 2014-02-19 DIAGNOSIS — J45909 Unspecified asthma, uncomplicated: Secondary | ICD-10-CM

## 2014-02-19 DIAGNOSIS — E559 Vitamin D deficiency, unspecified: Secondary | ICD-10-CM

## 2014-02-19 DIAGNOSIS — M899 Disorder of bone, unspecified: Secondary | ICD-10-CM

## 2014-02-19 DIAGNOSIS — I10 Essential (primary) hypertension: Secondary | ICD-10-CM

## 2014-02-19 DIAGNOSIS — K219 Gastro-esophageal reflux disease without esophagitis: Secondary | ICD-10-CM

## 2014-02-19 DIAGNOSIS — E785 Hyperlipidemia, unspecified: Secondary | ICD-10-CM

## 2014-02-19 DIAGNOSIS — J309 Allergic rhinitis, unspecified: Secondary | ICD-10-CM

## 2014-02-19 MED ORDER — PRAVASTATIN SODIUM 40 MG PO TABS: 40.0000 mg | ORAL_TABLET | Freq: Every day | ORAL | Status: DC

## 2014-02-19 MED ORDER — FUROSEMIDE 40 MG PO TABS: 40.0000 mg | ORAL_TABLET | Freq: Every day | ORAL | Status: DC

## 2014-02-19 MED ORDER — OMEPRAZOLE-SODIUM BICARBONATE 20-1100 MG PO CAPS: 1.0000 | ORAL_CAPSULE | Freq: Every day | ORAL | Status: DC

## 2014-02-19 MED ORDER — POTASSIUM CHLORIDE CRYS ER 20 MEQ PO TBCR: EXTENDED_RELEASE_TABLET | ORAL | Status: DC

## 2014-02-19 MED ORDER — LOSARTAN POTASSIUM 50 MG PO TABS: 50.0000 mg | ORAL_TABLET | Freq: Every day | ORAL | Status: DC

## 2014-02-19 MED ORDER — NIFEDIPINE ER OSMOTIC RELEASE 60 MG PO TB24: 60.0000 mg | ORAL_TABLET | Freq: Every day | ORAL | Status: DC

## 2014-02-19 NOTE — Progress Notes (Signed)
Patient ID: Wendy Figueroa, female   DOB: Dec 24, 1945, 68 y.o.   MRN: [id number]    Chief Complaint  Patient presents with  . Establish Care    new patient   Allergies  Allergen Reactions  . Demerol Hives    All over the body   . Lisinopril-Hydrochlorothiazide Hives    All over the body  . Penicillins Hives    All over the body   HPI 68 y/o female patient is seen today to establish care. She has history of asthma (never intubated or hospitalized), HTN, hyperlipidemia and allergic rhinitis. She sees Dr [name] for pulmonary issues and Dr Donneta Romberg for allergy issues. Did not have a PCP for last 10 years. She is LPN and works at [employer] She denies any concerns today She  Needs medication refills and gets it from mail in pharmacy as 90 days supply  Review of Systems  Constitutional: Negative for fever, chills, malaise/fatigue and diaphoresis. has been gaining weight HENT: Negative for congestion, hearing loss and sore throat. Has allergic rhinitis  Eyes: Negative for blurred vision, double vision and discharge. Wears corrective glasses. Last eye exam 3 weeks bacl  Respiratory: Negative for sputum production, shortness of breath and wheezing.  has cough occassionally Cardiovascular: Negative for chest pain, palpitations, orthopnea and PND. Uses 2-3 pillows at baseline. has leg swelling and wears ted hose Gastrointestinal: Negative for heartburn, nausea, vomiting, abdominal pain, diarrhea. Has occassional constipation and taking fruits and stool softener helps. Denies melena or rectal bleed  Genitourinary: Negative for dysuria, frequency and flank pain. positive for urgency Musculoskeletal: Negative for back pain, falls, joint pain and myalgias.  Skin: Negative for itching and rash.  Neurological: Negative for dizziness, tingling, numbness, focal weakness and headaches.  Psychiatric/Behavioral: Negative for depression and memory loss. The patient is not nervous/anxious.    Past Medical  History  Diagnosis Date  . Allergic rhinitis   . Sinusitis   . Asthma   . Hypertension   . Venous insufficiency   . Hypercholesterolemia   . Overweight(278.02)   . GERD (gastroesophageal reflux disease)   . Diverticulosis of colon   . Hx of colonic polyps   . Elevated alkaline phosphatase level   . Sebaceous cyst     on back  . Wheezing     occasional  . Cough     occasional  . Leg swelling     occasional  . H/O urinary incontinence 11/06/2008  . Rectocele 11/06/2008    Large  . Cystocele 11/05/08  . Hard measles   . H/O varicella   . Yeast infection   . Abnormal Pap smear     Over 6yrs ago  . Anemia   . DJD (degenerative joint disease)     no per pt   Past Surgical History  Procedure Laterality Date  . Apogee / perigee repair  10/2008    Dr. Octavio Manns  . Breast surgery  1978    cyst removed from both removed  . Abdominal hysterectomy  1988  . Bladder tack  2011  . Anterior and posterior repair  11/05/08   Current Outpatient Prescriptions on File Prior to Visit  Medication Sig Dispense Refill  . albuterol (PROVENTIL HFA;VENTOLIN HFA) 108 (90 BASE) MCG/ACT inhaler Inhale 2 puffs into the lungs every 6 (six) hours as needed.        . budesonide-formoterol (SYMBICORT) 160-4.5 MCG/ACT inhaler Inhale 2 puffs into the lungs 2 (two) times daily.        Marland Kitchen  chlorpheniramine-HYDROcodone (TUSSIONEX) 10-8 MG/5ML LQCR Take 5 mLs by mouth every 12 (twelve) hours as needed.  180 mL  0  . cholecalciferol (VITAMIN D) 1000 UNITS tablet Take 1,000 Units by mouth 2 (two) times daily.        . ciclesonide (OMNARIS) 50 MCG/ACT nasal spray Place 2 sprays into both nostrils daily.        . furosemide (LASIX) 40 MG tablet Take 1 tablet (40 mg total) by mouth daily.  90 tablet  0  . hydrOXYzine (ATARAX/VISTARIL) 25 MG tablet TAKE 1 TABLET (25 MG TOTAL) BY MOUTH EVERY 6 (SIX) HOURS AS NEEDED FOR ITCHING.  50 tablet  1  . IRON PO Take by mouth daily.      Marland Kitchen levocetirizine (XYZAL) 5 MG tablet  Take 5 mg by mouth every evening.        Marland Kitchen losartan (COZAAR) 50 MG tablet Take 1 tablet (50 mg total) by mouth daily.  90 tablet  3  . montelukast (SINGULAIR) 10 MG tablet Take 10 mg by mouth at bedtime.        . Multiple Vitamins-Minerals (MACULAR VITAMIN BENEFIT PO) Take 1 capsule by mouth 2 (two) times daily.        Marland Kitchen NIFEdipine (PROCARDIA XL/ADALAT-CC) 60 MG 24 hr tablet Take 1 tablet (60 mg total) by mouth daily.  90 tablet  0  . NON FORMULARY Allergy shots once weekly      . Omega-3 Fatty Acids (FISH OIL PO) Take by mouth daily.      Earney Navy Bicarbonate (ZEGERID) 20-1100 MG CAPS Take 1 capsule by mouth. Take one daily      . potassium chloride SA (K-DUR,KLOR-CON) 20 MEQ tablet Take 2 tablets by mouth daily  180 tablet  0  . pravastatin (PRAVACHOL) 40 MG tablet Take 1 tablet (40 mg total) by mouth daily.  90 tablet  0   No current facility-administered medications on file prior to visit.   History   Social History  . Marital Status: Married    Spouse Name: [name]    Number of Children: 2  . Years of Education: N/A   Occupational History  . LPN     Social History Main Topics  . Smoking status: Never Smoker   . Smokeless tobacco: Never Used  . Alcohol Use: Yes     Comment: social use  . Drug Use: No  . Sexual Activity: Not on file   Other Topics Concern  . Not on file   Social History Narrative   Married   Never smoked   Alcohol few beers on week-end   Exercise walk 5 days a week for one hour   No Advance Directive    Family History  Problem Relation Age of Onset  . Aneurysm Mother   . Lung cancer Father   . Colon cancer Neg Hx   . Esophageal cancer Neg Hx   . Stomach cancer Neg Hx   . Rectal cancer Neg Hx   . Hypertension Daughter   . Hypertension Daughter    Physical exam BP 148/84  Pulse 84  Temp(Src) 98.3 F (36.8 C) (Oral)  Ht $R'5\' 3"'lC$  (1.6 m)  Wt 218 lb (98.884 kg)  BMI 38.63 kg/m2  SpO2 92%  General- elderly female in no acute  distress, overweight Head- atraumatic, normocephalic Eyes- PERRLA, EOMI, no pallor, no icterus, no discharge Neck- no lymphadenopathy, no thyromegaly, no jugular vein distension, no carotid bruit Nose- normal nasaal mucosa, no maxillary sinus tenderness,  no frontal sinus tenderness Mouth- normal mucus membrane, no oral thrush, normal oropharynx Cardiovascular- normal s1,s2, no murmurs/ rubs/ gallops, normal distal pulses, 1+ edema in both legs Respiratory- bilateral clear to auscultation, no wheeze, no rhonchi, no crackles Abdomen- bowel sounds present, soft, non tender Musculoskeletal- able to move all 4 extremities, no spinal and paraspinal tenderness, steady gait, no use of assistive device, normal range of motion Neurological- no focal deficit Skin- warm and dry Psychiatry- alert and oriented to person, place and time, normal mood and affect  Labs- Lab Results  Component Value Date   WBC 5.3 09/26/2013   HGB 12.6 09/26/2013   HCT 39.8 09/26/2013   MCV 83.4 09/26/2013   PLT 280.0 09/26/2013   CMP     Component Value Date/Time   NA 142 09/26/2013 0849   K 3.9 09/26/2013 0849   CL 110 09/26/2013 0849   CO2 26 09/26/2013 0849   GLUCOSE 94 09/26/2013 0849   BUN 11 09/26/2013 0849   CREATININE 0.7 09/26/2013 0849   CALCIUM 9.7 09/26/2013 0849   PROT 7.7 09/26/2013 0849   ALBUMIN 3.9 09/26/2013 0849   AST 25 09/26/2013 0849   ALT 21 09/26/2013 0849   ALKPHOS 145* 09/26/2013 0849   BILITOT 0.6 09/26/2013 0849   GFRNONAA 112.99 08/02/2010 0843   GFRAA  Value: >60        The eGFR has been calculated using the MDRD equation. This calculation has not been validated in all clinical situations. eGFR's persistently <60 mL/min signify possible Chronic Kidney Disease. 11/06/2008 0507   Lipid Panel     Component Value Date/Time   CHOL 151 09/26/2013 0849   TRIG 71.0 09/26/2013 0849   HDL 54.60 09/26/2013 0849   CHOLHDL 3 09/26/2013 0849   VLDL 14.2 09/26/2013 0849   LDLCALC 82 09/26/2013 0849    Assessment/plan  1.  HYPERTENSION Stable bp reading in office. Continue lasix 40 mg daily with cozaar 50 mg daily, nifedipine 60 mg daily and kcl supplement - CBC with Differential; Future - CMP; Future - Lipid Panel; Future - Hemoglobin A1c; Future - TSH; Future  2. VENOUS INSUFFICIENCY Continue ted hose and lasix for now. Check bmp prior to next visit  3. ALLERGIC RHINITIS Continue her singulair, cetirizine and nasal spray  4. ASTHMA Stable on singulair, albuterol prn and symbicort  5. Gastroesophageal reflux disease, esophagitis presence not specified Stable, continue zegerid  6. OSTEOPENIA Stable, continue ca-vit d supplement.  7. HYPERCHOLESTEROLEMIA, BORDERLINE Check lipid panel, continue fish oil and pravachol current regimen - Lipid Panel; Future - Hemoglobin A1c; Future  8. OVERWEIGHT Exercise, weight loss program reviewed. Rule out DM with her overweight and HTN and HL history - Hemoglobin A1c; Future - TSH; Future  9. Anemia, iron deficiency - CBC with Differential; Future - Iron and TIBC; Future - continue iron supplement  10. Unspecified vitamin D deficiency Continue vit d supplement, check vit d - Vitamin D, 1,25-dihydroxy; Future

## 2014-02-27 ENCOUNTER — Telehealth: Payer: Self-pay | Admitting: Pulmonary Disease

## 2014-02-27 MED ORDER — HYDROCOD POLST-CHLORPHEN POLST 10-8 MG/5ML PO LQCR: 5.0000 mL | Freq: Two times a day (BID) | ORAL | Status: DC | PRN

## 2014-02-27 NOTE — Telephone Encounter (Signed)
Pt returned call. Pt stated she needed a refill on her tussinex. Pt aware that she will need to pick up rx to take in to pharamacy. Pt states she can pick up rx tomorrow.   Rx placed on SN's cart to be signed. Will forward to [place] per her request.

## 2014-02-27 NOTE — Telephone Encounter (Signed)
lmomtcb x1 

## 2014-02-27 NOTE — Telephone Encounter (Signed)
I called spoke pt.a ware RX left for pick up. Nothing further needed

## 2014-04-07 ENCOUNTER — Ambulatory Visit: Payer: Medicare Other | Admitting: Family Medicine

## 2014-04-21 ENCOUNTER — Telehealth: Payer: Self-pay | Admitting: Pulmonary Disease

## 2014-04-21 MED ORDER — HYDROCOD POLST-CHLORPHEN POLST 10-8 MG/5ML PO LQCR: 5.0000 mL | Freq: Two times a day (BID) | ORAL | Status: DC | PRN

## 2014-04-21 NOTE — Telephone Encounter (Signed)
Per SN--  [facility].  Last seen 5/15 but ok to refill the tussionex.  Will print out the rx and call the pt once this is ready to be picked up.

## 2014-04-21 NOTE — Telephone Encounter (Signed)
rx has been signed and pt is aware. Nothing further is needed.

## 2014-04-21 NOTE — Telephone Encounter (Signed)
SN pt is requesting a refill of the tussionex.  Please advise. Thanks pts new PCP is Dr. Bubba Camp  Allergies  Allergen Reactions  . Demerol Hives    All over the body   . Lisinopril-Hydrochlorothiazide Hives    All over the body  . Penicillins Hives    All over the body    Current Outpatient Prescriptions on File Prior to Visit  Medication Sig Dispense Refill  . albuterol (PROVENTIL HFA;VENTOLIN HFA) 108 (90 BASE) MCG/ACT inhaler Inhale 2 puffs into the lungs every 6 (six) hours as needed.        . budesonide-formoterol (SYMBICORT) 160-4.5 MCG/ACT inhaler Inhale 2 puffs into the lungs 2 (two) times daily.        . Calcium Carb-Cholecalciferol (CALCIUM 600 + D PO) Take by mouth. Take one tablet daily      . chlorpheniramine-HYDROcodone (TUSSIONEX) 10-8 MG/5ML LQCR Take 5 mLs by mouth every 12 (twelve) hours as needed.  180 mL  0  . cholecalciferol (VITAMIN D) 1000 UNITS tablet Take 1,000 Units by mouth 2 (two) times daily.        . ciclesonide (OMNARIS) 50 MCG/ACT nasal spray Place 2 sprays into both nostrils daily.        . furosemide (LASIX) 40 MG tablet Take 1 tablet (40 mg total) by mouth daily.  90 tablet  3  . hydrOXYzine (ATARAX/VISTARIL) 25 MG tablet TAKE 1 TABLET (25 MG TOTAL) BY MOUTH EVERY 6 (SIX) HOURS AS NEEDED FOR ITCHING.  50 tablet  1  . IRON PO Take by mouth daily.      Marland Kitchen levocetirizine (XYZAL) 5 MG tablet Take 5 mg by mouth every evening.        Marland Kitchen losartan (COZAAR) 50 MG tablet Take 1 tablet (50 mg total) by mouth daily.  90 tablet  3  . montelukast (SINGULAIR) 10 MG tablet Take 10 mg by mouth at bedtime.        . Multiple Vitamins-Minerals (MACULAR VITAMIN BENEFIT PO) Take 1 capsule by mouth 2 (two) times daily.        Marland Kitchen NIFEdipine (PROCARDIA XL/ADALAT-CC) 60 MG 24 hr tablet Take 1 tablet (60 mg total) by mouth daily.  90 tablet  3  . NON FORMULARY Allergy shots once weekly      . Omega-3 Fatty Acids (FISH OIL PO) Take by mouth daily.      Earney Navy  Bicarbonate (ZEGERID) 20-1100 MG CAPS capsule Take 1 capsule by mouth daily before breakfast. Take one daily  90 each  3  . potassium chloride SA (K-DUR,KLOR-CON) 20 MEQ tablet Take 2 tablets by mouth daily  180 tablet  0  . pravastatin (PRAVACHOL) 40 MG tablet Take 1 tablet (40 mg total) by mouth daily.  90 tablet  3   No current facility-administered medications on file prior to visit.

## 2014-04-30 ENCOUNTER — Ambulatory Visit: Payer: Medicare Other | Admitting: Podiatry

## 2014-05-12 ENCOUNTER — Ambulatory Visit (INDEPENDENT_AMBULATORY_CARE_PROVIDER_SITE_OTHER): Payer: Medicare Other | Admitting: Podiatry

## 2014-05-12 ENCOUNTER — Encounter: Payer: Self-pay | Admitting: Podiatry

## 2014-05-12 VITALS — BP 147/86 | HR 84 | Resp 18

## 2014-05-12 DIAGNOSIS — M79676 Pain in unspecified toe(s): Secondary | ICD-10-CM

## 2014-05-12 DIAGNOSIS — M79609 Pain in unspecified limb: Secondary | ICD-10-CM

## 2014-05-12 DIAGNOSIS — B351 Tinea unguium: Secondary | ICD-10-CM

## 2014-05-13 NOTE — Progress Notes (Signed)
Patient ID: Wendy Figueroa, female   DOB: [birth year], 68 y.o.   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

## 2014-05-26 ENCOUNTER — Other Ambulatory Visit: Payer: Medicare Other

## 2014-05-28 LAB — HEMOGLOBIN A1C
ESTIMATED AVERAGE GLUCOSE: 114 mg/dL
Hgb A1c MFr Bld: 5.6 % (ref 4.8–5.6)

## 2014-05-28 LAB — COMPREHENSIVE METABOLIC PANEL
ALT: 21 IU/L (ref 0–32)
AST: 27 IU/L (ref 0–40)
Albumin/Globulin Ratio: 1.8 (ref 1.1–2.5)
Albumin: 4.4 g/dL (ref 3.5–4.8)
Alkaline Phosphatase: 168 IU/L — ABNORMAL HIGH (ref 39–117)
BUN/Creatinine Ratio: 17 (ref 11–26)
BUN: 13 mg/dL (ref 8–27)
CHLORIDE: 102 mmol/L (ref 97–108)
CO2: 23 mmol/L (ref 18–29)
Calcium: 9.5 mg/dL (ref 8.7–10.3)
Creatinine, Ser: 0.78 mg/dL (ref 0.57–1.00)
GFR calc Af Amer: 89 mL/min/{1.73_m2} (ref >59)
GFR calc non Af Amer: 77 mL/min/{1.73_m2} (ref >59)
GLOBULIN, TOTAL: 2.5 g/dL (ref 1.5–4.5)
Glucose: 98 mg/dL (ref 65–99)
Potassium: 3.9 mmol/L (ref 3.5–5.2)
Sodium: 142 mmol/L (ref 134–144)
TOTAL PROTEIN: 6.9 g/dL (ref 6.0–8.5)
Total Bilirubin: 0.4 mg/dL (ref 0.0–1.2)

## 2014-05-28 LAB — VITAMIN D 1,25 DIHYDROXY: VIT D 1 25 DIHYDROXY: 96.6 pg/mL — AB (ref 19.9–79.3)

## 2014-05-28 LAB — TSH: TSH: 1.3 u[IU]/mL (ref 0.450–4.500)

## 2014-05-28 LAB — CBC WITH DIFFERENTIAL/PLATELET
BASOS: 1 %
Basophils Absolute: 0 10*3/uL (ref 0.0–0.2)
Eos: 5 %
Eosinophils Absolute: 0.3 10*3/uL (ref 0.0–0.4)
HCT: 36.8 % (ref 34.0–46.6)
Hemoglobin: 12.1 g/dL (ref 11.1–15.9)
IMMATURE GRANULOCYTES: 0 %
Immature Grans (Abs): 0 10*3/uL (ref 0.0–0.1)
Lymphocytes Absolute: 2.2 10*3/uL (ref 0.7–3.1)
Lymphs: 39 %
MCH: 26.1 pg — ABNORMAL LOW (ref 26.6–33.0)
MCHC: 32.9 g/dL (ref 31.5–35.7)
MCV: 80 fL (ref 79–97)
MONOCYTES: 10 %
MONOS ABS: 0.6 10*3/uL (ref 0.1–0.9)
NEUTROS PCT: 45 %
Neutrophils Absolute: 2.5 10*3/uL (ref 1.4–7.0)
RBC: 4.63 x10E6/uL (ref 3.77–5.28)
RDW: 14.3 % (ref 12.3–15.4)
WBC: 5.6 10*3/uL (ref 3.4–10.8)

## 2014-05-28 LAB — IRON AND TIBC
Iron Saturation: 18 % (ref 15–55)
Iron: 53 ug/dL (ref 35–155)
TIBC: 291 ug/dL (ref 250–450)
UIBC: 238 ug/dL (ref 150–375)

## 2014-05-28 LAB — LIPID PANEL
CHOL/HDL RATIO: 2.4 ratio (ref 0.0–4.4)
CHOLESTEROL TOTAL: 172 mg/dL (ref 100–199)
HDL: 71 mg/dL (ref >39)
LDL CALC: 91 mg/dL (ref 0–99)
Triglycerides: 51 mg/dL (ref 0–149)
VLDL CHOLESTEROL CAL: 10 mg/dL (ref 5–40)

## 2014-06-04 ENCOUNTER — Ambulatory Visit (INDEPENDENT_AMBULATORY_CARE_PROVIDER_SITE_OTHER): Payer: Medicare Other | Admitting: Internal Medicine

## 2014-06-04 ENCOUNTER — Encounter: Payer: Self-pay | Admitting: Internal Medicine

## 2014-06-04 VITALS — BP 142/80 | HR 94 | Temp 98.2°F | Resp 20 | Ht 63.0 in | Wt 225.2 lb

## 2014-06-04 DIAGNOSIS — J4520 Mild intermittent asthma, uncomplicated: Secondary | ICD-10-CM

## 2014-06-04 DIAGNOSIS — M899 Disorder of bone, unspecified: Secondary | ICD-10-CM

## 2014-06-04 DIAGNOSIS — E785 Hyperlipidemia, unspecified: Secondary | ICD-10-CM

## 2014-06-04 DIAGNOSIS — Z0000 Encounter for general adult medical examination without abnormal findings: Secondary | ICD-10-CM

## 2014-06-04 DIAGNOSIS — I10 Essential (primary) hypertension: Secondary | ICD-10-CM

## 2014-06-04 DIAGNOSIS — K219 Gastro-esophageal reflux disease without esophagitis: Secondary | ICD-10-CM

## 2014-06-04 DIAGNOSIS — M949 Disorder of cartilage, unspecified: Secondary | ICD-10-CM

## 2014-06-04 DIAGNOSIS — J3089 Other allergic rhinitis: Secondary | ICD-10-CM

## 2014-06-04 DIAGNOSIS — E876 Hypokalemia: Secondary | ICD-10-CM

## 2014-06-04 MED ORDER — PANTOPRAZOLE SODIUM 40 MG PO TBEC: 40.0000 mg | DELAYED_RELEASE_TABLET | Freq: Every day | ORAL | Status: DC

## 2014-06-04 MED ORDER — ZOSTER VACCINE LIVE 19400 UNT/0.65ML ~~LOC~~ SOLR: 0.6500 mL | Freq: Once | SUBCUTANEOUS | Status: DC

## 2014-06-04 NOTE — Progress Notes (Signed)
Patient ID: Wendy Figueroa, female   DOB: 1946/01/14, 68 y.o.   MRN: [id number]    Chief Complaint  Patient presents with  . Medical Management of Chronic Issues   Allergies  Allergen Reactions  . Demerol Hives    All over the body   . Lisinopril-Hydrochlorothiazide Hives    All over the body  . Penicillins Hives    All over the body   HPI 68 y/o female patient is seen today for follow up visit. She has history of asthma (never intubated or hospitalized), HTN, hyperlipidemia and allergic rhinitis. She sees Dr [name] for pulmonary issues and Dr Donneta Romberg for allergy issues. She continues to have the need to clear her throat and this bothers her. She has had laryngoscopy and was normal. She is on omeprazole without much help. Denies epigastric pain, unexplained weight loss or heartburn. Has ocassional sour brash. She denies any other concerns today She has mammogram every 2 years. Has not had a dexa scan recently. uptodate with influenza vaccine  Review of Systems  Constitutional: Negative for fever, chills, malaise/fatigue and diaphoresis.  HENT: Negative for congestion, hearing loss and sore throat. Has allergic rhinitis  Eyes: Negative for blurred vision, double vision and discharge. Wears corrective glasses. uptodate on eye exam Respiratory: Negative for sputum production, shortness of breath and wheezing.  has cough occassionally Cardiovascular: Negative for chest pain, palpitations, orthopnea and PND. Uses 2-3 pillows at baseline. has leg swelling and wears ted hose Gastrointestinal: Negative for heartburn, nausea, vomiting, abdominal pain, diarrhea. Has occassional constipation and taking fruits and stool softener helps. Denies melena or rectal bleed  Genitourinary: Negative for dysuria, frequency and flank pain. positive for urgency Musculoskeletal: Negative for back pain, falls, joint pain and myalgias.  Skin: Negative for itching and rash.  Neurological: Negative for dizziness,  tingling, numbness, focal weakness and headaches.  Psychiatric/Behavioral: Negative for depression and memory loss. The patient is not nervous/anxious.      Past Medical History  Diagnosis Date  . Allergic rhinitis   . Sinusitis   . Asthma   . Hypertension   . Venous insufficiency   . Hypercholesterolemia   . Overweight(278.02)   . GERD (gastroesophageal reflux disease)   . Diverticulosis of colon   . Hx of colonic polyps   . Elevated alkaline phosphatase level   . Sebaceous cyst     on back  . Wheezing     occasional  . Cough     occasional  . Leg swelling     occasional  . H/O urinary incontinence 11/06/2008  . Rectocele 11/06/2008    Large  . Cystocele 11/05/08  . Hard measles   . H/O varicella   . Yeast infection   . Abnormal Pap smear     Over 1yrs ago  . Anemia   . DJD (degenerative joint disease)     no per pt   Past Surgical History  Procedure Laterality Date  . Apog[place] / perigee repair  10/2008    Dr. Octavio Manns  . Breast surgery  1978    cyst removed from both removed  . Abdominal hysterectomy  1988  . Bladder tack  2011  . Anterior and posterior repair  11/05/08   Current Outpatient Prescriptions on File Prior to Visit  Medication Sig Dispense Refill  . albuterol (PROVENTIL HFA;VENTOLIN HFA) 108 (90 BASE) MCG/ACT inhaler Inhale 2 puffs into the lungs every 6 (six) hours as needed.        Marland Kitchen  budesonide-formoterol (SYMBICORT) 160-4.5 MCG/ACT inhaler Inhale 2 puffs into the lungs 2 (two) times daily.        . Calcium Carb-Cholecalciferol (CALCIUM 600 + D PO) Take by mouth. Take one tablet daily      . chlorpheniramine-HYDROcodone (TUSSIONEX) 10-8 MG/5ML LQCR Take 5 mLs by mouth every 12 (twelve) hours as needed.  180 mL  0  . ciclesonide (OMNARIS) 50 MCG/ACT nasal spray Place 2 sprays into both nostrils daily.        . furosemide (LASIX) 40 MG tablet Take 1 tablet (40 mg total) by mouth daily.  90 tablet  3  . hydrOXYzine (ATARAX/VISTARIL) 25 MG tablet TAKE 1  TABLET (25 MG TOTAL) BY MOUTH EVERY 6 (SIX) HOURS AS NEEDED FOR ITCHING.  50 tablet  1  . levocetirizine (XYZAL) 5 MG tablet Take 5 mg by mouth every evening.        Marland Kitchen losartan (COZAAR) 50 MG tablet Take 1 tablet (50 mg total) by mouth daily.  90 tablet  3  . montelukast (SINGULAIR) 10 MG tablet Take 10 mg by mouth at bedtime.        . Multiple Vitamins-Minerals (MACULAR VITAMIN BENEFIT PO) Take 1 capsule by mouth 2 (two) times daily.        Marland Kitchen NIFEdipine (PROCARDIA XL/ADALAT-CC) 60 MG 24 hr tablet Take 1 tablet (60 mg total) by mouth daily.  90 tablet  3  . NON FORMULARY Allergy shots once weekly      . Omega-3 Fatty Acids (FISH OIL PO) Take by mouth daily.      . potassium chloride SA (K-DUR,KLOR-CON) 20 MEQ tablet Take 2 tablets by mouth daily  180 tablet  0  . pravastatin (PRAVACHOL) 40 MG tablet Take 1 tablet (40 mg total) by mouth daily.  90 tablet  3   No current facility-administered medications on file prior to visit.    Family History  Problem Relation Age of Onset  . Aneurysm Mother   . Lung cancer Father   . Colon cancer Neg Hx   . Esophageal cancer Neg Hx   . Stomach cancer Neg Hx   . Rectal cancer Neg Hx   . Hypertension Daughter   . Hypertension Daughter    History   Social History  . Marital Status: Married    Spouse Name: [name]    Number of Children: 2  . Years of Education: N/A   Occupational History  . LPN     Social History Main Topics  . Smoking status: Never Smoker   . Smokeless tobacco: Never Used  . Alcohol Use: Yes     Comment: social use  . Drug Use: No  . Sexual Activity: Not on file   Other Topics Concern  . Not on file   Social History Narrative   Married   Never smoked   Alcohol few beers on week-end   Exercise walk 5 days a week for one hour   No Advance Directive    Physical exam BP 142/80  Pulse 94  Temp(Src) 98.2 F (36.8 C) (Oral)  Resp 20  Ht 5\' 3"  (1.6 m)  Wt 225 lb 3.2 oz (102.15 kg)  BMI 39.90 kg/m2  SpO2  99%  General- elderly female in no acute distress, overweight Head- atraumatic, normocephalic Eyes- PERRLA, EOMI, no pallor, no icterus, no discharge Neck- no lymphadenopathy, no thyromegaly, no jugular vein distension, no carotid bruit Nose- normal nasaal mucosa, no maxillary sinus tenderness, no frontal sinus tenderness Mouth- normal  mucus membrane, no oral thrush, normal oropharynx Cardiovascular- normal s1,s2, no murmurs/ rubs/ gallops, normal distal pulses, 1+ edema in both legs Respiratory- bilateral clear to auscultation, no wheeze, no rhonchi, no crackles Abdomen- bowel sounds present, soft, non tender Musculoskeletal- able to move all 4 extremities, no spinal and paraspinal tenderness, steady gait, no use of assistive device, normal range of motion Neurological- no focal deficit, normal pinprick sensation and vibration sense, normal reflexes Skin- warm and dry Psychiatry- alert and oriented to person, place and time, normal mood and affect  Assessment/plan  1. Essential hypertension Stable, continue cozaar, nifedipine and lasix, monitor bp, dietary and exercise counselling provided  2. Other allergic rhinitis Stable currently, continue current regime  3. Asthma, mild intermittent, uncomplicated No recent exacerbation. Continue her rescue inhaler and monitor clinically  4. Gastroesophageal reflux disease, esophagitis presence not specified Will d/c omeprazole and have her on pantoprazole for now. If no improvement of her cough and sour brash will get gi referral for possible EGD to hep rule out reflux/ hernia and gastritis  5. Hyperlipidemia LDL goal <130 Continue pravachol  6. Disorder of bone and cartilage dexa scan, cotninue ca-vit d, d/c vit d 1000 u supplement with high vit d level on labs - DG Bone Density; Future  7. Hypokalemia Continue kcl supplement  8. Annual physical exam zostavax script provided Discontinued iron  Pt gets mammogram every 2 years Labs  reviewed Dietary and exercise counselling provided Does not want any further pelvic exam uptodate with colonoscopy Advance directive reading material provided  9. Iron def anemia Normal iron panel and cbc, d/c iron supplement for now

## 2014-06-16 ENCOUNTER — Telehealth: Payer: Self-pay | Admitting: Pulmonary Disease

## 2014-06-16 MED ORDER — HYDROCOD POLST-CHLORPHEN POLST 10-8 MG/5ML PO LQCR: 5.0000 mL | Freq: Two times a day (BID) | ORAL | Status: DC | PRN

## 2014-06-16 NOTE — Telephone Encounter (Signed)
Please advise Dr [name] Pt requests refill of Tussionex Cough Syrup. Last filled 02/27/14 127mL Last seen 02/05/14 by SN Thanks!

## 2014-06-16 NOTE — Telephone Encounter (Signed)
Per SN: okay to refill Tussionex  Called spoke with pt's souse.  Advised SN okayed the refill on the Tussionex but advised the rx will either need to be picked up or mailed.  Spouse unsure of the location of the office but will inform spouse that rx is ready so that she can pick this up tomorrow.  Advised spouse rx will be placed up front for pt to pick up at her convenience.  Rx printed, signed by SN and placed in the brown folder up front.    Nothing further needed; will sign off.

## 2014-06-23 ENCOUNTER — Encounter: Payer: Self-pay | Admitting: Internal Medicine

## 2014-06-27 LAB — HM DEXA SCAN

## 2014-07-01 ENCOUNTER — Other Ambulatory Visit: Payer: Self-pay | Admitting: *Deleted

## 2014-07-01 MED ORDER — POTASSIUM CHLORIDE CRYS ER 20 MEQ PO TBCR: EXTENDED_RELEASE_TABLET | ORAL | Status: DC

## 2014-07-01 NOTE — Telephone Encounter (Signed)
[place]

## 2014-07-04 ENCOUNTER — Other Ambulatory Visit: Payer: Self-pay | Admitting: *Deleted

## 2014-07-08 ENCOUNTER — Encounter: Payer: Self-pay | Admitting: Internal Medicine

## 2014-07-08 DIAGNOSIS — M8580 Other specified disorders of bone density and structure, unspecified site: Secondary | ICD-10-CM | POA: Insufficient documentation

## 2014-07-09 ENCOUNTER — Encounter: Payer: Self-pay | Admitting: *Deleted

## 2014-07-09 ENCOUNTER — Telehealth: Payer: Self-pay | Admitting: *Deleted

## 2014-07-09 NOTE — Telephone Encounter (Signed)
Received patient's Bone Density Report and per Dr. Bubba Camp patient to start Calcium with Vitamin D 600-400 by mouth twice daily and recheck Dexa in 2 years. Patient Notified and agreed.

## 2014-08-11 ENCOUNTER — Telehealth: Payer: Self-pay | Admitting: Pulmonary Disease

## 2014-08-11 NOTE — Telephone Encounter (Signed)
Pt last seen 01/08/2014 Requesting refill of tussionex.   Last filled by SN on 06/16/2014 SN please advise if ok to give refill.  Thanks No pending appts with SN

## 2014-08-12 MED ORDER — HYDROCOD POLST-CHLORPHEN POLST 10-8 MG/5ML PO LQCR: 5.0000 mL | Freq: Two times a day (BID) | ORAL | Status: DC | PRN

## 2014-08-12 NOTE — Telephone Encounter (Signed)
Per SN---  Ok to refill tussionex.   This has been printed out and placed up front for pt to pick up tomorrow.  Pt is aware and nothing further is needed.

## 2014-08-13 ENCOUNTER — Ambulatory Visit (INDEPENDENT_AMBULATORY_CARE_PROVIDER_SITE_OTHER): Payer: Medicare Other | Admitting: Podiatry

## 2014-08-13 ENCOUNTER — Encounter: Payer: Self-pay | Admitting: Podiatry

## 2014-08-13 VITALS — BP 142/78 | HR 69 | Resp 12

## 2014-08-13 DIAGNOSIS — M79676 Pain in unspecified toe(s): Secondary | ICD-10-CM

## 2014-08-13 DIAGNOSIS — B351 Tinea unguium: Secondary | ICD-10-CM

## 2014-08-13 NOTE — Progress Notes (Signed)
Patient ID: Wendy Figueroa, female   DOB: [birth year], 68 y.o.   MRN: [id number]  Subjective: This patient presents for ongoing debridement of painful toenails when walking wearing shoes  Objective: The toenails are elongated, discolored, hypertrophic and tender to palpation 6-10  Assessment: Symptomatic onychomycoses 6-10  Plan: Debrided toenails 10 without a bleeding  Reappoint 3 months

## 2014-09-15 ENCOUNTER — Other Ambulatory Visit: Payer: Self-pay | Admitting: *Deleted

## 2014-09-15 MED ORDER — PANTOPRAZOLE SODIUM 40 MG PO TBEC: DELAYED_RELEASE_TABLET | ORAL | Status: DC

## 2014-09-15 NOTE — Telephone Encounter (Signed)
Patient called and requested to be faxed to pharmacy 

## 2014-10-07 ENCOUNTER — Other Ambulatory Visit: Payer: Self-pay | Admitting: *Deleted

## 2014-10-07 MED ORDER — POTASSIUM CHLORIDE CRYS ER 20 MEQ PO TBCR: EXTENDED_RELEASE_TABLET | ORAL | Status: DC

## 2014-10-07 NOTE — Telephone Encounter (Signed)
Patient requested to be faxed to pharmacy. Faxed. 

## 2014-10-08 ENCOUNTER — Ambulatory Visit: Payer: Medicare Other | Admitting: Internal Medicine

## 2014-10-13 ENCOUNTER — Telehealth: Payer: Self-pay | Admitting: Pulmonary Disease

## 2014-10-13 MED ORDER — HYDROCOD POLST-CHLORPHEN POLST 10-8 MG/5ML PO LQCR: 5.0000 mL | Freq: Two times a day (BID) | ORAL | Status: DC | PRN

## 2014-10-13 NOTE — Telephone Encounter (Signed)
Per SN---  Ok to refill the tussionex.  thanks 

## 2014-10-13 NOTE — Telephone Encounter (Signed)
rx printed, signed by SN, and placed up front for pickup.  Pt aware.  Nothing further needed.

## 2014-10-13 NOTE — Telephone Encounter (Signed)
Spoke with pt, states she needs a refill on tussionex.  She will pick up this rx.  Last filled on 08/12/14.  SN are you ok with this refill?  Thanks!

## 2014-10-14 ENCOUNTER — Encounter: Payer: Self-pay | Admitting: Internal Medicine

## 2014-10-14 ENCOUNTER — Ambulatory Visit (INDEPENDENT_AMBULATORY_CARE_PROVIDER_SITE_OTHER): Payer: PPO | Admitting: Internal Medicine

## 2014-10-14 VITALS — BP 148/80 | HR 94 | Temp 97.9°F | Resp 20 | Ht 63.0 in | Wt 223.4 lb

## 2014-10-14 DIAGNOSIS — R748 Abnormal levels of other serum enzymes: Secondary | ICD-10-CM | POA: Diagnosis not present

## 2014-10-14 DIAGNOSIS — I10 Essential (primary) hypertension: Secondary | ICD-10-CM | POA: Diagnosis not present

## 2014-10-14 DIAGNOSIS — K219 Gastro-esophageal reflux disease without esophagitis: Secondary | ICD-10-CM

## 2014-10-14 DIAGNOSIS — Z23 Encounter for immunization: Secondary | ICD-10-CM | POA: Diagnosis not present

## 2014-10-14 DIAGNOSIS — J4520 Mild intermittent asthma, uncomplicated: Secondary | ICD-10-CM

## 2014-10-14 MED ORDER — LOSARTAN POTASSIUM 50 MG PO TABS: 75.0000 mg | ORAL_TABLET | Freq: Every day | ORAL | Status: DC

## 2014-10-14 MED ORDER — ZOSTER VACCINE LIVE 19400 UNT/0.65ML ~~LOC~~ SOLR: 0.6500 mL | Freq: Once | SUBCUTANEOUS | Status: DC

## 2014-10-14 NOTE — Progress Notes (Signed)
Patient ID: Wendy Figueroa, female   DOB: [birth year], 69 y.o.   MRN: [id number]    Chief Complaint  Patient presents with  . Medical Management of Chronic Issues   Allergies  Allergen Reactions  . Demerol Hives    All over the body   . Lisinopril-Hydrochlorothiazide Hives    All over the body  . Penicillins Hives    All over the body   HPI 69 y/o female patient is seen today for follow up visit.  She continues to have the need to clear her throat and this bothers her. She has had laryngoscopy and was normal. She has been seen by ENT and pulmonary for this. She is on pantoprazole (introduced last visit) without much help. Denies epigastric pain, unexplained weight loss or heartburn. Her home bp reading has been elevated with SBP 140-160 and has been taking her current regimen of losartan.   Review of Systems   Constitutional: Negative for fever, chills, malaise/fatigue and diaphoresis.   HENT: Negative for congestion, hearing loss and sore throat. Has allergic rhinitis   Eyes: Negative for blurred vision, double vision and discharge. Wears corrective glasses. uptodate on eye exam Respiratory: Negative for sputum production, shortness of breath and wheezing.  has cough occassionally Cardiovascular: Negative for chest pain, palpitations, orthopnea and PND. Uses 2-3 pillows at baseline. has leg swelling and wears ted hose Gastrointestinal: Negative for heartburn, nausea, vomiting, abdominal pain, diarrhea. Has occassional constipation and taking fruits and stool softener helps. Denies melena or rectal bleed   Genitourinary: Negative for dysuria, frequency and flank pain. positive for urgency but no incontinence Musculoskeletal: Negative for back pain, falls, joint pain and myalgias.   Skin: Negative for itching and rash.   Neurological: Negative for dizziness, tingling, numbness, focal weakness and headaches.   Psychiatric/Behavioral: Negative for depression and memory loss. The patient  is not nervous/anxious.    Past Medical History  Diagnosis Date  . Allergic rhinitis   . Sinusitis   . Asthma   . Hypertension   . Venous insufficiency   . Hypercholesterolemia   . Overweight(278.02)   . GERD (gastroesophageal reflux disease)   . Diverticulosis of colon   . Hx of colonic polyps   . Elevated alkaline phosphatase level   . Sebaceous cyst     on back  . Wheezing     occasional  . Cough     occasional  . Leg swelling     occasional  . H/O urinary incontinence 11/06/2008  . Rectocele 11/06/2008    Large  . Cystocele 11/05/08  . Hard measles   . H/O varicella   . Yeast infection   . Abnormal Pap smear     Over 99yrs ago  . Anemia   . DJD (degenerative joint disease)     no per pt   Current Outpatient Prescriptions on File Prior to Visit  Medication Sig Dispense Refill  . albuterol (PROVENTIL HFA;VENTOLIN HFA) 108 (90 BASE) MCG/ACT inhaler Inhale 2 puffs into the lungs every 6 (six) hours as needed.      . budesonide-formoterol (SYMBICORT) 160-4.5 MCG/ACT inhaler Inhale 2 puffs into the lungs 2 (two) times daily.      . Calcium Carb-Cholecalciferol (CALCIUM 600 + D PO) Take by mouth. Take one tablet daily    . chlorpheniramine-HYDROcodone (TUSSIONEX) 10-8 MG/5ML LQCR Take 5 mLs by mouth every 12 (twelve) hours as needed. 180 mL 0  . ciclesonide (OMNARIS) 50 MCG/ACT nasal spray Place 2 sprays into both  nostrils daily.      . furosemide (LASIX) 40 MG tablet Take 1 tablet (40 mg total) by mouth daily. 90 tablet 3  . hydrOXYzine (ATARAX/VISTARIL) 25 MG tablet TAKE 1 TABLET (25 MG TOTAL) BY MOUTH EVERY 6 (SIX) HOURS AS NEEDED FOR ITCHING. 50 tablet 1  . levocetirizine (XYZAL) 5 MG tablet Take 5 mg by mouth every evening.      . montelukast (SINGULAIR) 10 MG tablet Take 10 mg by mouth at bedtime.      . Multiple Vitamins-Minerals (MACULAR VITAMIN BENEFIT PO) Take 1 capsule by mouth 2 (two) times daily.      Marland Kitchen NIFEdipine (PROCARDIA XL/ADALAT-CC) 60 MG 24 hr tablet Take  1 tablet (60 mg total) by mouth daily. 90 tablet 3  . NON FORMULARY Allergy shots once weekly    . Omega-3 Fatty Acids (FISH OIL PO) Take by mouth daily.    . pantoprazole (PROTONIX) 40 MG tablet Take one tablet by mouth once daily for stomach 30 tablet 3  . pravastatin (PRAVACHOL) 40 MG tablet Take 1 tablet (40 mg total) by mouth daily. 90 tablet 3  . potassium chloride SA (K-DUR,KLOR-CON) 20 MEQ tablet Take 2 tablets by mouth daily for potassium supplement 180 tablet 0   No current facility-administered medications on file prior to visit.    Physical exam BP 148/80 mmHg  Pulse 94  Temp(Src) 97.9 F (36.6 C) (Oral)  Resp 20  Ht 5\' 3"  (1.6 m)  Wt 223 lb 6.4 oz (101.334 kg)  BMI 39.58 kg/m2  SpO2 97%  Wt Readings from Last 3 Encounters:  10/14/14 223 lb 6.4 oz (101.334 kg)  06/04/14 225 lb 3.2 oz (102.15 kg)  02/19/14 218 lb (98.884 kg)    General- elderly female in no acute distress, overweight Head- atraumatic, normocephalic Eyes- PERRLA, EOMI, no pallor, no icterus, no discharge Neck- no lymphadenopathy Mouth- normal mucus membrane, no oral thrush, normal oropharynx Cardiovascular- normal s1,s2, no murmurs, normal distal pulses, 1+ edema in both legs Respiratory- bilateral clear to auscultation, no wheeze, no rhonchi, no crackles Abdomen- bowel sounds present, soft, non tender Musculoskeletal- able to move all 4 extremities Neurological- no focal deficit Skin- warm and dry Psychiatry- alert and oriented to person, place and time, normal mood and affect  labs  CMP Latest Ref Rng 05/26/2014 09/26/2013 08/28/2012  Glucose 65 - 99 mg/dL 98 94 105(H)  BUN 8 - 27 mg/dL 13 11 13   Creatinine 0.57 - 1.00 mg/dL 0.78 0.7 0.7  Sodium 134 - 144 mmol/L 142 142 140  Potassium 3.5 - 5.2 mmol/L 3.9 3.9 4.2  Chloride 97 - 108 mmol/L 102 110 106  CO2 18 - 29 mmol/L 23 26 28   Calcium 8.7 - 10.3 mg/dL 9.5 9.7 9.6  Total Protein 6.0 - 8.5 g/dL 6.9 7.7 7.8  Albumin 3.5 - 4.8 g/dL 4.4 - -    Total Bilirubin 0.0 - 1.2 mg/dL 0.4 0.6 1.0  Alkaline Phos 39 - 117 IU/L 168(H) 145(H) 146(H)  AST 0 - 40 IU/L 27 25 30   ALT 0 - 32 IU/L 21 21 22    CBC Latest Ref Rng 05/26/2014 09/26/2013 08/28/2012  WBC 3.4 - 10.8 x10E3/uL 5.6 5.3 6.0  Hemoglobin 11.1 - 15.9 g/dL 12.1 12.6 12.6  Hematocrit 34.0 - 46.6 % 36.8 39.8 38.1  Platelets 150.0 - 400.0 K/uL - 280.0 273.0   Lipid Panel     Component Value Date/Time   CHOL 151 09/26/2013 0849   TRIG 51 05/26/2014 0836  HDL 71 05/26/2014 0836   HDL 54.60 09/26/2013 0849   CHOLHDL 2.4 05/26/2014 0836   CHOLHDL 3 09/26/2013 0849   VLDL 14.2 09/26/2013 0849   LDLCALC 91 05/26/2014 0836   LDLCALC 82 09/26/2013 0849   LDLDIRECT 141.4 03/21/2008 0919   Lab Results  Component Value Date   HGBA1C 5.6 05/26/2014    Assessment/plan  1. Alkaline phosphatase elevation Unclear etiology, denies RUQ discomfort/pain. Appetite is good. Recheck lft and if ALP remains elevated, consider further workup to assess for gall bladder and bone etiology - CMP  2. Essential hypertension Elevated. Increase cozaar to 75 mg daily, monitor bp at home and review to assess for need to adjust medication. Continue nifedipine and lasix. Warning signs with elevated bp explained.  3. Asthma, mild intermittent, uncomplicated No recent exacerbation. Continue her rescue inhaler, singulair and PPU and monitor clinically.   4. Gastroesophageal reflux disease, esophagitis presence not specified Continue pantoprazole for now. Will get gi referral for possible EGD to hep rule out reflux/ hernia and gastritis

## 2014-10-20 ENCOUNTER — Telehealth: Payer: Self-pay | Admitting: *Deleted

## 2014-10-20 ENCOUNTER — Other Ambulatory Visit: Payer: PPO

## 2014-10-20 MED ORDER — LOSARTAN POTASSIUM 25 MG PO TABS: ORAL_TABLET | ORAL | Status: DC

## 2014-10-20 MED ORDER — LOSARTAN POTASSIUM 50 MG PO TABS: ORAL_TABLET | ORAL | Status: DC

## 2014-10-20 NOTE — Telephone Encounter (Signed)
Patient Notified and agreed. Faxed Rx into pharmacy.

## 2014-10-20 NOTE — Telephone Encounter (Signed)
Patient needs 75 mg total of losartan with high blood pressure. Can they do 30 of 50 mg tablet and 30 of 25 mg tablet. Then she can take 1 50 mg tab with 1 25 mg tab for total of 75 mg daily. Can you provide 2 script of losartan of these 2 strength. thanks

## 2014-10-20 NOTE — Telephone Encounter (Signed)
Insurance [employer] [phone] sent a letter to patient stating that they cannot provide the full amount of Losartan due to plan quantity limits. They will not supply Losartan 50mg  One and half daily. They will only supply 30 tablets for 30 days at a time. Please Advise.

## 2014-10-21 ENCOUNTER — Telehealth: Payer: Self-pay

## 2014-10-21 LAB — COMPREHENSIVE METABOLIC PANEL
ALBUMIN: 4.2 g/dL (ref 3.5–4.8)
ALT: 18 IU/L (ref 0–32)
AST: 27 IU/L (ref 0–40)
Albumin/Globulin Ratio: 1.4 (ref 1.1–2.5)
Alkaline Phosphatase: 162 IU/L — ABNORMAL HIGH (ref 39–117)
BUN / CREAT RATIO: 14 (ref 11–26)
BUN: 12 mg/dL (ref 8–27)
Bilirubin Total: 0.4 mg/dL (ref 0.0–1.2)
CHLORIDE: 104 mmol/L (ref 97–108)
CO2: 24 mmol/L (ref 18–29)
CREATININE: 0.85 mg/dL (ref 0.57–1.00)
Calcium: 9.7 mg/dL (ref 8.7–10.3)
GFR calc Af Amer: 80 mL/min/{1.73_m2} (ref >59)
GFR, EST NON AFRICAN AMERICAN: 70 mL/min/{1.73_m2} (ref >59)
GLUCOSE: 82 mg/dL (ref 65–99)
Globulin, Total: 3 g/dL (ref 1.5–4.5)
Potassium: 4.5 mmol/L (ref 3.5–5.2)
SODIUM: 141 mmol/L (ref 134–144)
Total Protein: 7.2 g/dL (ref 6.0–8.5)

## 2014-10-21 NOTE — Telephone Encounter (Signed)
Per Dr.Pandey  Called patient, spoke with husband Jaycie Kregel). I informed [name] that we placed referral for GI for acid reflux. [name] will share this information with his wife.

## 2014-10-22 ENCOUNTER — Encounter: Payer: Self-pay | Admitting: Internal Medicine

## 2014-10-26 LAB — SPECIMEN STATUS REPORT

## 2014-10-26 LAB — GAMMA GT: GGT: 23 IU/L (ref 0–60)

## 2014-11-12 ENCOUNTER — Ambulatory Visit: Payer: Medicare Other | Admitting: Podiatry

## 2014-12-03 ENCOUNTER — Ambulatory Visit (INDEPENDENT_AMBULATORY_CARE_PROVIDER_SITE_OTHER): Payer: PPO | Admitting: Podiatry

## 2014-12-03 ENCOUNTER — Encounter: Payer: Self-pay | Admitting: Podiatry

## 2014-12-03 DIAGNOSIS — B351 Tinea unguium: Secondary | ICD-10-CM | POA: Diagnosis not present

## 2014-12-03 DIAGNOSIS — M79676 Pain in unspecified toe(s): Secondary | ICD-10-CM | POA: Diagnosis not present

## 2014-12-04 NOTE — Progress Notes (Signed)
Patient ID: Wendy Figueroa, female   DOB: [birth year], 69 y.o.   MRN: [id number]  Subjective: Patient presents today complaining of painful toenails when walking wearing shoes and requests toenail debridement  Objective: The toenails are elongated, hypertrophic, discolored, and tender to direct palpation  Assessment: Symptomatic onychomycoses 6-10  Plan: Debridement toenails 10 without any  Bleeding  Reappoint 3 months

## 2014-12-10 ENCOUNTER — Other Ambulatory Visit: Payer: Self-pay | Admitting: *Deleted

## 2014-12-10 ENCOUNTER — Other Ambulatory Visit: Payer: PPO

## 2014-12-10 DIAGNOSIS — R748 Abnormal levels of other serum enzymes: Secondary | ICD-10-CM

## 2014-12-11 LAB — COMPREHENSIVE METABOLIC PANEL
ALBUMIN: 4.2 g/dL (ref 3.5–4.8)
ALT: 17 IU/L (ref 0–32)
AST: 22 IU/L (ref 0–40)
Albumin/Globulin Ratio: 1.4 (ref 1.1–2.5)
Alkaline Phosphatase: 157 IU/L — ABNORMAL HIGH (ref 39–117)
BILIRUBIN TOTAL: 0.4 mg/dL (ref 0.0–1.2)
BUN / CREAT RATIO: 16 (ref 11–26)
BUN: 12 mg/dL (ref 8–27)
CO2: 22 mmol/L (ref 18–29)
Calcium: 9.6 mg/dL (ref 8.7–10.3)
Chloride: 105 mmol/L (ref 97–108)
Creatinine, Ser: 0.75 mg/dL (ref 0.57–1.00)
GFR calc Af Amer: 93 mL/min/{1.73_m2} (ref >59)
GFR calc non Af Amer: 80 mL/min/{1.73_m2} (ref >59)
Globulin, Total: 2.9 g/dL (ref 1.5–4.5)
Glucose: 92 mg/dL (ref 65–99)
Potassium: 4 mmol/L (ref 3.5–5.2)
SODIUM: 143 mmol/L (ref 134–144)
Total Protein: 7.1 g/dL (ref 6.0–8.5)

## 2014-12-11 LAB — HEPATIC FUNCTION PANEL: BILIRUBIN, DIRECT: 0.14 mg/dL (ref 0.00–0.40)

## 2014-12-15 ENCOUNTER — Telehealth: Payer: Self-pay | Admitting: Pulmonary Disease

## 2014-12-15 NOTE — Telephone Encounter (Signed)
Per SN, OV needs to be made for refill will be given on cough medicine.

## 2014-12-15 NOTE — Telephone Encounter (Signed)
Pt aware of rec's per SN- scheduled for OV with SN 12/16/14 at 12pm  Nothing further needed.

## 2014-12-15 NOTE — Telephone Encounter (Signed)
Last OV 09/2013 No pending OV Last refill 10/17/14 by SN  SN - please advise on refill. Thanks.

## 2014-12-16 ENCOUNTER — Encounter: Payer: Self-pay | Admitting: Pulmonary Disease

## 2014-12-16 ENCOUNTER — Telehealth: Payer: Self-pay | Admitting: Pulmonary Disease

## 2014-12-16 ENCOUNTER — Ambulatory Visit (INDEPENDENT_AMBULATORY_CARE_PROVIDER_SITE_OTHER): Payer: PPO | Admitting: Pulmonary Disease

## 2014-12-16 VITALS — BP 130/78 | HR 102 | Temp 98.3°F | Wt 216.0 lb

## 2014-12-16 DIAGNOSIS — E663 Overweight: Secondary | ICD-10-CM

## 2014-12-16 DIAGNOSIS — J301 Allergic rhinitis due to pollen: Secondary | ICD-10-CM

## 2014-12-16 DIAGNOSIS — J4520 Mild intermittent asthma, uncomplicated: Secondary | ICD-10-CM

## 2014-12-16 DIAGNOSIS — K219 Gastro-esophageal reflux disease without esophagitis: Secondary | ICD-10-CM | POA: Diagnosis not present

## 2014-12-16 MED ORDER — HYDROCODONE-CHLORPHENIRAMINE 5-4 MG/5ML PO SOLN: 5.0000 mL | Freq: Four times a day (QID) | ORAL | Status: DC | PRN

## 2014-12-16 NOTE — Progress Notes (Addendum)
Subjective:     Patient ID: Wendy Figueroa, female   DOB: Mar 21, 1946, 69 y.o.   MRN: [id number]  HPI 69 y/o BF here for a yearly follow up and med refills... he has multiple medical problems as noted below... She is an LPN... ~  SEE PREV EPIC NOTES FOR OLDER DATA >>   ~  August 28, 2012:  50moROV & Adam reports a good interval w/o new complaints or concerns...     AR> on Xyzal5, Singulair10, Omnaris, & allergy shots from [facility] 8/12 & his note is reviewed...    Asthma> on Symbicort160, NEBS w/ Albut, & Tussionex; she denies recent asthma exac, breathing well w/o wheezing, cough, SOB, etc...    HBP> on ProcXL60, Losar50, Lasix40, K20; BP= 140/70 & she denies CP, palpit, dizzy, ch in SOB/DOE, edema...    Chol> on Prav40 + FishOil; FLP shows TChol 162, TG 35, HDL 59, LDL 96    Overweight> wt down 5# to 215# today; we reviewed diet, exercise, wt reduction strategies...    GI> GERD, Divertics, Polyps, Elev AlkPhos> on Zegerid & followed by DrDBrodie; last colonoscopy was 7/06 & follow up planned 19yr    DJD> on Mobic15 as needed; she has been seen by DrGioffre in the past w/ shot in the left knee which helped... 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    Asthma> on Symbicort160, NEBS w/ Albut, & Tussionex; she denies recent asthma exac, breathing well w/o wheezing, cough, SOB, etc; requests refill Tussionex...    HBP> on ProcXL60, Losar50, Lasix40, K20; BP= 124/84 & she denies CP, palpit, dizzy, ch in SOB/DOE, edema...    Chol> on Prav40 +  FishOil; FLP 2/15 shows TChol 151, TG 71, HDL 55, LDL 82    Overweight> wt up 2# to 217# today (BMI=37); we reviewed diet, exercise, wt reduction strategies...    GI> GERD, Divertics, Polyps, Elev AlkPhos> on Zegerid & followed by DrDBrodie; last colonoscopy was 7/06 & follow up planned 10y7yr  DJD> on Mobic15 as needed; she has been seen by DrGioffre in the past w/ shot in the left knee which helped; now c/o right knee pain, we will Rx MOBIC15 7 refer to [facility] for XRays & eval... We reviewed prob list, meds, xrays and labs> see below for updates >> She had the 2014 flu vaccine in Oct2014... Requests refill tussionex...  LABS 2/15:  FLP- at goals on Prav40;  Chems- wnl x sl elev AlpPhos=145;  CBC- ok w/ Hg=12.6, MCV=83;  TSH=1.41...   ~  Jan 08, 2014:  3-13mo 74moand LenorDaleah/o 13mo h30moery bad allergies" despite her meds: Xyzal5Vertell Novakrgy shots, Singulair10, Symbicort160, ProairHFA, and Tussionex;  She called DrSharma "but he would not refill my Tussionex and I need it" she says;  She notes cough, congestion, min sput, no hemoptysis, sl SOB/DOE w/o change, and denies CP/ palpit/ edema;  She doesn't like Pred rx...  Exam w/  sl decr BS bilat but no wheezing;  She denies reflux symptoms on her Zegerid rx;  CXR is clear, and PF is superphysiologic but the tracing is inaccurate;  We decided to treat w/ Depo80, Medrol dosepak, and continue current regimen as outlined...     We reviewed prob list, meds, xrays and labs> see below for updates >> She has arranged for Primary Care thru DrPandey at [facility]...   CXR 5/15 showed normal heart size, clear lungs, NAD...   PFT 5/15 showed FVC=2.94 (120%), FEV1=2.53 (135%), %1sec=86, mid-flows=219% predicted, but her tracing shows a hitch & the numbers are not believed to be accurate despite mult attempts...  ~  December 16, 2014:  54moROV & LJaritareports doing reasonably well- she still follows w/ DrSharma Q611mo he fills most of her  meds;  States her breathing is pretty good, notes some cough & occas "spasms", SOB, wheezing;  [facility] needs w/ problems as listed below...     AR> on XySummitSingulair10, Flonases, & allergy shots from Dr[place]seen Q6[place]71[place] we do not have recent notes to review...    Asthma> on Symbicort160, Spiriva daily, ProairHFA as needed, & Tussionex; she denies recent asthma exac, breathing well w/o wheezing, cough, SOB, etc; requests refill Tussionex... We reviewed prob list, meds, xrays and labs> see below for updates >>            Problem List:  ALLERGIC RHINITIS (ICD-477.9) - followed by DrSharma on shots weekly... also takes FLONASE 2sp in each nostril daily,  SINGULAIR 53m49m  XYZAL 5mg/42m. prev allergy tests + for trees, grass, ragweed, molds, dust, cat>dog, etc...  Hx of SINUSITIS (ICD-473.9) - no recent infections or antibiotics required.  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 she has min cough, small amt clear sputum and no recent wheezing, increased dyspnea, chest pain, etc... ~  CXR 11/10 showed norm heart size, ectatic/ calcif ao, clear lungs w/ min peribronch thickening, NAD...  ~  2/15: on Symbicort160, NEBS w/ Albut, & Tussionex; she denies recent asthma exac, breathing well w/o wheezing, cough, SOB, etc; requests refill Tussionex. ~  5/15: c/o 12mo h75moery bad allergies" despite her meds: Xyzal5, Omnaris, Allergy shots, Singulair10, Symbicort160, ProairHFA, and Tussionex; she notes cough, congestion, min sput, no hemoptysis, sl SOB/DOE w/o change, and denies CP/ palpit/ edema;  Exam w/ sl decr BS bilat but no wheezing;  She denies reflux symptoms on her Zegerid rx;  CXR is clear, and PF is superphysiologic but the tracing is inaccurate;  We decided to treat w/ Depo80,  Medrol dosepak, and continue current regimen as outlined...  ~  CXR 5/15 showed normal heart size, clear lungs, NAD...  ~  PFT 5/15 showed FVC=2.94 (120%), FEV1=2.53 (135%), %1sec=86, mid-flows=219% predicted, but her tracing shows a hitch & the numbers are not believed to be accurate despite mult attempts... ~  4/16: on Symbicort160, Spiriva daily, ProairHFA as needed, & Tussionex; she denies recent asthma exac, breathing well w/o wheezing, cough, SOB, etc; requests refill Tussionex.  HYPERTENSION (ICD-401.9) - on PROCARDIA XL 60mg/d88mOSARTAN50, LASIX 40mg/d,75ml 20mEq- 285m. ~  2/12:  add-on for elev BP at church exerc program> we added LOSARTAN 50mg/d. ~72m13:  BP= 136/80 and she denies HA, fatigue, visual  changes, CP, palipit, dizziness, syncope, dyspnea, edema, etc... ~  1/14:  BP= 142/70 & she remains essentially asymptomatic...  ~  2/15: on ProcXL60, Losar50, Lasix40, K20; BP= 124/84 & she denies CP, palpit, dizzy, ch in SOB/DOE, edema. ~  She is followed by the [facility] on [place], Losatan50+25, Lasix40... BP remains under good control.  VENOUS INSUFFICIENCY (ICD-459.81) - she follows low sodium diet, elevates legs, wears support hose, and takes the Lasix...  HYPERCHOLESTEROLEMIA, BORDERLINE (ICD-272.4) - on PRAVASTATIN 25m/d + low fat diet... ~  FGu-Win5/07 showed TChol 190, TG 68, HDL 56, LDL 120 ~  FLP 7/09 showed TChol 202, TG 53, HDL 52, LDL 141... rec> add Prav40. ~  FMoul[place]rie11/10 on Prav40 showed TChol 171, TG 70, HDL 57, LDL 100 ~  FLP 12/11 on Prav40 showed TChol 150, TG 36, HDL 56, LDL 87 ~  FLP 1/13 on Prav40 showed TChol 162, TG 38, HDL 63, LDL 91 ~  FLP 1/14 on Prav40 showed TChol 162, TG 35, HDL 59, LDL 96 ~  FLP 2/15 on Prav40 showed TChol 151, TG 71, HDL 55, LDL 82  ~  [facility]...  OVERWEIGHT (ICD-278.02) - discussed low carb/ low fat diet and increased exercise program... ~  weight 7/09 is down 5# to 203#... she knows that  she needs to do better... ~  weight 11/10 = 212# ~  weight 12/11 = 215# ~  weight 2/12 = 209# ~  Weight 1/13 = 220# & we reviewed low carb, low fat, wt reducing diet! ~  Weight 1/14 = 215#  ~  Weight 2/15 = 217# ~  Weight 4/16 = 216#  GERD (ICD-530.81) - on ZEGERID prn, & off prev reglan rx... EGD 9/02 showed sliding HH, mild gastritis, neg biopsy...  DIVERTICULOSIS OF COLON (ICD-562.10) + COLONIC POLYPS (ICD-211.3) >>  ~  Colonoscopy 7/06 by DrDBrodie showed extensive divertics, spasm, redund colon, hems, and sm polyp (no path avail)... ~  Colonoscopy 10/14 by DrDBrodie showed 2 polyps in desc colon (Path= tub adenoma) & f/u planned 55yr..   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 she never ret for the GGT. ~  labs 11/10 showed AlkPhos= 195 ( 39-117), GGT= 54 (7-51)... continue to follow. ~  labs 12/11 showed AlkPhos= 158 ~  Labs 1/13 showed AlkPhos= 152 ~  Labs 1/14 showed AlkPhos= 146 ~  Labs 2/15 showed AlkPhos= 145  DEGENERATIVE JOINT DISEASE (ICD-715.90) ~  11/10: c/o left knee pain & XRay shows joint effusion- trial MOBIC 1566m & refer to Ortho> seen by DrGioffre & given knee injection.  DERM >> she has mult seborrheic keratoses, cherry red spots, and a sebaceous cyst on her back==> referred to [facility]. ~  Onychomycosis w/ periodic nail debridement by [facility] -  GYN= DrARoberts... [facility] W[facility] w/ TScores -0.9 to +1.1; repeat 7/13 by Gyn was still wnl w/ lowest Tscore -0.9 in Spine... [facility])... s/p hyst, and A/P repair by DrRoberts 3/10... she had PNEUMOVAX in 2006 (age58) & repeated 12/11 (age64).   Past Surgical History  Procedure Laterality Date  . Apogee / perigee repair  10/2008    Dr. A. Octavio Manns Breast surgery  1978    cyst removed from both removed  . Abdominal hysterectomy  1988  . Bladder tack  2011  . Anterior and  posterior repair  11/05/08    Outpatient Encounter Prescriptions as of 12/16/2014  Medication Sig  . albuterol (PROVENTIL HFA;VENTOLIN HFA) 108 (90 BASE) MCG/ACT inhaler Inhale 2 puffs into the lungs every 6 (six) hours as needed.    . budesonide-formoterol (SYMBICORT) 160-4.5 MCG/ACT inhaler Inhale 2 puffs into the lungs 2 (two) times daily.    . Calcium Carb-Cholecalciferol (CALCIUM 600 + D PO) Take by mouth. Take one tablet daily  . chlorpheniramine-HYDROcodone (TUSSIONEX) 10-8 MG/5ML LQCR Take 5 mLs by mouth every 12 (twelve) hours as needed.  . ciclesonide (OMNARIS) 50 MCG/ACT nasal spray Place 2 sprays into both nostrils daily.    . furosemide (LASIX) 40 MG tablet Take 1 tablet (40 mg total) by mouth daily.  . hydrOXYzine (ATARAX/VISTARIL) 25 MG tablet TAKE 1 TABLET (25 MG TOTAL) BY MOUTH EVERY 6 (SIX) HOURS AS NEEDED FOR ITCHING.  . levocetirizine (XYZAL) 5 MG tablet Take 5 mg by mouth every evening.    . losartan (COZAAR) 25 MG tablet Take one tablet by mouth once daily for blood pressure  . losartan (COZAAR) 50 MG tablet Take one tablet by mouth once daily for blood pressure  . montelukast (SINGULAIR) 10 MG tablet Take 10 mg by mouth at bedtime.    . Multiple Vitamins-Minerals (MACULAR VITAMIN BENEFIT PO) Take 1 capsule by mouth 2 (two) times daily.    . NIFEdipine (PROCARDIA XL/ADALAT-CC) 60 MG 24 hr tablet Take 1 tablet (60 mg total) by mouth daily.  . NON FORMULARY Allergy shots once weekly  . Omega-3 Fatty Acids (FISH OIL PO) Take by mouth daily.  . pantoprazole (PROTONIX) 40 MG tablet Take one tablet by mouth once daily for stomach  . potassium chloride SA (K-DUR,KLOR-CON) 20 MEQ tablet Take 2 tablets by mouth daily for potassium supplement  . pravastatin (PRAVACHOL) 40 MG tablet Take 1 tablet (40 mg total) by mouth daily.  . zoster vaccine live, PF, (ZOSTAVAX) 19400 UNT/0.65ML injection Inject 19,400 Units into the skin once.  . fluticasone (FLONASE) 50 MCG/ACT nasal spray 2  sprays daily.  . SPIRIVA HANDIHALER 18 MCG inhalation capsule 18 mcg 2 (two) times daily.    Allergies  Allergen Reactions  . Demerol Hives    All over the body   . Lisinopril-Hydrochlorothiazide Hives    All over the body  . Penicillins Hives    All over the body    Current Medications, Allergies, Past Medical History, Past Surgical History, Family History, and Social History were reviewed in  Link electronic medical record.    Review of Systems         See HPI - all other systems neg except as noted... The patient complains of dyspnea on exertion.  The patient denies anorexia, fever, weight loss, weight gain, vision loss, decreased hearing, hoarseness, chest pain, syncope, peripheral edema, prolonged cough, headaches, hemoptysis, abdominal pain, melena, hematochezia, severe indigestion/heartburn, hematuria, incontinence, muscle weakness, suspicious skin lesions, transient blindness, difficulty walking, depression, unusual weight change, abnormal bleeding, enlarged lymph nodes, and angioedema.   Objective:   Physical Exam     WD, WN, 69 y/o BF in NAD... GENERAL:  Alert & oriented; pleasant & cooperative... HEENT:  [place]/AT, EOM-wnl, PERRLA, EACs-clear, TMs-wnl, NOSE-clear, THROAT-clear & wnl. NECK:  Supple w/ fairROM; no JVD; normal carotid impulses w/o bruits; no thyromegaly or nodules palpated; no lymphadenopathy. CHEST:  Clear to P & A; without wheezes/ rales/ or rhonchi heard... HEART:  Regular Rhythm; without murmurs/ rubs/ or gallops detected... ABDOMEN:  Soft & nontender; normal bowel sounds;   no organomegaly or masses palpated.... EXT: without deformities, mild arthritic changes & decr ROM left knee; no varicose veins/ +venous insuffic/ 2+edema. NEURO:  CN's intact; no focal neuro deficits... DERM:  No lesions noted; no rash etc...  RADIOLOGY DATA:  Reviewed in the EPIC EMR & discussed w/ the patient...  LABORATORY DATA:  Reviewed in the EPIC EMR & discussed w/  the patient...   Assessment:      AR>  On allergy shots & meds from DrSharma; continue same... Asthma>  On meds as listed, breathing is stable and rec to continue same... She requests refill TUSSIONEX.   HBP>  BP stable on meds; advised low sodium diet, take meds regularly, etc... VI/ Edema> needs to elim sodium, elev legs, wear support hose & take the Lasix...  Chol> off Prav40 + FishOil; [facility]....  Overweight> wt up to 216# today; we reviewed diet, exercise, wt reduction strategies...  GI> GERD, Divertics, Polyps, Elev AlkPhos> on Zegerid & followed by DrDBrodie; last colonoscopy was 10/14 w/ tub adenoma removed...  DJD> on Mobic15 as needed & she was referred to [facility] for further eval...     Plan:     Patient's Medications  New Prescriptions   HYDROCODONE-CHLORPHENIRAMINE 5-4 MG/5ML SOLN    Take 5 mLs by mouth 4 (four) times daily as needed.  Previous Medications   ALBUTEROL (PROVENTIL HFA;VENTOLIN HFA) 108 (90 BASE) MCG/ACT INHALER    Inhale 2 puffs into the lungs every 6 (six) hours as needed.     BUDESONIDE-FORMOTEROL (SYMBICORT) 160-4.5 MCG/ACT INHALER    Inhale 2 puffs into the lungs 2 (two) times daily.     CALCIUM CARB-CHOLECALCIFEROL (CALCIUM 600 + D PO)    Take by mouth. Take one tablet daily   CHLORPHENIRAMINE-HYDROCODONE (TUSSIONEX) 10-8 MG/5ML LQCR    Take 5 mLs by mouth every 12 (twelve) hours as needed.   CICLESONIDE (OMNARIS) 50 MCG/ACT NASAL SPRAY    Place 2 sprays into both nostrils daily.     FLUTICASONE (FLONASE) 50 MCG/ACT NASAL SPRAY    2 sprays daily.   FUROSEMIDE (LASIX) 40 MG TABLET    Take 1 tablet (40 mg total) by mouth daily.   HYDROXYZINE (ATARAX/VISTARIL) 25 MG TABLET    TAKE 1 TABLET (25 MG TOTAL) BY MOUTH EVERY 6 (SIX) HOURS AS NEEDED FOR ITCHING.   LEVOCETIRIZINE (XYZAL) 5 MG TABLET    Take 5 mg by mouth every evening.     LOSARTAN (COZAAR) 25 MG TABLET    Take one tablet by mouth once daily for blood  pressure   LOSARTAN (COZAAR) 50 MG TABLET    Take one tablet by mouth once daily for blood pressure   MONTELUKAST (SINGULAIR) 10 MG TABLET    Take 10 mg by mouth at bedtime.     MULTIPLE VITAMINS-MINERALS (MACULAR VITAMIN BENEFIT PO)    Take 1 capsule by mouth 2 (two) times daily.     NIFEDIPINE (PROCARDIA XL/ADALAT-CC) 60 MG 24 HR TABLET    Take 1 tablet (60 mg total) by mouth daily.   NON FORMULARY    Allergy shots once weekly   OMEGA-3 FATTY ACIDS (FISH OIL PO)    Take by mouth daily.   PANTOPRAZOLE (PROTONIX) 40 MG TABLET    Take one tablet by mouth once daily for stomach   POTASSIUM CHLORIDE SA (K-DUR,KLOR-CON) 20 MEQ TABLET    Take 2 tablets by mouth daily for potassium supplement   PRAVASTATIN (PRAVACHOL) 40   MG TABLET    Take 1 tablet (40 mg total) by mouth daily.   SPIRIVA HANDIHALER 18 MCG INHALATION CAPSULE    18 mcg 2 (two) times daily.   ZOSTER VACCINE LIVE, PF, (ZOSTAVAX) 19400 UNT/0.65ML INJECTION    Inject 19,400 Units into the skin once.  Modified Medications   No medications on file  Discontinued Medications   No medications on file      

## 2014-12-16 NOTE — Patient Instructions (Signed)
Today we updated your med list in our EPIC system...    Continue your current medications the same...  We refilled your Hasty per request...  Remember to use cough drops, throat lozenges, sugarless gum- as needed for dry mouth...  Call for any questions...  Let's plan a follow up visit in 74yr, sooner if needed for problems.Marland KitchenMarland Kitchen

## 2014-12-16 NOTE — Telephone Encounter (Signed)
Left message for pt to call back to discuss refill on pt cough medicine. Writer needs to speak to pt directly.

## 2014-12-18 ENCOUNTER — Encounter: Payer: Self-pay | Admitting: Internal Medicine

## 2014-12-22 ENCOUNTER — Other Ambulatory Visit: Payer: Self-pay | Admitting: *Deleted

## 2014-12-22 MED ORDER — NIFEDIPINE ER OSMOTIC RELEASE 60 MG PO TB24: 60.0000 mg | ORAL_TABLET | Freq: Every day | ORAL | Status: DC

## 2014-12-22 MED ORDER — FUROSEMIDE 40 MG PO TABS: 40.0000 mg | ORAL_TABLET | Freq: Every day | ORAL | Status: DC

## 2014-12-22 NOTE — Telephone Encounter (Signed)
Patient requested and faxed to pharmacy 

## 2014-12-30 ENCOUNTER — Encounter: Payer: Self-pay | Admitting: Internal Medicine

## 2014-12-30 ENCOUNTER — Ambulatory Visit (INDEPENDENT_AMBULATORY_CARE_PROVIDER_SITE_OTHER): Payer: PPO | Admitting: Internal Medicine

## 2014-12-30 ENCOUNTER — Other Ambulatory Visit: Payer: Self-pay | Admitting: Internal Medicine

## 2014-12-30 VITALS — BP 136/78 | HR 96 | Ht 62.5 in | Wt 218.4 lb

## 2014-12-30 DIAGNOSIS — K219 Gastro-esophageal reflux disease without esophagitis: Secondary | ICD-10-CM | POA: Diagnosis not present

## 2014-12-30 DIAGNOSIS — R059 Cough, unspecified: Secondary | ICD-10-CM

## 2014-12-30 DIAGNOSIS — R05 Cough: Secondary | ICD-10-CM | POA: Diagnosis not present

## 2014-12-30 NOTE — Progress Notes (Signed)
Wendy Figueroa 11/05/1945 [id number]  Note: This dictation was prepared with Dragon digital system. Any transcriptional errors that result from this procedure are unintentional.   History of Present Illness: This is a 69 year old white female with chronic cough of several years duration which occurs at night as well as during the day. She had a full evaluation by Dr. [name] and she has been seen by ENT specialist Dr.Kozlow. She has been treated for gastroesophageal reflux. I have seen her in 2002 for evaluation of ongoing cough and she was found to have 2-3 cm hiatal hernia and  H. pylori gastritis. We have ordered 24-hour pH probe but I don't see the results and I think  it was not done. She has also seen allergist Dr Donneta Romberg. Patient denies dysphagia, odynophagia. There are no specific triggering factors. She has been on anti-cough medications which help some but she doesn't like to take them. Patient does not smoke , she sleeps in a recliner. She is up-to-date on colonoscopy. In 2003 she was found to have redundant colon. In July 2006 1 sessile polyp removed no pathology report found. Last colonoscopy October 2014 showed 2 tubular adenomas      Past Medical History  Diagnosis Date  . Allergic rhinitis   . Sinusitis   . Asthma   . Hypertension   . Venous insufficiency   . Hypercholesterolemia   . Overweight(278.02)   . GERD (gastroesophageal reflux disease)   . Diverticulosis of colon   . Hx of colonic polyps   . Elevated alkaline phosphatase level   . Sebaceous cyst     on back  . Wheezing     occasional  . Cough     occasional  . Leg swelling     occasional  . H/O urinary incontinence 11/06/2008  . Rectocele 11/06/2008    Large  . Cystocele 11/05/08  . Hard measles   . H/O varicella   . Yeast infection   . Abnormal Pap smear     Over 60yrs ago  . Anemia   . DJD (degenerative joint disease)     no per pt  . Arthritis     Past Surgical History  Procedure Laterality Date   . Apogee / perigee repair  10/2008    Dr. Octavio Manns  . Breast surgery  1978    cyst removed from both removed  . Abdominal hysterectomy  1988  . Bladder tack  2011  . Anterior and posterior repair  11/05/08  . Esophagogastroduodenoscopy    . Colonoscopy      Allergies  Allergen Reactions  . Demerol Hives    All over the body   . Lisinopril-Hydrochlorothiazide Hives    All over the body  . Penicillins Hives    All over the body    Family history and social history have been reviewed.  Review of Systems: Denies dysphagia. Positive for cough. Negative for heartburn   The remainder of the 10 point ROS is negative except as outlined in the H&P  Physical Exam: General Appearance Well developed, in no distress Eyes  Non icteric  HEENT  Non traumatic, normocephalic  Mouth No lesion, tongue papillated, no cheilosis Neck Supple without adenopathy, thyroid not enlarged, no carotid bruits, no JVD Lungs Clear to auscultation bilaterally COR Normal S1, normal S2, regular rhythm, no murmur, quiet precordium Abdomen  Rectal  Extremities  No pedal edema Skin No lesions Neurological Alert and oriented x 3 Psychological Normal mood and affect  Assessment  and Plan:   69 year old white female with known chronic cough for at least 10 years duration. Not successfully treated so far. May be a combination of reflux. LPR. Spasm. Or functional problem. I will send for full evaluation to have voice center at [facility] area at we will obtain barium esophagram and also upper endoscopy to rule out Barrett's esophagus. I will try to find the 24-hour pH probe results from 2002 although she may Need a new exam at the voice center.    Delfin Edis 12/30/2014

## 2014-12-30 NOTE — Patient Instructions (Addendum)
  You have been scheduled for a Barium Esophogram at [facility] (1st floor of the hospital) on 01/02/15 at 12:30pm. Please arrive 15 minutes prior to your appointment for registration. Make certain not to have anything to eat or drink 6 hours prior to your test. If you need to reschedule for any reason, please contact radiology at [phone] to do so. __________________________________________________________________ A barium swallow is an examination that concentrates on views of the esophagus. This tends to be a double contrast exam (barium and two liquids which, when combined, create a gas to distend the wall of the oesophagus) or single contrast (non-ionic iodine based). The study is usually tailored to your symptoms so a good history is essential. Attention is paid during the study to the form, structure and configuration of the esophagus, looking for functional disorders (such as aspiration, dysphagia, achalasia, motility and reflux) EXAMINATION You may be asked to change into a gown, depending on the type of swallow being performed. A radiologist and radiographer will perform the procedure. The radiologist will advise you of the type of contrast selected for your procedure and direct you during the exam. You will be asked to stand, sit or lie in several different positions and to hold a small amount of fluid in your mouth before being asked to swallow while the imaging is performed .In some instances you may be asked to swallow barium coated marshmallows to assess the motility of a solid food bolus. The exam can be recorded as a digital or video fluoroscopy procedure. POST PROCEDURE It will take 1-2 days for the barium to pass through your system. To facilitate this, it is important, unless otherwise directed, to increase your fluids for the next 24-48hrs and to resume your normal diet.  This test typically takes about 30 minutes to  perform. __________________________________________________________________________________  Wendy Figueroa have been scheduled for an endoscopy. Please follow written instructions given to you at your visit today. If you use inhalers (even only as needed), please bring them with you on the day of your procedure.  You will be contacted with appointment information to [facility].  Please call our office if you do not hear from them in 1 week.   I appreciate the opportunity to care for you.   CC: Dr Blanchie Serve

## 2015-01-02 ENCOUNTER — Ambulatory Visit (HOSPITAL_COMMUNITY)
Admission: RE | Admit: 2015-01-02 | Discharge: 2015-01-02 | Disposition: A | Discharge status: Home / Self Care | Payer: PPO | Source: Ambulatory Visit | Attending: Internal Medicine | Admitting: Internal Medicine

## 2015-01-02 DIAGNOSIS — R05 Cough: Secondary | ICD-10-CM | POA: Diagnosis not present

## 2015-01-02 DIAGNOSIS — K449 Diaphragmatic hernia without obstruction or gangrene: Secondary | ICD-10-CM | POA: Diagnosis not present

## 2015-01-02 DIAGNOSIS — R059 Cough, unspecified: Secondary | ICD-10-CM

## 2015-01-02 DIAGNOSIS — K219 Gastro-esophageal reflux disease without esophagitis: Secondary | ICD-10-CM | POA: Insufficient documentation

## 2015-01-07 ENCOUNTER — Other Ambulatory Visit: Payer: Self-pay | Admitting: Internal Medicine

## 2015-01-07 ENCOUNTER — Encounter: Payer: PPO | Admitting: Internal Medicine

## 2015-01-09 ENCOUNTER — Other Ambulatory Visit: Payer: Self-pay | Admitting: Internal Medicine

## 2015-01-09 ENCOUNTER — Ambulatory Visit (AMBULATORY_SURGERY_CENTER): Payer: PPO | Admitting: Internal Medicine

## 2015-01-09 ENCOUNTER — Encounter: Payer: Self-pay | Admitting: Internal Medicine

## 2015-01-09 VITALS — BP 140/78 | HR 81 | Temp 97.7°F | Resp 16 | Ht 62.0 in | Wt 218.0 lb

## 2015-01-09 DIAGNOSIS — K219 Gastro-esophageal reflux disease without esophagitis: Secondary | ICD-10-CM | POA: Diagnosis present

## 2015-01-09 DIAGNOSIS — R05 Cough: Secondary | ICD-10-CM | POA: Diagnosis not present

## 2015-01-09 DIAGNOSIS — K222 Esophageal obstruction: Secondary | ICD-10-CM

## 2015-01-09 DIAGNOSIS — R059 Cough, unspecified: Secondary | ICD-10-CM

## 2015-01-09 MED ORDER — SODIUM CHLORIDE 0.9 % IV SOLN: 500.0000 mL | INTRAVENOUS | Status: DC

## 2015-01-09 NOTE — Progress Notes (Signed)
Called to room to assist during endoscopic procedure.  Patient ID and intended procedure confirmed with present staff. Received instructions for my participation in the procedure from the performing physician.  

## 2015-01-09 NOTE — Progress Notes (Signed)
Transferred to PACU  Awake and alert.  NAD  Report to Santiago Glad, Therapist, sports.

## 2015-01-09 NOTE — Patient Instructions (Signed)
YOU HAD AN ENDOSCOPIC PROCEDURE TODAY AT [facility]:   Refer to the procedure report that was given to you for any specific questions about what was found during the examination.  If the procedure report does not answer your questions, please call your gastroenterologist to clarify.  If you requested that your care partner not be given the details of your procedure findings, then the procedure report has been included in a sealed envelope for you to review at your convenience later.  YOU SHOULD EXPECT: Some feelings of bloating in the abdomen. Passage of more gas than usual.  Walking can help get rid of the air that was put into your GI tract during the procedure and reduce the bloating. If you had a lower endoscopy (such as a colonoscopy or flexible sigmoidoscopy) you may notice spotting of blood in your stool or on the toilet paper. If you underwent a bowel prep for your procedure, you may not have a normal bowel movement for a few days.  Please Note:  You might notice some irritation and congestion in your nose or some drainage.  This is from the oxygen used during your procedure.  There is no need for concern and it should clear up in a day or so.  SYMPTOMS TO REPORT IMMEDIATELY:    Following upper endoscopy (EGD)  Vomiting of blood or coffee ground material  New chest pain or pain under the shoulder blades  Painful or persistently difficult swallowing  New shortness of breath  Fever of 100F or higher  Black, tarry-looking stools  For urgent or emergent issues, a gastroenterologist can be reached at any hour by calling [phone].   DIET: NOTHING TO EAT OR DRINK UNTIL 1130. 11:30 UNTIL 12:30 ONLY CLEAR LIQUIDS. 12;30 UNTIL MORNING ONLY SOFT FOODS. RESUME YOUR REGULAR DIET IN AM.  ACTIVITY:  You should plan to take it easy for the rest of today and you should NOT DRIVE or use heavy machinery until tomorrow (because of the sedation medicines used during the test).     FOLLOW UP: Our staff will call the number listed on your records the next business day following your procedure to check on you and address any questions or concerns that you may have regarding the information given to you following your procedure. If we do not reach you, we will leave a message.  However, if you are feeling well and you are not experiencing any problems, there is no need to return our call.  We will assume that you have returned to your regular daily activities without incident.  If any biopsies were taken you will be contacted by phone or by letter within the next 1-3 weeks.  Please call us at [phone] if you have not heard about the biopsies in 3 weeks.    SIGNATURES/CONFIDENTIALITY: You and/or your care partner have signed paperwork which will be entered into your electronic medical record.  These signatures attest to the fact that that the information above on your After Visit Summary has been reviewed and is understood.  Full responsibility of the confidentiality of this discharge information lies with you and/or your care-partner.

## 2015-01-09 NOTE — Op Note (Signed)
[facility]  [address] [place], [id number]   ENDOSCOPY PROCEDURE REPORT  PATIENT: Wendy Figueroa, Wendy Figueroa  MR#: [id number] BIRTHDATE: 1946/04/01 , 69  yrs. old GENDER: female ENDOSCOPIST: Lafayette Dragon, MD REFERRED BY:  Dr [name] PROCEDURE DATE:  01/09/2015 PROCEDURE:  EGD w/ biopsy and Maloney dilation of esophagus ASA CLASS:     Class II INDICATIONS:  Chronic cough.  Gastroesophageal reflux disease.  Last endoscopy in 2002 revealed 3 cm hiatal hernia and H.  pylori gastritis.Marland Kitchen MEDICATIONS: Monitored anesthesia care and Propofol 170 mg IV TOPICAL ANESTHETIC: none  DESCRIPTION OF PROCEDURE: After the risks benefits and alternatives of the procedure were thoroughly explained, informed consent was obtained.  The LB [id number] endoscope was introduced through the mouth and advanced to the second portion of the duodenum , Without limitations.  The instrument was slowly withdrawn as the mucosa was fully examined.    Esophagus: proximal, mid and distal esophageal mucosa was normal. There was a nonobstructing Schatzki's ring in distal esophagus about 2 cm proximal to the squamocolumnar junction. Was mild resistance when the endoscope traversed through. Abscess were taken from squamocolumnar junction to rule out Barrett's esophagus. There was a 3 cm stenting from 34-37 cm from the incisors  Stomach: gastric folds were normal. Gastric antrum and pyloric outlet was unremarkable. Biopsies were taken from gastric antrum to rule out recurrent H. pylori.  Duodenum:  duodenal bulb and descending duodenum was normal Maloney dilator 68 [place] passed through the distal esophagus without difficulty. There was no blood on the dilator[          The scope was then withdrawn from the patient and the procedure completed.  COMPLICATIONS: There were no immediate complications.  ENDOSCOPIC IMPRESSION: 1. mild distal esophageal stricture consistent with Schatzki's  ring. 2. Passage of 48 French Maloney dilator 3. Gastric antral biopsies to rule out recurrent H. pylori 4. biopsies from squamocolumnar junction to rule out chronic esophagitis or Barrett's esophagus RECOMMENDATIONS: 1.  Await pathology results 2.  Anti-reflux regimen to be follow 3.  Continue PPI 4. Pending appointment at [facility]: for EGD pending biopsy results.  eSigned:  Lafayette Dragon, MD 01/09/2015 10:49 AM    CC:  PATIENT NAME:  Toluwani, Ruder MR#: [id number]

## 2015-01-10 ENCOUNTER — Other Ambulatory Visit: Payer: Self-pay | Admitting: Internal Medicine

## 2015-01-12 ENCOUNTER — Telehealth: Payer: Self-pay

## 2015-01-12 NOTE — Telephone Encounter (Signed)
  Follow up Call-  Call back number 01/09/2015 06/12/2013  Post procedure Call Back phone  # [phone]  Permission to leave phone message Yes Yes     Patient questions:  Do you have a fever, pain , or abdominal swelling? No. Pain Score  0 *  Have you tolerated food without any problems? Yes.    Have you been able to return to your normal activities? Yes.    Do you have any questions about your discharge instructions: Diet   No. Medications  No. Follow up visit  No.  Do you have questions or concerns about your Care? No.  Actions: * If pain score is 4 or above: No action needed, pain <4.

## 2015-01-15 ENCOUNTER — Encounter: Payer: Self-pay | Admitting: Internal Medicine

## 2015-01-21 ENCOUNTER — Encounter: Payer: Self-pay | Admitting: Internal Medicine

## 2015-01-21 ENCOUNTER — Ambulatory Visit (INDEPENDENT_AMBULATORY_CARE_PROVIDER_SITE_OTHER): Payer: PPO | Admitting: Internal Medicine

## 2015-01-21 VITALS — BP 148/84 | HR 85 | Temp 98.3°F | Resp 20 | Ht 62.0 in | Wt 220.0 lb

## 2015-01-21 DIAGNOSIS — R748 Abnormal levels of other serum enzymes: Secondary | ICD-10-CM

## 2015-01-21 DIAGNOSIS — J3089 Other allergic rhinitis: Secondary | ICD-10-CM | POA: Diagnosis not present

## 2015-01-21 DIAGNOSIS — J4520 Mild intermittent asthma, uncomplicated: Secondary | ICD-10-CM

## 2015-01-21 DIAGNOSIS — I10 Essential (primary) hypertension: Secondary | ICD-10-CM | POA: Diagnosis not present

## 2015-01-21 DIAGNOSIS — E785 Hyperlipidemia, unspecified: Secondary | ICD-10-CM | POA: Diagnosis not present

## 2015-01-21 DIAGNOSIS — K219 Gastro-esophageal reflux disease without esophagitis: Secondary | ICD-10-CM | POA: Diagnosis not present

## 2015-01-21 NOTE — Patient Instructions (Addendum)
Continue current medications as ordered  Follow up with specialists as scheduled  Follow up in 4 mos for [place] for Gastroesophageal Reflux Disease When you have gastroesophageal reflux disease (GERD), the foods you eat and your eating habits are very important. Choosing the right foods can help ease the discomfort of GERD. WHAT GENERAL GUIDELINES DO I NEED TO FOLLOW?  Choose fruits, vegetables, whole grains, low-fat dairy products, and low-fat meat, fish, and poultry.  Limit fats such as oils, salad dressings, butter, nuts, and avocado.  Keep a food diary to identify foods that cause symptoms.  Avoid foods that cause reflux. These may be different for different people.  Eat frequent small meals instead of three large meals each day.  Eat your meals slowly, in a relaxed setting.  Limit fried foods.  Cook foods using methods other than frying.  Avoid drinking alcohol.  Avoid drinking large amounts of liquids with your meals.  Avoid bending over or lying down until 2-3 hours after eating. WHAT FOODS ARE NOT RECOMMENDED? The following are some foods and drinks that may worsen your symptoms: Vegetables Tomatoes. Tomato juice. Tomato and spaghetti sauce. Chili peppers. Onion and garlic. Horseradish. Fruits Oranges, grapefruit, and lemon (fruit and juice). Meats High-fat meats, fish, and poultry. This includes hot dogs, ribs, ham, sausage, salami, and bacon. Dairy Whole milk and chocolate milk. Sour cream. Cream. Butter. Ice cream. Cream cheese.  Beverages Coffee and tea, with or without caffeine. Carbonated beverages or energy drinks. Condiments Hot sauce. Barbecue sauce.  Sweets/Desserts Chocolate and cocoa. Donuts. Peppermint and spearmint. Fats and Oils High-fat foods, including [place] fries and potato chips. Other Vinegar. Strong spices, such as black pepper, white pepper, red pepper, cayenne, curry powder, cloves, ginger, and chili powder. The items  listed above may not be a complete list of foods and beverages to avoid. Contact your dietitian for more information. Document Released: 08/08/2005 Document Revised: 08/13/2013 Document Reviewed: 06/12/2013 [facility] Patient Information 2015 [place], [place]. This information is not intended to replace advice given to you by your health care provider. Make sure you discuss any questions you have with your health care provider.

## 2015-01-21 NOTE — Progress Notes (Signed)
Patient ID: Wendy Figueroa, female   DOB: 12-20-1945, 69 y.o.   MRN: [id number]    Location:    [facility]   Place of Service:  OFFICE    Chief Complaint  Patient presents with  . Medical Management of Chronic Issues    3 month follow-up,labs printed    HPI:  69 yo female seen today for f/u. She reports feeling well overall. No concerns. She was seen by [facility] ENT yesterday for chronic cough. Protonix increased to BID and started on zegerid 300mg  qhs. Vocal cords red and swollen due to acid reflux. No polyps. She will f/u in August.   She is overweight and is working to cut back. Plans to reduce snacking and "cut back" on portion sizes. She bakes and grills most foods. Eats fried food once weekly. She does not walk as much as [facility] used to [facility]. She does attempt to walk in the mall.   BP elevated today. She has not taken lasix yet due to increased urinary frequency.   Past Medical History  Diagnosis Date  . Allergic rhinitis   . Sinusitis   . Asthma   . Hypertension   . Venous insufficiency   . Hypercholesterolemia   . Overweight(278.02)   . GERD (gastroesophageal reflux disease)   . Diverticulosis of colon   . Hx of colonic polyps   . Elevated alkaline phosphatase level   . Sebaceous cyst     on back  . Wheezing     occasional  . Cough     occasional  . Leg swelling     occasional  . H/O urinary incontinence 11/06/2008  . Rectocele 11/06/2008    Large  . Cystocele 11/05/08  . Hard measles   . H/O varicella   . Yeast infection   . Abnormal Pap smear     Over 56yrs ago  . Anemia   . DJD (degenerative joint disease)     no per pt  . Arthritis     Past Surgical History  Procedure Laterality Date  . [place]ogee / perigee repair  10/2008    Dr. Octavio Manns  . Breast surgery  1978    cyst removed from both removed  . Abdominal hysterectomy  1988  . Bladder tack  2011  . Anterior and posterior repair  11/05/08  .  Esophagogastroduodenoscopy    . Colonoscopy      Patient Care Team: [name], DO as PCP - General (Internal Medicine)  History   Social History  . Marital Status: Married    Spouse Name: [name]  . Number of Children: 2  . Years of Education: N/A   Occupational History  . LPN     Social History Main Topics  . Smoking status: Never Smoker   . Smokeless tobacco: Never Used  . Alcohol Use: Yes     Comment: social use  . Drug Use: No  . Sexual Activity: Not on file   Other Topics Concern  . Not on file   Social History Narrative   Married   Never smoked   Alcohol few beers on week-end   Exercise walk 5 days a week for one hour   No Advance Directive      reports that she has never smoked. She has never used smokeless tobacco. She reports that she drinks alcohol. She reports that she does not use illicit drugs.  Allergies  Allergen Reactions  . Demerol  Hives    All over the body   . Lisinopril-Hydrochlorothiazide Hives    All over the body  . Penicillins Hives    All over the body    Medications: Patient's Medications  New Prescriptions   No medications on file  Previous Medications   ALBUTEROL (PROVENTIL HFA;VENTOLIN HFA) 108 (90 BASE) MCG/ACT INHALER    Inhale 2 puffs into the lungs every 6 (six) hours as needed.     BUDESONIDE-FORMOTEROL (SYMBICORT) 160-4.5 MCG/ACT INHALER    Inhale 2 puffs into the lungs 2 (two) times daily.     CALCIUM CARB-CHOLECALCIFEROL (CALCIUM 600 + D PO)    Take by mouth. Take one tablet daily   FLUTICASONE (FLONASE) 50 MCG/ACT NASAL SPRAY    2 sprays daily.   FUROSEMIDE (LASIX) 40 MG TABLET    Take 1 tablet (40 mg total) by mouth daily.   HYDROCODONE-CHLORPHENIRAMINE 5-4 MG/5ML SOLN    Take 5 mLs by mouth 4 (four) times daily as needed.   HYDROXYZINE (ATARAX/VISTARIL) 25 MG TABLET    TAKE 1 TABLET (25 MG TOTAL) BY MOUTH EVERY 6 (SIX) HOURS AS NEEDED FOR ITCHING.   KLOR-CON M20 20 MEQ TABLET    TAKE 2 TABLETS BY MOUTH DAILY FOR  POTASSIUM SUPPLEMENT   LEVOCETIRIZINE (XYZAL) 5 MG TABLET    Take 5 mg by mouth every evening.     LOSARTAN (COZAAR) 25 MG TABLET    Take one tablet by mouth once daily for blood pressure   LOSARTAN (COZAAR) 50 MG TABLET    Take one tablet by mouth once daily for blood pressure   MONTELUKAST (SINGULAIR) 10 MG TABLET    Take 10 mg by mouth at bedtime.     MULTIPLE VITAMINS-MINERALS (MACULAR VITAMIN BENEFIT PO)    Take 1 capsule by mouth 2 (two) times daily.     NIFEDIPINE (PROCARDIA XL/ADALAT-CC) 60 MG 24 HR TABLET    Take 1 tablet (60 mg total) by mouth daily.   NON FORMULARY    Allergy shots once weekly   OMEGA-3 FATTY ACIDS (FISH OIL PO)    Take by mouth daily.   PANTOPRAZOLE (PROTONIX) 40 MG TABLET    TAKE ONE TABLET BY MOUTH ONCE DAILY FOR STOMACH   PRAVASTATIN (PRAVACHOL) 40 MG TABLET    Take 1 tablet (40 mg total) by mouth daily.   RANITIDINE (ZANTAC) 300 MG TABLET    Take 300 mg by mouth at bedtime.   SPIRIVA HANDIHALER 18 MCG INHALATION CAPSULE    18 mcg 2 (two) times daily.  Modified Medications   No medications on file  Discontinued Medications   PANTOPRAZOLE (PROTONIX) 40 MG TABLET    TAKE ONE TABLET BY MOUTH ONCE DAILY FOR STOMACH    Review of Systems  Constitutional: Negative for fever, chills, diaphoresis, activity change, appetite change and fatigue.  HENT: Negative for ear pain and sore throat.   Eyes: Negative for visual disturbance.  Respiratory: Negative for cough, chest tightness and shortness of breath.   Cardiovascular: Negative for chest pain, palpitations and leg swelling.  Gastrointestinal: Negative for nausea, vomiting, abdominal pain, diarrhea, constipation and blood in stool.  Genitourinary: Negative for dysuria.  Musculoskeletal: Negative for arthralgias.  Neurological: Negative for dizziness, tremors, numbness and headaches.  Psychiatric/Behavioral: Negative for sleep disturbance. The patient is not nervous/anxious.     Filed Vitals:   01/21/15 0917    BP: 150/90  Pulse: 85  Temp: 98.3 F (36.8 C)  TempSrc: Oral  Resp: 20  Height: 5\' 2"  (1.575 m)  Weight: 220 lb (99.791 kg)  SpO2: 97%   Body mass index is 40.23 kg/(m^2).  Physical Exam  Constitutional: She is oriented to person, place, and time. She appears well-developed and well-nourished.  HENT:  Mouth/Throat: Oropharynx is clear and moist. No oropharyngeal exudate.  Eyes: Pupils are equal, round, and reactive to light. No scleral icterus.  Neck: Neck supple. No tracheal deviation present. No thyromegaly present.  Cardiovascular: Normal rate, regular rhythm, normal heart sounds and intact distal pulses.  Exam reveals no gallop and no friction rub.   No murmur heard. Trace LE edema b/l. no calf TTP. No carotid bruit b/l  Pulmonary/Chest: Effort normal and breath sounds normal. No stridor. No respiratory distress. She has no wheezes. She has no rales.  Abdominal: Soft. Bowel sounds are normal. She exhibits no distension and no mass. There is no tenderness. There is no rebound and no guarding.  Lymphadenopathy:    She has no cervical adenopathy.  Neurological: She is alert and oriented to person, place, and time. She has normal reflexes.  Skin: Skin is warm and dry. No rash noted.  Psychiatric: She has a normal mood and affect. Her behavior is normal. Judgment and thought content normal.     Labs reviewed: Appointment on 12/10/2014  Component Date Value Ref Range Status  . Glucose 12/10/2014 92  65 - 99 mg/dL Final  . BUN 12/10/2014 12  8 - 27 mg/dL Final  . Creatinine, Ser 12/10/2014 0.75  0.57 - 1.00 mg/dL Final  . GFR calc non Af Amer 12/10/2014 80  >59 mL/min/1.73 Final  . GFR calc Af Amer 12/10/2014 93  >59 mL/min/1.73 Final  . BUN/Creatinine Ratio 12/10/2014 16  11 - 26 Final  . Sodium 12/10/2014 143  134 - 144 mmol/L Final  . Potassium 12/10/2014 4.0  3.5 - 5.2 mmol/L Final  . Chloride 12/10/2014 105  97 - 108 mmol/L Final  . CO2 12/10/2014 22  18 - 29 mmol/L  Final  . Calcium 12/10/2014 9.6  8.7 - 10.3 mg/dL Final  . Total Protein 12/10/2014 7.1  6.0 - 8.5 g/dL Final  . Albumin 12/10/2014 4.2  3.5 - 4.8 g/dL Final  . Globulin, Total 12/10/2014 2.9  1.5 - 4.5 g/dL Final  . Albumin/Globulin Ratio 12/10/2014 1.4  1.1 - 2.5 Final  . Bilirubin Total 12/10/2014 0.4  0.0 - 1.2 mg/dL Final  . Alkaline Phosphatase 12/10/2014 157* 39 - 117 IU/L Final  . AST 12/10/2014 22  0 - 40 IU/L Final  . ALT 12/10/2014 17  0 - 32 IU/L Final  . Bilirubin, Direct 12/10/2014 0.14  0.00 - 0.40 mg/dL Final    Dg Esophagus  01/02/2015   CLINICAL DATA:  Cough.  Gastroesophageal reflux disease.  EXAM: ESOPHOGRAM / BARIUM SWALLOW / BARIUM TABLET STUDY  TECHNIQUE: Combined double contrast and single contrast examination performed using effervescent crystals, thick barium liquid, and thin barium liquid. The patient was observed with fluoroscopy swallowing a 13 mm barium sulphate tablet.  FLUOROSCOPY TIME:  Fluoroscopy Time:  1 minutes 32 seconds  COMPARISON:  None.  FINDINGS: There is small hiatal hernia. The oropharyngeal swallowing mechanisms are normal. There is intense prolonged contraction of the cricopharyngeus muscle with what appears to be a tiny Zenker's diverticulum seen on series 8.  There is a good primary stripping wave but there is a poor secondary stripping wave with occasional tertiary contractions. There is a small hiatal hernia with a single episode of  spontaneous gastroesophageal reflux. There is no stricture. A 13 mm barium tablet passed immediately from the mouth to the stomach with no delay. No visible esophagitis.  IMPRESSION: 1. Small hiatal hernia with spontaneous gastroesophageal reflux. 2. Persistent contraction of the cricopharyngeal this muscle with what appears to be a tiny Zenker's diverticulum.   Electronically Signed   By: Lorriane Shire M.D.   On: 01/02/2015 14:25     Assessment/Plan   ICD-9-CM ICD-10-CM   1. Essential hypertension - stable; cont  meds 401.9 I10   2. Other allergic rhinitis - stable 477.8 J30.89   3. Gastroesophageal reflux disease, esophagitis presence not specified - cont meds 530.81 K21.9   4. Asthma, mild intermittent, uncomplicated - stable 563.89 J45.20   5. Hyperlipidemia LDL goal <130 - cont statin 272.4 E78.5   6. Alkaline phosphatase elevation - stable; probably due to arthritis 790.5 R74.8     --Continue current medications as ordered  --discussed GERD friendly diet. Education material provided  --Follow up with specialists as scheduled  --Follow up in 4 mos for CPE/medicare. Fasting labs prior to appt  [place] S. Perlie Gold  [facility] [address] [place], [place] [address] [phone] Cell (Monday-Friday 8 AM - 5 PM) [phone] After 5 PM and follow prompts

## 2015-03-11 ENCOUNTER — Encounter: Payer: Self-pay | Admitting: Podiatry

## 2015-03-11 ENCOUNTER — Ambulatory Visit (INDEPENDENT_AMBULATORY_CARE_PROVIDER_SITE_OTHER): Payer: PPO | Admitting: Podiatry

## 2015-03-11 DIAGNOSIS — M79676 Pain in unspecified toe(s): Secondary | ICD-10-CM | POA: Diagnosis not present

## 2015-03-11 DIAGNOSIS — B351 Tinea unguium: Secondary | ICD-10-CM

## 2015-03-12 NOTE — Progress Notes (Signed)
Patient ID: Wendy Figueroa, female   DOB: 1946/02/22, 69 y.o.   MRN: [id number]  Subjective: This patient presents for scheduled visit complaining of painful toenails when walking wearing shoes and requests toenail debridement  Objective: The toenails are hypertrophic, incurvated, discolored, elongated and tender to direct palpation 6-10  Assessment: Symptomatic onychomycoses 6-10  Plan: Debridement of toenails 10 and mechanically and electrically without any bleeding  Reappoint 3 months

## 2015-03-20 ENCOUNTER — Other Ambulatory Visit: Payer: Self-pay | Admitting: Internal Medicine

## 2015-03-24 ENCOUNTER — Other Ambulatory Visit: Payer: Self-pay | Admitting: Internal Medicine

## 2015-04-02 DIAGNOSIS — R0989 Other specified symptoms and signs involving the circulatory and respiratory systems: Secondary | ICD-10-CM | POA: Insufficient documentation

## 2015-04-02 DIAGNOSIS — K224 Dyskinesia of esophagus: Secondary | ICD-10-CM | POA: Insufficient documentation

## 2015-04-13 ENCOUNTER — Telehealth: Payer: Self-pay | Admitting: Pulmonary Disease

## 2015-04-13 MED ORDER — HYDROCOD POLST-CPM POLST ER 10-8 MG/5ML PO SUER
5.0000 mL | Freq: Two times a day (BID) | ORAL | Status: DC | PRN
Start: 2015-04-13 — End: 2015-06-04

## 2015-04-13 NOTE — Telephone Encounter (Signed)
Last OV: 12/16/14 No OV scheduled. Patient requesting refill on Tussinex cough syrup Last refilled: 10/13/2014  Current Outpatient Prescriptions on File Prior to Visit  Medication Sig Dispense Refill  . albuterol (PROVENTIL HFA;VENTOLIN HFA) 108 (90 BASE) MCG/ACT inhaler Inhale 2 puffs into the lungs every 6 (six) hours as needed.      . budesonide-formoterol (SYMBICORT) 160-4.5 MCG/ACT inhaler Inhale 2 puffs into the lungs 2 (two) times daily.      . Calcium Carb-Cholecalciferol (CALCIUM 600 + D PO) Take by mouth. Take one tablet daily    . fluticasone (FLONASE) 50 MCG/ACT nasal spray 2 sprays daily.  3  . furosemide (LASIX) 40 MG tablet Take 1 tablet (40 mg total) by mouth daily. 90 tablet 1  . Hydrocodone-Chlorpheniramine 5-4 MG/5ML SOLN Take 5 mLs by mouth 4 (four) times daily as needed. 480 mL 0  . hydrOXYzine (ATARAX/VISTARIL) 25 MG tablet TAKE 1 TABLET (25 MG TOTAL) BY MOUTH EVERY 6 (SIX) HOURS AS NEEDED FOR ITCHING. 50 tablet 1  . KLOR-CON M20 20 MEQ tablet TAKE 2 TABLETS BY MOUTH DAILY FOR POTASSIUM SUPPLEMENT 180 tablet 1  . levocetirizine (XYZAL) 5 MG tablet Take 5 mg by mouth every evening.      Marland Kitchen losartan (COZAAR) 25 MG tablet TAKE ONE TABLET BY MOUTH ONCE DAILY FOR BLOOD PRESSURE 30 tablet 3  . losartan (COZAAR) 50 MG tablet TAKE ONE TABLET BY MOUTH ONCE DAILY FOR BLOOD PRESSURE 30 tablet 3  . losartan (COZAAR) 50 MG tablet TAKE ONE TABLET BY MOUTH ONCE DAILY FOR BLOOD PRESSURE 30 tablet 3  . montelukast (SINGULAIR) 10 MG tablet Take 10 mg by mouth at bedtime.      . Multiple Vitamins-Minerals (MACULAR VITAMIN BENEFIT PO) Take 1 capsule by mouth 2 (two) times daily.      Marland Kitchen NIFEdipine (PROCARDIA XL/ADALAT-CC) 60 MG 24 hr tablet Take 1 tablet (60 mg total) by mouth daily. 90 tablet 1  . NON FORMULARY Allergy shots once weekly    . Omega-3 Fatty Acids (FISH OIL PO) Take by mouth daily.    . pantoprazole (PROTONIX) 40 MG tablet TAKE ONE TABLET BY MOUTH ONCE DAILY FOR STOMACH (Patient  taking differently: Take 1 tablet by mouth at breakfast and 1 tablet by mouth at dinner) 30 tablet 3  . pravastatin (PRAVACHOL) 40 MG tablet Take 1 tablet (40 mg total) by mouth daily. 90 tablet 3  . ranitidine (ZANTAC) 300 MG tablet Take 300 mg by mouth at bedtime.    Marland Kitchen SPIRIVA HANDIHALER 18 MCG inhalation capsule 18 mcg 2 (two) times daily.  6   No current facility-administered medications on file prior to visit.   Allergies  Allergen Reactions  . Demerol Hives    All over the body   . Lisinopril-Hydrochlorothiazide Hives    All over the body  . Penicillins Hives    All over the body

## 2015-04-13 NOTE — Telephone Encounter (Signed)
Rx printed and left at front for patient to pick up. Patient notified. Nothing further needed.

## 2015-05-25 ENCOUNTER — Other Ambulatory Visit: Payer: PPO

## 2015-05-27 ENCOUNTER — Other Ambulatory Visit: Payer: PPO

## 2015-05-27 DIAGNOSIS — I10 Essential (primary) hypertension: Secondary | ICD-10-CM

## 2015-05-27 DIAGNOSIS — R748 Abnormal levels of other serum enzymes: Secondary | ICD-10-CM

## 2015-05-27 DIAGNOSIS — E785 Hyperlipidemia, unspecified: Secondary | ICD-10-CM

## 2015-05-28 LAB — COMPREHENSIVE METABOLIC PANEL
ALK PHOS: 159 IU/L — AB (ref 39–117)
ALT: 17 IU/L (ref 0–32)
AST: 22 IU/L (ref 0–40)
Albumin/Globulin Ratio: 1.3 (ref 1.1–2.5)
Albumin: 4.1 g/dL (ref 3.5–4.8)
BUN/Creatinine Ratio: 16 (ref 11–26)
BUN: 14 mg/dL (ref 8–27)
Bilirubin Total: 0.3 mg/dL (ref 0.0–1.2)
CHLORIDE: 104 mmol/L (ref 97–108)
CO2: 25 mmol/L (ref 18–29)
Calcium: 9.3 mg/dL (ref 8.7–10.3)
Creatinine, Ser: 0.86 mg/dL (ref 0.57–1.00)
GFR calc Af Amer: 79 mL/min/{1.73_m2} (ref >59)
GFR calc non Af Amer: 68 mL/min/{1.73_m2} (ref >59)
GLUCOSE: 95 mg/dL (ref 65–99)
Globulin, Total: 3.1 g/dL (ref 1.5–4.5)
Potassium: 4.4 mmol/L (ref 3.5–5.2)
Sodium: 144 mmol/L (ref 134–144)
Total Protein: 7.2 g/dL (ref 6.0–8.5)

## 2015-05-28 LAB — CBC WITH DIFFERENTIAL/PLATELET
BASOS: 1 %
Basophils Absolute: 0 10*3/uL (ref 0.0–0.2)
EOS (ABSOLUTE): 0.2 10*3/uL (ref 0.0–0.4)
EOS: 4 %
HEMATOCRIT: 36.1 % (ref 34.0–46.6)
Hemoglobin: 11.7 g/dL (ref 11.1–15.9)
Immature Grans (Abs): 0 10*3/uL (ref 0.0–0.1)
Immature Granulocytes: 0 %
Lymphocytes Absolute: 2.1 10*3/uL (ref 0.7–3.1)
Lymphs: 40 %
MCH: 26.1 pg — ABNORMAL LOW (ref 26.6–33.0)
MCHC: 32.4 g/dL (ref 31.5–35.7)
MCV: 81 fL (ref 79–97)
MONOCYTES: 9 %
Monocytes Absolute: 0.5 10*3/uL (ref 0.1–0.9)
NEUTROS ABS: 2.5 10*3/uL (ref 1.4–7.0)
Neutrophils: 46 %
Platelets: 279 10*3/uL (ref 150–379)
RBC: 4.48 x10E6/uL (ref 3.77–5.28)
RDW: 15.4 % (ref 12.3–15.4)
WBC: 5.3 10*3/uL (ref 3.4–10.8)

## 2015-05-28 LAB — LIPID PANEL
CHOLESTEROL TOTAL: 177 mg/dL (ref 100–199)
Chol/HDL Ratio: 2.7 ratio units (ref 0.0–4.4)
HDL: 66 mg/dL (ref >39)
LDL Calculated: 100 mg/dL — ABNORMAL HIGH (ref 0–99)
TRIGLYCERIDES: 55 mg/dL (ref 0–149)
VLDL CHOLESTEROL CAL: 11 mg/dL (ref 5–40)

## 2015-05-28 LAB — URINALYSIS, ROUTINE W REFLEX MICROSCOPIC
BILIRUBIN UA: NEGATIVE
Glucose, UA: NEGATIVE
Ketones, UA: NEGATIVE
Nitrite, UA: NEGATIVE
Protein, UA: NEGATIVE
RBC, UA: NEGATIVE
Specific Gravity, UA: 1.01 (ref 1.005–1.030)
Urobilinogen, Ur: 1 mg/dL (ref 0.2–1.0)
pH, UA: 7 (ref 5.0–7.5)

## 2015-05-28 LAB — MICROSCOPIC EXAMINATION: Casts: NONE SEEN /lpf

## 2015-05-28 LAB — TSH: TSH: 1.28 u[IU]/mL (ref 0.450–4.500)

## 2015-05-29 ENCOUNTER — Ambulatory Visit (INDEPENDENT_AMBULATORY_CARE_PROVIDER_SITE_OTHER): Payer: PPO | Admitting: Internal Medicine

## 2015-05-29 ENCOUNTER — Encounter: Payer: Self-pay | Admitting: Internal Medicine

## 2015-05-29 VITALS — BP 128/92 | HR 96 | Temp 98.3°F | Resp 20 | Ht 62.0 in | Wt 220.4 lb

## 2015-05-29 DIAGNOSIS — J3089 Other allergic rhinitis: Secondary | ICD-10-CM | POA: Diagnosis not present

## 2015-05-29 DIAGNOSIS — J387 Other diseases of larynx: Secondary | ICD-10-CM

## 2015-05-29 DIAGNOSIS — R748 Abnormal levels of other serum enzymes: Secondary | ICD-10-CM

## 2015-05-29 DIAGNOSIS — I10 Essential (primary) hypertension: Secondary | ICD-10-CM

## 2015-05-29 DIAGNOSIS — E876 Hypokalemia: Secondary | ICD-10-CM | POA: Diagnosis not present

## 2015-05-29 DIAGNOSIS — M8580 Other specified disorders of bone density and structure, unspecified site: Secondary | ICD-10-CM

## 2015-05-29 DIAGNOSIS — K219 Gastro-esophageal reflux disease without esophagitis: Secondary | ICD-10-CM | POA: Diagnosis not present

## 2015-05-29 DIAGNOSIS — Z0000 Encounter for general adult medical examination without abnormal findings: Secondary | ICD-10-CM

## 2015-05-29 DIAGNOSIS — R059 Cough, unspecified: Secondary | ICD-10-CM

## 2015-05-29 DIAGNOSIS — Z6841 Body Mass Index (BMI) 40.0 and over, adult: Secondary | ICD-10-CM

## 2015-05-29 DIAGNOSIS — Z23 Encounter for immunization: Secondary | ICD-10-CM

## 2015-05-29 DIAGNOSIS — H6121 Impacted cerumen, right ear: Secondary | ICD-10-CM

## 2015-05-29 DIAGNOSIS — H6120 Impacted cerumen, unspecified ear: Secondary | ICD-10-CM | POA: Insufficient documentation

## 2015-05-29 DIAGNOSIS — J4520 Mild intermittent asthma, uncomplicated: Secondary | ICD-10-CM | POA: Insufficient documentation

## 2015-05-29 DIAGNOSIS — Z1239 Encounter for other screening for malignant neoplasm of breast: Secondary | ICD-10-CM

## 2015-05-29 DIAGNOSIS — R053 Chronic cough: Secondary | ICD-10-CM | POA: Insufficient documentation

## 2015-05-29 DIAGNOSIS — E785 Hyperlipidemia, unspecified: Secondary | ICD-10-CM

## 2015-05-29 DIAGNOSIS — R05 Cough: Secondary | ICD-10-CM

## 2015-05-29 DIAGNOSIS — R490 Dysphonia: Secondary | ICD-10-CM

## 2015-05-29 HISTORY — DX: Gastro-esophageal reflux disease without esophagitis: K21.9

## 2015-05-29 MED ORDER — BENZONATATE 200 MG PO CAPS: 200.0000 mg | ORAL_CAPSULE | Freq: Three times a day (TID) | ORAL | Status: DC | PRN

## 2015-05-29 NOTE — Progress Notes (Signed)
Failed clock test 

## 2015-05-29 NOTE — Progress Notes (Signed)
Patient ID: Wendy Figueroa, female   DOB: [birth year], 69 y.o.   MRN: [id number] Subjective:     Wendy Figueroa is a 69 y.o. female and is here for a comprehensive physical exam. The patient reports problems - cough. She is seeing ENT at [facility]. PPI increased to BID and Rx zantac 2 mos ago but she continues to cough. She has seen a speech pathologist to see learn different swallowing methods. Pt reports difficulty swallowing water and that she gets strangled when drinking water.  Past Medical History  Diagnosis Date  . Allergic rhinitis   . Sinusitis   . Asthma   . Hypertension   . Venous insufficiency   . Hypercholesterolemia   . Overweight(278.02)   . GERD (gastroesophageal reflux disease)   . Diverticulosis of colon   . Hx of colonic polyps   . Elevated alkaline phosphatase level   . Sebaceous cyst     on back  . Wheezing     occasional  . Cough     occasional  . Leg swelling     occasional  . H/O urinary incontinence 11/06/2008  . Rectocele 11/06/2008    Large  . Cystocele 11/05/08  . Hard measles   . H/O varicella   . Yeast infection   . Abnormal Pap smear     Over 29yrs ago  . Anemia   . DJD (degenerative joint disease)     no per pt  . Arthritis   . ALKALINE PHOSPHATASE, ELEVATED 07/01/2009    Qualifier: Diagnosis of  By: [name] MD, [name]    Past Surgical History  Procedure Laterality Date  . Apogee / perigee repair  10/2008    Dr. Octavio Manns  . Breast surgery  1978    cyst removed from both removed  . Abdominal hysterectomy  1988  . Bladder tack  2011  . Anterior and posterior repair  11/05/08  . Esophagogastroduodenoscopy    . Colonoscopy     Family History  Problem Relation Age of Onset  . Aneurysm Mother   . Lung cancer Father   . Colon cancer Neg Hx   . Esophageal cancer Neg Hx   . Stomach cancer Neg Hx   . Rectal cancer Neg Hx   . Hypertension Daughter   . Hypertension Daughter   . Colon polyps Neg Hx     Social History   Social  History  . Marital Status: Married    Spouse Name: [name]  . Number of Children: 2  . Years of Education: N/A   Occupational History  . LPN     Social History Main Topics  . Smoking status: Never Smoker   . Smokeless tobacco: Never Used  . Alcohol Use: Yes     Comment: social use  . Drug Use: No  . Sexual Activity: Not on file   Other Topics Concern  . Not on file   Social History Narrative   Married   Never smoked   Alcohol few beers on week-end   Exercise walk 5 days a week for one hour   No Advance Directive    Health Maintenance  Topic Date Due  . Hepatitis C Screening  1946-07-02  . TETANUS/TDAP  10/29/1962  . ZOSTAVAX  10/29/2003  . MAMMOGRAM  03/19/2015  . INFLUENZA VACCINE  03/23/2015  . COLONOSCOPY  06/12/2018  . DEXA SCAN  Completed  . PNA vac Low Risk Adult  Completed    Review of Systems  Review of Systems  Constitutional: Negative for fever, chills and malaise/fatigue.  HENT: Negative for sore throat and tinnitus.   Eyes: Negative for blurred vision and double vision.  Respiratory: Positive for cough. Negative for shortness of breath and wheezing.   Cardiovascular: Negative for chest pain, palpitations, orthopnea and leg swelling.  Gastrointestinal: Negative for heartburn, nausea, vomiting, abdominal pain, diarrhea, constipation and blood in stool.  Genitourinary: Negative for dysuria, urgency, frequency and hematuria.  Musculoskeletal: Negative for myalgias, joint pain and falls.  Skin: Negative for rash.  Neurological: Negative for dizziness, tingling, tremors, sensory change, focal weakness, seizures, loss of consciousness, weakness and headaches.  Endo/Heme/Allergies: Negative for environmental allergies. Does not bruise/bleed easily.  Psychiatric/Behavioral: Negative for depression and memory loss. The patient is not nervous/anxious and does not have insomnia.      Objective:   Filed Vitals:   05/29/15 0949  BP: 128/92  Pulse: 96   Temp: 98.3 F (36.8 C)  TempSrc: Oral  Resp: 20  Height: 5\' 2"  (1.575 m)  Weight: 220 lb 6.4 oz (99.973 kg)  SpO2: 98%       Physical Exam  Constitutional: She is oriented to person, place, and time and well-developed, well-nourished, and in no distress.  HENT:  Head: Normocephalic and atraumatic.  Left Ear: External ear normal.  Mouth/Throat: Oropharynx is clear and moist. No oropharyngeal exudate.  Right cerumen impaction   Eyes: Conjunctivae and EOM are normal. Pupils are equal, round, and reactive to light. No scleral icterus.  Neck: Normal range of motion. Neck supple. Carotid bruit is not present. No tracheal deviation present. No thyromegaly present.  Cardiovascular: Normal rate, regular rhythm, normal heart sounds and intact distal pulses.  Exam reveals no gallop and no friction rub.   No murmur heard. B/l LE +1 pitting edema. No calf TTP. She wears TED stockings  Pulmonary/Chest: Effort normal and breath sounds normal. She has no wheezes. She has no rhonchi. She has no rales. She exhibits no tenderness. Right breast exhibits no inverted nipple, no mass, no nipple discharge, no skin change and no tenderness. Left breast exhibits no inverted nipple, no mass, no nipple discharge, no skin change and no tenderness. Breasts are symmetrical.  Abdominal: Soft. Bowel sounds are normal. She exhibits no distension and no mass. There is no hepatosplenomegaly. There is no tenderness. There is no rebound and no guarding.  Genitourinary: Rectal exam shows no external hemorrhoid, no internal hemorrhoid, no fissure, no laceration, no mass and no tenderness.  Deferred to GYN  Musculoskeletal: She exhibits edema and tenderness.  Lymphadenopathy:    She has no cervical adenopathy.  Neurological: She is alert and oriented to person, place, and time. She has normal reflexes. Gait normal.  Skin: Skin is warm and dry. No rash noted.  Psychiatric: Mood, memory, affect and judgment normal.   Vision  screening: OD 20/25; OS 20/20 with glasses   MMSE - Mini Mental State Exam 05/29/2015  Orientation to time 5  Orientation to Place 5  Registration 3  Attention/ Calculation 5  Recall 3  Language- name 2 objects 2  Language- repeat 1  Language- follow 3 step command 3  Language- read & follow direction 1  Write a sentence 1  Copy design 1  Total score 30     Recent Results (from the past 2160 hour(s))  CMP     Status: Abnormal   Collection Time: 05/27/15  8:26 AM  Result Value Ref Range   Glucose 95 65 - 99  mg/dL   BUN 14 8 - 27 mg/dL   Creatinine, Ser 0.86 0.57 - 1.00 mg/dL   GFR calc non Af Amer 68 >59 mL/min/1.73   GFR calc Af Amer 79 >59 mL/min/1.73   BUN/Creatinine Ratio 16 11 - 26   Sodium 144 134 - 144 mmol/L    Comment: **Effective June 08, 2015 the reference interval**   for Sodium, Serum will be changing to:                                             136 - 144    Potassium 4.4 3.5 - 5.2 mmol/L    Comment: **Effective June 08, 2015 the reference interval**   for Potassium, Serum will be changing to:                          0 -  7 days        3.7 - 5.2                          8 - 30 days        3.7 - 6.4                          1 -  6 months      3.8 - 6.0                   7 months -  1 year        3.8 - 5.3                              >1 year        3.5 - 5.2    Chloride 104 97 - 108 mmol/L    Comment: **Effective June 08, 2015 the reference interval**   for Chloride, Serum will be changing to:                                              97 - 106    CO2 25 18 - 29 mmol/L   Calcium 9.3 8.7 - 10.3 mg/dL   Total Protein 7.2 6.0 - 8.5 g/dL   Albumin 4.1 3.5 - 4.8 g/dL   Globulin, Total 3.1 1.5 - 4.5 g/dL   Albumin/Globulin Ratio 1.3 1.1 - 2.5   Bilirubin Total 0.3 0.0 - 1.2 mg/dL   Alkaline Phosphatase 159 (H) 39 - 117 IU/L   AST 22 0 - 40 IU/L   ALT 17 0 - 32 IU/L  Lipid Panel     Status: Abnormal   Collection Time: 05/27/15  8:26 AM   Result Value Ref Range   Cholesterol, Total 177 100 - 199 mg/dL   Triglycerides 55 0 - 149 mg/dL   HDL 66 >39 mg/dL    Comment: According to ATP-III Guidelines, HDL-C >59 mg/dL is considered a negative risk factor for CHD.    VLDL Cholesterol Cal 11 5 - 40 mg/dL   LDL Calculated 100 (H) 0 - 99 mg/dL   Chol/HDL Ratio 2.7 0.0 - 4.4 ratio units  Comment:                                   T. Chol/HDL Ratio                                             Men  Women                               1/2 Avg.Risk  3.4    3.3                                   Avg.Risk  5.0    4.4                                2X Avg.Risk  9.6    7.1                                3X Avg.Risk 23.4   11.0   TSH     Status: None   Collection Time: 05/27/15  8:26 AM  Result Value Ref Range   TSH 1.280 0.450 - 4.500 uIU/mL  CBC with Differential     Status: Abnormal   Collection Time: 05/27/15  8:26 AM  Result Value Ref Range   WBC 5.3 3.4 - 10.8 x10E3/uL   RBC 4.48 3.77 - 5.28 x10E6/uL   Hemoglobin 11.7 11.1 - 15.9 g/dL   Hematocrit 36.1 34.0 - 46.6 %   MCV 81 79 - 97 fL   MCH 26.1 (L) 26.6 - 33.0 pg   MCHC 32.4 31.5 - 35.7 g/dL   RDW 15.4 12.3 - 15.4 %   Platelets 279 150 - 379 x10E3/uL   Neutrophils 46 %   Lymphs 40 %   Monocytes 9 %   Eos 4 %   Basos 1 %   Neutrophils Absolute 2.5 1.4 - 7.0 x10E3/uL   Lymphocytes Absolute 2.1 0.7 - 3.1 x10E3/uL   Monocytes Absolute 0.5 0.1 - 0.9 x10E3/uL   EOS (ABSOLUTE) 0.2 0.0 - 0.4 x10E3/uL   Basophils Absolute 0.0 0.0 - 0.2 x10E3/uL   Immature Granulocytes 0 %   Immature Grans (Abs) 0.0 0.0 - 0.1 x10E3/uL  Urinalysis with Reflex Microscopic     Status: Abnormal   Collection Time: 05/27/15  8:44 AM  Result Value Ref Range   Specific Gravity, UA 1.010 1.005 - 1.030   pH, UA 7.0 5.0 - 7.5   Color, UA Yellow Yellow   Appearance Ur Cloudy (A) Clear   Leukocytes, UA 3+ (A) Negative   Protein, UA Negative Negative/Trace   Glucose, UA Negative Negative    Ketones, UA Negative Negative   RBC, UA Negative Negative   Bilirubin, UA Negative Negative   Urobilinogen, Ur 1.0 0.2 - 1.0 mg/dL   Nitrite, UA Negative Negative   Microscopic Examination See below:     Comment: Microscopic was indicated and was performed.  Microscopic Examination     Status: Abnormal   Collection Time: 05/27/15  8:44 AM  Result Value Ref Range  WBC, UA 11-30 (A) 0 -  5 /hpf    Comment: Clumps of leukocytes present.   RBC, UA 0-2 0 -  2 /hpf   Epithelial Cells (non renal) 0-10 0 - 10 /hpf   Casts None seen None seen /lpf   Mucus, UA Present Not Estab.   Bacteria, UA Few None seen/Few    ECG REVIEWED BY MYSELF:  NSR @ 84 bpm, LAD, poor R wave progression. No acute ischemic changes. No other ECG available to compare  Assessment:    Healthy female exam.       ICD-9-CM ICD-10-CM   1. Well adult exam V70.0 Z00.00   2. Essential hypertension 401.9 I10   3. Asthma, mild intermittent, uncomplicated 233.43 H68.61   4. Other allergic rhinitis 477.8 J30.89   5. Gastroesophageal reflux disease, esophagitis presence not specified 530.81 K21.9   6. Hyperlipidemia LDL goal <130 272.4 E78.5   7. Hypokalemia 276.8 E87.6   8. Alkaline phosphatase elevation 790.5 R74.8   9. Osteopenia 733.90 M85.80   10. Morbid obesity with BMI of 40.0-44.9, adult ([place]) 278.01 E66.01    V85.41 Z68.41   11. Cerumen impaction, right 380.4 H61.21   12. Cough 786.2 R05 benzonatate (TESSALON) 200 MG capsule  13. LPRD (laryngopharyngeal reflux disease) - mx by ENT Dr Rowe Clack 478.79 J38.7 benzonatate (TESSALON) 200 MG capsule  14. Dysphonia - mx by ENT 784.42 R49.0   15. Breast cancer screening V76.10 Z12.39 MM DIGITAL SCREENING BILATERAL  16. Encounter for immunization Z23 Z23     Plan:     See After Visit Summary for Counseling Recommendations    Pt is UTD on health maintenance. Vaccinations are UTD. Pt maintains a healthy lifestyle. Encouraged pt to exercise 30-45 minutes 4-5 times per  week. Eat a well balanced diet. Avoid smoking. Limit alcohol intake. Wear seatbelt when riding in the car. Wear sun block (SPF >50) when spending extended times outside.  Continue TED stockings daily  Cont current meds as ordered  F/u with specialists as scheduled  Start tessalon perles prn cough  Right ear lavaged and using forceps, large amt cerumen removed. TM intact, nonbulging and no redness.  Follow up in 3 mos for routine visit  Prathik Aman S. Perlie Gold  [facility] [address] [place], [place] [address] [phone] Cell (Monday-Friday 8 AM - 5 PM) [phone] After 5 PM and follow prompts

## 2015-05-29 NOTE — Patient Instructions (Addendum)
Encouraged pt to exercise 30-45 minutes 4-5 times per week. Eat a well balanced diet. Avoid smoking. Limit alcohol intake. Wear seatbelt when riding in the car. Wear sun block (SPF >50) when spending extended times outside.  Will call with mammogram appt  cologuard for colon cancer screening  Follow up in 3 mos for routine visit  Continue current medications as ordered  Flu shot given today

## 2015-06-02 ENCOUNTER — Telehealth: Payer: Self-pay | Admitting: Pulmonary Disease

## 2015-06-02 NOTE — Telephone Encounter (Signed)
Attempted to contact patient, line busy.  WCB

## 2015-06-03 NOTE — Telephone Encounter (Signed)
Spoke with pt, requesting refill on Tussionex.  Pt wishes to pick this up in office. Last refill: 04/13/15 #198mL, to take 59mL q12h prn cough.  Dr. [name] please advise if you're ok with this refill. Thanks!

## 2015-06-03 NOTE — Telephone Encounter (Signed)
[phone], pt cb

## 2015-06-04 MED ORDER — HYDROCOD POLST-CPM POLST ER 10-8 MG/5ML PO SUER: 5.0000 mL | Freq: Two times a day (BID) | ORAL | Status: DC | PRN

## 2015-06-04 NOTE — Telephone Encounter (Signed)
Pt calling back to check on status of refill request and can be reach @ [phone].Hillery Hunter

## 2015-06-04 NOTE — Telephone Encounter (Signed)
I called made pt aware we are awaiting response from SN.

## 2015-06-04 NOTE — Telephone Encounter (Signed)
Per SN- ok to refill  Rx printed and given to Apolonio Schneiders to have SN sign  Will forward to RA to f/u on

## 2015-06-04 NOTE — Telephone Encounter (Signed)
SN signed rx Pt notified by voicemail that rx is ready to be picked up  Rx placed up front with pt's name on it  Nothing further is needed

## 2015-06-17 ENCOUNTER — Encounter: Payer: Self-pay | Admitting: Podiatry

## 2015-06-17 ENCOUNTER — Ambulatory Visit (INDEPENDENT_AMBULATORY_CARE_PROVIDER_SITE_OTHER): Payer: PPO | Admitting: Podiatry

## 2015-06-17 DIAGNOSIS — B351 Tinea unguium: Secondary | ICD-10-CM | POA: Diagnosis not present

## 2015-06-17 DIAGNOSIS — M79676 Pain in unspecified toe(s): Secondary | ICD-10-CM

## 2015-06-17 NOTE — Progress Notes (Signed)
Patient ID: Wendy Figueroa, female   DOB: [birth year], 69 y.o.   MRN: [id number]  Subjective: This patient presents for scheduled visit complaining of painful toenails walking wearing shoes and requests debridement of toenails  Objective: Orientated 3 No open skin lesions bilaterally Small keratoses dorsal fourth and fifth right toes The toenails are elongated, brittle, incurvated, discolored, hypertrophic and tender to direct palpation 6-10 HAV deformities bilaterally Hammertoe second bilaterally  Assessment: Symptomatic onychomycoses 6-10  Plan: Debridement toenails 10 mechanical electrically without any bleeding Debrided keratoses fourth fifth right toes 2 without a bleeding  Reappoint 3 months

## 2015-06-22 ENCOUNTER — Other Ambulatory Visit: Payer: Self-pay | Admitting: Internal Medicine

## 2015-06-30 ENCOUNTER — Other Ambulatory Visit: Payer: Self-pay | Admitting: Internal Medicine

## 2015-07-01 ENCOUNTER — Other Ambulatory Visit: Payer: Self-pay

## 2015-07-01 MED ORDER — PRAVASTATIN SODIUM 40 MG PO TABS: 40.0000 mg | ORAL_TABLET | Freq: Every day | ORAL | Status: DC

## 2015-07-02 LAB — HM MAMMOGRAPHY

## 2015-07-03 ENCOUNTER — Encounter: Payer: Self-pay | Admitting: *Deleted

## 2015-07-21 ENCOUNTER — Other Ambulatory Visit: Payer: Self-pay | Admitting: Internal Medicine

## 2015-07-27 ENCOUNTER — Encounter: Payer: Self-pay | Admitting: Pulmonary Disease

## 2015-08-04 ENCOUNTER — Telehealth: Payer: Self-pay | Admitting: Pulmonary Disease

## 2015-08-04 MED ORDER — HYDROCOD POLST-CPM POLST ER 10-8 MG/5ML PO SUER: 5.0000 mL | Freq: Two times a day (BID) | ORAL | Status: DC | PRN

## 2015-08-04 NOTE — Telephone Encounter (Signed)
Patient still coughing, would like refill on Tussionex.  Patient would like to pick up RX today.  Last refilled: 06/04/15  Dr. [name], ok to refill?  Current Outpatient Prescriptions on File Prior to Visit  Medication Sig Dispense Refill  . albuterol (PROVENTIL HFA;VENTOLIN HFA) 108 (90 BASE) MCG/ACT inhaler Inhale 2 puffs into the lungs every 6 (six) hours as needed.      . benzonatate (TESSALON) 200 MG capsule Take 1 capsule (200 mg total) by mouth 3 (three) times daily as needed for cough. 60 capsule 2  . budesonide-formoterol (SYMBICORT) 160-4.5 MCG/ACT inhaler Inhale 2 puffs into the lungs 2 (two) times daily.      . Calcium Carb-Cholecalciferol (CALCIUM 600 + D PO) Take by mouth. Take one tablet daily    . chlorpheniramine-HYDROcodone (TUSSIONEX PENNKINETIC ER) 10-8 MG/5ML SUER Take 5 mLs by mouth every 12 (twelve) hours as needed for cough. 120 mL 0  . fluticasone (FLONASE) 50 MCG/ACT nasal spray 2 sprays daily.  3  . furosemide (LASIX) 40 MG tablet Take 1 tablet (40 mg total) by mouth daily. 90 tablet 1  . hydrOXYzine (ATARAX/VISTARIL) 25 MG tablet TAKE 1 TABLET (25 MG TOTAL) BY MOUTH EVERY 6 (SIX) HOURS AS NEEDED FOR ITCHING. 50 tablet 1  . KLOR-CON M20 20 MEQ tablet TAKE 2 TABLETS BY MOUTH DAILY FOR POTASSIUM SUPPLEMENT 180 tablet 1  . levocetirizine (XYZAL) 5 MG tablet Take 5 mg by mouth every evening.      Marland Kitchen losartan (COZAAR) 25 MG tablet TAKE ONE TABLET BY MOUTH ONCE DAILY FOR BLOOD PRESSURE 30 tablet 3  . losartan (COZAAR) 50 MG tablet TAKE ONE TABLET BY MOUTH ONCE DAILY FOR BLOOD PRESSURE 30 tablet 3  . montelukast (SINGULAIR) 10 MG tablet Take 10 mg by mouth at bedtime.      . Multiple Vitamins-Minerals (MACULAR VITAMIN BENEFIT PO) Take 1 capsule by mouth 2 (two) times daily.      Marland Kitchen NIFEdipine (PROCARDIA XL/ADALAT-CC) 60 MG 24 hr tablet TAKE 1 TABLET (60 MG TOTAL) BY MOUTH DAILY. 90 tablet 1  . NON FORMULARY Allergy shots once weekly    . Omega-3 Fatty Acids (FISH OIL PO) Take  by mouth daily.    . pantoprazole (PROTONIX) 40 MG tablet TAKE ONE TABLET BY MOUTH ONCE DAILY FOR STOMACH (Patient taking differently: Take 1 tablet by mouth at breakfast and 1 tablet by mouth at dinner) 30 tablet 3  . pravastatin (PRAVACHOL) 40 MG tablet Take 1 tablet (40 mg total) by mouth daily. 90 tablet 1  . ranitidine (ZANTAC) 300 MG tablet Take 300 mg by mouth at bedtime.    Marland Kitchen SPIRIVA HANDIHALER 18 MCG inhalation capsule 18 mcg 2 (two) times daily.  6   No current facility-administered medications on file prior to visit.   Allergies  Allergen Reactions  . Demerol Hives    All over the body   . Lisinopril-Hydrochlorothiazide Hives    All over the body  . Penicillins Hives    All over the body

## 2015-08-04 NOTE — Telephone Encounter (Signed)
Per SN>> Ok to refill as previously rx'd. Notify pt of refill  Order printed and signed by SN and placed up front Pt called and notified of refill Nothing further is needed at this time.

## 2015-08-06 ENCOUNTER — Other Ambulatory Visit: Payer: Self-pay | Admitting: Internal Medicine

## 2015-08-27 DIAGNOSIS — J3081 Allergic rhinitis due to animal (cat) (dog) hair and dander: Secondary | ICD-10-CM | POA: Diagnosis not present

## 2015-08-27 DIAGNOSIS — J301 Allergic rhinitis due to pollen: Secondary | ICD-10-CM | POA: Diagnosis not present

## 2015-08-27 DIAGNOSIS — J3089 Other allergic rhinitis: Secondary | ICD-10-CM | POA: Diagnosis not present

## 2015-08-28 DIAGNOSIS — J3081 Allergic rhinitis due to animal (cat) (dog) hair and dander: Secondary | ICD-10-CM | POA: Diagnosis not present

## 2015-08-28 DIAGNOSIS — J3089 Other allergic rhinitis: Secondary | ICD-10-CM | POA: Diagnosis not present

## 2015-08-28 DIAGNOSIS — J301 Allergic rhinitis due to pollen: Secondary | ICD-10-CM | POA: Diagnosis not present

## 2015-09-02 ENCOUNTER — Ambulatory Visit (INDEPENDENT_AMBULATORY_CARE_PROVIDER_SITE_OTHER): Payer: PPO | Admitting: Internal Medicine

## 2015-09-02 ENCOUNTER — Encounter: Payer: Self-pay | Admitting: Internal Medicine

## 2015-09-02 VITALS — BP 138/80 | HR 98 | Temp 98.2°F | Resp 20 | Ht 62.0 in | Wt 228.8 lb

## 2015-09-02 DIAGNOSIS — J3089 Other allergic rhinitis: Secondary | ICD-10-CM | POA: Diagnosis not present

## 2015-09-02 DIAGNOSIS — R053 Chronic cough: Secondary | ICD-10-CM

## 2015-09-02 DIAGNOSIS — J387 Other diseases of larynx: Secondary | ICD-10-CM

## 2015-09-02 DIAGNOSIS — E785 Hyperlipidemia, unspecified: Secondary | ICD-10-CM | POA: Diagnosis not present

## 2015-09-02 DIAGNOSIS — I10 Essential (primary) hypertension: Secondary | ICD-10-CM | POA: Diagnosis not present

## 2015-09-02 DIAGNOSIS — J4520 Mild intermittent asthma, uncomplicated: Secondary | ICD-10-CM

## 2015-09-02 DIAGNOSIS — M25562 Pain in left knee: Secondary | ICD-10-CM

## 2015-09-02 DIAGNOSIS — E876 Hypokalemia: Secondary | ICD-10-CM | POA: Diagnosis not present

## 2015-09-02 DIAGNOSIS — K219 Gastro-esophageal reflux disease without esophagitis: Secondary | ICD-10-CM

## 2015-09-02 DIAGNOSIS — R05 Cough: Secondary | ICD-10-CM

## 2015-09-02 DIAGNOSIS — G8929 Other chronic pain: Secondary | ICD-10-CM | POA: Diagnosis not present

## 2015-09-02 MED ORDER — HYDROCOD POLST-CPM POLST ER 10-8 MG/5ML PO SUER: 5.0000 mL | Freq: Two times a day (BID) | ORAL | Status: DC | PRN

## 2015-09-02 NOTE — Patient Instructions (Addendum)
Recommend squeeze ball for hand exercise  Recommend water aerobic +/- sauna for arthritic joints  Continue current medications as ordered  Follow up with GI (Dr Olevia Perches) to discuss if esophageal dilation needed to help cough  Follow up in 4 mos for routine visit. Fasting labs prior to appt

## 2015-09-02 NOTE — Progress Notes (Signed)
Patient ID: Wendy Figueroa, female   DOB: Oct 08, 1945, 70 y.o.   MRN: [id number]    Location:    [facility]   Place of Service:  OFFICE   Chief Complaint  Patient presents with  . Medical Management of Chronic Issues    3 mo f/u    HPI:  70 yo female seen today for f/u. 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 Since then, she as increased exercise at gym. She continues to have a dry cough.  No relief with tessalon perles. Tussionex works better. She is seeing ENT at [facility]. PPI increased to BID and Rx zantac several mos ago but she continues to cough. She has seen a speech pathologist to see learn different swallowing methods. Pt reports difficulty swallowing water and that she gets strangled when drinking water. She has not seen GI for issue. She was told she may need esophagus stretched. She is thinking about seeing Dr Olevia Perches (GI).  HTN - BP stable on on losartan 75 mg daily, procardia XL and lasix  Asthma - stable on HFA, spiriva, singulair and symbicort. No asthma exacerbations/attacks. She also takes xyzal and flonase for allergic rhinitis. She has noticed ose has crusted blood in Am. She does not use humidifier. She gets allergy shots weekly  Hyperlipidemia - stable; takes pravachol  Arthritis - stable  Past Medical History  Diagnosis Date  . Allergic rhinitis   . Sinusitis   . Asthma   . Hypertension   . Venous insufficiency   . Hypercholesterolemia   . Overweight(278.02)   . GERD (gastroesophageal reflux disease)   . Diverticulosis of colon   . Hx of colonic polyps   . Elevated alkaline phosphatase level   . Sebaceous cyst     on back  . Wheezing     occasional  . Cough     occasional  . Leg swelling     occasional  . H/O urinary incontinence 11/06/2008  . Rectocele 11/06/2008    Large  . Cystocele 11/05/08  . Hard measles   . H/O varicella   . Yeast infection   . Abnormal Pap smear     Over 20yrs ago  . Anemia   . DJD (degenerative joint  disease)     no per pt  . Arthritis   . ALKALINE PHOSPHATASE, ELEVATED 07/01/2009    Qualifier: Diagnosis of  By: [name] MD, [name]   . LPRD (laryngopharyngeal reflux disease) 05/29/2015    Past Surgical History  Procedure Laterality Date  . Apogee / perigee repair  10/2008    Dr. Octavio Manns  . Breast surgery  1978    cyst removed from both removed  . Abdominal hysterectomy  1988  . Bladder tack  2011  . Anterior and posterior repair  11/05/08  . Esophagogastroduodenoscopy    . Colonoscopy      Patient Care Team: [name], DO as PCP - General (Internal Medicine)  Social History   Social History  . Marital Status: Married    Spouse Name: [name]  . Number of Children: 2  . Years of Education: N/A   Occupational History  . LPN     Social History Main Topics  . Smoking status: Never Smoker   . Smokeless tobacco: Never Used  . Alcohol Use: Yes     Comment: social use  . Drug Use: No  . Sexual Activity: Not on file   Other Topics Concern  . Not on  file   Social History Narrative   Married   Never smoked   Alcohol few beers on week-end   Exercise walk 5 days a week for one hour   No Advance Directive      reports that she has never smoked. She has never used smokeless tobacco. She reports that she drinks alcohol. She reports that she does not use illicit drugs.  Allergies  Allergen Reactions  . Demerol Hives    All over the body   . Lisinopril-Hydrochlorothiazide Hives    All over the body  . Penicillins Hives    All over the body    Medications: Patient's Medications  New Prescriptions   No medications on file  Previous Medications   ALBUTEROL (PROVENTIL HFA;VENTOLIN HFA) 108 (90 BASE) MCG/ACT INHALER    Inhale 2 puffs into the lungs every 6 (six) hours as needed.     BENZONATATE (TESSALON) 200 MG CAPSULE    Take 1 capsule (200 mg total) by mouth 3 (three) times daily as needed for cough.   BUDESONIDE-FORMOTEROL (SYMBICORT) 160-4.5 MCG/ACT INHALER     Inhale 2 puffs into the lungs 2 (two) times daily.     CALCIUM CARB-CHOLECALCIFEROL (CALCIUM 600 + D PO)    Take by mouth. Take one tablet daily   CHLORPHENIRAMINE-HYDROCODONE (TUSSIONEX PENNKINETIC ER) 10-8 MG/5ML SUER    Take 5 mLs by mouth every 12 (twelve) hours as needed for cough.   FLUTICASONE (FLONASE) 50 MCG/ACT NASAL SPRAY    2 sprays daily.   FUROSEMIDE (LASIX) 40 MG TABLET    TAKE 1 TABLET (40 MG TOTAL) BY MOUTH DAILY.   HYDROXYZINE (ATARAX/VISTARIL) 25 MG TABLET    TAKE 1 TABLET (25 MG TOTAL) BY MOUTH EVERY 6 (SIX) HOURS AS NEEDED FOR ITCHING.   KLOR-CON M20 20 MEQ TABLET    TAKE 2 TABLETS BY MOUTH DAILY FOR POTASSIUM SUPPLEMENT   LEVOCETIRIZINE (XYZAL) 5 MG TABLET    Take 5 mg by mouth every evening.     LOSARTAN (COZAAR) 25 MG TABLET    TAKE ONE TABLET BY MOUTH ONCE DAILY FOR BLOOD PRESSURE   LOSARTAN (COZAAR) 50 MG TABLET    TAKE ONE TABLET BY MOUTH ONCE DAILY FOR BLOOD PRESSURE   MONTELUKAST (SINGULAIR) 10 MG TABLET    Take 10 mg by mouth at bedtime.     MULTIPLE VITAMINS-MINERALS (MACULAR VITAMIN BENEFIT PO)    Take 1 capsule by mouth 2 (two) times daily.     NIFEDIPINE (PROCARDIA XL/ADALAT-CC) 60 MG 24 HR TABLET    TAKE 1 TABLET (60 MG TOTAL) BY MOUTH DAILY.   NON FORMULARY    Allergy shots once weekly   OMEGA-3 FATTY ACIDS (FISH OIL PO)    Take by mouth daily.   PANTOPRAZOLE (PROTONIX) 40 MG TABLET    TAKE ONE TABLET BY MOUTH ONCE DAILY FOR STOMACH   PRAVASTATIN (PRAVACHOL) 40 MG TABLET    Take 1 tablet (40 mg total) by mouth daily.   RANITIDINE (ZANTAC) 300 MG TABLET    Take 300 mg by mouth at bedtime.   SPIRIVA HANDIHALER 18 MCG INHALATION CAPSULE    18 mcg 2 (two) times daily.  Modified Medications   No medications on file  Discontinued Medications   No medications on file    Review of Systems  Constitutional: Negative for fever, chills, diaphoresis, activity change, appetite change and fatigue.  HENT: Negative for ear pain and sore throat.   Eyes: Negative for  visual disturbance.  Respiratory:  Negative for cough, chest tightness and shortness of breath.   Cardiovascular: Negative for chest pain, palpitations and leg swelling.  Gastrointestinal: Negative for nausea, vomiting, abdominal pain, diarrhea, constipation and blood in stool.  Genitourinary: Negative for dysuria.  Musculoskeletal: Positive for joint swelling, arthralgias and gait problem.  Neurological: Negative for dizziness, tremors, numbness and headaches.  Psychiatric/Behavioral: Negative for sleep disturbance. The patient is not nervous/anxious.     Filed Vitals:   09/02/15 1342  BP: 138/80  Pulse: 98  Temp: 98.2 F (36.8 C)  TempSrc: Oral  Resp: 20  Height: 5\' 2"  (1.575 m)  Weight: 228 lb 12.8 oz (103.783 kg)  SpO2: 95%   Body mass index is 41.84 kg/(m^2).  Physical Exam  Constitutional: She is oriented to person, place, and time. She appears well-developed and well-nourished. No distress (in NAD).  HENT:  Mouth/Throat: Oropharynx is clear and moist. No oropharyngeal exudate.  Eyes: Pupils are equal, round, and reactive to light. No scleral icterus.  Neck: Neck supple. Carotid bruit is not present. No tracheal deviation present. No thyromegaly present.  Cardiovascular: Normal rate, regular rhythm, normal heart sounds and intact distal pulses.  Exam reveals no gallop and no friction rub.   No murmur heard. No LE edema b/l. no calf TTP.   Pulmonary/Chest: Effort normal and breath sounds normal. No stridor. No respiratory distress. She has no wheezes. She has no rales.  Abdominal: Soft. Bowel sounds are normal. She exhibits no distension and no mass. There is no hepatomegaly. There is no tenderness. There is no rebound and no guarding.  Genitourinary: Vagina normal.  Musculoskeletal: She exhibits edema and tenderness.  L>R knee swelling with TTP and reduced flexion  Lymphadenopathy:    She has no cervical adenopathy.  Neurological: She is alert and oriented to person,  place, and time.  Gait antalgic  Skin: Skin is warm and dry. No rash noted.  Psychiatric: She has a normal mood and affect. Her behavior is normal. Judgment and thought content normal.     Labs reviewed: Abstract on 07/03/2015  Component Date Value Ref Range Status  . HM Mammogram 07/02/2015 [facility]: no mammographic evidence of malignancy    Final    No results found.   Assessment/Plan   ICD-9-CM ICD-10-CM   1. Chronic cough due to #2, 3 and 4 786.2 R05   2. LPRD (laryngopharyngeal reflux disease) - stable 478.79 J38.7   3. Asthma, mild intermittent, uncomplicated 123456 [id number]   4. Gastroesophageal reflux disease, esophagitis presence not specified 530.81 K21.9   5. Other allergic rhinitis 477.8 J30.89   6. Essential hypertension - stable 401.9 I10   7. Hyperlipidemia LDL goal <130  stable 272.4 E78.5   8. Hypokalemia - stable 276.8 E87.6   9. Knee pain, chronic, left - stable 719.46 M25.562    338.29 G89.29     Recommend squeeze ball for hand exercise  Recommend water aerobic +/- sauna for arthritic joints  Continue current medications as ordered  Follow up with GI (Dr Olevia Perches) to discuss if esophageal dilation needed to help cough  Follow up in 4 mos for routine visit. Fasting labs prior to appt  [place] S. Perlie Gold  [facility] [address] [place], [place] [address] [phone] Cell (Monday-Friday 8 AM - 5 PM) [phone] After 5 PM and follow prompts

## 2015-09-07 DIAGNOSIS — H3509 Other intraretinal microvascular abnormalities: Secondary | ICD-10-CM | POA: Diagnosis not present

## 2015-09-07 DIAGNOSIS — H35373 Puckering of macula, bilateral: Secondary | ICD-10-CM | POA: Diagnosis not present

## 2015-09-07 DIAGNOSIS — H3562 Retinal hemorrhage, left eye: Secondary | ICD-10-CM | POA: Diagnosis not present

## 2015-09-07 DIAGNOSIS — Z961 Presence of intraocular lens: Secondary | ICD-10-CM | POA: Diagnosis not present

## 2015-09-07 DIAGNOSIS — H353122 Nonexudative age-related macular degeneration, left eye, intermediate dry stage: Secondary | ICD-10-CM | POA: Diagnosis not present

## 2015-09-07 DIAGNOSIS — H353112 Nonexudative age-related macular degeneration, right eye, intermediate dry stage: Secondary | ICD-10-CM | POA: Diagnosis not present

## 2015-09-11 DIAGNOSIS — J3081 Allergic rhinitis due to animal (cat) (dog) hair and dander: Secondary | ICD-10-CM | POA: Diagnosis not present

## 2015-09-11 DIAGNOSIS — J3089 Other allergic rhinitis: Secondary | ICD-10-CM | POA: Diagnosis not present

## 2015-09-11 DIAGNOSIS — J301 Allergic rhinitis due to pollen: Secondary | ICD-10-CM | POA: Diagnosis not present

## 2015-09-23 ENCOUNTER — Ambulatory Visit (INDEPENDENT_AMBULATORY_CARE_PROVIDER_SITE_OTHER): Payer: PPO | Admitting: Podiatry

## 2015-09-23 DIAGNOSIS — B351 Tinea unguium: Secondary | ICD-10-CM | POA: Diagnosis not present

## 2015-09-23 DIAGNOSIS — M79676 Pain in unspecified toe(s): Secondary | ICD-10-CM | POA: Diagnosis not present

## 2015-09-23 NOTE — Progress Notes (Signed)
Patient ID: Wendy Figueroa, female   DOB: [birth year], 70 y.o.   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

## 2015-09-25 DIAGNOSIS — J301 Allergic rhinitis due to pollen: Secondary | ICD-10-CM | POA: Diagnosis not present

## 2015-09-25 DIAGNOSIS — J3089 Other allergic rhinitis: Secondary | ICD-10-CM | POA: Diagnosis not present

## 2015-09-25 DIAGNOSIS — J3081 Allergic rhinitis due to animal (cat) (dog) hair and dander: Secondary | ICD-10-CM | POA: Diagnosis not present

## 2015-10-12 DIAGNOSIS — J3081 Allergic rhinitis due to animal (cat) (dog) hair and dander: Secondary | ICD-10-CM | POA: Diagnosis not present

## 2015-10-12 DIAGNOSIS — J301 Allergic rhinitis due to pollen: Secondary | ICD-10-CM | POA: Diagnosis not present

## 2015-10-12 DIAGNOSIS — J3089 Other allergic rhinitis: Secondary | ICD-10-CM | POA: Diagnosis not present

## 2015-10-16 DIAGNOSIS — J3081 Allergic rhinitis due to animal (cat) (dog) hair and dander: Secondary | ICD-10-CM | POA: Diagnosis not present

## 2015-10-16 DIAGNOSIS — J3089 Other allergic rhinitis: Secondary | ICD-10-CM | POA: Diagnosis not present

## 2015-10-16 DIAGNOSIS — J301 Allergic rhinitis due to pollen: Secondary | ICD-10-CM | POA: Diagnosis not present

## 2015-10-23 DIAGNOSIS — J3081 Allergic rhinitis due to animal (cat) (dog) hair and dander: Secondary | ICD-10-CM | POA: Diagnosis not present

## 2015-10-23 DIAGNOSIS — J301 Allergic rhinitis due to pollen: Secondary | ICD-10-CM | POA: Diagnosis not present

## 2015-10-23 DIAGNOSIS — J3089 Other allergic rhinitis: Secondary | ICD-10-CM | POA: Diagnosis not present

## 2015-10-30 DIAGNOSIS — J3089 Other allergic rhinitis: Secondary | ICD-10-CM | POA: Diagnosis not present

## 2015-10-30 DIAGNOSIS — J3081 Allergic rhinitis due to animal (cat) (dog) hair and dander: Secondary | ICD-10-CM | POA: Diagnosis not present

## 2015-10-30 DIAGNOSIS — J301 Allergic rhinitis due to pollen: Secondary | ICD-10-CM | POA: Diagnosis not present

## 2015-11-06 DIAGNOSIS — J301 Allergic rhinitis due to pollen: Secondary | ICD-10-CM | POA: Diagnosis not present

## 2015-11-06 DIAGNOSIS — J3089 Other allergic rhinitis: Secondary | ICD-10-CM | POA: Diagnosis not present

## 2015-11-06 DIAGNOSIS — J3081 Allergic rhinitis due to animal (cat) (dog) hair and dander: Secondary | ICD-10-CM | POA: Diagnosis not present

## 2015-11-13 DIAGNOSIS — J3081 Allergic rhinitis due to animal (cat) (dog) hair and dander: Secondary | ICD-10-CM | POA: Diagnosis not present

## 2015-11-13 DIAGNOSIS — J3089 Other allergic rhinitis: Secondary | ICD-10-CM | POA: Diagnosis not present

## 2015-11-13 DIAGNOSIS — J301 Allergic rhinitis due to pollen: Secondary | ICD-10-CM | POA: Diagnosis not present

## 2015-11-18 ENCOUNTER — Other Ambulatory Visit: Payer: Self-pay | Admitting: Internal Medicine

## 2015-11-20 ENCOUNTER — Telehealth: Payer: Self-pay | Admitting: *Deleted

## 2015-11-20 DIAGNOSIS — J3089 Other allergic rhinitis: Secondary | ICD-10-CM | POA: Diagnosis not present

## 2015-11-20 DIAGNOSIS — J3081 Allergic rhinitis due to animal (cat) (dog) hair and dander: Secondary | ICD-10-CM | POA: Diagnosis not present

## 2015-11-20 DIAGNOSIS — J301 Allergic rhinitis due to pollen: Secondary | ICD-10-CM | POA: Diagnosis not present

## 2015-11-20 MED ORDER — HYDROCOD POLST-CPM POLST ER 10-8 MG/5ML PO SUER: 5.0000 mL | Freq: Two times a day (BID) | ORAL | Status: DC | PRN

## 2015-11-20 NOTE — Telephone Encounter (Signed)
Patient notified and will pick up Rx

## 2015-11-20 NOTE — Telephone Encounter (Signed)
Patient called requesting a refill on her Tussionex. Is this ok. Please Advise.

## 2015-11-20 NOTE — Telephone Encounter (Signed)
Ok to [employer] [id number]

## 2015-11-20 NOTE — Addendum Note (Signed)
Addended by: Rafael Bihari A on: 11/20/2015 12:49 PM   Modules accepted: Orders

## 2015-12-07 ENCOUNTER — Other Ambulatory Visit: Payer: Self-pay | Admitting: Internal Medicine

## 2015-12-08 DIAGNOSIS — J3081 Allergic rhinitis due to animal (cat) (dog) hair and dander: Secondary | ICD-10-CM | POA: Diagnosis not present

## 2015-12-08 DIAGNOSIS — J3089 Other allergic rhinitis: Secondary | ICD-10-CM | POA: Diagnosis not present

## 2015-12-08 DIAGNOSIS — J301 Allergic rhinitis due to pollen: Secondary | ICD-10-CM | POA: Diagnosis not present

## 2015-12-21 DIAGNOSIS — R3915 Urgency of urination: Secondary | ICD-10-CM | POA: Diagnosis not present

## 2015-12-21 DIAGNOSIS — R159 Full incontinence of feces: Secondary | ICD-10-CM | POA: Diagnosis not present

## 2015-12-21 DIAGNOSIS — Z6839 Body mass index (BMI) 39.0-39.9, adult: Secondary | ICD-10-CM | POA: Diagnosis not present

## 2015-12-21 DIAGNOSIS — R350 Frequency of micturition: Secondary | ICD-10-CM | POA: Diagnosis not present

## 2015-12-21 DIAGNOSIS — Z01419 Encounter for gynecological examination (general) (routine) without abnormal findings: Secondary | ICD-10-CM | POA: Diagnosis not present

## 2015-12-22 ENCOUNTER — Encounter: Payer: Self-pay | Admitting: Podiatry

## 2015-12-22 ENCOUNTER — Other Ambulatory Visit: Payer: Self-pay | Admitting: Internal Medicine

## 2015-12-22 ENCOUNTER — Ambulatory Visit (INDEPENDENT_AMBULATORY_CARE_PROVIDER_SITE_OTHER): Payer: PPO | Admitting: Podiatry

## 2015-12-22 DIAGNOSIS — M79676 Pain in unspecified toe(s): Secondary | ICD-10-CM | POA: Diagnosis not present

## 2015-12-22 DIAGNOSIS — B351 Tinea unguium: Secondary | ICD-10-CM | POA: Diagnosis not present

## 2015-12-22 NOTE — Progress Notes (Signed)
Patient ID: Wendy Figueroa, female   DOB: 1946/08/08, 70 y.o.   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

## 2015-12-25 DIAGNOSIS — J3089 Other allergic rhinitis: Secondary | ICD-10-CM | POA: Diagnosis not present

## 2015-12-25 DIAGNOSIS — J3081 Allergic rhinitis due to animal (cat) (dog) hair and dander: Secondary | ICD-10-CM | POA: Diagnosis not present

## 2015-12-25 DIAGNOSIS — J301 Allergic rhinitis due to pollen: Secondary | ICD-10-CM | POA: Diagnosis not present

## 2015-12-30 ENCOUNTER — Other Ambulatory Visit: Payer: PPO

## 2015-12-30 DIAGNOSIS — I10 Essential (primary) hypertension: Secondary | ICD-10-CM

## 2015-12-30 DIAGNOSIS — E876 Hypokalemia: Secondary | ICD-10-CM | POA: Diagnosis not present

## 2015-12-30 DIAGNOSIS — E785 Hyperlipidemia, unspecified: Secondary | ICD-10-CM | POA: Diagnosis not present

## 2015-12-30 NOTE — Addendum Note (Signed)
Addended by: Leonie Green on: 12/30/2015 08:52 AM   Modules accepted: Orders

## 2015-12-30 NOTE — Addendum Note (Signed)
Addended by: Denyse Amass on: 12/30/2015 08:24 AM   Modules accepted: Orders

## 2015-12-31 LAB — COMPREHENSIVE METABOLIC PANEL
ALBUMIN: 4.2 g/dL (ref 3.5–4.8)
ALK PHOS: 165 IU/L — AB (ref 39–117)
ALT: 19 IU/L (ref 0–32)
AST: 25 IU/L (ref 0–40)
Albumin/Globulin Ratio: 1.4 (ref 1.2–2.2)
BILIRUBIN TOTAL: 0.4 mg/dL (ref 0.0–1.2)
BUN / CREAT RATIO: 13 (ref 12–28)
BUN: 11 mg/dL (ref 8–27)
CO2: 23 mmol/L (ref 18–29)
CREATININE: 0.83 mg/dL (ref 0.57–1.00)
Calcium: 9.6 mg/dL (ref 8.7–10.3)
Chloride: 103 mmol/L (ref 96–106)
GFR calc Af Amer: 81 mL/min/{1.73_m2} (ref >59)
GFR calc non Af Amer: 71 mL/min/{1.73_m2} (ref >59)
GLOBULIN, TOTAL: 3 g/dL (ref 1.5–4.5)
Glucose: 90 mg/dL (ref 65–99)
POTASSIUM: 4.3 mmol/L (ref 3.5–5.2)
SODIUM: 141 mmol/L (ref 134–144)
Total Protein: 7.2 g/dL (ref 6.0–8.5)

## 2015-12-31 LAB — LIPID PANEL
CHOLESTEROL TOTAL: 169 mg/dL (ref 100–199)
Chol/HDL Ratio: 2.8 ratio units (ref 0.0–4.4)
HDL: 61 mg/dL (ref >39)
LDL CALC: 96 mg/dL (ref 0–99)
TRIGLYCERIDES: 60 mg/dL (ref 0–149)
VLDL Cholesterol Cal: 12 mg/dL (ref 5–40)

## 2016-01-01 ENCOUNTER — Encounter: Payer: Self-pay | Admitting: Internal Medicine

## 2016-01-01 ENCOUNTER — Ambulatory Visit (INDEPENDENT_AMBULATORY_CARE_PROVIDER_SITE_OTHER): Payer: PPO | Admitting: Internal Medicine

## 2016-01-01 VITALS — BP 136/80 | HR 95 | Temp 98.3°F | Resp 20 | Ht 62.0 in | Wt 235.0 lb

## 2016-01-01 DIAGNOSIS — I10 Essential (primary) hypertension: Secondary | ICD-10-CM

## 2016-01-01 DIAGNOSIS — R05 Cough: Secondary | ICD-10-CM

## 2016-01-01 DIAGNOSIS — K219 Gastro-esophageal reflux disease without esophagitis: Secondary | ICD-10-CM | POA: Diagnosis not present

## 2016-01-01 DIAGNOSIS — J387 Other diseases of larynx: Secondary | ICD-10-CM | POA: Diagnosis not present

## 2016-01-01 DIAGNOSIS — J4520 Mild intermittent asthma, uncomplicated: Secondary | ICD-10-CM

## 2016-01-01 DIAGNOSIS — E785 Hyperlipidemia, unspecified: Secondary | ICD-10-CM

## 2016-01-01 DIAGNOSIS — R053 Chronic cough: Secondary | ICD-10-CM

## 2016-01-01 DIAGNOSIS — J3089 Other allergic rhinitis: Secondary | ICD-10-CM

## 2016-01-01 MED ORDER — HYDROCOD POLST-CPM POLST ER 10-8 MG/5ML PO SUER: 5.0000 mL | Freq: Two times a day (BID) | ORAL | Status: DC | PRN

## 2016-01-01 NOTE — Progress Notes (Signed)
Patient ID: Wendy Figueroa, female   DOB: Mar 11, 1946, 70 y.o.   MRN: [id number]    Location:    [facility]   Place of Service:  OFFICE   Chief Complaint  Patient presents with  . 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 Since then, she as increased exercise at gym. She continues to have a dry cough.  No relief with tessalon perles. Tussionex works better. She is seeing ENT at [facility]. PPI increased to BID and Rx zantac several mos ago but she continues to cough. She has seen a speech pathologist to see learn different swallowing methods. Pt reports difficulty swallowing water and that she gets strangled when drinking water. She has not seen GI for issue. She was told she may need esophagus stretched. She has an appt with GI Dr Rosana Hoes on June 12th. Cough started prior to ARB Rx and is not any worse  HTN - BP stable on on losartan 75 mg daily, procardia XL and lasix  Asthma - stable on HFA, spiriva, singulair and symbicort. No asthma exacerbations/attacks. She also takes xyzal and flonase for allergic rhinitis. She has noticed ose has crusted blood in Am. She does not use humidifier. She gets allergy shots weekly. Followed by pulmonary  Hyperlipidemia - stable; takes pravachol  Arthritis - stable  Past Medical History  Diagnosis Date  . Allergic rhinitis   . Sinusitis   . Asthma   . Hypertension   . Venous insufficiency   . Hypercholesterolemia   . Overweight(278.02)   . GERD (gastroesophageal reflux disease)   . Diverticulosis of colon   . Hx of colonic polyps   . Elevated alkaline phosphatase level   . Sebaceous cyst     on back  . Wheezing     occasional  . Cough     occasional  . Leg swelling     occasional  . H/O urinary incontinence 11/06/2008  . Rectocele 11/06/2008    Large  . Cystocele 11/05/08  . Hard measles   . H/O varicella   . Yeast infection   . Abnormal Pap smear      Over 22yrs ago  . Anemia   . DJD (degenerative joint disease)     no per pt  . Arthritis   . ALKALINE PHOSPHATASE, ELEVATED 07/01/2009    Qualifier: Diagnosis of  By: [name] MD, [name]   . LPRD (laryngopharyngeal reflux disease) 05/29/2015    Past Surgical History  Procedure Laterality Date  . Apogee / perigee repair  10/2008    Dr. Octavio Manns  . Breast surgery  1978    cyst removed from both removed  . Abdominal hysterectomy  1988  . Bladder tack  2011  . Anterior and posterior repair  11/05/08  . Esophagogastroduodenoscopy    . Colonoscopy      Patient Care Team: [name], DO as PCP - General (Internal Medicine)  Social History   Social History  . Marital Status: Married    Spouse Name: [name]  . Number of Children: 2  . Years of Education: N/A   Occupational History  . LPN     Social History Main Topics  . Smoking status: Never Smoker   . Smokeless tobacco: Never Used  . Alcohol Use: Yes     Comment: social use  . Drug Use: No  . Sexual Activity:  Not on file   Other Topics Concern  . Not on file   Social History Narrative   Married   Never smoked   Alcohol few beers on week-end   Exercise walk 5 days a week for one hour   No Advance Directive      reports that she has never smoked. She has never used smokeless tobacco. She reports that she drinks alcohol. She reports that she does not use illicit drugs.  Allergies  Allergen Reactions  . Demerol Hives    All over the body   . Lisinopril-Hydrochlorothiazide Hives    All over the body  . Penicillins Hives    All over the body    Medications: Patient's Medications  New Prescriptions   No medications on file  Previous Medications   ALBUTEROL (PROVENTIL HFA;VENTOLIN HFA) 108 (90 BASE) MCG/ACT INHALER    Inhale 2 puffs into the lungs every 6 (six) hours as needed.     BUDESONIDE-FORMOTEROL (SYMBICORT) 160-4.5 MCG/ACT INHALER    Inhale 2 puffs into the lungs 2 (two) times daily.      CALCIUM CARB-CHOLECALCIFEROL (CALCIUM 600 + D PO)    Take by mouth. Take one tablet daily   CHLORPHENIRAMINE-HYDROCODONE (TUSSIONEX PENNKINETIC ER) 10-8 MG/5ML SUER    Take 5 mLs by mouth every 12 (twelve) hours as needed for cough.   FLUTICASONE (FLONASE) 50 MCG/ACT NASAL SPRAY    2 sprays daily.   FUROSEMIDE (LASIX) 40 MG TABLET    TAKE 1 TABLET (40 MG TOTAL) BY MOUTH DAILY.   KLOR-CON M20 20 MEQ TABLET    TAKE 2 TABLETS BY MOUTH DAILY FOR POTASSIUM SUPPLEMENT   LEVOCETIRIZINE (XYZAL) 5 MG TABLET    Take 5 mg by mouth every evening.     LOSARTAN (COZAAR) 25 MG TABLET    TAKE 1 TABLET BY MOUTH DAILY FOR BLOOD PRESSURE   LOSARTAN (COZAAR) 50 MG TABLET    TAKE ONE TABLET BY MOUTH ONCE DAILY FOR BLOOD PRESSURE   MONTELUKAST (SINGULAIR) 10 MG TABLET    Take 10 mg by mouth at bedtime.     MULTIPLE VITAMINS-MINERALS (MACULAR VITAMIN BENEFIT PO)    Take 1 capsule by mouth 2 (two) times daily.     NIFEDIPINE (PROCARDIA XL/ADALAT-CC) 60 MG 24 HR TABLET    TAKE 1 TABLET (60 MG TOTAL) BY MOUTH DAILY.   NON FORMULARY    Allergy shots once weekly   OMEGA-3 FATTY ACIDS (FISH OIL PO)    Take by mouth daily.   PANTOPRAZOLE (PROTONIX) 40 MG TABLET    TAKE ONE TABLET BY MOUTH ONCE DAILY FOR STOMACH   PRAVASTATIN (PRAVACHOL) 40 MG TABLET    Take 1 tablet (40 mg total) by mouth daily.   RANITIDINE (ZANTAC) 300 MG TABLET    Take 300 mg by mouth at bedtime.   SPIRIVA HANDIHALER 18 MCG INHALATION CAPSULE    18 mcg 2 (two) times daily.  Modified Medications   No medications on file  Discontinued Medications   BENZONATATE (TESSALON) 200 MG CAPSULE    Take 1 capsule (200 mg total) by mouth 3 (three) times daily as needed for cough.   LOSARTAN (COZAAR) 50 MG TABLET    TAKE ONE TABLET BY MOUTH ONCE DAILY FOR BLOOD PRESSURE    Review of Systems  Constitutional: Negative for fever, chills, diaphoresis, activity change, appetite change and fatigue.  HENT: Negative for ear pain and sore throat.   Eyes: Negative for  visual disturbance.  Respiratory: Positive  for cough. Negative for chest tightness and shortness of breath.   Cardiovascular: Negative for chest pain, palpitations and leg swelling.  Gastrointestinal: Negative for nausea, vomiting, abdominal pain, diarrhea, constipation and blood in stool.  Genitourinary: Negative for dysuria.  Musculoskeletal: Positive for joint swelling, arthralgias and gait problem.  Neurological: Negative for dizziness, tremors, numbness and headaches.  Psychiatric/Behavioral: Positive for sleep disturbance (due to cough). The patient is not nervous/anxious.     Filed Vitals:   01/01/16 1013  BP: 136/80  Pulse: 95  Temp: 98.3 F (36.8 C)  TempSrc: Oral  Resp: 20  Height: 5\' 2"  (1.575 m)  Weight: 235 lb (106.595 kg)  SpO2: 98%   Body mass index is 42.97 kg/(m^2).  Physical Exam  Constitutional: She is oriented to person, place, and time. She appears well-developed and well-nourished. No distress (in NAD).  HENT:  Mouth/Throat: Oropharynx is clear and moist. No oropharyngeal exudate.  Eyes: Pupils are equal, round, and reactive to light. No scleral icterus.  Neck: Neck supple. Carotid bruit is not present. No tracheal deviation present. No thyromegaly present.  Cardiovascular: Normal rate, regular rhythm, normal heart sounds and intact distal pulses.  Exam reveals no gallop and no friction rub.   No murmur heard. No LE edema b/l. no calf TTP.   Pulmonary/Chest: Effort normal and breath sounds normal. No stridor. No respiratory distress. She has no wheezes. She has no rales.  Abdominal: Soft. Bowel sounds are normal. She exhibits no distension and no mass. There is no hepatomegaly. There is no tenderness. There is no rebound and no guarding.  Genitourinary: Vagina normal.  Musculoskeletal: She exhibits edema and tenderness.  L>R knee swelling with TTP and reduced flexion  Lymphadenopathy:    She has no cervical adenopathy.  Neurological: She is alert and  oriented to person, place, and time.  Gait antalgic  Skin: Skin is warm and dry. No rash noted.  Psychiatric: She has a normal mood and affect. Her behavior is normal. Judgment and thought content normal.     Labs reviewed: Lab on 12/30/2015  Component Date Value Ref Range Status  . Glucose 12/30/2015 90  65 - 99 mg/dL Final  . BUN 12/30/2015 11  8 - 27 mg/dL Final  . Creatinine, Ser 12/30/2015 0.83  0.57 - 1.00 mg/dL Final  . GFR calc non Af Amer 12/30/2015 71  >59 mL/min/1.73 Final  . GFR calc Af Amer 12/30/2015 81  >59 mL/min/1.73 Final  . BUN/Creatinine Ratio 12/30/2015 13  12 - 28 Final  . Sodium 12/30/2015 141  134 - 144 mmol/L Final  . Potassium 12/30/2015 4.3  3.5 - 5.2 mmol/L Final  . Chloride 12/30/2015 103  96 - 106 mmol/L Final  . CO2 12/30/2015 23  18 - 29 mmol/L Final  . Calcium 12/30/2015 9.6  8.7 - 10.3 mg/dL Final  . Total Protein 12/30/2015 7.2  6.0 - 8.5 g/dL Final  . Albumin 12/30/2015 4.2  3.5 - 4.8 g/dL Final  . Globulin, Total 12/30/2015 3.0  1.5 - 4.5 g/dL Final  . Albumin/Globulin Ratio 12/30/2015 1.4  1.2 - 2.2 Final  . Bilirubin Total 12/30/2015 0.4  0.0 - 1.2 mg/dL Final  . Alkaline Phosphatase 12/30/2015 165* 39 - 117 IU/L Final  . AST 12/30/2015 25  0 - 40 IU/L Final  . ALT 12/30/2015 19  0 - 32 IU/L Final  . Cholesterol, Total 12/30/2015 169  100 - 199 mg/dL Final  . Triglycerides 12/30/2015 60  0 - 149 mg/dL  Final  . HDL 12/30/2015 61  >39 mg/dL Final  . VLDL Cholesterol Cal 12/30/2015 12  5 - 40 mg/dL Final  . LDL Calculated 12/30/2015 96  0 - 99 mg/dL Final  . Chol/HDL Ratio 12/30/2015 2.8  0.0 - 4.4 ratio units Final   Comment:                                   T. Chol/HDL Ratio                                             Men  Women                               1/2 Avg.Risk  3.4    3.3                                   Avg.Risk  5.0    4.4                                2X Avg.Risk  9.6    7.1                                3X Avg.Risk 23.4    11.0     No results found.   Assessment/Plan   ICD-9-CM ICD-10-CM   1. Chronic cough 786.2 R05 chlorpheniramine-HYDROcodone (TUSSIONEX PENNKINETIC ER) 10-8 MG/5ML SUER     Basic Metabolic Panel  2. Gastroesophageal reflux disease, esophagitis presence not specified 530.81 K21.9   3. LPRD (laryngopharyngeal reflux disease) 478.79 J38.7   4. Asthma, mild intermittent, uncomplicated 123456 [id number] Basic Metabolic Panel  5. Essential hypertension 401.9 [id number] Basic Metabolic Panel     ALT  6. Hyperlipidemia LDL goal <130 272.4 E78.5 ALT     Lipid Panel  7. Other allergic rhinitis 477.8 J30.89    May need to taper off procardia (decrease to 30mg  daily x 1 week-->every other day x 1 week and stop) to see if her cough improves. PI of procardia reports ADR cough <6% in clinical trials. She prefers to wait after she sees GI  F/u with as scheduled with GI  Continue current medications   Follow up in 4 mos for routine visit. Fasting labs prior to appt (bmp, alt, lipid panel)  Charmel Pronovost S. Perlie Gold  [facility] [address] [place], [place] [address] [phone] Cell (Monday-Friday 8 AM - 5 PM) [phone] After 5 PM and follow prompts

## 2016-01-01 NOTE — Patient Instructions (Signed)
Continue current medications   Follow up with GI as scheduled  Follow up in 4 mos for routine visit. Fasting labs prior to appt

## 2016-01-05 DIAGNOSIS — H40023 Open angle with borderline findings, high risk, bilateral: Secondary | ICD-10-CM | POA: Diagnosis not present

## 2016-01-08 DIAGNOSIS — J301 Allergic rhinitis due to pollen: Secondary | ICD-10-CM | POA: Diagnosis not present

## 2016-01-08 DIAGNOSIS — J3081 Allergic rhinitis due to animal (cat) (dog) hair and dander: Secondary | ICD-10-CM | POA: Diagnosis not present

## 2016-01-08 DIAGNOSIS — J3089 Other allergic rhinitis: Secondary | ICD-10-CM | POA: Diagnosis not present

## 2016-01-22 DIAGNOSIS — J301 Allergic rhinitis due to pollen: Secondary | ICD-10-CM | POA: Diagnosis not present

## 2016-01-22 DIAGNOSIS — J3089 Other allergic rhinitis: Secondary | ICD-10-CM | POA: Diagnosis not present

## 2016-01-22 DIAGNOSIS — J3081 Allergic rhinitis due to animal (cat) (dog) hair and dander: Secondary | ICD-10-CM | POA: Diagnosis not present

## 2016-01-23 ENCOUNTER — Other Ambulatory Visit: Payer: Self-pay | Admitting: Internal Medicine

## 2016-02-01 ENCOUNTER — Ambulatory Visit (INDEPENDENT_AMBULATORY_CARE_PROVIDER_SITE_OTHER): Payer: PPO | Admitting: Gastroenterology

## 2016-02-01 ENCOUNTER — Encounter: Payer: Self-pay | Admitting: Gastroenterology

## 2016-02-01 VITALS — BP 130/86 | HR 88 | Ht 62.0 in | Wt 229.2 lb

## 2016-02-01 DIAGNOSIS — R05 Cough: Secondary | ICD-10-CM

## 2016-02-01 DIAGNOSIS — R059 Cough, unspecified: Secondary | ICD-10-CM

## 2016-02-01 DIAGNOSIS — R159 Full incontinence of feces: Secondary | ICD-10-CM | POA: Diagnosis not present

## 2016-02-01 DIAGNOSIS — K5900 Constipation, unspecified: Secondary | ICD-10-CM | POA: Diagnosis not present

## 2016-02-01 NOTE — Patient Instructions (Addendum)
If you are age 72 or older, your body mass index should be between 23-30. Your Body mass index is 41.91 kg/(m^2). If this is out of the aforementioned range listed, please consider follow up with your Primary Care Provider.  If you are age 21 or younger, your body mass index should be between 19-25. Your Body mass index is 41.91 kg/(m^2). If this is out of the aformentioned range listed, please consider follow up with your Primary Care Provider.   2 tablespoons a day of citrucel or benefiber once a day.  Thank you for choosing [facility]  Dr Wilfrid Lund III

## 2016-02-01 NOTE — Progress Notes (Signed)
[facility] Progress Note  Chief Complaint: Fecal incontinence  Subjective History:  Wendy Figueroa sees me for the first time, having last seen Dr. Olevia Perches in May 2016 for many years of a chronic cough. Dr. 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 She was seen by ENT clinic at [facility], I accessed those records through care everywhere. Reports are described below. Wendy Figueroa has continued chronic cough that requires cough suppression twice a day. He has been quite a saga going on for years, and she has had no meaningful improvement on all the therapies noted below. The cough often happens while eating and drinking, though not consistently with any clear foods or consistencies. For the last several months she has also had a few episodes a week of fecal incontinence. It might occur after coughing, or sometimes just happen spontaneously an hour after bowel movement. She will be walking around and then suddenly note there is a streak of stool in her underpants. She has many years of urinary urgency and incontinence. She denies rectal bleeding or abdominal pain. She typically has a BM every 2 or 3 days. Last colonoscopy at 2 diminutive tubular adenomas in October 2014. An upper endoscopy May 2016 had a small hiatal hernia with a Schatzki ring. 35 French Maloney dilator was passed, and the patient recalls no improvement in her cough.  Reviewed Care Everywhere: MBS Aug 2016 at [facility] - prominent CP bar (no aspiration).  ENT thought dilation vs Botox.  Of note, Maloney dilation done during 5/16 EGD and no improvement.  ROS: Cardiovascular:  no chest pain Respiratory: no dyspnea  The patient's Past Medical, Family and Social History were reviewed and are on file in the EMR.  Objective:  Med list reviewed  Vital signs in last 24 hrs: Filed Vitals:   02/01/16 1108  BP: 130/86  Pulse: 88    Physical Exam   HEENT: sclera anicteric, oral  mucosa moist without lesions  Neck: supple, no thyromegaly, JVD or lymphadenopathy  Cardiac: RRR without murmurs, S1S2 heard, no peripheral edema  Pulm: clear to auscultation bilaterally, normal RR and effort noted  Abdomen: Obese soft, no tenderness, with active bowel sounds. No guarding or palpable hepatosplenomegaly.  Skin; warm and dry, no jaundice or rash Rectal (Chaperoned by MA Magdalene River): decreased resting ST, good voluntary tone Copious soft stool   Radiologic studies:  See above MBS report  @ASSESSMENTPLANBEGIN @ Assessment: Encounter Diagnoses  Name Primary?  . Fecal incontinence alternating with constipation Yes  . Cough     She has age-related decreased resting anal sphincter tone, exacerbated by chronic cough. 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 I have no idea whether Botox of the UES is likely to be helpful in this case.  I certainly would not pursue a fundoplication in this case. Unfortunately, without better control of the cough, she will continue to have urinary and fecal incontinence.  Plan:  Once daily fiber supplement to add bulk the stool and hopefully improve regularity so she will have less fecal incontinence. If no improvement, can consider referral to colorectal surgery, but I don't know if this is the optimal situation for local injection therapy to improve anal sphincter tone.  Total 30 minute time, over half spent reviewing records counseling  and coordinating care related to her chronic cough and fecal incontinence.  Wendy Figueroa

## 2016-02-05 DIAGNOSIS — J3081 Allergic rhinitis due to animal (cat) (dog) hair and dander: Secondary | ICD-10-CM | POA: Diagnosis not present

## 2016-02-05 DIAGNOSIS — J3089 Other allergic rhinitis: Secondary | ICD-10-CM | POA: Diagnosis not present

## 2016-02-05 DIAGNOSIS — J301 Allergic rhinitis due to pollen: Secondary | ICD-10-CM | POA: Diagnosis not present

## 2016-02-15 ENCOUNTER — Other Ambulatory Visit: Payer: Self-pay | Admitting: Internal Medicine

## 2016-02-18 DIAGNOSIS — J3081 Allergic rhinitis due to animal (cat) (dog) hair and dander: Secondary | ICD-10-CM | POA: Diagnosis not present

## 2016-02-18 DIAGNOSIS — J3089 Other allergic rhinitis: Secondary | ICD-10-CM | POA: Diagnosis not present

## 2016-02-18 DIAGNOSIS — J301 Allergic rhinitis due to pollen: Secondary | ICD-10-CM | POA: Diagnosis not present

## 2016-02-19 ENCOUNTER — Other Ambulatory Visit: Payer: Self-pay | Admitting: *Deleted

## 2016-02-19 ENCOUNTER — Telehealth: Payer: Self-pay | Admitting: Gastroenterology

## 2016-02-19 MED ORDER — PANTOPRAZOLE SODIUM 40 MG PO TBEC: DELAYED_RELEASE_TABLET | ORAL | Status: DC

## 2016-02-19 NOTE — Telephone Encounter (Signed)
She and I had a long discussion in the office after I researched her chronic cough and swallowing trouble. I agree that she needs to see the ENT at [facility], as there does not appear to be an endoscopic solution to this issue.

## 2016-02-19 NOTE — Telephone Encounter (Signed)
Patient requested refill to be faxed to pharmacy.  

## 2016-02-19 NOTE — Telephone Encounter (Signed)
The pt choked and passed out she states she wanted the number to  [facility] D.[facility]Returning Patient Appointments: [phone]  I provided this information.  I will also forward to Dr Loletha Carrow for review.  She was advised to chew her food well, take small bites and call back if she has any problems getting in to see Dr Rowe Clack.

## 2016-02-19 NOTE — Telephone Encounter (Signed)
Pt aware and will see someone at [facility]

## 2016-03-04 DIAGNOSIS — J3081 Allergic rhinitis due to animal (cat) (dog) hair and dander: Secondary | ICD-10-CM | POA: Diagnosis not present

## 2016-03-04 DIAGNOSIS — J3089 Other allergic rhinitis: Secondary | ICD-10-CM | POA: Diagnosis not present

## 2016-03-04 DIAGNOSIS — J301 Allergic rhinitis due to pollen: Secondary | ICD-10-CM | POA: Diagnosis not present

## 2016-03-07 ENCOUNTER — Telehealth: Payer: Self-pay | Admitting: *Deleted

## 2016-03-07 DIAGNOSIS — R05 Cough: Secondary | ICD-10-CM

## 2016-03-07 DIAGNOSIS — R053 Chronic cough: Secondary | ICD-10-CM

## 2016-03-07 NOTE — Telephone Encounter (Signed)
Patient called requesting a refill on Tussinex. Is this ok to refill? Please Advise.

## 2016-03-07 NOTE — Telephone Encounter (Signed)
Ok x 1

## 2016-03-08 MED ORDER — HYDROCOD POLST-CPM POLST ER 10-8 MG/5ML PO SUER: 5.0000 mL | Freq: Two times a day (BID) | ORAL | Status: DC | PRN

## 2016-03-08 NOTE — Telephone Encounter (Signed)
Rx printed. No refills can be added to Rx. Patient aware to pick up

## 2016-03-15 ENCOUNTER — Other Ambulatory Visit: Payer: Self-pay | Admitting: Internal Medicine

## 2016-03-17 DIAGNOSIS — R1310 Dysphagia, unspecified: Secondary | ICD-10-CM | POA: Diagnosis not present

## 2016-03-17 DIAGNOSIS — R490 Dysphonia: Secondary | ICD-10-CM | POA: Diagnosis not present

## 2016-03-17 DIAGNOSIS — K219 Gastro-esophageal reflux disease without esophagitis: Secondary | ICD-10-CM | POA: Diagnosis not present

## 2016-03-17 DIAGNOSIS — R05 Cough: Secondary | ICD-10-CM | POA: Diagnosis not present

## 2016-03-17 DIAGNOSIS — J383 Other diseases of vocal cords: Secondary | ICD-10-CM | POA: Diagnosis not present

## 2016-03-17 DIAGNOSIS — Z8719 Personal history of other diseases of the digestive system: Secondary | ICD-10-CM | POA: Diagnosis not present

## 2016-03-18 DIAGNOSIS — J301 Allergic rhinitis due to pollen: Secondary | ICD-10-CM | POA: Diagnosis not present

## 2016-03-18 DIAGNOSIS — J3089 Other allergic rhinitis: Secondary | ICD-10-CM | POA: Diagnosis not present

## 2016-03-18 DIAGNOSIS — J3081 Allergic rhinitis due to animal (cat) (dog) hair and dander: Secondary | ICD-10-CM | POA: Diagnosis not present

## 2016-03-20 ENCOUNTER — Other Ambulatory Visit: Payer: Self-pay | Admitting: Internal Medicine

## 2016-03-21 NOTE — Telephone Encounter (Signed)
Spoke with patient's husband to confirm patient is on both doses of Losartan.

## 2016-03-23 ENCOUNTER — Ambulatory Visit: Payer: PPO | Admitting: Podiatry

## 2016-04-01 DIAGNOSIS — J3089 Other allergic rhinitis: Secondary | ICD-10-CM | POA: Diagnosis not present

## 2016-04-01 DIAGNOSIS — J301 Allergic rhinitis due to pollen: Secondary | ICD-10-CM | POA: Diagnosis not present

## 2016-04-01 DIAGNOSIS — J3081 Allergic rhinitis due to animal (cat) (dog) hair and dander: Secondary | ICD-10-CM | POA: Diagnosis not present

## 2016-04-06 ENCOUNTER — Ambulatory Visit (INDEPENDENT_AMBULATORY_CARE_PROVIDER_SITE_OTHER): Payer: PPO | Admitting: Podiatry

## 2016-04-06 ENCOUNTER — Encounter: Payer: Self-pay | Admitting: Podiatry

## 2016-04-06 DIAGNOSIS — B351 Tinea unguium: Secondary | ICD-10-CM | POA: Diagnosis not present

## 2016-04-06 DIAGNOSIS — M79676 Pain in unspecified toe(s): Secondary | ICD-10-CM | POA: Diagnosis not present

## 2016-04-06 NOTE — Progress Notes (Signed)
Patient ID: Wendy Figueroa, female   DOB: Jun 27, 1946, 70 y.o.   MRN: [id number]     Subjective: This patient presents again for schedule visit complaining of elongated and thickened toenails which are on call for walking wearing shoes and she requests toenail debridement  Objective: No open skin lesions bilaterally  small keratoses  fifth right toe The toenails are brittle, deformed, discolored, hypertrophic and tender direct palpation 6-10 HAV deformities bilaterally Hammertoe second bilaterally  Assessment: Symptomatic onychomycoses 6-10  Plan: Debrided toenails 6-10 mechanically and electronically without any bleeding Debrided keratoses 1  without any bleeding  Reappoint 3 months

## 2016-04-08 NOTE — Addendum Note (Signed)
Addended by: Moshe Cipro MESHELL A on: 04/08/2016 03:44 PM   Modules accepted: Orders

## 2016-04-15 DIAGNOSIS — J3089 Other allergic rhinitis: Secondary | ICD-10-CM | POA: Diagnosis not present

## 2016-04-15 DIAGNOSIS — J3081 Allergic rhinitis due to animal (cat) (dog) hair and dander: Secondary | ICD-10-CM | POA: Diagnosis not present

## 2016-04-15 DIAGNOSIS — J301 Allergic rhinitis due to pollen: Secondary | ICD-10-CM | POA: Diagnosis not present

## 2016-04-18 DIAGNOSIS — R1319 Other dysphagia: Secondary | ICD-10-CM | POA: Diagnosis not present

## 2016-04-18 DIAGNOSIS — Z79899 Other long term (current) drug therapy: Secondary | ICD-10-CM | POA: Diagnosis not present

## 2016-04-18 DIAGNOSIS — J45909 Unspecified asthma, uncomplicated: Secondary | ICD-10-CM | POA: Diagnosis not present

## 2016-04-18 DIAGNOSIS — Z888 Allergy status to other drugs, medicaments and biological substances status: Secondary | ICD-10-CM | POA: Diagnosis not present

## 2016-04-18 DIAGNOSIS — Z880 Allergy status to penicillin: Secondary | ICD-10-CM | POA: Diagnosis not present

## 2016-04-18 DIAGNOSIS — I10 Essential (primary) hypertension: Secondary | ICD-10-CM | POA: Diagnosis not present

## 2016-04-18 DIAGNOSIS — R1314 Dysphagia, pharyngoesophageal phase: Secondary | ICD-10-CM | POA: Insufficient documentation

## 2016-04-18 DIAGNOSIS — K222 Esophageal obstruction: Secondary | ICD-10-CM | POA: Diagnosis not present

## 2016-04-18 DIAGNOSIS — J392 Other diseases of pharynx: Secondary | ICD-10-CM | POA: Diagnosis not present

## 2016-04-18 DIAGNOSIS — J9561 Intraoperative hemorrhage and hematoma of a respiratory system organ or structure complicating a respiratory system procedure: Secondary | ICD-10-CM | POA: Diagnosis not present

## 2016-04-18 DIAGNOSIS — Z7951 Long term (current) use of inhaled steroids: Secondary | ICD-10-CM | POA: Diagnosis not present

## 2016-04-18 DIAGNOSIS — Z885 Allergy status to narcotic agent status: Secondary | ICD-10-CM | POA: Diagnosis not present

## 2016-04-18 DIAGNOSIS — R1310 Dysphagia, unspecified: Secondary | ICD-10-CM | POA: Diagnosis not present

## 2016-04-18 DIAGNOSIS — K219 Gastro-esophageal reflux disease without esophagitis: Secondary | ICD-10-CM | POA: Diagnosis not present

## 2016-04-29 DIAGNOSIS — J3081 Allergic rhinitis due to animal (cat) (dog) hair and dander: Secondary | ICD-10-CM | POA: Diagnosis not present

## 2016-04-29 DIAGNOSIS — J3089 Other allergic rhinitis: Secondary | ICD-10-CM | POA: Diagnosis not present

## 2016-04-29 DIAGNOSIS — J301 Allergic rhinitis due to pollen: Secondary | ICD-10-CM | POA: Diagnosis not present

## 2016-05-03 DIAGNOSIS — J3089 Other allergic rhinitis: Secondary | ICD-10-CM | POA: Diagnosis not present

## 2016-05-03 DIAGNOSIS — J301 Allergic rhinitis due to pollen: Secondary | ICD-10-CM | POA: Diagnosis not present

## 2016-05-03 DIAGNOSIS — J3081 Allergic rhinitis due to animal (cat) (dog) hair and dander: Secondary | ICD-10-CM | POA: Diagnosis not present

## 2016-05-04 ENCOUNTER — Other Ambulatory Visit: Payer: PPO

## 2016-05-04 DIAGNOSIS — R05 Cough: Secondary | ICD-10-CM | POA: Diagnosis not present

## 2016-05-04 DIAGNOSIS — J4520 Mild intermittent asthma, uncomplicated: Secondary | ICD-10-CM | POA: Diagnosis not present

## 2016-05-04 DIAGNOSIS — E785 Hyperlipidemia, unspecified: Secondary | ICD-10-CM | POA: Diagnosis not present

## 2016-05-04 DIAGNOSIS — I10 Essential (primary) hypertension: Secondary | ICD-10-CM

## 2016-05-04 DIAGNOSIS — R053 Chronic cough: Secondary | ICD-10-CM

## 2016-05-04 LAB — LIPID PANEL
Cholesterol: 171 mg/dL (ref 125–200)
HDL: 62 mg/dL (ref >=46)
LDL CALC: 96 mg/dL (ref <130)
TRIGLYCERIDES: 65 mg/dL (ref <150)
Total CHOL/HDL Ratio: 2.8 Ratio (ref <=5.0)
VLDL: 13 mg/dL (ref <30)

## 2016-05-04 LAB — BASIC METABOLIC PANEL
BUN: 16 mg/dL (ref 7–25)
CHLORIDE: 109 mmol/L (ref 98–110)
CO2: 25 mmol/L (ref 20–31)
CREATININE: 0.87 mg/dL (ref 0.60–0.93)
Calcium: 9.8 mg/dL (ref 8.6–10.4)
Glucose, Bld: 93 mg/dL (ref 65–99)
POTASSIUM: 4.2 mmol/L (ref 3.5–5.3)
Sodium: 141 mmol/L (ref 135–146)

## 2016-05-04 LAB — ALT: ALT: 15 U/L (ref 6–29)

## 2016-05-06 ENCOUNTER — Encounter: Payer: Self-pay | Admitting: Internal Medicine

## 2016-05-06 ENCOUNTER — Ambulatory Visit (INDEPENDENT_AMBULATORY_CARE_PROVIDER_SITE_OTHER): Payer: PPO | Admitting: Internal Medicine

## 2016-05-06 VITALS — BP 132/74 | HR 86 | Temp 98.1°F | Ht 62.0 in | Wt 232.2 lb

## 2016-05-06 DIAGNOSIS — Z23 Encounter for immunization: Secondary | ICD-10-CM | POA: Diagnosis not present

## 2016-05-06 DIAGNOSIS — K219 Gastro-esophageal reflux disease without esophagitis: Secondary | ICD-10-CM

## 2016-05-06 DIAGNOSIS — J4520 Mild intermittent asthma, uncomplicated: Secondary | ICD-10-CM

## 2016-05-06 DIAGNOSIS — E785 Hyperlipidemia, unspecified: Secondary | ICD-10-CM

## 2016-05-06 DIAGNOSIS — I10 Essential (primary) hypertension: Secondary | ICD-10-CM

## 2016-05-06 DIAGNOSIS — R05 Cough: Secondary | ICD-10-CM

## 2016-05-06 DIAGNOSIS — R053 Chronic cough: Secondary | ICD-10-CM

## 2016-05-06 MED ORDER — HYDROCOD POLST-CPM POLST ER 10-8 MG/5ML PO SUER: 5.0000 mL | Freq: Two times a day (BID) | ORAL | 0 refills | Status: DC | PRN

## 2016-05-06 MED ORDER — ZOSTER VACCINE LIVE 19400 UNT/0.65ML ~~LOC~~ SUSR: 0.6500 mL | Freq: Once | SUBCUTANEOUS | 0 refills | Status: AC

## 2016-05-06 NOTE — Progress Notes (Signed)
Patient ID: Wendy Figueroa, female   DOB: [birth year], 70 y.o.   MRN: [id number]    Location:  [facility] Place of Service: OFFICE  Chief Complaint  Patient presents with  . Medical Management of Chronic Issues    4 month follow up  . Flu Vaccine    patient request     HPI:  70 yo female seen today for f/u. She had an upper esophageal sphincter balloon dilation and suspension microdirect laryngoscopy on 04/18/16 by Dr Rowe Clack at [facility]. She states cough significantly improved the 1st week but has gradually returned. Overall she feels 50% better. Next f/u appt Sept 28th. No further dysphagia. She is able to swallow potassium tabs without a problem. Needs RF on cough med which she takes prn  HTN - BP stable on on losartan 75 mg daily, procardia XL and lasix  Asthma - stable on HFA, spiriva, singulair and symbicort. No asthma exacerbations/attacks. She also takes xyzal and flonase for allergic rhinitis.  She gets allergy shots weekly. Followed by pulmonary  Hyperlipidemia - stable; takes pravachol. LDL 96  Arthritis - stable    Past Medical History:  Diagnosis Date  . Abnormal Pap smear    Over 79yrs ago  . ALKALINE PHOSPHATASE, ELEVATED 07/01/2009   Qualifier: Diagnosis of  By: [name] MD, [name]   . Allergic rhinitis   . Anemia   . Arthritis   . Asthma   . Cough    occasional  . Cystocele 11/05/08  . Diverticulosis of colon   . DJD (degenerative joint disease)    no per pt  . Elevated alkaline phosphatase level   . GERD (gastroesophageal reflux disease)   . H/O urinary incontinence 11/06/2008  . H/O varicella   . Hard measles   . Hx of colonic polyps   . Hypercholesterolemia   . Hypertension   . Leg swelling    occasional  . 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 Overweight(278.02)   . Rectocele 11/06/2008   Large  . Sebaceous cyst    on back  . Sinusitis   . Venous insufficiency   . Wheezing    occasional  . Yeast infection     Past  Surgical History:  Procedure Laterality Date  . ABDOMINAL HYSTERECTOMY  1988  . ANTERIOR AND POSTERIOR REPAIR  11/05/08  . APOGEE / PERIGEE REPAIR  10/2008   Dr. Octavio Manns  . bladder tack  2011  . BREAST SURGERY  1978   cyst removed from both removed  . COLONOSCOPY    . ESOPHAGOGASTRODUODENOSCOPY      Patient Care Team: [name], DO as PCP - General (Internal Medicine)  Social History   Social History  . Marital status: Married    Spouse name: [name]  . Number of children: 2  . Years of education: N/A   Occupational History  . LPN     Social History Main Topics  . Smoking status: Never Smoker  . Smokeless tobacco: Never Used  . Alcohol use Yes     Comment: social use  . Drug use: No  . Sexual activity: Not on file   Other Topics Concern  . Not on file   Social History Narrative   Married   Never smoked   Alcohol few beers on week-end   Exercise walk 5 days a week for one hour   No Advance Directive      reports that she has never smoked. She has never used  smokeless tobacco. She reports that she drinks alcohol. She reports that she does not use drugs.  Family History  Problem Relation Age of Onset  . Aneurysm Mother   . Lung cancer Father   . Hypertension Daughter   . Hypertension Daughter   . Colon cancer Neg Hx   . Esophageal cancer Neg Hx   . Stomach cancer Neg Hx   . Rectal cancer Neg Hx   . Colon polyps Neg Hx    Family Status  Relation Status  . Mother Deceased at age 31   stroke  . Father Deceased at age 13   cancer  . Daughter Alive  . Daughter Alive  . Neg Hx      Allergies  Allergen Reactions  . Demerol Hives    All over the body   . Lisinopril-Hydrochlorothiazide Hives    All over the body  . Penicillins Hives    All over the body    Medications: Patient's Medications  New Prescriptions   No medications on file  Previous Medications   ALBUTEROL (PROVENTIL HFA;VENTOLIN HFA) 108 (90 BASE) MCG/ACT INHALER    Inhale 2  puffs into the lungs every 6 (six) hours as needed.     BUDESONIDE-FORMOTEROL (SYMBICORT) 160-4.5 MCG/ACT INHALER    Inhale 2 puffs into the lungs 2 (two) times daily.     CALCIUM CARB-CHOLECALCIFEROL (CALCIUM 600 + D PO)    Take by mouth. Take one tablet daily   CHLORPHENIRAMINE-HYDROCODONE (TUSSIONEX PENNKINETIC ER) 10-8 MG/5ML SUER    Take 5 mLs by mouth every 12 (twelve) hours as needed for cough.   FLUTICASONE (FLONASE) 50 MCG/ACT NASAL SPRAY    2 sprays daily.   FUROSEMIDE (LASIX) 40 MG TABLET    TAKE 1 TABLET (40 MG TOTAL) BY MOUTH DAILY.   GABAPENTIN (NEURONTIN) 300 MG CAPSULE    Take 300 mg by mouth 3 (three) times daily.   KLOR-CON M20 20 MEQ TABLET    TAKE 2 TABLETS BY MOUTH DAILY FOR POTASSIUM SUPPLEMENT   LEVOCETIRIZINE (XYZAL) 5 MG TABLET    Take 5 mg by mouth every evening.     LOSARTAN (COZAAR) 25 MG TABLET    Take 25 mg by mouth daily. Along with 50 mg tablet   LOSARTAN (COZAAR) 50 MG TABLET    TAKE ONE TABLET BY MOUTH ONCE DAILY FOR BLOOD PRESSURE   MONTELUKAST (SINGULAIR) 10 MG TABLET    Take 10 mg by mouth at bedtime.     MULTIPLE VITAMINS-MINERALS (MACULAR VITAMIN BENEFIT PO)    Take 1 capsule by mouth 2 (two) times daily.     NIFEDIPINE (PROCARDIA XL/ADALAT-CC) 60 MG 24 HR TABLET    TAKE 1 TABLET (60 MG TOTAL) BY MOUTH DAILY.   NON FORMULARY    Allergy shots once weekly   OMEGA-3 FATTY ACIDS (FISH OIL PO)    Take by mouth daily.   PANTOPRAZOLE (PROTONIX) 40 MG TABLET    Take one tablet by mouth twice daily for stomach   PRAVASTATIN (PRAVACHOL) 40 MG TABLET    TAKE 1 TABLET BY MOUTH EVERY DAY   RANITIDINE (ZANTAC) 300 MG TABLET    Take 300 mg by mouth at bedtime.   SPIRIVA HANDIHALER 18 MCG INHALATION CAPSULE    18 mcg 2 (two) times daily.  Modified Medications   Modified Medication Previous Medication   ZOSTER VACCINE LIVE, PF, (ZOSTAVAX) 60454 UNT/0.65ML INJECTION Zoster Vaccine Live, PF, (ZOSTAVAX) 09811 UNT/0.65ML injection      Inject 19,400  Units into the skin once.     Inject 0.65 mLs into the skin once.  Discontinued Medications   LOSARTAN (COZAAR) 50 MG TABLET    TAKE 1 TABLET EVERY DAY FOR BLOOD PRESSURE    Review of Systems  HENT: Negative for trouble swallowing.   Respiratory: Positive for cough.   Cardiovascular: Positive for leg swelling.  Musculoskeletal: Positive for arthralgias and gait problem.  All other systems reviewed and are negative.   Vitals:   05/06/16 0926  BP: 132/74  Pulse: 86  Temp: 98.1 F (36.7 C)  TempSrc: Oral  SpO2: 98%  Weight: 232 lb 3.2 oz (105.3 kg)  Height: 5\' 2"  (1.575 m)   Body mass index is 42.47 kg/m.  Physical Exam  Constitutional: She is oriented to person, place, and time. She appears well-developed and well-nourished. No distress.  Looks well  HENT:  Mouth/Throat: Oropharynx is clear and moist. No oropharyngeal exudate.  Voice slightly raspy  Eyes: Pupils are equal, round, and reactive to light. No scleral icterus.  Neck: Neck supple. Carotid bruit is not present. No tracheal deviation present. No thyromegaly present.  Cardiovascular: Normal rate, regular rhythm, normal heart sounds and intact distal pulses.  Exam reveals no gallop and no friction rub.   No murmur heard. +1 pitting LE edema b/l. no calf TTP.   Pulmonary/Chest: Effort normal and breath sounds normal. No stridor. No respiratory distress. She has no wheezes. She has no rales.  Abdominal: Soft. Bowel sounds are normal. She exhibits no distension and no mass. There is no hepatomegaly. There is no tenderness. There is no rebound and no guarding.  Genitourinary: Vagina normal.  Musculoskeletal: She exhibits edema and tenderness.  L>R knee swelling with TTP and reduced flexion  Lymphadenopathy:    She has no cervical adenopathy.  Neurological: She is alert and oriented to person, place, and time.  Gait antalgic  Skin: Skin is warm and dry. No rash noted.  Psychiatric: She has a normal mood and affect. Her behavior is normal.  Judgment and thought content normal.     Labs reviewed: Appointment on 05/04/2016  Component Date Value Ref Range Status  . Sodium 05/04/2016 141  135 - 146 mmol/L Final  . Potassium 05/04/2016 4.2  3.5 - 5.3 mmol/L Final  . Chloride 05/04/2016 109  98 - 110 mmol/L Final  . CO2 05/04/2016 25  20 - 31 mmol/L Final  . Glucose, Bld 05/04/2016 93  65 - 99 mg/dL Final  . BUN 05/04/2016 16  7 - 25 mg/dL Final  . Creat 05/04/2016 0.87  0.60 - 0.93 mg/dL Final   Comment:   For patients > or = 72 years of age: The upper reference limit for Creatinine is approximately 13% higher for people identified as African-American.     . Calcium 05/04/2016 9.8  8.6 - 10.4 mg/dL Final  . ALT 05/04/2016 15  6 - 29 U/L Final  . Cholesterol 05/04/2016 171  125 - 200 mg/dL Final  . Triglycerides 05/04/2016 65  <150 mg/dL Final  . HDL 05/04/2016 62  >=46 mg/dL Final  . Total CHOL/HDL Ratio 05/04/2016 2.8  <=5.0 Ratio Final  . VLDL 05/04/2016 13  <30 mg/dL Final  . LDL Cholesterol 05/04/2016 96  <130 mg/dL Final   Comment:   Total Cholesterol/HDL Ratio:CHD Risk                        Coronary Heart Disease Risk Table  Men       Women          1/2 Average Risk              3.4        3.3              Average Risk              5.0        4.4           2X Average Risk              9.6        7.1           3X Average Risk             23.4       11.0 Use the calculated Patient Ratio above and the CHD Risk table  to determine the patient's CHD Risk.     No results found.   Assessment/Plan   ICD-9-CM ICD-10-CM   1. Chronic cough - improved 786.2 R05 chlorpheniramine-HYDROcodone (TUSSIONEX PENNKINETIC ER) 10-8 MG/5ML SUER  2. Encounter for immunization Z23 Z23 Flu Vaccine QUAD 36+ mos IM  3. Gastroesophageal reflux disease, esophagitis presence not specified 530.81 K21.9   4. Asthma, mild intermittent, uncomplicated 123456 [id number]   5. Essential hypertension 401.9  I10   6. Hyperlipidemia LDL goal <130 272.4 E78.5    Continue current medications as ordered  Follow up with Dr Rowe Clack as scheduled  Follow up in 4 mos for CPE/ECG. Fasting labs prior to appt (cbc w diff, cmp, lipid panel, tsh, ua)  Constant [place] S. Perlie Gold  [facility] [address] [place], [place] [address] [phone] Cell (Monday-Friday 8 AM - 5 PM) [phone] After 5 PM and follow prompts

## 2016-05-06 NOTE — Patient Instructions (Signed)
Continue current medications as ordered  Follow up with Dr Rowe Clack as scheduled  Follow up in 4 mos for CPE/ECG. Fasting labs prior to appt

## 2016-05-13 ENCOUNTER — Telehealth: Payer: Self-pay | Admitting: *Deleted

## 2016-05-13 ENCOUNTER — Telehealth: Payer: Self-pay | Admitting: Pulmonary Disease

## 2016-05-13 DIAGNOSIS — J301 Allergic rhinitis due to pollen: Secondary | ICD-10-CM | POA: Diagnosis not present

## 2016-05-13 DIAGNOSIS — J3089 Other allergic rhinitis: Secondary | ICD-10-CM | POA: Diagnosis not present

## 2016-05-13 DIAGNOSIS — J3081 Allergic rhinitis due to animal (cat) (dog) hair and dander: Secondary | ICD-10-CM | POA: Diagnosis not present

## 2016-05-13 NOTE — Telephone Encounter (Signed)
Patient called requesting samples of Symbicort and Spiriva. No samples available but left savings card for the Symbicort up front for patient to pick up

## 2016-05-13 NOTE — Telephone Encounter (Signed)
LM x 1 

## 2016-05-16 ENCOUNTER — Other Ambulatory Visit: Payer: Self-pay | Admitting: *Deleted

## 2016-05-16 MED ORDER — PANTOPRAZOLE SODIUM 40 MG PO TBEC: DELAYED_RELEASE_TABLET | ORAL | 3 refills | Status: DC

## 2016-05-16 MED ORDER — BUDESONIDE-FORMOTEROL FUMARATE 160-4.5 MCG/ACT IN AERO: 2.0000 | INHALATION_SPRAY | Freq: Two times a day (BID) | RESPIRATORY_TRACT | 0 refills | Status: DC

## 2016-05-16 NOTE — Telephone Encounter (Signed)
[facility] 

## 2016-05-16 NOTE — Telephone Encounter (Signed)
Spoke with pt and advised that we do have Symbicort 160 but not Spiriva Handihalers. She will come tomorrow to pick up Symbicort. Nothing further needed.

## 2016-05-19 DIAGNOSIS — R1314 Dysphagia, pharyngoesophageal phase: Secondary | ICD-10-CM | POA: Diagnosis not present

## 2016-05-19 DIAGNOSIS — Z79899 Other long term (current) drug therapy: Secondary | ICD-10-CM | POA: Diagnosis not present

## 2016-05-19 DIAGNOSIS — R490 Dysphonia: Secondary | ICD-10-CM | POA: Diagnosis not present

## 2016-05-19 DIAGNOSIS — K219 Gastro-esophageal reflux disease without esophagitis: Secondary | ICD-10-CM | POA: Diagnosis not present

## 2016-05-19 DIAGNOSIS — K224 Dyskinesia of esophagus: Secondary | ICD-10-CM | POA: Diagnosis not present

## 2016-05-19 DIAGNOSIS — F458 Other somatoform disorders: Secondary | ICD-10-CM | POA: Diagnosis not present

## 2016-05-19 DIAGNOSIS — R05 Cough: Secondary | ICD-10-CM | POA: Diagnosis not present

## 2016-05-19 DIAGNOSIS — Z8719 Personal history of other diseases of the digestive system: Secondary | ICD-10-CM | POA: Diagnosis not present

## 2016-05-20 DIAGNOSIS — J4540 Moderate persistent asthma, uncomplicated: Secondary | ICD-10-CM | POA: Diagnosis not present

## 2016-05-20 DIAGNOSIS — J3089 Other allergic rhinitis: Secondary | ICD-10-CM | POA: Diagnosis not present

## 2016-05-20 DIAGNOSIS — J3081 Allergic rhinitis due to animal (cat) (dog) hair and dander: Secondary | ICD-10-CM | POA: Diagnosis not present

## 2016-05-20 DIAGNOSIS — J301 Allergic rhinitis due to pollen: Secondary | ICD-10-CM | POA: Diagnosis not present

## 2016-05-27 DIAGNOSIS — J3081 Allergic rhinitis due to animal (cat) (dog) hair and dander: Secondary | ICD-10-CM | POA: Diagnosis not present

## 2016-05-27 DIAGNOSIS — J3089 Other allergic rhinitis: Secondary | ICD-10-CM | POA: Diagnosis not present

## 2016-05-27 DIAGNOSIS — J301 Allergic rhinitis due to pollen: Secondary | ICD-10-CM | POA: Diagnosis not present

## 2016-06-04 ENCOUNTER — Other Ambulatory Visit: Payer: Self-pay | Admitting: Internal Medicine

## 2016-06-10 DIAGNOSIS — J301 Allergic rhinitis due to pollen: Secondary | ICD-10-CM | POA: Diagnosis not present

## 2016-06-10 DIAGNOSIS — J3081 Allergic rhinitis due to animal (cat) (dog) hair and dander: Secondary | ICD-10-CM | POA: Diagnosis not present

## 2016-06-10 DIAGNOSIS — J3089 Other allergic rhinitis: Secondary | ICD-10-CM | POA: Diagnosis not present

## 2016-06-11 ENCOUNTER — Other Ambulatory Visit: Payer: Self-pay | Admitting: Internal Medicine

## 2016-06-24 DIAGNOSIS — J301 Allergic rhinitis due to pollen: Secondary | ICD-10-CM | POA: Diagnosis not present

## 2016-06-24 DIAGNOSIS — J3081 Allergic rhinitis due to animal (cat) (dog) hair and dander: Secondary | ICD-10-CM | POA: Diagnosis not present

## 2016-06-24 DIAGNOSIS — J3089 Other allergic rhinitis: Secondary | ICD-10-CM | POA: Diagnosis not present

## 2016-07-04 ENCOUNTER — Telehealth: Payer: Self-pay | Admitting: *Deleted

## 2016-07-04 DIAGNOSIS — R059 Cough, unspecified: Secondary | ICD-10-CM

## 2016-07-04 DIAGNOSIS — R053 Chronic cough: Secondary | ICD-10-CM

## 2016-07-04 DIAGNOSIS — R05 Cough: Secondary | ICD-10-CM

## 2016-07-04 MED ORDER — HYDROCOD POLST-CPM POLST ER 10-8 MG/5ML PO SUER: 5.0000 mL | Freq: Two times a day (BID) | ORAL | 0 refills | Status: DC | PRN

## 2016-07-04 NOTE — Telephone Encounter (Signed)
Rx printed and patient notified and will pick up 

## 2016-07-04 NOTE — Telephone Encounter (Signed)
Left message with female to inform patient rx is signed and ready for pick-up

## 2016-07-04 NOTE — Telephone Encounter (Signed)
Patient is calling requesting a refill on her Tussionex. Is this ok to refill. Please Advise.

## 2016-07-04 NOTE — Telephone Encounter (Signed)
Ok to RF x 1

## 2016-07-06 ENCOUNTER — Ambulatory Visit: Payer: PPO | Admitting: Podiatry

## 2016-07-07 ENCOUNTER — Other Ambulatory Visit: Payer: Self-pay | Admitting: Internal Medicine

## 2016-07-08 DIAGNOSIS — J3089 Other allergic rhinitis: Secondary | ICD-10-CM | POA: Diagnosis not present

## 2016-07-08 DIAGNOSIS — J301 Allergic rhinitis due to pollen: Secondary | ICD-10-CM | POA: Diagnosis not present

## 2016-07-08 DIAGNOSIS — J3081 Allergic rhinitis due to animal (cat) (dog) hair and dander: Secondary | ICD-10-CM | POA: Diagnosis not present

## 2016-07-22 DIAGNOSIS — J3081 Allergic rhinitis due to animal (cat) (dog) hair and dander: Secondary | ICD-10-CM | POA: Diagnosis not present

## 2016-07-22 DIAGNOSIS — J301 Allergic rhinitis due to pollen: Secondary | ICD-10-CM | POA: Diagnosis not present

## 2016-07-22 DIAGNOSIS — J3089 Other allergic rhinitis: Secondary | ICD-10-CM | POA: Diagnosis not present

## 2016-07-27 ENCOUNTER — Encounter: Payer: Self-pay | Admitting: Podiatry

## 2016-07-27 ENCOUNTER — Ambulatory Visit (INDEPENDENT_AMBULATORY_CARE_PROVIDER_SITE_OTHER): Payer: PPO | Admitting: Podiatry

## 2016-07-27 DIAGNOSIS — B351 Tinea unguium: Secondary | ICD-10-CM | POA: Diagnosis not present

## 2016-07-27 DIAGNOSIS — M79676 Pain in unspecified toe(s): Secondary | ICD-10-CM | POA: Diagnosis not present

## 2016-07-27 NOTE — Progress Notes (Signed)
Patient ID: Wendy Figueroa, female   DOB: 08/05/1946, 70 y.o.   MRN: [id number]    Subjective: This patient presents again for schedule visit complaining of elongated and thickened toenails which are on call for walking wearing shoes and she requests toenail debridement  Objective: Orientated 3 Nonpitting edema left lower extremity, with long history DP and PT pulses 2/4 bilaterally Capillary reflex immediate bilaterally Sensation to 10 g monofilament wire intact 5/5 bilaterally Vibratory sensation reactive bilaterally Ankle reflex equal and reactive bilaterally HAV bilaterally Hammertoe second bilaterally Manual motor testing dorsi flexion, plantar flexion, inversion, eversion 5/5 bilaterally No open skin lesions bilaterally  small keratoses fifth right toe The toenails are brittle, deformed, discolored, hypertrophic and tender direct palpation 6-10   Assessment: Symptomatic onychomycoses 6-10  Plan: Debrided toenails 6-10 mechanically and electronically without any bleeding Debrided keratoses 1 without any bleeding  Reappoint 3 months 

## 2016-08-01 ENCOUNTER — Other Ambulatory Visit: Payer: Self-pay | Admitting: Internal Medicine

## 2016-08-05 DIAGNOSIS — J3089 Other allergic rhinitis: Secondary | ICD-10-CM | POA: Diagnosis not present

## 2016-08-05 DIAGNOSIS — J301 Allergic rhinitis due to pollen: Secondary | ICD-10-CM | POA: Diagnosis not present

## 2016-08-05 DIAGNOSIS — J3081 Allergic rhinitis due to animal (cat) (dog) hair and dander: Secondary | ICD-10-CM | POA: Diagnosis not present

## 2016-08-10 ENCOUNTER — Other Ambulatory Visit: Payer: Self-pay | Admitting: Internal Medicine

## 2016-08-12 DIAGNOSIS — J3081 Allergic rhinitis due to animal (cat) (dog) hair and dander: Secondary | ICD-10-CM | POA: Diagnosis not present

## 2016-08-12 DIAGNOSIS — J301 Allergic rhinitis due to pollen: Secondary | ICD-10-CM | POA: Diagnosis not present

## 2016-08-12 DIAGNOSIS — J3089 Other allergic rhinitis: Secondary | ICD-10-CM | POA: Diagnosis not present

## 2016-08-19 DIAGNOSIS — J3081 Allergic rhinitis due to animal (cat) (dog) hair and dander: Secondary | ICD-10-CM | POA: Diagnosis not present

## 2016-08-19 DIAGNOSIS — J3089 Other allergic rhinitis: Secondary | ICD-10-CM | POA: Diagnosis not present

## 2016-08-19 DIAGNOSIS — J301 Allergic rhinitis due to pollen: Secondary | ICD-10-CM | POA: Diagnosis not present

## 2016-08-26 DIAGNOSIS — J3081 Allergic rhinitis due to animal (cat) (dog) hair and dander: Secondary | ICD-10-CM | POA: Diagnosis not present

## 2016-08-26 DIAGNOSIS — J3089 Other allergic rhinitis: Secondary | ICD-10-CM | POA: Diagnosis not present

## 2016-08-26 DIAGNOSIS — J301 Allergic rhinitis due to pollen: Secondary | ICD-10-CM | POA: Diagnosis not present

## 2016-08-30 DIAGNOSIS — K219 Gastro-esophageal reflux disease without esophagitis: Secondary | ICD-10-CM | POA: Diagnosis not present

## 2016-08-30 DIAGNOSIS — K449 Diaphragmatic hernia without obstruction or gangrene: Secondary | ICD-10-CM | POA: Diagnosis not present

## 2016-08-30 DIAGNOSIS — K224 Dyskinesia of esophagus: Secondary | ICD-10-CM | POA: Diagnosis not present

## 2016-08-30 DIAGNOSIS — F458 Other somatoform disorders: Secondary | ICD-10-CM | POA: Diagnosis not present

## 2016-08-30 DIAGNOSIS — R1314 Dysphagia, pharyngoesophageal phase: Secondary | ICD-10-CM | POA: Diagnosis not present

## 2016-08-30 DIAGNOSIS — R05 Cough: Secondary | ICD-10-CM | POA: Diagnosis not present

## 2016-08-30 DIAGNOSIS — R490 Dysphonia: Secondary | ICD-10-CM | POA: Diagnosis not present

## 2016-08-30 DIAGNOSIS — R1310 Dysphagia, unspecified: Secondary | ICD-10-CM | POA: Diagnosis not present

## 2016-09-05 ENCOUNTER — Other Ambulatory Visit: Payer: PPO

## 2016-09-07 ENCOUNTER — Other Ambulatory Visit: Payer: PPO

## 2016-09-07 ENCOUNTER — Ambulatory Visit: Payer: PPO

## 2016-09-08 ENCOUNTER — Ambulatory Visit: Payer: PPO

## 2016-09-08 ENCOUNTER — Other Ambulatory Visit: Payer: PPO

## 2016-09-09 ENCOUNTER — Other Ambulatory Visit: Payer: Self-pay

## 2016-09-09 ENCOUNTER — Other Ambulatory Visit: Payer: PPO

## 2016-09-09 ENCOUNTER — Encounter: Payer: Self-pay | Admitting: Internal Medicine

## 2016-09-09 ENCOUNTER — Encounter: Payer: PPO | Admitting: Internal Medicine

## 2016-09-09 ENCOUNTER — Ambulatory Visit (INDEPENDENT_AMBULATORY_CARE_PROVIDER_SITE_OTHER): Payer: PPO | Admitting: Internal Medicine

## 2016-09-09 VITALS — BP 132/70 | HR 87 | Temp 98.0°F | Ht 62.0 in | Wt 222.8 lb

## 2016-09-09 DIAGNOSIS — J3081 Allergic rhinitis due to animal (cat) (dog) hair and dander: Secondary | ICD-10-CM | POA: Diagnosis not present

## 2016-09-09 DIAGNOSIS — J3089 Other allergic rhinitis: Secondary | ICD-10-CM | POA: Diagnosis not present

## 2016-09-09 DIAGNOSIS — I10 Essential (primary) hypertension: Secondary | ICD-10-CM

## 2016-09-09 DIAGNOSIS — R05 Cough: Secondary | ICD-10-CM | POA: Diagnosis not present

## 2016-09-09 DIAGNOSIS — E2839 Other primary ovarian failure: Secondary | ICD-10-CM | POA: Diagnosis not present

## 2016-09-09 DIAGNOSIS — E785 Hyperlipidemia, unspecified: Secondary | ICD-10-CM | POA: Diagnosis not present

## 2016-09-09 DIAGNOSIS — M8580 Other specified disorders of bone density and structure, unspecified site: Secondary | ICD-10-CM | POA: Diagnosis not present

## 2016-09-09 DIAGNOSIS — K219 Gastro-esophageal reflux disease without esophagitis: Secondary | ICD-10-CM

## 2016-09-09 DIAGNOSIS — J301 Allergic rhinitis due to pollen: Secondary | ICD-10-CM | POA: Diagnosis not present

## 2016-09-09 DIAGNOSIS — R053 Chronic cough: Secondary | ICD-10-CM

## 2016-09-09 DIAGNOSIS — Z0000 Encounter for general adult medical examination without abnormal findings: Secondary | ICD-10-CM | POA: Diagnosis not present

## 2016-09-09 DIAGNOSIS — R059 Cough, unspecified: Secondary | ICD-10-CM

## 2016-09-09 LAB — CBC WITH DIFFERENTIAL/PLATELET
BASOS ABS: 57 {cells}/uL (ref 0–200)
Basophils Relative: 1 %
EOS PCT: 3 %
Eosinophils Absolute: 171 cells/uL (ref 15–500)
HCT: 38.9 % (ref 35.0–45.0)
HEMOGLOBIN: 12.4 g/dL (ref 11.7–15.5)
LYMPHS ABS: 2280 {cells}/uL (ref 850–3900)
Lymphocytes Relative: 40 %
MCH: 25.8 pg — AB (ref 27.0–33.0)
MCHC: 31.9 g/dL — AB (ref 32.0–36.0)
MCV: 81 fL (ref 80.0–100.0)
MONOS PCT: 8 %
MPV: 10.4 fL (ref 7.5–12.5)
Monocytes Absolute: 456 cells/uL (ref 200–950)
Neutro Abs: 2736 cells/uL (ref 1500–7800)
Neutrophils Relative %: 48 %
Platelets: 285 10*3/uL (ref 140–400)
RBC: 4.8 MIL/uL (ref 3.80–5.10)
RDW: 15.2 % — ABNORMAL HIGH (ref 11.0–15.0)
WBC: 5.7 10*3/uL (ref 3.8–10.8)

## 2016-09-09 LAB — COMPLETE METABOLIC PANEL WITH GFR
ALT: 14 U/L (ref 6–29)
AST: 21 U/L (ref 10–35)
Albumin: 4.2 g/dL (ref 3.6–5.1)
Alkaline Phosphatase: 173 U/L — ABNORMAL HIGH (ref 33–130)
BILIRUBIN TOTAL: 0.5 mg/dL (ref 0.2–1.2)
BUN: 9 mg/dL (ref 7–25)
CHLORIDE: 104 mmol/L (ref 98–110)
CO2: 27 mmol/L (ref 20–31)
CREATININE: 0.82 mg/dL (ref 0.60–0.93)
Calcium: 9.9 mg/dL (ref 8.6–10.4)
GFR, Est African American: 83 mL/min (ref >=60)
GFR, Est Non African American: 72 mL/min (ref >=60)
Glucose, Bld: 95 mg/dL (ref 65–99)
Potassium: 4.6 mmol/L (ref 3.5–5.3)
Sodium: 140 mmol/L (ref 135–146)
TOTAL PROTEIN: 7.7 g/dL (ref 6.1–8.1)

## 2016-09-09 LAB — URINALYSIS, ROUTINE W REFLEX MICROSCOPIC
Bilirubin Urine: NEGATIVE
Glucose, UA: NEGATIVE
HGB URINE DIPSTICK: NEGATIVE
KETONES UR: NEGATIVE
LEUKOCYTES UA: NEGATIVE
NITRITE: NEGATIVE
PROTEIN: NEGATIVE
Specific Gravity, Urine: 1.012 (ref 1.001–1.035)
pH: 7.5 (ref 5.0–8.0)

## 2016-09-09 LAB — LIPID PANEL
Cholesterol: 168 mg/dL (ref <200)
HDL: 61 mg/dL (ref >50)
LDL CALC: 90 mg/dL (ref <100)
Total CHOL/HDL Ratio: 2.8 Ratio (ref <5.0)
Triglycerides: 83 mg/dL (ref <150)
VLDL: 17 mg/dL (ref <30)

## 2016-09-09 LAB — TSH: TSH: 1.42 m[IU]/L

## 2016-09-09 MED ORDER — HYDROCOD POLST-CPM POLST ER 10-8 MG/5ML PO SUER: 5.0000 mL | Freq: Two times a day (BID) | ORAL | 0 refills | Status: DC | PRN

## 2016-09-09 NOTE — Patient Instructions (Addendum)
Encouraged her to exercise 30-45 minutes 4-5 times per week. Eat a well balanced diet. Avoid smoking. Limit alcohol intake. Wear seatbelt when riding in the car. Wear sun block (SPF >50) when spending extended times outside.  Follow up with specialists as scheduled  Continue current medications as ordered  Will call with lab results  Will call with bone density results  Follow up in 4 mos for routine visit.

## 2016-09-09 NOTE — Progress Notes (Signed)
Patient ID: Wendy Figueroa, female   DOB: 10/27/1945, 71 y.o.   MRN: [id number]   Location:  [facility]  Place of Service:  OFFICE  Provider: Arletha Grippe, DO  Patient Care Team: [name], DO as PCP - General (Internal Medicine)  Extended Emergency Contact Information Primary Emergency Contact: [name], [name] Address: [address]          [place], [place] Phone: [phone] Mobile Phone: [phone] Relation: Spouse  Code Status: FULL CODE Goals of Care: Advanced Directive information Advanced Directives 09/09/2016  Does Patient Have a Medical Advance Directive? Yes  Type of Advance Directive Living will  Would patient like information on creating a medical advance directive? -     Chief Complaint  Patient presents with  . Annual Exam    Extended Visit    HPI: Patient is a 71 y.o. female seen in today for an annual wellness exam.    Hx dysphagia/cough - She had an upper esophageal sphincter balloon dilation and suspension microdirect laryngoscopy on 04/18/16 by Dr Rowe Clack at [facility]. She states cough initially better but has gradually returned. She feels that she gets strangled at times. She f/u with Dr Rowe Clack and was started on gabapentin which she states caused significant fatigue and she stopped taking it. She continues to have dysphagia. Takes protonix BID for GERD. She is due to have repeat MBS next month. She is able to swallow potassium tabs occasionally with difficulty.   HTN - BP stable on on losartan 75 mg daily, procardia XL and lasix  Asthma - stable on HFA prn ( has not needed it in "quite a while"), spiriva daily, singulair daily and symbicort every other day. No asthma exacerbations/attacks. She also takes xyzal and flonase for allergic rhinitis.  She gets allergy shots weekly. Followed by pulmonary  Hyperlipidemia - stable; takes pravachol. LDL 96  Arthritis - stable   Depression screen [facility] 2/9 09/09/2016  01/01/2016 05/29/2015 10/14/2014 06/04/2014  Decreased Interest 0 0 0 0 0  Down, Depressed, Hopeless 0 0 0 0 0  PHQ - 2 Score 0 0 0 0 0    Fall Risk  09/09/2016 05/06/2016 01/01/2016 05/29/2015 01/21/2015  Falls in the past year? No Yes No No No  Number falls in past yr: - 1 - - -  Injury with Fall? - No - - -   MMSE - Mini Mental State Exam 09/09/2016 05/29/2015  Orientation to time 4 5  Orientation to Place 5 5  Registration 3 3  Attention/ Calculation 3 5  Recall 2 3  Language- name 2 objects 2 2  Language- repeat 1 1  Language- follow 3 step command 2 3  Language- read & follow direction 1 1  Write a sentence 1 1  Copy design 1 1  Total score 25 30     Health Maintenance  Topic Date Due  . Hepatitis C Screening  Aug 06, 1946  . ZOSTAVAX  08/23/2023 (Originally 10/29/2003)  . TETANUS/TDAP  08/23/2023 (Originally 10/29/1962)  . MAMMOGRAM  07/01/2017  . COLONOSCOPY  06/12/2018  . INFLUENZA VACCINE  Completed  . DEXA SCAN  Completed  . PNA vac Low Risk Adult  Completed    Urinary incontinence? Stress related (cough/sneezing)  Functional Status Survey: Is the patient deaf or have difficulty hearing?: No Does the patient have difficulty seeing, even when wearing glasses/contacts?: No Does the patient have difficulty concentrating, remembering, or making decisions?: No Does the patient have difficulty  walking or climbing stairs?: Yes (climbing stairs) Does the patient have difficulty dressing or bathing?: No Does the patient have difficulty doing errands alone such as visiting a doctor's office or shopping?: No  [facility]  Diet? Maintains healthy food choices  Vision Screening Comments: Patient has eye appt. On 09/12/2016 with Dr. Herbert Deaner   Hearing: no issues    Dentition: sees dentist every 6 mos  Pain: no issues  Past Medical History:  Diagnosis Date  . Abnormal Pap smear    Over 18yrs ago  . ALKALINE PHOSPHATASE, ELEVATED 07/01/2009    Qualifier: Diagnosis of  By: [name] MD, [name]   . Allergic rhinitis   . Anemia   . Arthritis   . Asthma   . Cough    occasional  . Cystocele 11/05/08  . Diverticulosis of colon   . DJD (degenerative joint disease)    no per pt  . Elevated alkaline phosphatase level   . GERD (gastroesophageal reflux disease)   . H/O urinary incontinence 11/06/2008  . H/O varicella   . Hard measles   . Hx of colonic polyps   . Hypercholesterolemia   . Hypertension   . Leg swelling    occasional  . 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 Overweight(278.02)   . Rectocele 11/06/2008   Large  . Sebaceous cyst    on back  . Sinusitis   . Venous insufficiency   . Wheezing    occasional  . Yeast infection     Past Surgical History:  Procedure Laterality Date  . ABDOMINAL HYSTERECTOMY  1988  . ANTERIOR AND POSTERIOR REPAIR  11/05/08  . APOGEE / PERIGEE REPAIR  10/2008   Dr. Octavio Manns  . bladder tack  2011  . BREAST SURGERY  1978   cyst removed from both removed  . COLONOSCOPY    . ESOPHAGOGASTRODUODENOSCOPY      Family History  Problem Relation Age of Onset  . Aneurysm Mother   . Lung cancer Father   . Hypertension Daughter   . Hypertension Daughter   . Colon cancer Neg Hx   . Esophageal cancer Neg Hx   . Stomach cancer Neg Hx   . Rectal cancer Neg Hx   . Colon polyps Neg Hx    Family Status  Relation Status  . Mother Deceased at age 10   stroke  . Father Deceased at age 11   cancer  . Daughter Alive  . Daughter Alive  . Neg Hx     shoulder  Social History   Social History  . Marital status: Married    Spouse name: [name]  . Number of children: 2  . Years of education: N/A   Occupational History  . LPN     Social History Main Topics  . Smoking status: Never Smoker  . Smokeless tobacco: Never Used  . Alcohol use Yes     Comment: social use  . Drug use: No  . Sexual activity: Not on file   Other Topics Concern  . Not on file   Social History  Narrative   Married   Never smoked   Alcohol few beers on week-end   Exercise walk 5 days a week for one hour   No Advance Directive     Allergies  Allergen Reactions  . Demerol Hives    All over the body   . Lisinopril-Hydrochlorothiazide Hives    All over the body  .  Penicillins Hives    All over the body    Allergies as of 09/09/2016      Reactions   Demerol Hives   All over the body    Lisinopril-hydrochlorothiazide Hives   All over the body   Penicillins Hives   All over the body      Medication List       Accurate as of 09/09/16 10:54 AM. Always use your most recent med list.          albuterol 108 (90 Base) MCG/ACT inhaler Commonly known as:  PROVENTIL HFA;VENTOLIN HFA Inhale 2 puffs into the lungs every 6 (six) hours as needed.   budesonide-formoterol 160-4.5 MCG/ACT inhaler Commonly known as:  SYMBICORT Inhale 2 puffs into the lungs 2 (two) times daily.   budesonide-formoterol 160-4.5 MCG/ACT inhaler Commonly known as:  SYMBICORT Inhale 2 puffs into the lungs 2 (two) times daily.   CALCIUM 600 + D PO Take by mouth. Take one tablet daily   chlorpheniramine-HYDROcodone 10-8 MG/5ML Suer Commonly known as:  TUSSIONEX PENNKINETIC ER Take 5 mLs by mouth every 12 (twelve) hours as needed for cough.   FISH OIL PO Take by mouth daily.   fluticasone 50 MCG/ACT nasal spray Commonly known as:  FLONASE 2 sprays daily.   furosemide 40 MG tablet Commonly known as:  LASIX TAKE 1 TABLET (40 MG TOTAL) BY MOUTH DAILY.   KLOR-CON M20 20 MEQ tablet Generic drug:  potassium chloride SA TAKE 2 TABLETS BY MOUTH DAILY FOR POTASSIUM SUPPLEMENT   levocetirizine 5 MG tablet Commonly known as:  XYZAL Take 5 mg by mouth every evening.   losartan 25 MG tablet Commonly known as:  COZAAR Take 25 mg by mouth daily. Along with 50 mg tablet   losartan 50 MG tablet Commonly known as:  COZAAR TAKE ONE TABLET BY MOUTH ONCE DAILY FOR BLOOD PRESSURE   MACULAR VITAMIN  BENEFIT PO Take 1 capsule by mouth 2 (two) times daily.   montelukast 10 MG tablet Commonly known as:  SINGULAIR Take 10 mg by mouth at bedtime.   NIFEdipine 60 MG 24 hr tablet Commonly known as:  PROCARDIA XL/ADALAT-CC TAKE 1 TABLET BY MOUTH DAILY   NON FORMULARY Allergy shots once weekly   pantoprazole 40 MG tablet Commonly known as:  PROTONIX Take one tablet by mouth twice daily for stomach   pravastatin 40 MG tablet Commonly known as:  PRAVACHOL TAKE 1 TABLET BY MOUTH EVERY DAY   ranitidine 300 MG tablet Commonly known as:  ZANTAC Take 300 mg by mouth at bedtime.   SPIRIVA HANDIHALER 18 MCG inhalation capsule Generic drug:  tiotropium 18 mcg 2 (two) times daily.        Review of Systems:  Review of Systems  HENT: Positive for trouble swallowing.   Respiratory: Positive for cough.   Musculoskeletal: Positive for arthralgias.  Allergic/Immunologic: Positive for environmental allergies.  All other systems reviewed and are negative.   Physical Exam: Vitals:   09/09/16 1012  BP: 132/70  Pulse: 87  Temp: 98 F (36.7 C)  TempSrc: Oral  SpO2: 98%  Weight: 222 lb 12.8 oz (101.1 kg)  Height: 5\' 2"  (1.575 m)   Body mass index is 40.75 kg/m. Physical Exam  Constitutional: She is oriented to person, place, and time. She appears well-developed and well-nourished. No distress.  Looks well  HENT:  Mouth/Throat: Oropharynx is clear and moist. No oropharyngeal exudate.  Voice slightly raspy  Eyes: Pupils are equal, round, and reactive to  light. No scleral icterus.  Neck: Neck supple. Carotid bruit is not present. No tracheal deviation present. No thyromegaly present.  Cardiovascular: Normal rate, regular rhythm, normal heart sounds and intact distal pulses.  Exam reveals no gallop and no friction rub.   No murmur heard. +1 pitting LE edema b/l. no calf TTP.  Varicose veins palpable b/l.  Pulmonary/Chest: Effort normal and breath sounds normal. No stridor. No  respiratory distress. She has no wheezes. She has no rales.  Abdominal: Soft. Bowel sounds are normal. She exhibits no distension and no mass. There is no hepatomegaly. There is no tenderness. There is no rebound and no guarding.  Genitourinary: Vagina normal.  Musculoskeletal: She exhibits edema and tenderness.  L>R knee swelling with TTP and reduced flexion  Lymphadenopathy:    She has no cervical adenopathy.  Neurological: She is alert and oriented to person, place, and time.  Gait antalgic  Skin: Skin is warm and dry. No rash noted.  Multiple nevi but no dysplastic appearing nevi; multiple subcut sebaceous cysts but no secondary signs of infection  Psychiatric: She has a normal mood and affect. Her behavior is normal. Judgment and thought content normal.    Labs reviewed:  Basic Metabolic Panel:  Recent Labs  12/30/15 0816 05/04/16 0818  NA 141 141  K 4.3 4.2  CL 103 109  CO2 23 25  GLUCOSE 90 93  BUN 11 16  CREATININE 0.83 0.87  CALCIUM 9.6 9.8   Liver Function Tests:  Recent Labs  12/30/15 0816 05/04/16 0818  AST 25  --   ALT 19 15  ALKPHOS 165*  --   BILITOT 0.4  --   PROT 7.2  --   ALBUMIN 4.2  --    No results for input(s): LIPASE, AMYLASE in the last 8760 hours. No results for input(s): AMMONIA in the last 8760 hours. CBC: No results for input(s): WBC, NEUTROABS, HGB, HCT, MCV, PLT in the last 8760 hours. Lipid Panel:  Recent Labs  12/30/15 0816 05/04/16 0818  CHOL 169 171  HDL 61 62  LDLCALC 96 96  TRIG 60 65  CHOLHDL 2.8 2.8   Lab Results  Component Value Date   HGBA1C 5.6 05/26/2014    Procedures: No results found. NSR @ 84 bpm, LAD, LAE, poor R wave progression. No acute ischemic changes. No change since 05/2015  Assessment/Plan   ICD-9-CM ICD-10-CM   1. Well adult exam V70.0 Z00.00   2. Chronic cough 786.2 R05 chlorpheniramine-HYDROcodone (TUSSIONEX PENNKINETIC ER) 10-8 MG/5ML SUER  3. Cough 786.2 R05 chlorpheniramine-HYDROcodone  (TUSSIONEX PENNKINETIC ER) 10-8 MG/5ML SUER  4. 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 Essential hypertension 401.9 I10   6. Hyperlipidemia LDL goal <130 272.4 E78.5   7. Osteopenia, unspecified location 733.90 M85.80 DG BONE DENSITY (DXA)  8. Estrogen deficiency 256.39 E28.39 DG BONE DENSITY (DXA)      Pt is UTD on health maintenance. She is due for mammogram in 2018. Vaccinations are UTD. Pt maintains a healthy lifestyle. Encouraged pt to exercise 30-45 minutes 4-5 times per week. Eat a well balanced diet. Avoid smoking. Limit alcohol intake. Wear seatbelt when riding in the car. Wear sun block (SPF >50) when spending extended times outside.  She will have labs drawn today  Follow up with specialists as scheduled  Continue current medications as ordered  Will call with lab results  Will call with bone density results  Follow up in 4 mos for routine visit.  Wendy Okeefe S.  Perlie Gold  [facility] [address] [place], [place] [address] [phone] Cell (Monday-Friday 8 AM - 5 PM) [phone] After 5 PM and follow prompts

## 2016-09-11 ENCOUNTER — Other Ambulatory Visit: Payer: Self-pay | Admitting: Internal Medicine

## 2016-09-12 DIAGNOSIS — Z961 Presence of intraocular lens: Secondary | ICD-10-CM | POA: Diagnosis not present

## 2016-09-12 DIAGNOSIS — H40023 Open angle with borderline findings, high risk, bilateral: Secondary | ICD-10-CM | POA: Diagnosis not present

## 2016-09-12 DIAGNOSIS — H3509 Other intraretinal microvascular abnormalities: Secondary | ICD-10-CM | POA: Diagnosis not present

## 2016-09-12 DIAGNOSIS — H04123 Dry eye syndrome of bilateral lacrimal glands: Secondary | ICD-10-CM | POA: Diagnosis not present

## 2016-09-16 DIAGNOSIS — J301 Allergic rhinitis due to pollen: Secondary | ICD-10-CM | POA: Diagnosis not present

## 2016-09-16 DIAGNOSIS — J3089 Other allergic rhinitis: Secondary | ICD-10-CM | POA: Diagnosis not present

## 2016-09-16 DIAGNOSIS — J3081 Allergic rhinitis due to animal (cat) (dog) hair and dander: Secondary | ICD-10-CM | POA: Diagnosis not present

## 2016-09-30 DIAGNOSIS — J3081 Allergic rhinitis due to animal (cat) (dog) hair and dander: Secondary | ICD-10-CM | POA: Diagnosis not present

## 2016-09-30 DIAGNOSIS — J3089 Other allergic rhinitis: Secondary | ICD-10-CM | POA: Diagnosis not present

## 2016-09-30 DIAGNOSIS — J301 Allergic rhinitis due to pollen: Secondary | ICD-10-CM | POA: Diagnosis not present

## 2016-10-07 DIAGNOSIS — Z1231 Encounter for screening mammogram for malignant neoplasm of breast: Secondary | ICD-10-CM | POA: Diagnosis not present

## 2016-10-07 DIAGNOSIS — M8589 Other specified disorders of bone density and structure, multiple sites: Secondary | ICD-10-CM | POA: Diagnosis not present

## 2016-10-07 LAB — HM DEXA SCAN

## 2016-10-07 LAB — HM MAMMOGRAPHY

## 2016-10-11 DIAGNOSIS — R1312 Dysphagia, oropharyngeal phase: Secondary | ICD-10-CM | POA: Diagnosis not present

## 2016-10-11 DIAGNOSIS — R633 Feeding difficulties: Secondary | ICD-10-CM | POA: Diagnosis not present

## 2016-10-11 DIAGNOSIS — R490 Dysphonia: Secondary | ICD-10-CM | POA: Diagnosis not present

## 2016-10-11 DIAGNOSIS — R1310 Dysphagia, unspecified: Secondary | ICD-10-CM | POA: Diagnosis not present

## 2016-10-11 DIAGNOSIS — R1314 Dysphagia, pharyngoesophageal phase: Secondary | ICD-10-CM | POA: Diagnosis not present

## 2016-10-11 DIAGNOSIS — R05 Cough: Secondary | ICD-10-CM | POA: Diagnosis not present

## 2016-10-11 DIAGNOSIS — K219 Gastro-esophageal reflux disease without esophagitis: Secondary | ICD-10-CM | POA: Diagnosis not present

## 2016-10-11 DIAGNOSIS — F458 Other somatoform disorders: Secondary | ICD-10-CM | POA: Diagnosis not present

## 2016-10-13 ENCOUNTER — Encounter: Payer: Self-pay | Admitting: *Deleted

## 2016-10-14 DIAGNOSIS — J3081 Allergic rhinitis due to animal (cat) (dog) hair and dander: Secondary | ICD-10-CM | POA: Diagnosis not present

## 2016-10-14 DIAGNOSIS — J3089 Other allergic rhinitis: Secondary | ICD-10-CM | POA: Diagnosis not present

## 2016-10-14 DIAGNOSIS — J301 Allergic rhinitis due to pollen: Secondary | ICD-10-CM | POA: Diagnosis not present

## 2016-10-18 ENCOUNTER — Telehealth: Payer: Self-pay | Admitting: *Deleted

## 2016-10-18 NOTE — Telephone Encounter (Signed)
Received Results from [facility] [phone] of patient's Bone Density: Per Dr. Genoveva Ill patient she has Osteopenia and has increased risk of fragility fracture. Recommend low impact exercise; Vitamin D/Calcium supplement daily."  Called patient and No Answer. [phone] will try again later.

## 2016-10-21 NOTE — Telephone Encounter (Signed)
Patient notified and agreed.  

## 2016-10-26 ENCOUNTER — Ambulatory Visit (INDEPENDENT_AMBULATORY_CARE_PROVIDER_SITE_OTHER): Payer: PPO | Admitting: Podiatry

## 2016-10-26 ENCOUNTER — Encounter: Payer: Self-pay | Admitting: Podiatry

## 2016-10-26 VITALS — BP 149/83 | HR 87 | Resp 18

## 2016-10-26 DIAGNOSIS — M79676 Pain in unspecified toe(s): Secondary | ICD-10-CM | POA: Diagnosis not present

## 2016-10-26 DIAGNOSIS — B351 Tinea unguium: Secondary | ICD-10-CM

## 2016-10-26 NOTE — Progress Notes (Signed)
Patient ID: Wendy Figueroa, female   DOB: 10-09-1945, 71 y.o.   MRN: [id number]    Subjective: This patient presents again for schedule visit complaining of elongated and thickened toenails which are on call for walking wearing shoes and she requests toenail debridement  Objective: Orientated 3 Nonpitting edema left lower extremity, with long history DP and PT pulses 2/4 bilaterally Capillary reflex immediate bilaterally Sensation to 10 g monofilament wire intact 5/5 bilaterally Vibratory sensation reactive bilaterally Ankle reflex equal and reactive bilaterally HAV bilaterally Hammertoe second bilaterally Manual motor testing dorsi flexion, plantar flexion, inversion, eversion 5/5 bilaterally No open skin lesions bilaterally  small keratoses fifth right toe The toenails are brittle, deformed, discolored, hypertrophic and tender direct palpation 6-10   Assessment: Symptomatic onychomycoses 6-10  Plan: Debrided toenails 6-10 mechanically and electronically without any bleeding Debrided keratoses 1 without any bleeding  Reappoint 3 months

## 2016-10-28 DIAGNOSIS — J3081 Allergic rhinitis due to animal (cat) (dog) hair and dander: Secondary | ICD-10-CM | POA: Diagnosis not present

## 2016-10-28 DIAGNOSIS — J3089 Other allergic rhinitis: Secondary | ICD-10-CM | POA: Diagnosis not present

## 2016-10-28 DIAGNOSIS — J301 Allergic rhinitis due to pollen: Secondary | ICD-10-CM | POA: Diagnosis not present

## 2016-11-11 DIAGNOSIS — J3081 Allergic rhinitis due to animal (cat) (dog) hair and dander: Secondary | ICD-10-CM | POA: Diagnosis not present

## 2016-11-11 DIAGNOSIS — J3089 Other allergic rhinitis: Secondary | ICD-10-CM | POA: Diagnosis not present

## 2016-11-11 DIAGNOSIS — J301 Allergic rhinitis due to pollen: Secondary | ICD-10-CM | POA: Diagnosis not present

## 2016-11-21 DIAGNOSIS — J45909 Unspecified asthma, uncomplicated: Secondary | ICD-10-CM | POA: Diagnosis not present

## 2016-11-21 DIAGNOSIS — R490 Dysphonia: Secondary | ICD-10-CM | POA: Diagnosis not present

## 2016-11-21 DIAGNOSIS — K219 Gastro-esophageal reflux disease without esophagitis: Secondary | ICD-10-CM | POA: Diagnosis not present

## 2016-11-21 DIAGNOSIS — I10 Essential (primary) hypertension: Secondary | ICD-10-CM | POA: Diagnosis not present

## 2016-11-21 DIAGNOSIS — Z888 Allergy status to other drugs, medicaments and biological substances status: Secondary | ICD-10-CM | POA: Diagnosis not present

## 2016-11-21 DIAGNOSIS — Z880 Allergy status to penicillin: Secondary | ICD-10-CM | POA: Diagnosis not present

## 2016-11-21 DIAGNOSIS — R1312 Dysphagia, oropharyngeal phase: Secondary | ICD-10-CM | POA: Diagnosis not present

## 2016-11-21 DIAGNOSIS — R1310 Dysphagia, unspecified: Secondary | ICD-10-CM | POA: Diagnosis not present

## 2016-11-25 DIAGNOSIS — J3089 Other allergic rhinitis: Secondary | ICD-10-CM | POA: Diagnosis not present

## 2016-11-25 DIAGNOSIS — J3081 Allergic rhinitis due to animal (cat) (dog) hair and dander: Secondary | ICD-10-CM | POA: Diagnosis not present

## 2016-11-25 DIAGNOSIS — J301 Allergic rhinitis due to pollen: Secondary | ICD-10-CM | POA: Diagnosis not present

## 2016-12-04 ENCOUNTER — Other Ambulatory Visit: Payer: Self-pay | Admitting: Internal Medicine

## 2016-12-07 ENCOUNTER — Other Ambulatory Visit: Payer: Self-pay | Admitting: *Deleted

## 2016-12-07 DIAGNOSIS — R05 Cough: Secondary | ICD-10-CM

## 2016-12-07 DIAGNOSIS — R059 Cough, unspecified: Secondary | ICD-10-CM

## 2016-12-07 DIAGNOSIS — R053 Chronic cough: Secondary | ICD-10-CM

## 2016-12-07 MED ORDER — HYDROCOD POLST-CPM POLST ER 10-8 MG/5ML PO SUER: 5.0000 mL | Freq: Two times a day (BID) | ORAL | 0 refills | Status: DC | PRN

## 2016-12-07 NOTE — Telephone Encounter (Signed)
Patient requested and will pick up 

## 2016-12-09 DIAGNOSIS — J3089 Other allergic rhinitis: Secondary | ICD-10-CM | POA: Diagnosis not present

## 2016-12-09 DIAGNOSIS — J301 Allergic rhinitis due to pollen: Secondary | ICD-10-CM | POA: Diagnosis not present

## 2016-12-09 DIAGNOSIS — J3081 Allergic rhinitis due to animal (cat) (dog) hair and dander: Secondary | ICD-10-CM | POA: Diagnosis not present

## 2016-12-22 DIAGNOSIS — M810 Age-related osteoporosis without current pathological fracture: Secondary | ICD-10-CM | POA: Diagnosis not present

## 2016-12-22 DIAGNOSIS — Z6841 Body Mass Index (BMI) 40.0 and over, adult: Secondary | ICD-10-CM | POA: Diagnosis not present

## 2016-12-22 DIAGNOSIS — Z01419 Encounter for gynecological examination (general) (routine) without abnormal findings: Secondary | ICD-10-CM | POA: Diagnosis not present

## 2016-12-23 ENCOUNTER — Other Ambulatory Visit: Payer: Self-pay | Admitting: Internal Medicine

## 2016-12-23 DIAGNOSIS — J3081 Allergic rhinitis due to animal (cat) (dog) hair and dander: Secondary | ICD-10-CM | POA: Diagnosis not present

## 2016-12-23 DIAGNOSIS — J301 Allergic rhinitis due to pollen: Secondary | ICD-10-CM | POA: Diagnosis not present

## 2016-12-23 DIAGNOSIS — J3089 Other allergic rhinitis: Secondary | ICD-10-CM | POA: Diagnosis not present

## 2016-12-29 DIAGNOSIS — R05 Cough: Secondary | ICD-10-CM | POA: Diagnosis not present

## 2016-12-29 DIAGNOSIS — R1312 Dysphagia, oropharyngeal phase: Secondary | ICD-10-CM | POA: Diagnosis not present

## 2016-12-29 DIAGNOSIS — R490 Dysphonia: Secondary | ICD-10-CM | POA: Diagnosis not present

## 2016-12-29 DIAGNOSIS — R1310 Dysphagia, unspecified: Secondary | ICD-10-CM | POA: Diagnosis not present

## 2016-12-29 DIAGNOSIS — K219 Gastro-esophageal reflux disease without esophagitis: Secondary | ICD-10-CM | POA: Diagnosis not present

## 2016-12-29 DIAGNOSIS — F458 Other somatoform disorders: Secondary | ICD-10-CM | POA: Diagnosis not present

## 2016-12-29 DIAGNOSIS — K224 Dyskinesia of esophagus: Secondary | ICD-10-CM | POA: Diagnosis not present

## 2016-12-29 DIAGNOSIS — K449 Diaphragmatic hernia without obstruction or gangrene: Secondary | ICD-10-CM | POA: Diagnosis not present

## 2016-12-29 DIAGNOSIS — R1314 Dysphagia, pharyngoesophageal phase: Secondary | ICD-10-CM | POA: Diagnosis not present

## 2017-01-06 DIAGNOSIS — J3089 Other allergic rhinitis: Secondary | ICD-10-CM | POA: Diagnosis not present

## 2017-01-06 DIAGNOSIS — J3081 Allergic rhinitis due to animal (cat) (dog) hair and dander: Secondary | ICD-10-CM | POA: Diagnosis not present

## 2017-01-06 DIAGNOSIS — J301 Allergic rhinitis due to pollen: Secondary | ICD-10-CM | POA: Diagnosis not present

## 2017-01-10 ENCOUNTER — Other Ambulatory Visit: Payer: Self-pay | Admitting: Internal Medicine

## 2017-01-11 ENCOUNTER — Encounter: Payer: Self-pay | Admitting: Internal Medicine

## 2017-01-11 ENCOUNTER — Ambulatory Visit (INDEPENDENT_AMBULATORY_CARE_PROVIDER_SITE_OTHER): Payer: PPO | Admitting: Internal Medicine

## 2017-01-11 VITALS — BP 136/84 | HR 86 | Temp 98.3°F | Ht 62.0 in | Wt 220.6 lb

## 2017-01-11 DIAGNOSIS — K219 Gastro-esophageal reflux disease without esophagitis: Secondary | ICD-10-CM | POA: Diagnosis not present

## 2017-01-11 DIAGNOSIS — R490 Dysphonia: Secondary | ICD-10-CM

## 2017-01-11 DIAGNOSIS — I10 Essential (primary) hypertension: Secondary | ICD-10-CM

## 2017-01-11 DIAGNOSIS — E876 Hypokalemia: Secondary | ICD-10-CM

## 2017-01-11 DIAGNOSIS — Z79899 Other long term (current) drug therapy: Secondary | ICD-10-CM

## 2017-01-11 DIAGNOSIS — E785 Hyperlipidemia, unspecified: Secondary | ICD-10-CM | POA: Diagnosis not present

## 2017-01-11 DIAGNOSIS — R600 Localized edema: Secondary | ICD-10-CM | POA: Diagnosis not present

## 2017-01-11 DIAGNOSIS — R05 Cough: Secondary | ICD-10-CM

## 2017-01-11 DIAGNOSIS — J3089 Other allergic rhinitis: Secondary | ICD-10-CM | POA: Diagnosis not present

## 2017-01-11 DIAGNOSIS — R053 Chronic cough: Secondary | ICD-10-CM

## 2017-01-11 MED ORDER — TETANUS-DIPHTH-ACELL PERTUSSIS 5-2.5-18.5 LF-MCG/0.5 IM SUSP: 0.5000 mL | Freq: Once | INTRAMUSCULAR | 0 refills | Status: AC

## 2017-01-11 MED ORDER — ZOSTER VAC RECOMB ADJUVANTED 50 MCG/0.5ML IM SUSR: 0.5000 mL | Freq: Once | INTRAMUSCULAR | 1 refills | Status: AC

## 2017-01-11 NOTE — Progress Notes (Signed)
Patient ID: Wendy Figueroa, female   DOB: 1946/02/25, 71 y.o.   MRN: [id number]    Location:  [facility] Place of Service: OFFICE  Chief Complaint  Patient presents with  . Medical Management of Chronic Issues    4 month routine visit    HPI:  71 yo female seen today for f/u. She continues to cough despite cricopharyngeal dilation by ENT Dr Rowe Clack on 11/21/16. She was recently Rx gabapentin up to '300mg'$  qhs. She is sleeping better. She uses Tussionex prn. She states she only takes it when she is sitting in prolonged meeting, church or sitting in lobby for prolonged period. Hoarseness improved. She has a dry mouth and is using mouth rinse  Hx dysphagia/cough - She had an upper esophageal sphincter balloon dilation and suspension microdirect laryngoscopy on 04/18/16 by Dr Rowe Clack at [facility]. She feels that she gets strangled at times. Dysphagia improved a little. Takes protonix BID for GERD.  She is able to swallow potassium tabs occasionally with difficulty.   HTN - BP stable on on losartan 75 mg daily, procardia XL and lasix  Asthma - stable on HFA prn ( has not needed it in "quite a while"), spiriva daily, singulair daily and symbicort every other day. No asthma exacerbations/attacks. She also takes xyzal and flonase for allergic rhinitis.  She gets allergy shots weekly. Followed by pulmonary  Hyperlipidemia - stable; takes pravachol. LDL 96  Arthritis - stable  Past Medical History:  Diagnosis Date  . Abnormal Pap smear    Over 11yr ago  . ALKALINE PHOSPHATASE, ELEVATED 07/01/2009   Qualifier: Diagnosis of  By: NLenna GilfordMD, SDeborra Medina  . Allergic rhinitis   . Anemia   . Arthritis   . Asthma   . Cough    occasional  . Cystocele 11/05/08  . Diverticulosis of colon   . DJD (degenerative joint disease)    no per pt  . Elevated alkaline phosphatase level   . GERD (gastroesophageal reflux disease)   . H/O urinary incontinence 11/06/2008  . H/O varicella   . Hard measles   . Hx of  colonic polyps   . Hypercholesterolemia   . Hypertension   . Leg swelling    occasional  . 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 Overweight(278.02)   . Rectocele 11/06/2008   Large  . Sebaceous cyst    on back  . Sinusitis   . Venous insufficiency   . Wheezing    occasional  . Yeast infection     Past Surgical History:  Procedure Laterality Date  . ABDOMINAL HYSTERECTOMY  1988  . ANTERIOR AND POSTERIOR REPAIR  11/05/08  . APOGEE / PERIGEE REPAIR  10/2008   Dr. AOctavio Manns . bladder tack  2011  . BREAST SURGERY  1978   cyst removed from both removed  . COLONOSCOPY    . ESOPHAGOGASTRODUODENOSCOPY      Patient Care Team: CGildardo Cranker DO as PCP - General (Internal Medicine) HMonna Fam MD as Consulting Physician (Ophthalmology)  Social History   Social History  . Marital status: Married    Spouse name: SEbony Hail . Number of children: 2  . Years of education: N/A   Occupational History  . LPN     Social History Main Topics  . Smoking status: Never Smoker  . Smokeless tobacco: Never Used  . Alcohol use Yes     Comment: social use  . Drug use: No  . Sexual activity: Not  on file   Other Topics Concern  . Not on file   Social History Narrative   Married   Never smoked   Alcohol few beers on week-end   Exercise walk 5 days a week for one hour   No Advance Directive      reports that she has never smoked. She has never used smokeless tobacco. She reports that she drinks alcohol. She reports that she does not use drugs.  Family History  Problem Relation Age of Onset  . Aneurysm Mother   . Lung cancer Father   . Hypertension Daughter   . Hypertension Daughter   . Colon cancer Neg Hx   . Esophageal cancer Neg Hx   . Stomach cancer Neg Hx   . Rectal cancer Neg Hx   . Colon polyps Neg Hx    Family Status  Relation Status  . Mother Deceased at age 31       stroke  . Father Deceased at age 73       cancer  . Daughter Alive  .  Daughter Alive  . Neg Hx (Not Specified)     Allergies  Allergen Reactions  . Demerol Hives    All over the body   . Lisinopril-Hydrochlorothiazide Hives    All over the body  . Penicillins Hives    All over the body    Medications: Patient's Medications  New Prescriptions   No medications on file  Previous Medications   BUDESONIDE-FORMOTEROL (SYMBICORT) 160-4.5 MCG/ACT INHALER    Inhale 2 puffs into the lungs 2 (two) times daily.     BUDESONIDE-FORMOTEROL (SYMBICORT) 160-4.5 MCG/ACT INHALER    Inhale 2 puffs into the lungs 2 (two) times daily.   CALCIUM CARB-CHOLECALCIFEROL (CALCIUM 600 + D PO)    Take by mouth. Take one tablet daily   CHLORPHENIRAMINE-HYDROCODONE (TUSSIONEX PENNKINETIC ER) 10-8 MG/5ML SUER    Take 5 mLs by mouth every 12 (twelve) hours as needed for cough.   FLUTICASONE (FLONASE) 50 MCG/ACT NASAL SPRAY    2 sprays daily.   FUROSEMIDE (LASIX) 40 MG TABLET    TAKE 1 TABLET (40 MG TOTAL) BY MOUTH DAILY.   GABAPENTIN (NEURONTIN) 100 MG CAPSULE    Take 100 mg by mouth at bedtime.   KLOR-CON M20 20 MEQ TABLET    TAKE 2 TABLETS EVERY DAY FOR POTASSIUM SUPPLEMENT   LEVOCETIRIZINE (XYZAL) 5 MG TABLET    Take 5 mg by mouth every evening.     LOSARTAN (COZAAR) 25 MG TABLET    Take 25 mg by mouth daily.   LOSARTAN (COZAAR) 50 MG TABLET    TAKE 1 TABLET EVERY DAY FOR BLOOD PRESSURE   MONTELUKAST (SINGULAIR) 10 MG TABLET    Take 10 mg by mouth at bedtime.     MULTIPLE VITAMINS-MINERALS (MACULAR VITAMIN BENEFIT PO)    Take 1 capsule by mouth 2 (two) times daily.     NIFEDIPINE (PROCARDIA XL/ADALAT-CC) 60 MG 24 HR TABLET    TAKE 1 TABLET EVERY DAY   NON FORMULARY    Allergy shots once weekly   OMEGA-3 FATTY ACIDS (FISH OIL PO)    Take by mouth daily.   PANTOPRAZOLE (PROTONIX) 40 MG TABLET    Take one tablet by mouth twice daily for stomach   PRAVASTATIN (PRAVACHOL) 40 MG TABLET    TAKE 1 TABLET BY MOUTH EVERY DAY   RANITIDINE (ZANTAC) 300 MG TABLET    Take 300 mg by mouth at  bedtime.  SPIRIVA HANDIHALER 18 MCG INHALATION CAPSULE    18 mcg 2 (two) times daily.  Modified Medications   Modified Medication Previous Medication   TDAP (BOOSTRIX) 5-2.5-18.5 LF-MCG/0.5 INJECTION Tdap (BOOSTRIX) 5-2.5-18.5 LF-MCG/0.5 injection      Inject 0.5 mLs into the muscle once.    Inject 0.5 mLs into the muscle once.   ZOSTER VAC RECOMB ADJUVANTED (SHINGRIX) INJECTION Zoster Vac Recomb Adjuvanted (SHINGRIX) injection      Inject 0.5 mLs into the muscle once.    Inject 0.5 mLs into the muscle once.  Discontinued Medications   ALBUTEROL (PROVENTIL HFA;VENTOLIN HFA) 108 (90 BASE) MCG/ACT INHALER    Inhale 2 puffs into the lungs every 6 (six) hours as needed.     LOSARTAN (COZAAR) 25 MG TABLET    Take 25 mg by mouth daily. Along with 50 mg tablet   LOSARTAN (COZAAR) 50 MG TABLET    TAKE ONE TABLET BY MOUTH ONCE DAILY FOR BLOOD PRESSURE    Review of Systems  HENT: Positive for trouble swallowing and voice change.        Dry mouth  Respiratory: Positive for cough.   Musculoskeletal: Positive for arthralgias.  Allergic/Immunologic: Positive for environmental allergies.  All other systems reviewed and are negative.   Vitals:   01/11/17 0929  BP: 136/84  Pulse: 86  Temp: 98.3 F (36.8 C)  TempSrc: Oral  SpO2: 98%  Weight: 220 lb 9.6 oz (100.1 kg)  Height: '5\' 2"'$  (1.575 m)   Body mass index is 40.35 kg/m.  Physical Exam  Constitutional: She is oriented to person, place, and time. She appears well-developed and well-nourished. No distress.  Looks well  HENT:  Mouth/Throat: Oropharynx is clear and moist. No oropharyngeal exudate.  Voice slightly raspy  Eyes: Pupils are equal, round, and reactive to light. No scleral icterus.  Neck: Neck supple. Carotid bruit is not present. No tracheal deviation present. No thyromegaly present.  Cardiovascular: Normal rate, regular rhythm, normal heart sounds and intact distal pulses.  Exam reveals no gallop and no friction rub.   No  murmur heard. +1 pitting LE edema b/l. no calf TTP.  Varicose veins palpable b/l.  Pulmonary/Chest: Effort normal and breath sounds normal. No stridor. No respiratory distress. She has no wheezes. She has no rales.  Abdominal: Soft. Bowel sounds are normal. She exhibits no distension and no mass. There is no hepatomegaly. There is no tenderness. There is no rebound and no guarding.  Genitourinary: Vagina normal.  Musculoskeletal: She exhibits edema and tenderness.  L>R knee swelling with TTP and reduced flexion  Lymphadenopathy:    She has no cervical adenopathy.  Neurological: She is alert and oriented to person, place, and time.  Gait antalgic  Skin: Skin is warm and dry. No rash noted.  Multiple nevi but no dysplastic appearing nevi; multiple subcut sebaceous cysts but no secondary signs of infection  Psychiatric: She has a normal mood and affect. Her behavior is normal. Judgment and thought content normal.     Labs reviewed: No visits with results within 3 Month(s) from this visit.  Latest known visit with results is:  Abstract on 10/13/2016  Component Date Value Ref Range Status  . HM Dexa Scan 10/07/2016 [facility]:Osteopenia   Final    No results found.   Assessment/Plan   ICD-9-CM ICD-10-CM   1. Chronic cough - failing to change as expected 786.2 R05   2. Other allergic rhinitis 477.8 J30.89   3. LPRD (laryngopharyngeal reflux disease) - improving 478.79  K21.9   4. Essential hypertension 401.9 I10 CMP with eGFR  5. Dysphonia - improving 784.42 R49.0   6. Lower extremity edema 782.3 R60.0 CMP with eGFR  7. Hypokalemia 276.8 E87.6 CMP with eGFR  8. Hyperlipidemia LDL goal <130 272.4 E78.5 Lipid Panel  9. High risk medication use V58.69 Z79.899 CMP with eGFR   Increase gabapentin 261m (take 2 tabs of 1058m nightly  Continue other medications as ordered  Return to office in next 1-2 weeks for fasting labs  Follow up in 4 mos for HTN, cough, acid reflux.   Ozias Dicenzo S.  CaPerlie GoldPiMercy Medical [facility] [address] [phone] (Monday-Friday 8 AM - 5 PM) ([phone] 5 PM and follow prompts

## 2017-01-11 NOTE — Patient Instructions (Addendum)
Increase gabapentin 200mg  (take 2 tabs of 100mg ) nightly  Continue other medications as ordered  Return to office in next 1-2 weeks for fasting labs  Follow up in 4 mos for HTN, cough, acid reflux.

## 2017-01-12 ENCOUNTER — Other Ambulatory Visit: Payer: Self-pay | Admitting: Internal Medicine

## 2017-01-17 ENCOUNTER — Telehealth: Payer: Self-pay | Admitting: Internal Medicine

## 2017-01-17 NOTE — Telephone Encounter (Signed)
Called 01/17/17 and left msg asking pt if we can complete AWV-I after labs tomorrow. VDM (DD)

## 2017-01-18 ENCOUNTER — Ambulatory Visit (INDEPENDENT_AMBULATORY_CARE_PROVIDER_SITE_OTHER): Payer: PPO

## 2017-01-18 ENCOUNTER — Other Ambulatory Visit: Payer: PPO

## 2017-01-18 ENCOUNTER — Other Ambulatory Visit: Payer: Self-pay | Admitting: *Deleted

## 2017-01-18 VITALS — BP 158/76 | HR 98 | Temp 98.5°F | Ht 62.0 in | Wt 220.0 lb

## 2017-01-18 DIAGNOSIS — E876 Hypokalemia: Secondary | ICD-10-CM

## 2017-01-18 DIAGNOSIS — E785 Hyperlipidemia, unspecified: Secondary | ICD-10-CM

## 2017-01-18 DIAGNOSIS — Z79899 Other long term (current) drug therapy: Secondary | ICD-10-CM | POA: Diagnosis not present

## 2017-01-18 DIAGNOSIS — R600 Localized edema: Secondary | ICD-10-CM | POA: Diagnosis not present

## 2017-01-18 DIAGNOSIS — Z0000 Encounter for general adult medical examination without abnormal findings: Secondary | ICD-10-CM

## 2017-01-18 DIAGNOSIS — I10 Essential (primary) hypertension: Secondary | ICD-10-CM | POA: Diagnosis not present

## 2017-01-18 LAB — COMPLETE METABOLIC PANEL WITH GFR
ALT: 14 U/L (ref 6–29)
AST: 20 U/L (ref 10–35)
Albumin: 4.1 g/dL (ref 3.6–5.1)
Alkaline Phosphatase: 167 U/L — ABNORMAL HIGH (ref 33–130)
BUN: 11 mg/dL (ref 7–25)
CHLORIDE: 107 mmol/L (ref 98–110)
CO2: 28 mmol/L (ref 20–31)
CREATININE: 0.89 mg/dL (ref 0.60–0.93)
Calcium: 9.8 mg/dL (ref 8.6–10.4)
GFR, Est African American: 74 mL/min (ref >=60)
GFR, Est Non African American: 65 mL/min (ref >=60)
Glucose, Bld: 96 mg/dL (ref 65–99)
POTASSIUM: 4.2 mmol/L (ref 3.5–5.3)
SODIUM: 140 mmol/L (ref 135–146)
Total Bilirubin: 0.5 mg/dL (ref 0.2–1.2)
Total Protein: 7.3 g/dL (ref 6.1–8.1)

## 2017-01-18 LAB — LIPID PANEL
Cholesterol: 175 mg/dL (ref <200)
HDL: 65 mg/dL (ref >50)
LDL CALC: 95 mg/dL (ref <100)
Total CHOL/HDL Ratio: 2.7 Ratio (ref <5.0)
Triglycerides: 76 mg/dL (ref <150)
VLDL: 15 mg/dL (ref <30)

## 2017-01-18 MED ORDER — LOSARTAN POTASSIUM 25 MG PO TABS: 25.0000 mg | ORAL_TABLET | Freq: Every day | ORAL | 1 refills | Status: DC

## 2017-01-18 NOTE — Progress Notes (Addendum)
Subjective:   Wendy Figueroa is a 71 y.o. female who presents for an Initial Medicare Annual Wellness Visit.      Objective:    Today's Vitals   01/18/17 0853  BP: (!) 158/76  Pulse: 98  Temp: 98.5 F (36.9 C)  TempSrc: Oral  SpO2: 97%  Weight: 220 lb (99.8 kg)  Height: 5\' 2"  (1.575 m)   Body mass index is 40.24 kg/m.   Current Medications (verified) Outpatient Encounter Prescriptions as of 01/18/2017  Medication Sig  . budesonide-formoterol (SYMBICORT) 160-4.5 MCG/ACT inhaler Inhale 2 puffs into the lungs 2 (two) times daily.    . Calcium Carb-Cholecalciferol (CALCIUM 600 + D PO) Take by mouth. Take one tablet daily  . chlorpheniramine-HYDROcodone (TUSSIONEX PENNKINETIC ER) 10-8 MG/5ML SUER Take 5 mLs by mouth every 12 (twelve) hours as needed for cough.  . fluticasone (FLONASE) 50 MCG/ACT nasal spray 2 sprays daily.  . furosemide (LASIX) 40 MG tablet TAKE 1 TABLET (40 MG TOTAL) BY MOUTH DAILY.  Marland Kitchen gabapentin (NEURONTIN) 100 MG capsule Take 100 mg by mouth at bedtime.  Marland Kitchen KLOR-CON M20 20 MEQ tablet TAKE 2 TABLETS EVERY DAY FOR POTASSIUM SUPPLEMENT  . levocetirizine (XYZAL) 5 MG tablet Take 5 mg by mouth every evening.    Marland Kitchen losartan (COZAAR) 25 MG tablet Take 25 mg by mouth daily.  Marland Kitchen losartan (COZAAR) 50 MG tablet TAKE 1 TABLET EVERY DAY FOR BLOOD PRESSURE  . montelukast (SINGULAIR) 10 MG tablet Take 10 mg by mouth at bedtime.    . Multiple Vitamins-Minerals (MACULAR VITAMIN BENEFIT PO) Take 1 capsule by mouth 2 (two) times daily.    Marland Kitchen NIFEdipine (PROCARDIA XL/ADALAT-CC) 60 MG 24 hr tablet TAKE 1 TABLET EVERY DAY  . NON FORMULARY Allergy shots once weekly  . Omega-3 Fatty Acids (FISH OIL PO) Take by mouth daily.  . pantoprazole (PROTONIX) 40 MG tablet Take one tablet by mouth twice daily for stomach  . pravastatin (PRAVACHOL) 40 MG tablet TAKE 1 TABLET BY MOUTH EVERY DAY  . ranitidine (ZANTAC) 300 MG tablet Take 300 mg by mouth at bedtime.  Marland Kitchen SPIRIVA HANDIHALER 18 MCG  inhalation capsule 18 mcg 2 (two) times daily.  . [DISCONTINUED] budesonide-formoterol (SYMBICORT) 160-4.5 MCG/ACT inhaler Inhale 2 puffs into the lungs 2 (two) times daily.   No facility-administered encounter medications on file as of 01/18/2017.     Allergies (verified) Demerol; Lisinopril-hydrochlorothiazide; and Penicillins   History: Past Medical History:  Diagnosis Date  . Abnormal Pap smear    Over 33yrs ago  . ALKALINE PHOSPHATASE, ELEVATED 07/01/2009   Qualifier: Diagnosis of  By: [name] MD, [name]   . Allergic rhinitis   . Anemia   . Arthritis   . Asthma   . Cough    occasional  . Cystocele 11/05/08  . Diverticulosis of colon   . DJD (degenerative joint disease)    no per pt  . Elevated alkaline phosphatase level   . GERD (gastroesophageal reflux disease)   . H/O urinary incontinence 11/06/2008  . H/O varicella   . Hard measles   . Hx of colonic polyps   . Hypercholesterolemia   . Hypertension   . Leg swelling    occasional  . 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 Overweight(278.02)   . Rectocele 11/06/2008   Large  . Sebaceous cyst    on back  . Sinusitis   . Venous insufficiency   . Wheezing    occasional  . Yeast infection  Past Surgical History:  Procedure Laterality Date  . ABDOMINAL HYSTERECTOMY  1988  . ANTERIOR AND POSTERIOR REPAIR  11/05/08  . APOGEE / PERIGEE REPAIR  10/2008   Dr. Octavio Manns  . bladder tack  2011  . BREAST SURGERY  1978   cyst removed from both removed  . COLONOSCOPY    . ESOPHAGOGASTRODUODENOSCOPY     Family History  Problem Relation Age of Onset  . Aneurysm Mother   . Lung cancer Father   . Hypertension Daughter   . Hypertension Daughter   . Colon cancer Neg Hx   . Esophageal cancer Neg Hx   . Stomach cancer Neg Hx   . Rectal cancer Neg Hx   . Colon polyps Neg Hx    Social History   Occupational History  . LPN     Social History Main Topics  . Smoking status: Never Smoker  . Smokeless  tobacco: Never Used  . Alcohol use Yes     Comment: 2 beers on the weekend  . Drug use: No  . Sexual activity: Not on file    Tobacco Counseling Counseling given: Not Answered   Activities of Daily Living In your present state of health, do you have any difficulty performing the following activities: 01/18/2017 09/09/2016  Hearing? N N  Vision? N N  Difficulty concentrating or making decisions? N N  Walking or climbing stairs? Y Y  Dressing or bathing? N N  Doing errands, shopping? N N  Preparing Food and eating ? N -  Using the Toilet? N -  In the past six months, have you accidently leaked urine? Y -  Do you have problems with loss of bowel control? Y -  Managing your Medications? N -  Managing your Finances? N -  Housekeeping or managing your Housekeeping? N -  Some recent data might be hidden    Immunizations and Health Maintenance Immunization History  Administered Date(s) Administered  . Influenza Split 06/11/2011, 05/27/2013  . Influenza Whole 07/05/2010, 04/23/2012  . Influenza,inj,Quad PF,36+ Mos 05/29/2015, 05/06/2016  . Influenza-Unspecified 05/22/2014, 10/26/2015  . Pneumococcal Conjugate-13 10/14/2014  . Pneumococcal Polysaccharide-23 07/29/2010   Health Maintenance Due  Topic Date Due  . Hepatitis C Screening  Feb 24, 1946    Patient Care Team: [name], DO as PCP - General (Internal Medicine) Monna Fam, MD as Consulting Physician (Ophthalmology)  Indicate any recent Medical Services you may have received from other than [facility] providers in the past year (date may be approximate).     Assessment:   This is a routine wellness examination for Wendy Figueroa.   Hearing/Vision screen No exam data present  Dietary issues and exercise activities discussed: Current Exercise Habits: The patient does not participate in regular exercise at present, Exercise limited by: None identified  Goals      Patient Stated   . Exercise 3x per week (30 min per  time) (pt-stated)          Starting today I will get up early enough to go to the gym 2-3 times a week.      Depression Screen PHQ 2/9 Scores 01/18/2017 09/09/2016 01/01/2016 05/29/2015 10/14/2014 06/04/2014 01/08/2014  PHQ - 2 Score 0 0 0 0 0 0 0    Fall Risk Fall Risk  01/18/2017 01/11/2017 09/09/2016 05/06/2016 01/01/2016  Falls in the past year? No No No Yes No  Number falls in past yr: - - - 1 -  Injury with Fall? - - - No -  Cognitive Function: MMSE - Mini Mental State Exam 01/18/2017 09/09/2016 05/29/2015  Orientation to time 5 4 5   Orientation to Place 5 5 5   Registration 3 3 3   Attention/ Calculation 5 3 5   Recall 2 2 3   Language- name 2 objects 2 2 2   Language- repeat 1 1 1   Language- follow 3 step command 3 2 3   Language- read & follow direction 1 1 1   Write a sentence 1 1 1   Copy design 1 1 1   Total score 29 25 30         Screening Tests Health Maintenance  Topic Date Due  . Hepatitis C Screening  [birth year]  . TETANUS/TDAP  08/23/2023 (Originally 10/29/1964)  . INFLUENZA VACCINE  03/22/2017  . MAMMOGRAM  07/01/2017  . COLONOSCOPY  06/12/2018  . DEXA SCAN  Completed  . PNA vac Low Risk Adult  Completed      Plan:    I have personally reviewed and addressed the Medicare Annual Wellness questionnaire and have noted the following in the patient's chart:  A. Medical and social history B. Use of alcohol, tobacco or illicit drugs  C. Current medications and supplements D. Functional ability and status E.  Nutritional status F.  Physical activity G. Advance directives H. List of other physicians I.  Hospitalizations, surgeries, and ER visits in previous 12 months J.  [place] to include hearing, vision, cognitive, depression L. Referrals and appointments - none     In addition, I have reviewed and discussed with patient certain preventive protocols, quality metrics, and best practice recommendations. A written personalized care plan for preventive  services as well as general preventive health recommendations were provided to patient.  See attached scanned questionnaire for additional information.   Signed,   Rich Reining, RN Nurse Health Advisor

## 2017-01-18 NOTE — Progress Notes (Addendum)
Quick Notes   Health Maintenance: Hep C screen due. Pt has TDAP and shingles prescriptions    Abnormal Screen: MMSE 29/30. Passed clock drawing. BP 158/76 (pt did not take her meds this morning b/c of labs)     Patient Concerns: stress incontinence with coughing     Nurse Concerns: None

## 2017-01-18 NOTE — Telephone Encounter (Signed)
Patient requested to be sent to [facility]. Faxed.

## 2017-01-18 NOTE — Patient Instructions (Signed)
Wendy Figueroa , Thank you for taking time to come for your Medicare Wellness Visit. I appreciate your ongoing commitment to your health goals. Please review the following plan we discussed and let me know if I can assist you in the future.   Screening recommendations/referrals: Colonoscopy pt up to date. Mammogram up to date. Due 10/07/18 Bone Density up to date Recommended yearly ophthalmology/optometry visit for glaucoma screening and checkup Recommended yearly dental visit for hygiene and checkup  Vaccinations: Influenza vaccine up to date. Due 05/06/2017 Pneumococcal vaccine up to date Tdap vaccine due. You have prescription. Shingles vaccine due. You have prescription  Advanced directives: Advance directive discussed with you today. I have provided a copy for you to complete at home and have notarized. Once this is complete please bring a copy in to our office so we can scan it into your chart.   Conditions/risks identified: none  Next appointment: Dr. Eulas Post 05/17/17 @ 9:15 am   Preventive Care 65 Years and Older, Female Preventive care refers to lifestyle choices and visits with your health care provider that can promote health and wellness. What does preventive care include?  A yearly physical exam. This is also called an annual well check.  Dental exams once or twice a year.  Routine eye exams. Ask your health care provider how often you should have your eyes checked.  Personal lifestyle choices, including:  Daily care of your teeth and gums.  Regular physical activity.  Eating a healthy diet.  Avoiding tobacco and drug use.  Limiting alcohol use.  Practicing safe sex.  Taking low-dose aspirin every day.  Taking vitamin and mineral supplements as recommended by your health care provider. What happens during an annual well check? The services and screenings done by your health care provider during your annual well check will depend on your age, overall health,  lifestyle risk factors, and family history of disease. Counseling  Your health care provider may ask you questions about your:  Alcohol use.  Tobacco use.  Drug use.  Emotional well-being.  Home and relationship well-being.  Sexual activity.  Eating habits.  History of falls.  Memory and ability to understand (cognition).  Work and work Statistician.  Reproductive health. Screening  You may have the following tests or measurements:  Height, weight, and BMI.  Blood pressure.  Lipid and cholesterol levels. These may be checked every 5 years, or more frequently if you are over 107 years old.  Skin check.  Lung cancer screening. You may have this screening every year starting at age 49 if you have a 30-pack-year history of smoking and currently smoke or have quit within the past 15 years.  Fecal occult blood test (FOBT) of the stool. You may have this test every year starting at age 38.  Flexible sigmoidoscopy or colonoscopy. You may have a sigmoidoscopy every 5 years or a colonoscopy every 10 years starting at age 57.  Hepatitis C blood test.  Hepatitis B blood test.  Sexually transmitted disease (STD) testing.  Diabetes screening. This is done by checking your blood sugar (glucose) after you have not eaten for a while (fasting). You may have this done every 1-3 years.  Bone density scan. This is done to screen for osteoporosis. You may have this done starting at age 63.  Mammogram. This may be done every 1-2 years. Talk to your health care provider about how often you should have regular mammograms. Talk with your health care provider about your test results, treatment  options, and if necessary, the need for more tests. Vaccines  Your health care provider may recommend certain vaccines, such as:  Influenza vaccine. This is recommended every year.  Tetanus, diphtheria, and acellular pertussis (Tdap, Td) vaccine. You may need a Td booster every 10 years.  Zoster  vaccine. You may need this after age 70.  Pneumococcal 13-valent conjugate (PCV13) vaccine. One dose is recommended after age 53.  Pneumococcal polysaccharide (PPSV23) vaccine. One dose is recommended after age 34. Talk to your health care provider about which screenings and vaccines you need and how often you need them. This information is not intended to replace advice given to you by your health care provider. Make sure you discuss any questions you have with your health care provider. Document Released: 09/04/2015 Document Revised: 04/27/2016 Document Reviewed: 06/09/2015 Elsevier Interactive Patient Education  2017 [employer] Prevention in the Home Falls can cause injuries. They can happen to people of all ages. There are many things you can do to make your home safe and to help prevent falls. What can I do on the outside of my home?  Regularly fix the edges of walkways and driveways and fix any cracks.  Remove anything that might make you trip as you walk through a door, such as a raised step or threshold.  Trim any bushes or trees on the path to your home.  Use bright outdoor lighting.  Clear any walking paths of anything that might make someone trip, such as rocks or tools.  Regularly check to see if handrails are loose or broken. Make sure that both sides of any steps have handrails.  Any raised decks and porches should have guardrails on the edges.  Have any leaves, snow, or ice cleared regularly.  Use sand or salt on walking paths during winter.  Clean up any spills in your garage right away. This includes oil or grease spills. What can I do in the bathroom?  Use night lights.  Install grab bars by the toilet and in the tub and shower. Do not use towel bars as grab bars.  Use non-skid mats or decals in the tub or shower.  If you need to sit down in the shower, use a plastic, non-slip stool.  Keep the floor dry. Clean up any water that spills on the  floor as soon as it happens.  Remove soap buildup in the tub or shower regularly.  Attach bath mats securely with double-sided non-slip rug tape.  Do not have throw rugs and other things on the floor that can make you trip. What can I do in the bedroom?  Use night lights.  Make sure that you have a light by your bed that is easy to reach.  Do not use any sheets or blankets that are too big for your bed. They should not hang down onto the floor.  Have a firm chair that has side arms. You can use this for support while you get dressed.  Do not have throw rugs and other things on the floor that can make you trip. What can I do in the kitchen?  Clean up any spills right away.  Avoid walking on wet floors.  Keep items that you use a lot in easy-to-reach places.  If you need to reach something above you, use a strong step stool that has a grab bar.  Keep electrical cords out of the way.  Do not use floor polish or wax that makes floors slippery.  If you must use wax, use non-skid floor wax.  Do not have throw rugs and other things on the floor that can make you trip. What can I do with my stairs?  Do not leave any items on the stairs.  Make sure that there are handrails on both sides of the stairs and use them. Fix handrails that are broken or loose. Make sure that handrails are as long as the stairways.  Check any carpeting to make sure that it is firmly attached to the stairs. Fix any carpet that is loose or worn.  Avoid having throw rugs at the top or bottom of the stairs. If you do have throw rugs, attach them to the floor with carpet tape.  Make sure that you have a light switch at the top of the stairs and the bottom of the stairs. If you do not have them, ask someone to add them for you. What else can I do to help prevent falls?  Wear shoes that:  Do not have high heels.  Have rubber bottoms.  Are comfortable and fit you well.  Are closed at the toe. Do not wear  sandals.  If you use a stepladder:  Make sure that it is fully opened. Do not climb a closed stepladder.  Make sure that both sides of the stepladder are locked into place.  Ask someone to hold it for you, if possible.  Clearly mark and make sure that you can see:  Any grab bars or handrails.  First and last steps.  Where the edge of each step is.  Use tools that help you move around (mobility aids) if they are needed. These include:  Canes.  Walkers.  Scooters.  Crutches.  Turn on the lights when you go into a dark area. Replace any light bulbs as soon as they burn out.  Set up your furniture so you have a clear path. Avoid moving your furniture around.  If any of your floors are uneven, fix them.  If there are any pets around you, be aware of where they are.  Review your medicines with your doctor. Some medicines can make you feel dizzy. This can increase your chance of falling. Ask your doctor what other things that you can do to help prevent falls. This information is not intended to replace advice given to you by your health care provider. Make sure you discuss any questions you have with your health care provider. Document Released: 06/04/2009 Document Revised: 01/14/2016 Document Reviewed: 09/12/2014 Elsevier Interactive Patient Education  2017 [employer].

## 2017-01-20 DIAGNOSIS — J3089 Other allergic rhinitis: Secondary | ICD-10-CM | POA: Diagnosis not present

## 2017-01-20 DIAGNOSIS — J301 Allergic rhinitis due to pollen: Secondary | ICD-10-CM | POA: Diagnosis not present

## 2017-01-20 DIAGNOSIS — J3081 Allergic rhinitis due to animal (cat) (dog) hair and dander: Secondary | ICD-10-CM | POA: Diagnosis not present

## 2017-02-03 DIAGNOSIS — J301 Allergic rhinitis due to pollen: Secondary | ICD-10-CM | POA: Diagnosis not present

## 2017-02-03 DIAGNOSIS — J3089 Other allergic rhinitis: Secondary | ICD-10-CM | POA: Diagnosis not present

## 2017-02-03 DIAGNOSIS — J3081 Allergic rhinitis due to animal (cat) (dog) hair and dander: Secondary | ICD-10-CM | POA: Diagnosis not present

## 2017-02-07 ENCOUNTER — Encounter: Payer: Self-pay | Admitting: Podiatry

## 2017-02-07 ENCOUNTER — Ambulatory Visit (INDEPENDENT_AMBULATORY_CARE_PROVIDER_SITE_OTHER): Payer: PPO | Admitting: Podiatry

## 2017-02-07 DIAGNOSIS — M79676 Pain in unspecified toe(s): Secondary | ICD-10-CM | POA: Diagnosis not present

## 2017-02-07 DIAGNOSIS — B351 Tinea unguium: Secondary | ICD-10-CM

## 2017-02-07 NOTE — Progress Notes (Signed)
Patient ID: Wendy Figueroa, female   DOB: 08/27/1945, 71 y.o.   MRN: [id number]    Subjective: This patient presents again for schedule visit complaining of elongated and thickened toenails which are on call for walking wearing shoes and she requests toenail debridement  Objective: Orientated 3 pitting edema left lower extremity, with long history DP and PT pulses 2/4 bilaterally Capillary reflex immediate bilaterally Sensation to 10 g monofilament wire intact 5/5 bilaterally Vibratory sensation reactive bilaterally Ankle reflex equal and reactive bilaterally HAV bilaterally Hammertoe second bilaterally Manual motor testing dorsi flexion, plantar flexion, inversion, eversion 5/5 bilaterally No open skin lesions bilaterally  Atrophic skin with absent hair growth bilaterally The toenails are brittle, deformed, discolored, hypertrophic and tender direct palpation 6-10 Manual motor testing dorsi flexion, plantar flexion 5/5 bilaterally  Assessment: Symptomatic onychomycoses 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  

## 2017-02-10 NOTE — Telephone Encounter (Signed)
Called patient no answer and was unable to leave VM. Will try again at a later time.

## 2017-02-17 DIAGNOSIS — J301 Allergic rhinitis due to pollen: Secondary | ICD-10-CM | POA: Diagnosis not present

## 2017-02-17 DIAGNOSIS — J3081 Allergic rhinitis due to animal (cat) (dog) hair and dander: Secondary | ICD-10-CM | POA: Diagnosis not present

## 2017-02-17 DIAGNOSIS — J3089 Other allergic rhinitis: Secondary | ICD-10-CM | POA: Diagnosis not present

## 2017-03-03 DIAGNOSIS — J3081 Allergic rhinitis due to animal (cat) (dog) hair and dander: Secondary | ICD-10-CM | POA: Diagnosis not present

## 2017-03-03 DIAGNOSIS — J301 Allergic rhinitis due to pollen: Secondary | ICD-10-CM | POA: Diagnosis not present

## 2017-03-03 DIAGNOSIS — J3089 Other allergic rhinitis: Secondary | ICD-10-CM | POA: Diagnosis not present

## 2017-03-05 ENCOUNTER — Other Ambulatory Visit: Payer: Self-pay | Admitting: Internal Medicine

## 2017-03-07 ENCOUNTER — Telehealth: Payer: Self-pay | Admitting: *Deleted

## 2017-03-07 DIAGNOSIS — R053 Chronic cough: Secondary | ICD-10-CM

## 2017-03-07 DIAGNOSIS — R05 Cough: Secondary | ICD-10-CM

## 2017-03-07 DIAGNOSIS — R059 Cough, unspecified: Secondary | ICD-10-CM

## 2017-03-07 MED ORDER — HYDROCOD POLST-CPM POLST ER 10-8 MG/5ML PO SUER: 5.0000 mL | Freq: Two times a day (BID) | ORAL | 0 refills | Status: DC | PRN

## 2017-03-07 NOTE — Telephone Encounter (Signed)
Ok to [employer]. She takes it prn chronically for persistent cough

## 2017-03-07 NOTE — Telephone Encounter (Signed)
Rx printed and placed for signature

## 2017-03-07 NOTE — Telephone Encounter (Signed)
Patient called wanting a refill on her Tussionex for cough. Is this ok to refill? Please Advise.

## 2017-03-08 DIAGNOSIS — H40023 Open angle with borderline findings, high risk, bilateral: Secondary | ICD-10-CM | POA: Diagnosis not present

## 2017-03-08 DIAGNOSIS — L309 Dermatitis, unspecified: Secondary | ICD-10-CM | POA: Diagnosis not present

## 2017-03-08 DIAGNOSIS — H04123 Dry eye syndrome of bilateral lacrimal glands: Secondary | ICD-10-CM | POA: Diagnosis not present

## 2017-03-12 ENCOUNTER — Other Ambulatory Visit: Payer: Self-pay | Admitting: Internal Medicine

## 2017-03-13 ENCOUNTER — Other Ambulatory Visit: Payer: Self-pay | Admitting: Internal Medicine

## 2017-03-17 DIAGNOSIS — J301 Allergic rhinitis due to pollen: Secondary | ICD-10-CM | POA: Diagnosis not present

## 2017-03-17 DIAGNOSIS — J3089 Other allergic rhinitis: Secondary | ICD-10-CM | POA: Diagnosis not present

## 2017-03-17 DIAGNOSIS — J3081 Allergic rhinitis due to animal (cat) (dog) hair and dander: Secondary | ICD-10-CM | POA: Diagnosis not present

## 2017-03-26 ENCOUNTER — Inpatient Hospital Stay (HOSPITAL_COMMUNITY)
Admission: EM | Admit: 2017-03-26 | Discharge: 2017-03-29 | DRG: 175 | Disposition: A | Discharge status: Home / Self Care | Payer: PPO | Attending: Internal Medicine | Admitting: Internal Medicine

## 2017-03-26 ENCOUNTER — Encounter (HOSPITAL_COMMUNITY): Payer: Self-pay | Admitting: Radiology

## 2017-03-26 ENCOUNTER — Emergency Department (HOSPITAL_COMMUNITY): Payer: PPO

## 2017-03-26 DIAGNOSIS — Z90710 Acquired absence of both cervix and uterus: Secondary | ICD-10-CM

## 2017-03-26 DIAGNOSIS — K219 Gastro-esophageal reflux disease without esophagitis: Secondary | ICD-10-CM | POA: Diagnosis present

## 2017-03-26 DIAGNOSIS — Z880 Allergy status to penicillin: Secondary | ICD-10-CM | POA: Diagnosis not present

## 2017-03-26 DIAGNOSIS — Z79899 Other long term (current) drug therapy: Secondary | ICD-10-CM | POA: Diagnosis not present

## 2017-03-26 DIAGNOSIS — I2699 Other pulmonary embolism without acute cor pulmonale: Secondary | ICD-10-CM | POA: Diagnosis not present

## 2017-03-26 DIAGNOSIS — Z8249 Family history of ischemic heart disease and other diseases of the circulatory system: Secondary | ICD-10-CM | POA: Diagnosis not present

## 2017-03-26 DIAGNOSIS — Z888 Allergy status to other drugs, medicaments and biological substances status: Secondary | ICD-10-CM | POA: Diagnosis not present

## 2017-03-26 DIAGNOSIS — Z7951 Long term (current) use of inhaled steroids: Secondary | ICD-10-CM | POA: Diagnosis not present

## 2017-03-26 DIAGNOSIS — R0602 Shortness of breath: Secondary | ICD-10-CM | POA: Diagnosis not present

## 2017-03-26 DIAGNOSIS — M25511 Pain in right shoulder: Secondary | ICD-10-CM | POA: Diagnosis not present

## 2017-03-26 DIAGNOSIS — I10 Essential (primary) hypertension: Secondary | ICD-10-CM | POA: Diagnosis not present

## 2017-03-26 DIAGNOSIS — Z885 Allergy status to narcotic agent status: Secondary | ICD-10-CM

## 2017-03-26 DIAGNOSIS — E663 Overweight: Secondary | ICD-10-CM | POA: Diagnosis not present

## 2017-03-26 DIAGNOSIS — I2609 Other pulmonary embolism with acute cor pulmonale: Principal | ICD-10-CM | POA: Diagnosis present

## 2017-03-26 DIAGNOSIS — E785 Hyperlipidemia, unspecified: Secondary | ICD-10-CM | POA: Diagnosis not present

## 2017-03-26 DIAGNOSIS — J9601 Acute respiratory failure with hypoxia: Secondary | ICD-10-CM | POA: Diagnosis not present

## 2017-03-26 DIAGNOSIS — Z66 Do not resuscitate: Secondary | ICD-10-CM | POA: Diagnosis not present

## 2017-03-26 DIAGNOSIS — J4520 Mild intermittent asthma, uncomplicated: Secondary | ICD-10-CM | POA: Diagnosis present

## 2017-03-26 DIAGNOSIS — R490 Dysphonia: Secondary | ICD-10-CM | POA: Diagnosis present

## 2017-03-26 DIAGNOSIS — I361 Nonrheumatic tricuspid (valve) insufficiency: Secondary | ICD-10-CM | POA: Diagnosis not present

## 2017-03-26 DIAGNOSIS — Z6841 Body Mass Index (BMI) 40.0 and over, adult: Secondary | ICD-10-CM

## 2017-03-26 DIAGNOSIS — J9621 Acute and chronic respiratory failure with hypoxia: Secondary | ICD-10-CM | POA: Diagnosis present

## 2017-03-26 DIAGNOSIS — E7800 Pure hypercholesterolemia, unspecified: Secondary | ICD-10-CM | POA: Diagnosis present

## 2017-03-26 DIAGNOSIS — J45909 Unspecified asthma, uncomplicated: Secondary | ICD-10-CM | POA: Diagnosis not present

## 2017-03-26 LAB — PROTIME-INR
INR: 1.07
Prothrombin Time: 13.9 seconds (ref 11.4–15.2)

## 2017-03-26 LAB — BASIC METABOLIC PANEL
ANION GAP: 9 (ref 5–15)
BUN: 13 mg/dL (ref 6–20)
CALCIUM: 9.4 mg/dL (ref 8.9–10.3)
CO2: 22 mmol/L (ref 22–32)
Chloride: 110 mmol/L (ref 101–111)
Creatinine, Ser: 0.96 mg/dL (ref 0.44–1.00)
GFR calc Af Amer: >60 mL/min (ref >60)
GFR calc non Af Amer: 57 mL/min — ABNORMAL LOW (ref >60)
GLUCOSE: 140 mg/dL — AB (ref 65–99)
POTASSIUM: 4.1 mmol/L (ref 3.5–5.1)
Sodium: 141 mmol/L (ref 135–145)

## 2017-03-26 LAB — CBC
HEMATOCRIT: 35.5 % — AB (ref 36.0–46.0)
HEMOGLOBIN: 12 g/dL (ref 12.0–15.0)
MCH: 26.8 pg (ref 26.0–34.0)
MCHC: 33.8 g/dL (ref 30.0–36.0)
MCV: 79.2 fL (ref 78.0–100.0)
Platelets: 191 10*3/uL (ref 150–400)
RBC: 4.48 MIL/uL (ref 3.87–5.11)
RDW: 14.8 % (ref 11.5–15.5)
WBC: 10.7 10*3/uL — ABNORMAL HIGH (ref 4.0–10.5)

## 2017-03-26 LAB — I-STAT TROPONIN, ED: Troponin i, poc: 0.03 ng/mL (ref 0.00–0.08)

## 2017-03-26 LAB — HEPARIN LEVEL (UNFRACTIONATED): Heparin Unfractionated: 0.46 IU/mL (ref 0.30–0.70)

## 2017-03-26 LAB — MRSA PCR SCREENING: MRSA by PCR: NEGATIVE

## 2017-03-26 LAB — BRAIN NATRIURETIC PEPTIDE: B Natriuretic Peptide: 445.4 pg/mL — ABNORMAL HIGH (ref 0.0–100.0)

## 2017-03-26 LAB — APTT: APTT: 28 s (ref 24–36)

## 2017-03-26 MED ORDER — RIVAROXABAN 20 MG PO TABS: 20.0000 mg | ORAL_TABLET | Freq: Every day | ORAL | Status: DC

## 2017-03-26 MED ORDER — PRAVASTATIN SODIUM 40 MG PO TABS
40.0000 mg | ORAL_TABLET | Freq: Every day | ORAL | Status: DC
  Administered 2017-03-26 – 2017-03-28 (×3): 40 mg via ORAL
  Filled 2017-03-26 (×2): qty 2
  Filled 2017-03-26 (×3): qty 1

## 2017-03-26 MED ORDER — RIVAROXABAN 15 MG PO TABS: 15.0000 mg | ORAL_TABLET | Freq: Two times a day (BID) | ORAL | Status: DC

## 2017-03-26 MED ORDER — PANTOPRAZOLE SODIUM 40 MG PO TBEC
40.0000 mg | DELAYED_RELEASE_TABLET | Freq: Two times a day (BID) | ORAL | Status: DC
Start: 2017-03-26 — End: 2017-03-29
  Administered 2017-03-26 – 2017-03-29 (×6): 40 mg via ORAL
  Filled 2017-03-26 (×6): qty 1

## 2017-03-26 MED ORDER — LOSARTAN POTASSIUM 25 MG PO TABS
75.0000 mg | ORAL_TABLET | Freq: Every day | ORAL | Status: DC
  Administered 2017-03-27 – 2017-03-29 (×3): 75 mg via ORAL
  Filled 2017-03-26 (×3): qty 1

## 2017-03-26 MED ORDER — ACETAMINOPHEN 325 MG PO TABS: 650.0000 mg | ORAL_TABLET | Freq: Four times a day (QID) | ORAL | Status: DC | PRN

## 2017-03-26 MED ORDER — ONDANSETRON HCL 4 MG PO TABS: 4.0000 mg | ORAL_TABLET | Freq: Four times a day (QID) | ORAL | Status: DC | PRN

## 2017-03-26 MED ORDER — LORATADINE 10 MG PO TABS
10.0000 mg | ORAL_TABLET | Freq: Every day | ORAL | Status: DC
  Administered 2017-03-27 – 2017-03-29 (×3): 10 mg via ORAL
  Filled 2017-03-26 (×3): qty 1

## 2017-03-26 MED ORDER — IOPAMIDOL (ISOVUE-370) INJECTION 76%
INTRAVENOUS | Status: AC
  Administered 2017-03-26: 100 mL
  Filled 2017-03-26: qty 100

## 2017-03-26 MED ORDER — SODIUM CHLORIDE 0.9 % IV SOLN
INTRAVENOUS | Status: DC
  Administered 2017-03-26: 18:00:00 via INTRAVENOUS

## 2017-03-26 MED ORDER — IPRATROPIUM BROMIDE 0.02 % IN SOLN
0.5000 mg | Freq: Once | RESPIRATORY_TRACT | Status: AC
  Administered 2017-03-26: 0.5 mg via RESPIRATORY_TRACT
  Filled 2017-03-26: qty 2.5

## 2017-03-26 MED ORDER — HEPARIN (PORCINE) IN NACL 100-0.45 UNIT/ML-% IJ SOLN
1300.0000 [IU]/h | INTRAMUSCULAR | Status: AC
  Administered 2017-03-26 – 2017-03-27 (×2): 1300 [IU]/h via INTRAVENOUS
  Filled 2017-03-26 (×2): qty 250

## 2017-03-26 MED ORDER — ACETAMINOPHEN 650 MG RE SUPP: 650.0000 mg | Freq: Four times a day (QID) | RECTAL | Status: DC | PRN

## 2017-03-26 MED ORDER — ONDANSETRON HCL 4 MG/2ML IJ SOLN: 4.0000 mg | Freq: Four times a day (QID) | INTRAMUSCULAR | Status: DC | PRN

## 2017-03-26 MED ORDER — HEPARIN BOLUS VIA INFUSION
4000.0000 [IU] | Freq: Once | INTRAVENOUS | Status: AC
  Administered 2017-03-26: 4000 [IU] via INTRAVENOUS
  Filled 2017-03-26: qty 4000

## 2017-03-26 MED ORDER — MONTELUKAST SODIUM 10 MG PO TABS
10.0000 mg | ORAL_TABLET | Freq: Every day | ORAL | Status: DC
  Administered 2017-03-26 – 2017-03-28 (×3): 10 mg via ORAL
  Filled 2017-03-26 (×3): qty 1

## 2017-03-26 MED ORDER — FLUTICASONE PROPIONATE 50 MCG/ACT NA SUSP
2.0000 | Freq: Every day | NASAL | Status: DC
  Administered 2017-03-26 – 2017-03-29 (×4): 2 via NASAL
  Filled 2017-03-26: qty 16

## 2017-03-26 MED ORDER — SODIUM CHLORIDE 0.9% FLUSH
3.0000 mL | Freq: Two times a day (BID) | INTRAVENOUS | Status: DC
  Administered 2017-03-26 – 2017-03-29 (×6): 3 mL via INTRAVENOUS

## 2017-03-26 MED ORDER — OXYCODONE HCL 5 MG PO TABS: 5.0000 mg | ORAL_TABLET | ORAL | Status: DC | PRN

## 2017-03-26 MED ORDER — MOMETASONE FURO-FORMOTEROL FUM 200-5 MCG/ACT IN AERO
2.0000 | INHALATION_SPRAY | Freq: Two times a day (BID) | RESPIRATORY_TRACT | Status: DC
  Administered 2017-03-26 – 2017-03-29 (×6): 2 via RESPIRATORY_TRACT
  Filled 2017-03-26: qty 8.8

## 2017-03-26 MED ORDER — GABAPENTIN 100 MG PO CAPS
100.0000 mg | ORAL_CAPSULE | ORAL | Status: DC
  Administered 2017-03-26 – 2017-03-28 (×2): 100 mg via ORAL
  Filled 2017-03-26 (×2): qty 1

## 2017-03-26 MED ORDER — MORPHINE SULFATE (PF) 2 MG/ML IV SOLN
2.0000 mg | INTRAVENOUS | Status: DC | PRN
  Administered 2017-03-26: 2 mg via INTRAVENOUS
  Filled 2017-03-26: qty 1

## 2017-03-26 MED ORDER — POLYETHYLENE GLYCOL 3350 17 G PO PACK: 17.0000 g | PACK | Freq: Every day | ORAL | Status: DC | PRN

## 2017-03-26 MED ORDER — ALBUTEROL SULFATE (2.5 MG/3ML) 0.083% IN NEBU
5.0000 mg | INHALATION_SOLUTION | Freq: Once | RESPIRATORY_TRACT | Status: AC
  Administered 2017-03-26: 5 mg via RESPIRATORY_TRACT
  Filled 2017-03-26: qty 6

## 2017-03-26 NOTE — Progress Notes (Signed)
ANTICOAGULATION CONSULT NOTE - Initial Consult  Pharmacy Consult for Heparin---> Xarelto Indication: pulmonary embolus  Allergies  Allergen Reactions  . Demerol Hives    All over the body   . Lisinopril-Hydrochlorothiazide Hives    All over the body  . Penicillins Hives    Has patient had a PCN reaction causing immediate rash, facial/tongue/throat swelling, SOB or lightheadedness with hypotension: No Has patient had a PCN reaction causing severe rash involving mucus membranes or skin necrosis: No Has patient had a PCN reaction that required hospitalization: No Has patient had a PCN reaction occurring within the last 10 years: No If all of the above answers are "NO", then may proceed with Cephalosporin use.   All over the body    Patient Measurements: Height: 5\' 3"  (160 cm) Weight: 228 lb (103.4 kg) IBW/kg (Calculated) : 52.4 Heparin Dosing Weight: 77 kg  Vital Signs: Temp: 98.3 F (36.8 C) (08/05 1619) Temp Source: Oral (08/05 1619) BP: 143/85 (08/05 1619) Pulse Rate: 117 (08/05 1530)  Labs:  Recent Labs  03/26/17 1130 03/26/17 1136  HGB  --  12.0  HCT  --  35.5*  PLT  --  191  APTT 28  --   LABPROT 13.9  --   INR 1.07  --   CREATININE  --  0.96    Estimated Creatinine Clearance: 60 mL/min (by C-G formula based on SCr of 0.96 mg/dL).   Medical History: Past Medical History:  Diagnosis Date  . Abnormal Pap smear    Over 61yrs ago  . ALKALINE PHOSPHATASE, ELEVATED 07/01/2009   Qualifier: Diagnosis of  By: [name] MD, [name]   . Allergic rhinitis   . Anemia   . Arthritis   . Asthma   . Cough    occasional  . Cystocele 11/05/08  . Diverticulosis of colon   . DJD (degenerative joint disease)    no per pt  . Elevated alkaline phosphatase level   . GERD (gastroesophageal reflux disease)   . H/O urinary incontinence 11/06/2008  . H/O varicella   . Hard measles   . Hx of colonic polyps   . Hypercholesterolemia   . Hypertension   . Leg swelling    occasional  . 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 Overweight(278.02)   . Rectocele 11/06/2008   Large  . Sebaceous cyst    on back  . Sinusitis   . Venous insufficiency   . Wheezing    occasional  . Yeast infection     Assessment:  71 yr female with PMH significant for asthma, HTN, HLD presents with shortness of breath.    CTAngio shows severe bilateral PE with evidence of right heart strain   Pharmacy consulted to dose IV heparin  Patient on no oral anticoagulation PTA  Goal of Therapy:  Heparin level 0.3-0.7 units/ml Monitor platelets by anticoagulation protocol: Yes   Plan:   Obtain baseline PT/INR and aPTT  Heparin 4000 unit IV bolus x 1 followed by infusion @ 1300 units/hr  Check heparin level 8 hr after heparin started  Follow heparin level & CBC daily   ADDENDUM: Upon admission, pharmacy consulted to begin Xarelto for treatment of PE.  IV heparin was started @ 13:45 today.  Spoke with Dr Fredrich Birks and received clarification to begin Xarelto on 8/6 @ 8am.  Plan:    Continue IV heparin @ 1300 units/hr for now and will check heparin level @ 22:00 as previously ordered  At 8am on 8/6, will D/C  IV heparin and give first dose of Xarelto  Xarelto 15mg  po BID x 21 days followed by 20mg  daily  Xarelto teaching  Anneka Studer, Toribio Harbour, PharmD 03/26/2017,5:19 PM

## 2017-03-26 NOTE — ED Triage Notes (Signed)
Pt with hx of asthma c/o shortness of breath while walking that started yesterday. Pt also c/o trouble sleeping even after taking benadryl last night.

## 2017-03-26 NOTE — ED Provider Notes (Signed)
[facility] DEPT Provider Note   CSN: [id number] Arrival date & time: 03/26/17  1058     History   Chief Complaint Chief Complaint  Patient presents with  . Shortness of Breath    HPI Wendy Figueroa is a 71 y.o. female.  HPI Patient presents to the emergency room for evaluation of shortness of breath. Patient noticed the symptoms yesterday. She thought it was related to her asthma. She tried some of her breathing treatments with some mild relief. Her symptoms continued throughout the night. This morning she felt more short of breath. She denies any significant coughing. She has not noticed any heavy wheezing. She has not noticed any leg swelling. No chest pain. Past Medical History:  Diagnosis Date  . Abnormal Pap smear    Over 24yrs ago  . ALKALINE PHOSPHATASE, ELEVATED 07/01/2009   Qualifier: Diagnosis of  By: [name] MD, [name]   . Allergic rhinitis   . Anemia   . Arthritis   . Asthma   . Cough    occasional  . Cystocele 11/05/08  . Diverticulosis of colon   . DJD (degenerative joint disease)    no per pt  . Elevated alkaline phosphatase level   . GERD (gastroesophageal reflux disease)   . H/O urinary incontinence 11/06/2008  . H/O varicella   . Hard measles   . Hx of colonic polyps   . Hypercholesterolemia   . Hypertension   . Leg swelling    occasional  . 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 Overweight(278.02)   . Rectocele 11/06/2008   Large  . Sebaceous cyst    on back  . Sinusitis   . Venous insufficiency   . Wheezing    occasional  . Yeast infection     Patient Active Problem List   Diagnosis Date Noted  . Alkaline phosphatase elevation 05/29/2015  . Asthma, mild intermittent 05/29/2015  . Other allergic rhinitis 05/29/2015  . 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 Cough 05/29/2015  . Dysphonia 05/29/2015  . Osteopenia 07/08/2014  . Hypokalemia 06/04/2014  . Hyperlipidemia LDL goal <130 06/04/2014  .  DEGENERATIVE JOINT DISEASE 07/01/2009  . Disorder of bone and cartilage 07/01/2009  . INSOMNIA 08/11/2008  . HYPERCHOLESTEROLEMIA, BORDERLINE 03/20/2008  . Overweight 03/20/2008  . Allergic rhinitis 03/20/2008  . DIVERTICULOSIS OF COLON 03/20/2008  . COLONIC POLYPS 09/27/2007  . Essential hypertension 09/27/2007  . VENOUS INSUFFICIENCY 09/27/2007  . Asthma 09/27/2007  . GERD 09/27/2007    Past Surgical History:  Procedure Laterality Date  . ABDOMINAL HYSTERECTOMY  1988  . ANTERIOR AND POSTERIOR REPAIR  11/05/08  . APOGEE / PERIGEE REPAIR  10/2008   Dr. Octavio Manns  . bladder tack  2011  . BREAST SURGERY  1978   cyst removed from both removed  . COLONOSCOPY    . ESOPHAGOGASTRODUODENOSCOPY      OB History    Gravida Para Term Preterm AB Living   2 2       2    SAB TAB Ectopic Multiple Live Births           2       Home Medications    Prior to Admission medications   Medication Sig Start Date End Date Taking? Authorizing Provider  albuterol (PROVENTIL HFA;VENTOLIN HFA) 108 (90 Base) MCG/ACT inhaler Inhale 2 puffs into the lungs every 6 (six) hours as needed for wheezing or shortness of breath.   Yes [provider]  budesonide-formoterol (SYMBICORT) 160-4.5 MCG/ACT  inhaler Inhale 2 puffs into the lungs 2 (two) times daily.     Yes [provider]  Calcium Carb-Cholecalciferol (CALCIUM 600 + D PO) Take by mouth. Take one tablet daily   Yes [provider]  cetirizine (ZYRTEC) 10 MG tablet Take 10 mg by mouth daily.   Yes [provider]  chlorpheniramine-HYDROcodone (TUSSIONEX PENNKINETIC ER) 10-8 MG/5ML SUER Take 5 mLs by mouth every 12 (twelve) hours as needed for cough. 03/07/17  Yes Eulas Post, Monica, DO  fluticasone (FLONASE) 50 MCG/ACT nasal spray 2 sprays daily. 12/10/14  Yes [provider]  furosemide (LASIX) 40 MG tablet TAKE 1 TABLET (40 MG TOTAL) BY MOUTH DAILY. 03/06/17  Yes Eulas Post, Monica, DO  gabapentin (NEURONTIN) 100 MG  capsule Take 100 mg by mouth every other day.    Yes [provider]  KLOR-CON M20 20 MEQ tablet TAKE 2 TABLETS EVERY DAY FOR POTASSIUM SUPPLEMENT 12/23/16  Yes [name], DO  levocetirizine (XYZAL) 5 MG tablet Take 5 mg by mouth every evening.     Yes [provider]  losartan (COZAAR) 25 MG tablet Take 25 mg by mouth daily. Take with 50 mg tablet for a total of 75 mg daily.   Yes [provider]  losartan (COZAAR) 50 MG tablet TAKE 1 TABLET EVERY DAY FOR BLOOD PRESSURE 03/13/17  Yes Eulas Post, Monica, DO  montelukast (SINGULAIR) 10 MG tablet Take 10 mg by mouth at bedtime.     Yes [provider]  Multiple Vitamins-Minerals (MACULAR VITAMIN BENEFIT PO) Take 1 capsule by mouth 2 (two) times daily.     Yes [provider]  NIFEdipine (PROCARDIA XL/ADALAT-CC) 60 MG 24 hr tablet TAKE 1 TABLET EVERY DAY 12/05/16  Yes [name], DO  NON FORMULARY Allergy shots once every other week   Yes [provider]  Omega-3 Fatty Acids (FISH OIL PO) Take by mouth daily.   Yes [provider]  pantoprazole (PROTONIX) 40 MG tablet Take one tablet by mouth twice daily for stomach 05/16/16  Yes Eulas Post, Monica, DO  pravastatin (PRAVACHOL) 40 MG tablet TAKE 1 TABLET BY MOUTH EVERY DAY 08/01/16  Yes Eulas Post, Monica, DO  psyllium (METAMUCIL) 58.6 % packet Take 1 packet by mouth daily as needed (constipation).   Yes [provider]  ranitidine (ZANTAC) 300 MG tablet Take 300 mg by mouth at bedtime.   Yes [provider]  SPIRIVA HANDIHALER 18 MCG inhalation capsule 18 mcg 2 (two) times daily. 10/30/14  Yes [provider]  vitamin B-12 (CYANOCOBALAMIN) 1000 MCG tablet Take 1,000 mcg by mouth daily.   Yes [provider]    Family History Family History  Problem Relation Age of Onset  . Aneurysm Mother   . Lung cancer Father   . Hypertension Daughter   . Hypertension Daughter   . Colon cancer Neg Hx   . Esophageal  cancer Neg Hx   . Stomach cancer Neg Hx   . Rectal cancer Neg Hx   . Colon polyps Neg Hx     Social History Social History  Substance Use Topics  . Smoking status: Never Smoker  . Smokeless tobacco: Never Used  . Alcohol use Yes     Comment: 2 beers on the weekend     Allergies   Demerol; Lisinopril-hydrochlorothiazide; and Penicillins   Review of Systems Review of Systems  All other systems reviewed and are negative.    Physical Exam Updated Vital Signs BP (!) 151/98   Pulse Marland Kitchen)  122   Temp 98.5 F (36.9 C) (Oral)   Resp (!) 26   Ht 1.6 m (5\' 3" )   Wt 103.4 kg (228 lb)   SpO2 93%   BMI 40.39 kg/m   Physical Exam  Constitutional: No distress.  Overweight  HENT:  Head: Normocephalic and atraumatic.  Right Ear: External ear normal.  Left Ear: External ear normal.  Eyes: Conjunctivae are normal. Right eye exhibits no discharge. Left eye exhibits no discharge. No scleral icterus.  Neck: Neck supple. No tracheal deviation present.  Cardiovascular: Regular rhythm and intact distal pulses.  Tachycardia present.   Pulmonary/Chest: Effort normal and breath sounds normal. No stridor. No respiratory distress. She has no wheezes. She has no rales.  Abdominal: Soft. Bowel sounds are normal. She exhibits no distension. There is no tenderness. There is no rebound and no guarding.  Musculoskeletal: She exhibits edema (bilateral in the ankles). She exhibits no tenderness.  Neurological: She is alert. She has normal strength. No cranial nerve deficit (no facial droop, extraocular movements intact, no slurred speech) or sensory deficit. She exhibits normal muscle tone. She displays no seizure activity. Coordination normal.  Skin: Skin is warm and dry. No rash noted.  Psychiatric: She has a normal mood and affect.  Nursing note and vitals reviewed.    ED Treatments / Results  Labs (all labs ordered are listed, but only abnormal results are displayed) Labs Reviewed  BASIC  METABOLIC PANEL - Abnormal; Notable for the following:       Result Value   Glucose, Bld 140 (*)    GFR calc non Af Amer 57 (*)    All other components within normal limits  CBC - Abnormal; Notable for the following:    WBC 10.7 (*)    HCT 35.5 (*)    All other components within normal limits  BRAIN NATRIURETIC PEPTIDE - Abnormal; Notable for the following:    B Natriuretic Peptide 445.4 (*)    All other components within normal limits  PROTIME-INR  APTT  I-STAT TROPONIN, ED    EKG  EKG Interpretation  Date/Time:  Sunday March 26 2017 11:21:06 EDT Ventricular Rate:  121 PR Interval:    QRS Duration: 99 QT Interval:  283 QTC Calculation: 402 R Axis:   -144 Text Interpretation:  Sinus tachycardia Markedly posterior QRS axis Low voltage, precordial leads Nonspecific T abnrm, anterolateral leads Since last tracing rate faster Confirmed by [name] [phone]) on 03/26/2017 11:42:34 AM       Radiology Dg Chest 2 View  Result Date: 03/26/2017 CLINICAL DATA:  Shortness of breath.  Asthma. EXAM: CHEST  2 VIEW COMPARISON:  01/08/2014 FINDINGS: The heart size and mediastinal contours are within normal limits. Stable mild pleural thickening in lower left hemithorax. No evidence of pulmonary infiltrate or edema. No evidence of pleural effusion. IMPRESSION: No active cardiopulmonary disease. Electronically Signed   By: Earle Gell M.D.   On: 03/26/2017 12:32   Ct Angio Chest Pe W And/or Wo Contrast  Result Date: 03/26/2017 CLINICAL DATA:  Asthma, short of breath. EXAM: CT ANGIOGRAPHY CHEST WITH CONTRAST TECHNIQUE: Multidetector CT imaging of the chest was performed using the standard protocol during bolus administration of intravenous contrast. Multiplanar CT image reconstructions and MIPs were obtained to evaluate the vascular anatomy. CONTRAST:  100 mL Isovue COMPARISON:  None. FINDINGS: Cardiovascular: Bilateral tubular filling defects within the proximal RIGHT lower lobe pulmonary artery,  LEFT lobe pulmonary artery, RIGHT upper lobe pulmonary arteries, and LEFT upper lobe  pulmonary arteries. The clot burden is severe and partially occlusive to occlusive. There is evidence of RIGHT ventricular strain with the RIGHT ventricular to LEFT ventricular ratio > 1 (1.8) Mediastinum/Nodes: No axillary or supraclavicular adenopathy. No mediastinal hilar adenopathy. No pericardial fluid Lungs/Pleura: Bilateral small pleural effusions. Small focus of consolidation in the RIGHT lower lobe measuring 20 mm could represent small infarction. No pneumothorax Upper Abdomen: Limited view of the liver, kidneys, pancreas are unremarkable. Normal adrenal glands. Musculoskeletal: No aggressive osseous lesion Review of the MIP images confirms the above findings. IMPRESSION: 1. Severe bilateral pulmonary emboli involving the proximal pulmonary arteries of all of the LEFT and RIGHT lung lobes. 2. CT evidence of right heart strain (RV/LV Ratio = 1.8) consistent with at least submassive (intermediate risk) PE. The presence of right heart strain has been associated with an increased risk of morbidity and mortality. Please activate Code PE by paging [phone]. 3. Mild basilar effusion. Potential small infarction at the RIGHT lung base. Critical Value/emergent results were called by telephone at the time of interpretation on 03/26/2017 at 1:13 pm to Dr. [name] , who verbally acknowledged these results. Electronically Signed   By: Suzy Bouchard M.D.   On: 03/26/2017 13:14    Procedures .Critical Care Performed by: [name] Authorized by: [name]   Critical care provider statement:    Critical care time (minutes):  35   Critical care was necessary to treat or prevent imminent or life-threatening deterioration of the following conditions: submassive pe.   Critical care was time spent personally by me on the following activities:  Discussions with consultants, evaluation of patient's response to treatment,  examination of patient, ordering and performing treatments and interventions, ordering and review of laboratory studies, ordering and review of radiographic studies, pulse oximetry, re-evaluation of patient's condition, obtaining history from patient or surrogate and review of old charts   (including critical care time)  Medications Ordered in ED Medications  albuterol (PROVENTIL) (2.5 MG/3ML) 0.083% nebulizer solution 5 mg (5 mg Nebulization Given 03/26/17 1146)  ipratropium (ATROVENT) nebulizer solution 0.5 mg (0.5 mg Nebulization Given 03/26/17 1146)  iopamidol (ISOVUE-370) 76 % injection (100 mLs  Contrast Given 03/26/17 1250)     Initial Impression / Assessment and Plan / ED Course  I have reviewed the triage vital signs and the nursing notes.  Pertinent labs & imaging results that were available during my care of the patient were reviewed by me and considered in my medical decision making (see chart for details).  Clinical Course as of Mar 27 1323  Nancy Fetter Mar 26, 2017  1316 Notified that CT scan shows bilateral PE with signs of right heart strain.   Will start heparin.  Consult with PCCM  [JK]  1319 Discussed with Dr Lamonte Sakai, PCCM.  Recommends admission, then follow up with echocardiogram.  Then reassess at that point to decide on whether she is a candidate for targeted lytics.  [JK]    Clinical Course User Index [JK] [name], MD    Patient presented to the emergency room with worsening shortness of breath. She denied any prior history of pulmonary embolism. No complaints of leg swelling or cramping. Patient did have evidence of pedal edema and calf edema.  Laboratory tests and chest x-ray were relatively unremarkable. CT scan was done to evaluate for pulmonary embolus and. Patient does have bilateral submassive pulmonary embolism. Case was discussed with critical care. Plan on IV heparin. Admit for further treatment and evaluation.  Final Clinical Impressions(s) /  ED Diagnoses   Final  diagnoses:  Other acute pulmonary embolism with acute cor pulmonale (HCC)      [name], MD 03/26/17 1324

## 2017-03-26 NOTE — Progress Notes (Signed)
ANTICOAGULATION CONSULT NOTE - Initial Consult  Pharmacy Consult for Heparin Indication: pulmonary embolus  Allergies  Allergen Reactions  . Demerol Hives    All over the body   . Lisinopril-Hydrochlorothiazide Hives    All over the body  . Penicillins Hives    Has patient had a PCN reaction causing immediate rash, facial/tongue/throat swelling, SOB or lightheadedness with hypotension: No Has patient had a PCN reaction causing severe rash involving mucus membranes or skin necrosis: No Has patient had a PCN reaction that required hospitalization: No Has patient had a PCN reaction occurring within the last 10 years: No If all of the above answers are "NO", then may proceed with Cephalosporin use.   All over the body    Patient Measurements: Height: 5\' 3"  (160 cm) Weight: 228 lb (103.4 kg) IBW/kg (Calculated) : 52.4 Heparin Dosing Weight: 77 kg  Vital Signs: Temp: 98.5 F (36.9 C) (08/05 1112) Temp Source: Oral (08/05 1112) BP: 151/98 (08/05 1112) Pulse Rate: 122 (08/05 1112)  Labs:  Recent Labs  03/26/17 1136  HGB 12.0  HCT 35.5*  PLT 191  CREATININE 0.96    Estimated Creatinine Clearance: 60 mL/min (by C-G formula based on SCr of 0.96 mg/dL).   Medical History: Past Medical History:  Diagnosis Date  . Abnormal Pap smear    Over 78yrs ago  . ALKALINE PHOSPHATASE, ELEVATED 07/01/2009   Qualifier: Diagnosis of  By: [name] MD, [name]   . Allergic rhinitis   . Anemia   . Arthritis   . Asthma   . Cough    occasional  . Cystocele 11/05/08  . Diverticulosis of colon   . DJD (degenerative joint disease)    no per pt  . Elevated alkaline phosphatase level   . GERD (gastroesophageal reflux disease)   . H/O urinary incontinence 11/06/2008  . H/O varicella   . Hard measles   . Hx of colonic polyps   . Hypercholesterolemia   . Hypertension   . Leg swelling    occasional  . 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 Overweight(278.02)   .  Rectocele 11/06/2008   Large  . Sebaceous cyst    on back  . Sinusitis   . Venous insufficiency   . Wheezing    occasional  . Yeast infection     Assessment:  71 yr female with PMH significant for asthma, HTN, HLD presents with shortness of breath.    CTAngio shows severe bilateral PE with evidence of right heart strain   Pharmacy consulted to dose IV heparin  Patient on no oral anticoagulation PTA  Goal of Therapy:  Heparin level 0.3-0.7 units/ml Monitor platelets by anticoagulation protocol: Yes   Plan:   Obtain baseline PT/INR and aPTT  Heparin 4000 unit IV bolus x 1 followed by infusion @ 1300 units/hr  Check heparin level 8 hr after heparin started  Follow heparin level & CBC daily   Mayank Teuscher, Toribio Harbour, PharmD 03/26/2017,1:29 PM

## 2017-03-26 NOTE — H&P (Signed)
History and Physical  [name] Figueroa:[id number] DOB: 08-15-1946 DOA: 03/26/2017  Referring physician: [name], ER physician  PCP: [name], DO  Outpatient Specialists: None Patient coming from: Home & is able to ambulate without assistance  Chief Complaint: Shortness of breath   HPI: Wendy Figueroa is a 71 y.o. female with medical history significant of hypertension, asthma and seasonal allergies who is in her usual state of health and normally quite ambulatory and started noticing yesterday, 8/4, that she was getting very easily winded. Her shortness of breath continued throughout the entire day and into today, 8/5. No chest pain, no cough or other symptoms. No lightheadedness or dizziness. Because symptoms persisted, patient decided this morning to go into the emergency room.  ED Course: In emergency room, patient was noted to have decreased oxygen saturations, requiring 2 L nasal cannula at rest to keep O2 levels at 92%. Labs were noteworthy for a BNP of 445 and a chest x-ray that was unremarkable. CT angiogram of the chest however noted severe bilateral pulmonary emboli involving proximal pulmonary arteries of all the left and right lung lobes as well as CT evidence of right heart strain consistent with a submassive PE. Patient was noted to possibly have a potential small infarction of the right lung base. Once these results were discovered, emergency room physician discussed this with pulmonary/critical care who felt that acute thrombus intervention was not needed at this time. Otherwise, patient stable and hospitalist will call for further evaluation and admission.  Of note, patient reports no leg injury, long car or plane trips, long periods of immobility. She has no previous history of blood clot. She is not on any hormone related medications  Review of Systems: Patient seen in the emergency room . Pt complains of some mild shortness of breath, but right now while at rest on  oxygen, she is feeling okay   Pt denies any headaches, vision changes, dysphagia, chest pain, palpitations, wheezing, cough, abdominal pain, hematuria, dysuria, constipation, diarrhea, focal extremity numbness weakness or pain .  Review of systems are otherwise negative   Past Medical History:  Diagnosis Date  . Abnormal Pap smear    Over 53yrs ago  . ALKALINE PHOSPHATASE, ELEVATED 07/01/2009   Qualifier: Diagnosis of  By: [name] MD, [name]   . Allergic rhinitis   . Anemia   . Arthritis   . Asthma   . Cough    occasional  . Cystocele 11/05/08  . Diverticulosis of colon   . DJD (degenerative joint disease)    no per pt  . Elevated alkaline phosphatase level   . GERD (gastroesophageal reflux disease)   . H/O urinary incontinence 11/06/2008  . H/O varicella   . Hard measles   . Hx of colonic polyps   . Hypercholesterolemia   . Hypertension   . Leg swelling    occasional  . 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 Overweight(278.02)   . Rectocele 11/06/2008   Large  . Sebaceous cyst    on back  . Sinusitis   . Venous insufficiency   . Wheezing    occasional  . Yeast infection    Past Surgical History:  Procedure Laterality Date  . ABDOMINAL HYSTERECTOMY  1988  . ANTERIOR AND POSTERIOR REPAIR  11/05/08  . APOGEE / PERIGEE REPAIR  10/2008   Dr. Octavio Manns  . bladder tack  2011  . BREAST SURGERY  1978   cyst removed from both removed  . COLONOSCOPY    .  ESOPHAGOGASTRODUODENOSCOPY      Social History:  reports that she has never smoked. She has never used smokeless tobacco. She reports that she drinks alcohol. She reports that she does not use drugs. Patient lives at home with her husband. She is quite ambulatory without any assistance   Allergies  Allergen Reactions  . Demerol Hives    All over the body   . Lisinopril-Hydrochlorothiazide Hives    All over the body  . Penicillins Hives    Has patient had a PCN reaction causing immediate rash,  facial/tongue/throat swelling, SOB or lightheadedness with hypotension: No Has patient had a PCN reaction causing severe rash involving mucus membranes or skin necrosis: No Has patient had a PCN reaction that required hospitalization: No Has patient had a PCN reaction occurring within the last 10 years: No If all of the above answers are "NO", then may proceed with Cephalosporin use.   All over the body    Family History  Problem Relation Age of Onset  . Aneurysm Mother   . Lung cancer Father   . Hypertension Daughter   . Hypertension Daughter   . Colon cancer Neg Hx   . Esophageal cancer Neg Hx   . Stomach cancer Neg Hx   . Rectal cancer Neg Hx   . Colon polyps Neg Hx     Patient is a daughter who had a DVT a few years ago. This was however after a traumatic leg injury  Prior to Admission medications   Medication Sig Start Date End Date Taking? Authorizing Provider  albuterol (PROVENTIL HFA;VENTOLIN HFA) 108 (90 Base) MCG/ACT inhaler Inhale 2 puffs into the lungs every 6 (six) hours as needed for wheezing or shortness of breath.   Yes [provider]  budesonide-formoterol (SYMBICORT) 160-4.5 MCG/ACT inhaler Inhale 2 puffs into the lungs 2 (two) times daily.     Yes [provider]  Calcium Carb-Cholecalciferol (CALCIUM 600 + D PO) Take by mouth. Take one tablet daily   Yes [provider]  cetirizine (ZYRTEC) 10 MG tablet Take 10 mg by mouth daily.   Yes [provider]  chlorpheniramine-HYDROcodone (TUSSIONEX PENNKINETIC ER) 10-8 MG/5ML SUER Take 5 mLs by mouth every 12 (twelve) hours as needed for cough. 03/07/17  Yes Eulas Post, Monica, DO  fluticasone (FLONASE) 50 MCG/ACT nasal spray 2 sprays daily. 12/10/14  Yes [provider]  furosemide (LASIX) 40 MG tablet TAKE 1 TABLET (40 MG TOTAL) BY MOUTH DAILY. 03/06/17  Yes Eulas Post, Monica, DO  gabapentin (NEURONTIN) 100 MG capsule Take 100 mg by mouth every other day.    Yes [provider]  KLOR-CON M20 20 MEQ tablet TAKE 2 TABLETS EVERY DAY FOR POTASSIUM SUPPLEMENT 12/23/16  Yes [name], DO  levocetirizine (XYZAL) 5 MG tablet Take 5 mg by mouth every evening.     Yes [provider]  losartan (COZAAR) 25 MG tablet Take 25 mg by mouth daily. Take with 50 mg tablet for a total of 75 mg daily.   Yes [provider]  losartan (COZAAR) 50 MG tablet TAKE 1 TABLET EVERY DAY FOR BLOOD PRESSURE 03/13/17  Yes Eulas Post, Monica, DO  montelukast (SINGULAIR) 10 MG tablet Take 10 mg by mouth at bedtime.     Yes [provider]  Multiple Vitamins-Minerals (MACULAR VITAMIN BENEFIT PO) Take 1 capsule by mouth 2 (two) times daily.     Yes [provider]  NIFEdipine (PROCARDIA XL/ADALAT-CC) 60 MG 24 hr tablet TAKE 1 TABLET EVERY  DAY 12/05/16  Yes [name], DO  NON FORMULARY Allergy shots once every other week   Yes [provider]  Omega-3 Fatty Acids (FISH OIL PO) Take by mouth daily.   Yes [provider]  pantoprazole (PROTONIX) 40 MG tablet Take one tablet by mouth twice daily for stomach 05/16/16  Yes Eulas Post, Monica, DO  pravastatin (PRAVACHOL) 40 MG tablet TAKE 1 TABLET BY MOUTH EVERY DAY 08/01/16  Yes Eulas Post, Monica, DO  psyllium (METAMUCIL) 58.6 % packet Take 1 packet by mouth daily as needed (constipation).   Yes [provider]  ranitidine (ZANTAC) 300 MG tablet Take 300 mg by mouth at bedtime.   Yes [provider]  SPIRIVA HANDIHALER 18 MCG inhalation capsule 18 mcg 2 (two) times daily. 10/30/14  Yes [provider]  vitamin B-12 (CYANOCOBALAMIN) 1000 MCG tablet Take 1,000 mcg by mouth daily.   Yes [provider]    Physical Exam: BP (!) 143/85 (BP Location: Left Arm)   Pulse (!) 117   Temp 98.5 F (36.9 C) (Oral)   Resp (!) 24   Ht 5\' 3"  (1.6 m)   Wt 103.4 kg (228 lb)   SpO2 95%   BMI 40.39 kg/m   General:  Alert and oriented 3, no acute distress  Eyes: Sclera nonicteric,  extraocular movements are intact  ENT: Normocephalic, atraumatic, mucous membranes are slightly dry  Neck: Supple, no JVD  Cardiovascular: Regular rate and rhythm, S1-S2  Respiratory: Clear to auscultation, with some decreased breath sounds bibasilar  Abdomen: Soft, obese, nontender, positive bowel sounds  Skin: No skin breaks, tears or lesions  Musculoskeletal: No clubbing or cyanosis, trace pitting edema from the knees down bilaterally, left greater than right  Psychiatric: Patient is appropriate, no evidence of psychoses  Neurologic: No focal deficits           Labs on Admission:  Basic Metabolic Panel:  Recent Labs Lab 03/26/17 1136  NA 141  K 4.1  CL 110  CO2 22  GLUCOSE 140*  BUN 13  CREATININE 0.96  CALCIUM 9.4   Liver Function Tests: No results for input(s): AST, ALT, ALKPHOS, BILITOT, PROT, ALBUMIN in the last 168 hours. No results for input(s): LIPASE, AMYLASE in the last 168 hours. No results for input(s): AMMONIA in the last 168 hours. CBC:  Recent Labs Lab 03/26/17 1136  WBC 10.7*  HGB 12.0  HCT 35.5*  MCV 79.2  PLT 191   Cardiac Enzymes: No results for input(s): CKTOTAL, CKMB, CKMBINDEX, TROPONINI in the last 168 hours.  BNP (last 3 results)  Recent Labs  03/26/17 1136  BNP 445.4*    ProBNP (last 3 results) No results for input(s): PROBNP in the last 8760 hours.  CBG: No results for input(s): GLUCAP in the last 168 hours.  Radiological Exams on Admission: Dg Chest 2 View  Result Date: 03/26/2017 CLINICAL DATA:  Shortness of breath.  Asthma. EXAM: CHEST  2 VIEW COMPARISON:  01/08/2014 FINDINGS: The heart size and mediastinal contours are within normal limits. Stable mild pleural thickening in lower left hemithorax. No evidence of pulmonary infiltrate or edema. No evidence of pleural effusion. IMPRESSION: No active cardiopulmonary disease. Electronically Signed   By: Earle Gell M.D.   On: 03/26/2017 12:32   Ct Angio Chest Pe W And/or Wo  Contrast  Result Date: 03/26/2017 CLINICAL DATA:  Asthma, short of breath. EXAM: CT ANGIOGRAPHY CHEST WITH CONTRAST TECHNIQUE: Multidetector CT imaging of the chest was performed using the standard protocol  during bolus administration of intravenous contrast. Multiplanar CT image reconstructions and MIPs were obtained to evaluate the vascular anatomy. CONTRAST:  100 mL Isovue COMPARISON:  None. FINDINGS: Cardiovascular: Bilateral tubular filling defects within the proximal RIGHT lower lobe pulmonary artery, LEFT lobe pulmonary artery, RIGHT upper lobe pulmonary arteries, and LEFT upper lobe pulmonary arteries. The clot burden is severe and partially occlusive to occlusive. There is evidence of RIGHT ventricular strain with the RIGHT ventricular to LEFT ventricular ratio > 1 (1.8) Mediastinum/Nodes: No axillary or supraclavicular adenopathy. No mediastinal hilar adenopathy. No pericardial fluid Lungs/Pleura: Bilateral small pleural effusions. Small focus of consolidation in the RIGHT lower lobe measuring 20 mm could represent small infarction. No pneumothorax Upper Abdomen: Limited view of the liver, kidneys, pancreas are unremarkable. Normal adrenal glands. Musculoskeletal: No aggressive osseous lesion Review of the MIP images confirms the above findings. IMPRESSION: 1. Severe bilateral pulmonary emboli involving the proximal pulmonary arteries of all of the LEFT and RIGHT lung lobes. 2. CT evidence of right heart strain (RV/LV Ratio = 1.8) consistent with at least submassive (intermediate risk) PE. The presence of right heart strain has been associated with an increased risk of morbidity and mortality. Please activate Code PE by paging [phone]. 3. Mild basilar effusion. Potential small infarction at the RIGHT lung base. Critical Value/emergent results were called by telephone at the time of interpretation on 03/26/2017 at 1:13 pm to Dr. [name] , who verbally acknowledged these results. Electronically Signed    By: Suzy Bouchard M.D.   On: 03/26/2017 13:14    EKG: Independently reviewed. Preliminary looks normal sinus rhythm   Assessment/Plan Present on Admission: . PE (pulmonary thromboembolism) ([place])  Active Problems:   PE (pulmonary thromboembolism) ([place]) causing acute respiratory failure with hypoxia: Have started IV heparin and pharmacy to dose xarelto. Unclear etiology. Despite submassive PE and evidence of right heart strain, patient actually looks quite comfortable and stable. Placed in stepdown at least for tonight. Check echocardiogram. We'll hold off on checking lower extremity Dopplers given that this would not really change our treatment or diagnosis. Suspect that she may end up needing oxygen at home short-term  Essential hypertension: Stable continue home meds.  Mild intermittent asthma: Continue when necessary nebs  Overweight: Patient meets criteria with BMI greater than 25   DVT prophylaxis: On full dose heparin   Code Status: Discussed with patient. She wishes to be a DO NOT RESUSCITATE   Family Communication: Younger daughter at the bedside   Disposition Plan: Patient will likely be here for several days, while we performed for workup. She also may end up needing oxygen at home   Consults called: None although case discussed with pulmonary by emergency room physician   Admission status: Given need for acute inpatient services and will be here past 2 nights, admit as inpatient     Annita Brod MD [facility] Pager [phone]  If 7PM-7AM, please contact night-coverage [email or web address] Password [facility]  03/26/2017, 4:43 PM

## 2017-03-27 ENCOUNTER — Encounter (HOSPITAL_COMMUNITY): Payer: Self-pay

## 2017-03-27 ENCOUNTER — Inpatient Hospital Stay (HOSPITAL_COMMUNITY): Payer: PPO

## 2017-03-27 DIAGNOSIS — I361 Nonrheumatic tricuspid (valve) insufficiency: Secondary | ICD-10-CM

## 2017-03-27 DIAGNOSIS — I2699 Other pulmonary embolism without acute cor pulmonale: Secondary | ICD-10-CM

## 2017-03-27 LAB — PROTIME-INR
INR: 1.17
PROTHROMBIN TIME: 15 s (ref 11.4–15.2)

## 2017-03-27 LAB — CBC
HCT: 32.3 % — ABNORMAL LOW (ref 36.0–46.0)
Hemoglobin: 10.5 g/dL — ABNORMAL LOW (ref 12.0–15.0)
MCH: 25.7 pg — AB (ref 26.0–34.0)
MCHC: 32.5 g/dL (ref 30.0–36.0)
MCV: 79 fL (ref 78.0–100.0)
PLATELETS: 169 10*3/uL (ref 150–400)
RBC: 4.09 MIL/uL (ref 3.87–5.11)
RDW: 15.1 % (ref 11.5–15.5)
WBC: 9.1 10*3/uL (ref 4.0–10.5)

## 2017-03-27 LAB — ECHOCARDIOGRAM COMPLETE
HEIGHTINCHES: 63 in
WEIGHTICAEL: 3648 [oz_av]

## 2017-03-27 LAB — BASIC METABOLIC PANEL
Anion gap: 9 (ref 5–15)
BUN: 13 mg/dL (ref 6–20)
CALCIUM: 8.6 mg/dL — AB (ref 8.9–10.3)
CO2: 20 mmol/L — ABNORMAL LOW (ref 22–32)
CREATININE: 0.85 mg/dL (ref 0.44–1.00)
Chloride: 110 mmol/L (ref 101–111)
Glucose, Bld: 123 mg/dL — ABNORMAL HIGH (ref 65–99)
Potassium: 4 mmol/L (ref 3.5–5.1)
SODIUM: 139 mmol/L (ref 135–145)

## 2017-03-27 LAB — HEPARIN LEVEL (UNFRACTIONATED): Heparin Unfractionated: 0.49 IU/mL (ref 0.30–0.70)

## 2017-03-27 MED ORDER — HEPARIN (PORCINE) IN NACL 100-0.45 UNIT/ML-% IJ SOLN
1300.0000 [IU]/h | INTRAMUSCULAR | Status: DC
  Administered 2017-03-28: 1300 [IU]/h via INTRAVENOUS
  Filled 2017-03-27: qty 250

## 2017-03-27 MED ORDER — PERFLUTREN LIPID MICROSPHERE
1.0000 mL | INTRAVENOUS | Status: AC | PRN
  Administered 2017-03-27: 2 mL via INTRAVENOUS
  Filled 2017-03-27: qty 10

## 2017-03-27 MED ORDER — HYDRALAZINE HCL 20 MG/ML IJ SOLN
5.0000 mg | Freq: Four times a day (QID) | INTRAMUSCULAR | Status: DC | PRN
  Administered 2017-03-27 – 2017-03-28 (×2): 5 mg via INTRAVENOUS
  Filled 2017-03-27: qty 1
  Filled 2017-03-27: qty 0.25
  Filled 2017-03-27: qty 1

## 2017-03-27 MED ORDER — PERFLUTREN LIPID MICROSPHERE
INTRAVENOUS | Status: AC
  Filled 2017-03-27: qty 10

## 2017-03-27 MED ORDER — HYDRALAZINE HCL 20 MG/ML IJ SOLN: 10.0000 mg | INTRAMUSCULAR | Status: DC | PRN

## 2017-03-27 MED ORDER — DIPHENHYDRAMINE HCL 25 MG PO CAPS: 25.0000 mg | ORAL_CAPSULE | Freq: Four times a day (QID) | ORAL | Status: DC | PRN

## 2017-03-27 NOTE — Progress Notes (Signed)
**  Preliminary report by tech**  Bilateral lower extremity venous duplex complete. There is no evidence of deep vein thrombosis involving the right and left lower extremities. There is evidence of age indeterminate superficial vein thrombosis involving the lesser saphenous vein of the right lower extremity. There is no evidence of superficial vein thrombosis involving the left lower extremity. There is no evidence of a Baker's cyst bilaterally. Results were given to the patient's nurse, Zoe.   03/27/17 8:57 AM Wendy Figueroa RVT

## 2017-03-27 NOTE — Progress Notes (Signed)
  Echocardiogram 2D Echocardiogram has been performed.  Wendy Figueroa 03/27/2017, 9:59 AM

## 2017-03-27 NOTE — Progress Notes (Addendum)
PROGRESS NOTE  Wendy Figueroa QZR:[id number] DOB: 1945/10/05 DOA: 03/26/2017 PCP: [name], DO  Brief History:  71 year old female with a history of hypertension, chronic cough, asthma, hyperlipidemia, dysphonia, laryngopharyngeal reflux presented with one-day history of fatigue and shortness of breath with minimal activity that began on 03/25/2017. The patient had been in her usual state of health without any complaints up until 03/25/2017. Her shortness of breath continued to worsen over the next 24 hours requiring presentation to the emergency department. The patient denied any fevers, chills, chest discomfort, dizziness, syncope. CT scan of the chest revealed bilateral pulmonary emboli with evidence of right heart strain. The patient was noted to be hypoxic at the time of presentation and required supplemental oxygen at 2 L to maintain oxygen saturation 92-93 percent. The patient was started on intravenous heparin and admitted to the stepdown unit. Critical care medicine was contacted, but they did not feel that the patient needed thrombolysis. The patient has remained hemodynamically stable.  Assessment/Plan: Submassive PE -Unprovoked by clinical history -PESI score 113--associated with higher risk of mortality -as such, continue IV heparin at least 48 hrs before transition to po AC -Echo -check Lupus anticoagulant, Factor V Leiden, Prothrombin gene mutation -venous duplex lower extremities -03/26/2017 CT angiogram chest--bilateral pulmonary emboli involving pulmonary arteries of all left and right lower lobes with CT evidence of RV strain. Possible small infarct RLL  Acute Respiratory Failure with hypoxia -secondary to PE -presently stable on 2 L -suspect she will need to go home on oxygen for short period  Essential HTN -Continue losartan -Holding nifedipine for now -BP remains acceptable  Asthma/chronic cough -Continue LABA  Dysphonia/Laryngopharyngeal  reflux -continue gabapentin -continue protonix -continue singulair  Hyperlipidemia -continue statin    Disposition Plan:   Home 2-3 days if stable Family Communication:  No Family at bedside--Total time spent 35 minutes.  Greater than 50% spent face to face counseling and coordinating care.   Consultants:  none  Code Status:  FULL   DVT Prophylaxis:  IV Heparin    Procedures: As Listed in Progress Note Above  Antibiotics: None    Subjective: Patient denies fevers, chills, headache, chest pain, the patient become short of breath with nausea, vomiting, diarrhea, abdominal pain, dysuria, hematuria, hematochezia, and melena.  The patient becomes short of breath with any type of minimal exertion   Objective: Vitals:   03/27/17 0400 03/27/17 0430 03/27/17 0433 03/27/17 0435  BP: (!) 184/111 (!) 171/118 (!) 163/100 (!) 167/89  Pulse: (!) 105 (!) 103 (!) 109 (!) 110  Resp: (!) 27 (!) 31 (!) 26 (!) 23  Temp: 98 F (36.7 C)     TempSrc: Oral     SpO2: 95% 95% 96% 98%  Weight:      Height:        Intake/Output Summary (Last 24 hours) at 03/27/17 0752 Last data filed at 03/27/17 0630  Gross per 24 hour  Intake          1135.64 ml  Output             1175 ml  Net           -39.36 ml   Weight change:  Exam:   General:  Pt is alert, follows commands appropriately, not in acute distress  HEENT: No icterus, No thrush, No neck mass, [place]/AT  Cardiovascular: RRR, S1/S2, no rubs, no gallops  Respiratory: Bibasilar crackles, right greater than left. No wheezing.  Abdomen: Soft/+BS, non tender, non distended, no guarding  Extremities: 1+ L>RLLE edema, No lymphangitis, No petechiae, No rashes, no synovitis   Data Reviewed: I have personally reviewed following labs and imaging studies Basic Metabolic Panel:  Recent Labs Lab 03/26/17 1136 03/27/17 0339  NA 141 139  K 4.1 4.0  CL 110 110  CO2 22 20*  GLUCOSE 140* 123*  BUN 13 13  CREATININE 0.96 0.85  CALCIUM  9.4 8.6*   Liver Function Tests: No results for input(s): AST, ALT, ALKPHOS, BILITOT, PROT, ALBUMIN in the last 168 hours. No results for input(s): LIPASE, AMYLASE in the last 168 hours. No results for input(s): AMMONIA in the last 168 hours. Coagulation Profile:  Recent Labs Lab 03/26/17 1130 03/27/17 0339  INR 1.07 1.17   CBC:  Recent Labs Lab 03/26/17 1136 03/27/17 0339  WBC 10.7* 9.1  HGB 12.0 10.5*  HCT 35.5* 32.3*  MCV 79.2 79.0  PLT 191 169   Cardiac Enzymes: No results for input(s): CKTOTAL, CKMB, CKMBINDEX, TROPONINI in the last 168 hours. BNP: Invalid input(s): POCBNP CBG: No results for input(s): GLUCAP in the last 168 hours. HbA1C: No results for input(s): HGBA1C in the last 72 hours. Urine analysis:    Component Value Date/Time   COLORURINE YELLOW 09/09/2016 1126   APPEARANCEUR CLEAR 09/09/2016 1126   APPEARANCEUR Cloudy (A) 05/27/2015 0844   LABSPEC 1.012 09/09/2016 1126   PHURINE 7.5 09/09/2016 1126   GLUCOSEU NEGATIVE 09/09/2016 1126   [place] 09/09/2016 1126   [place] 09/09/2016 1126   BILIRUBINUR Negative 05/27/2015 [place] 09/09/2016 1126   [place] 09/09/2016 1126   NITRITE NEGATIVE 09/09/2016 1126   LEUKOCYTESUR NEGATIVE 09/09/2016 1126   LEUKOCYTESUR 3+ (A) 05/27/2015 0844   Sepsis Labs: @LABRCNTIP (procalcitonin:4,lacticidven:4) ) Recent Results (from the past 240 hour(s))  MRSA PCR Screening     Status: None   Collection Time: 03/26/17  4:50 PM  Result Value Ref Range Status   MRSA by PCR NEGATIVE NEGATIVE Final    Comment:        The GeneXpert MRSA Assay (FDA approved for NASAL specimens only), is one component of a comprehensive MRSA colonization surveillance program. It is not intended to diagnose MRSA infection nor to guide or monitor treatment for MRSA infections.      Scheduled Meds: . fluticasone  2 spray Each Nare Daily  . gabapentin  100 mg Oral QODAY  .  loratadine  10 mg Oral Daily  . losartan  75 mg Oral Daily  . mometasone-formoterol  2 puff Inhalation BID  . montelukast  10 mg Oral QHS  . pantoprazole  40 mg Oral BID  . pravastatin  40 mg Oral q1800  . sodium chloride flush  3 mL Intravenous Q12H   Continuous Infusions: . sodium chloride 50 mL/hr at 03/26/17 1900  . heparin 1,300 Units/hr (03/27/17 0644)    Procedures/Studies: Dg Chest 2 View  Result Date: 03/26/2017 CLINICAL DATA:  Shortness of breath.  Asthma. EXAM: CHEST  2 VIEW COMPARISON:  01/08/2014 FINDINGS: The heart size and mediastinal contours are within normal limits. Stable mild pleural thickening in lower left hemithorax. No evidence of pulmonary infiltrate or edema. No evidence of pleural effusion. IMPRESSION: No active cardiopulmonary disease. Electronically Signed   By: Earle Gell M.D.   On: 03/26/2017 12:32   Ct Angio Chest Pe W And/or Wo Contrast  Result Date: 03/26/2017 CLINICAL DATA:  Asthma, short of breath. EXAM: CT ANGIOGRAPHY CHEST WITH CONTRAST  TECHNIQUE: Multidetector CT imaging of the chest was performed using the standard protocol during bolus administration of intravenous contrast. Multiplanar CT image reconstructions and MIPs were obtained to evaluate the vascular anatomy. CONTRAST:  100 mL Isovue COMPARISON:  None. FINDINGS: Cardiovascular: Bilateral tubular filling defects within the proximal RIGHT lower lobe pulmonary artery, LEFT lobe pulmonary artery, RIGHT upper lobe pulmonary arteries, and LEFT upper lobe pulmonary arteries. The clot burden is severe and partially occlusive to occlusive. There is evidence of RIGHT ventricular strain with the RIGHT ventricular to LEFT ventricular ratio > 1 (1.8) Mediastinum/Nodes: No axillary or supraclavicular adenopathy. No mediastinal hilar adenopathy. No pericardial fluid Lungs/Pleura: Bilateral small pleural effusions. Small focus of consolidation in the RIGHT lower lobe measuring 20 mm could represent small  infarction. No pneumothorax Upper Abdomen: Limited view of the liver, kidneys, pancreas are unremarkable. Normal adrenal glands. Musculoskeletal: No aggressive osseous lesion Review of the MIP images confirms the above findings. IMPRESSION: 1. Severe bilateral pulmonary emboli involving the proximal pulmonary arteries of all of the LEFT and RIGHT lung lobes. 2. CT evidence of right heart strain (RV/LV Ratio = 1.8) consistent with at least submassive (intermediate risk) PE. The presence of right heart strain has been associated with an increased risk of morbidity and mortality. Please activate Code PE by paging [phone]. 3. Mild basilar effusion. Potential small infarction at the RIGHT lung base. Critical Value/emergent results were called by telephone at the time of interpretation on 03/26/2017 at 1:13 pm to Dr. [name] , who verbally acknowledged these results. Electronically Signed   By: Suzy Bouchard M.D.   On: 03/26/2017 13:14    Nancey Kreitz, DO  [facility] Pager [phone]  If 7PM-7AM, please contact night-coverage [email or web address] Password [email or web address] 03/27/2017, 7:52 AM   LOS: 1 day

## 2017-03-27 NOTE — Care Management Note (Signed)
Case Management Note  Patient Details  Name: Wendy Figueroa MRN: [id number] Date of Birth: Jan 15, 1946  Subjective/Objective:        Pulmonary emboli and iv heparin drip/hypoxia.            Action/Plan: Date:  March 27, 2017 Chart reviewed for concurrent status and case management needs. Will continue to follow patient progress. Discharge Planning: following for needs/lives at home with spouse. Expected discharge date: [id number] Velva Harman, BSN, [place], [place]  Expected Discharge Date:  03/28/17               Expected Discharge Plan:  Home/Self Care  In-House Referral:     Discharge planning Services  CM Consult  Post Acute Care Choice:    Choice offered to:     DME Arranged:    DME Agency:     HH Arranged:    HH Agency:     Status of Service:  In process, will continue to follow  If discussed at Long Length of Stay Meetings, dates discussed:    Additional Comments:  Leeroy Cha, RN 03/27/2017, 9:58 AM

## 2017-03-27 NOTE — Progress Notes (Signed)
[place] for Heparin Indication: pulmonary embolus  Allergies  Allergen Reactions  . Demerol Hives    All over the body   . Lisinopril-Hydrochlorothiazide Hives    All over the body  . Penicillins Hives    Has patient had a PCN reaction causing immediate rash, facial/tongue/throat swelling, SOB or lightheadedness with hypotension: No Has patient had a PCN reaction causing severe rash involving mucus membranes or skin necrosis: No Has patient had a PCN reaction that required hospitalization: No Has patient had a PCN reaction occurring within the last 10 years: No If all of the above answers are "NO", then may proceed with Cephalosporin use.   All over the body    Patient Measurements: Height: 5\' 3"  (160 cm) Weight: 228 lb (103.4 kg) IBW/kg (Calculated) : 52.4 Heparin Dosing Weight: 77 kg  Vital Signs: Temp: 98 F (36.7 C) (08/06 0400) Temp Source: Oral (08/06 0400) BP: 167/89 (08/06 0435) Pulse Rate: 110 (08/06 0435)  Labs:  Recent Labs  03/26/17 1130 03/26/17 1136 03/26/17 2148 03/27/17 0339  HGB  --  12.0  --  10.5*  HCT  --  35.5*  --  32.3*  PLT  --  191  --  169  APTT 28  --   --   --   LABPROT 13.9  --   --  15.0  INR 1.07  --   --  1.17  HEPARINUNFRC  --   --  0.46  --   CREATININE  --  0.96  --  0.85    Estimated Creatinine Clearance: 67.7 mL/min (by C-G formula based on SCr of 0.85 mg/dL).   Assessment:  71 yr female with PMH significant for asthma, HTN, HLD presents with shortness of breath.    CTAngio shows severe bilateral PE with evidence of right heart strain   Pharmacy consulted to dose IV heparin  Patient on no oral anticoagulation PTA  03/27/2017 - To continue UFH for 24-48 more hours before transitioning to Xarelto.  - heparin level therapeutic at 0.49 on heparin 1300 units/hr - Hg 12>10.5, pltc WNL.  - No bleeding reported  Goal of Therapy:  Heparin level 0.3-0.7 units/ml Monitor platelets by  anticoagulation protocol: Yes   Plan:   Continue Heparin infusion @ 1300 units/hr  Follow heparin level & CBC daily   F/u for transition to [place], Pharm.D. [phone] 03/27/2017 10:07 AM

## 2017-03-27 NOTE — Progress Notes (Signed)
ANTICOAGULATION CONSULT NOTE - Follow Up Consult  Pharmacy Consult for Heparin---> Xarelto Indication: pulmonary embolus   Allergies  Allergen Reactions  . Demerol Hives    All over the body   . Lisinopril-Hydrochlorothiazide Hives    All over the body  . Penicillins Hives    Has patient had a PCN reaction causing immediate rash, facial/tongue/throat swelling, SOB or lightheadedness with hypotension: No Has patient had a PCN reaction causing severe rash involving mucus membranes or skin necrosis: No Has patient had a PCN reaction that required hospitalization: No Has patient had a PCN reaction occurring within the last 10 years: No If all of the above answers are "NO", then may proceed with Cephalosporin use.   All over the body    Patient Measurements: Height: 5\' 3"  (160 cm) Weight: 228 lb (103.4 kg) IBW/kg (Calculated) : 52.4 Heparin Dosing Weight:   Vital Signs: Temp: 98 F (36.7 C) (08/06 0400) Temp Source: Oral (08/06 0400) BP: 167/89 (08/06 0435) Pulse Rate: 110 (08/06 0435)  Labs:  Recent Labs  03/26/17 1130 03/26/17 1136 03/26/17 2148 03/27/17 0339  HGB  --  12.0  --  10.5*  HCT  --  35.5*  --  32.3*  PLT  --  191  --  169  APTT 28  --   --   --   LABPROT 13.9  --   --  15.0  INR 1.07  --   --  1.17  HEPARINUNFRC  --   --  0.46  --   CREATININE  --  0.96  --  0.85    Estimated Creatinine Clearance: 67.7 mL/min (by C-G formula based on SCr of 0.85 mg/dL).   Medications:  Infusions:  . sodium chloride 50 mL/hr at 03/26/17 1900  . heparin 1,300 Units/hr (03/26/17 2041)    Assessment: Patient with 8/5 PM heparin level at goal.  Plan to change to rivaroxaban 8/6 AM.  No heparin issues noted.  Goal of Therapy:  Heparin level 0.3-0.7 units/ml Monitor platelets by anticoagulation protocol: Yes   Plan:  Continue heparin drip at current rate F/u with change to rivaroxaban dosing 8/6 AM  Tyler Deis, Shea Stakes [place] 03/27/2017,5:33 AM

## 2017-03-28 ENCOUNTER — Encounter (HOSPITAL_COMMUNITY): Payer: Self-pay | Admitting: Nurse Practitioner

## 2017-03-28 DIAGNOSIS — J9601 Acute respiratory failure with hypoxia: Secondary | ICD-10-CM

## 2017-03-28 DIAGNOSIS — J45909 Unspecified asthma, uncomplicated: Secondary | ICD-10-CM

## 2017-03-28 LAB — BASIC METABOLIC PANEL
Anion gap: 6 (ref 5–15)
BUN: 13 mg/dL (ref 6–20)
CO2: 21 mmol/L — ABNORMAL LOW (ref 22–32)
CREATININE: 0.72 mg/dL (ref 0.44–1.00)
Calcium: 8.8 mg/dL — ABNORMAL LOW (ref 8.9–10.3)
Chloride: 115 mmol/L — ABNORMAL HIGH (ref 101–111)
GFR calc Af Amer: >60 mL/min (ref >60)
GLUCOSE: 105 mg/dL — AB (ref 65–99)
POTASSIUM: 3.8 mmol/L (ref 3.5–5.1)
Sodium: 142 mmol/L (ref 135–145)

## 2017-03-28 LAB — CBC
HCT: 31.9 % — ABNORMAL LOW (ref 36.0–46.0)
Hemoglobin: 10.7 g/dL — ABNORMAL LOW (ref 12.0–15.0)
MCH: 26.8 pg (ref 26.0–34.0)
MCHC: 33.5 g/dL (ref 30.0–36.0)
MCV: 79.8 fL (ref 78.0–100.0)
PLATELETS: 185 10*3/uL (ref 150–400)
RBC: 4 MIL/uL (ref 3.87–5.11)
RDW: 15 % (ref 11.5–15.5)
WBC: 7.9 10*3/uL (ref 4.0–10.5)

## 2017-03-28 LAB — HEPARIN LEVEL (UNFRACTIONATED): Heparin Unfractionated: 0.28 IU/mL — ABNORMAL LOW (ref 0.30–0.70)

## 2017-03-28 MED ORDER — RIVAROXABAN (XARELTO) EDUCATION KIT FOR DVT/PE PATIENTS
PACK | Freq: Once | Status: DC
  Filled 2017-03-28: qty 1

## 2017-03-28 MED ORDER — NIFEDIPINE ER 60 MG PO TB24
60.0000 mg | ORAL_TABLET | Freq: Every day | ORAL | Status: DC
  Administered 2017-03-28 – 2017-03-29 (×2): 60 mg via ORAL
  Filled 2017-03-28 (×2): qty 1

## 2017-03-28 MED ORDER — HEPARIN (PORCINE) IN NACL 100-0.45 UNIT/ML-% IJ SOLN: 1450.0000 [IU]/h | INTRAMUSCULAR | Status: AC

## 2017-03-28 MED ORDER — RIVAROXABAN 15 MG PO TABS
15.0000 mg | ORAL_TABLET | Freq: Two times a day (BID) | ORAL | Status: DC
  Administered 2017-03-28 – 2017-03-29 (×2): 15 mg via ORAL
  Filled 2017-03-28 (×2): qty 1

## 2017-03-28 MED ORDER — TRAMADOL HCL 50 MG PO TABS: 50.0000 mg | ORAL_TABLET | Freq: Four times a day (QID) | ORAL | Status: DC | PRN

## 2017-03-28 MED ORDER — TIOTROPIUM BROMIDE MONOHYDRATE 18 MCG IN CAPS
18.0000 ug | ORAL_CAPSULE | Freq: Every day | RESPIRATORY_TRACT | Status: DC
  Administered 2017-03-28 – 2017-03-29 (×2): 18 ug via RESPIRATORY_TRACT
  Filled 2017-03-28: qty 5

## 2017-03-28 NOTE — Progress Notes (Signed)
Assumed care of patient from previous RN.  Agree with previous RN's assessment of patient.  Patient oriented to unit and equipment.  Patient stable, comfortable in bed with husband at bedside at this time.  Telemetry verified with CMT.  Will continue to monitor patient.

## 2017-03-28 NOTE — Progress Notes (Addendum)
PROGRESS NOTE  Wendy Figueroa PIR:[id number] DOB: 26-Sep-1945 DOA: 03/26/2017 PCP: [name], DO  Brief History:  71 year old female with a history of hypertension, chronic cough, asthma, hyperlipidemia, dysphonia, laryngopharyngeal reflux presented with one-day history of fatigue and shortness of breath with minimal activity that began on 03/25/2017. The patient had been in her usual state of health without any complaints up until 03/25/2017. Her shortness of breath continued to worsen over the next 24 hours requiring presentation to the emergency department. The patient denied any fevers, chills, chest discomfort, dizziness, syncope. CT scan of the chest revealed bilateral pulmonary emboli with evidence of right heart strain. The patient was noted to be hypoxic at the time of presentation and required supplemental oxygen at 2 L to maintain oxygen saturation 92-93 percent. The patient was started on intravenous heparin and admitted to the stepdown unit. Critical care medicine was contacted, but they did not feel that the patient needed thrombolysis. The patient has remained hemodynamically stable.  Assessment/Plan: Submassive PE with cor pulmonale -Unprovoked by clinical history -PESI score 113--associated with higher risk of mortality -as such, continued IV heparin at least 48 hrs before transition to po AC -Echo--EF 50-55%, no WMA, severe RV dilatation with reduced RV systolic function -check Lupus anticoagulant, Factor V Leiden, Prothrombin gene mutation -venous duplex lower extremities-->superficial thrombus in R-lesser saphenous v. -03/26/2017 CT angiogram chest--bilateral pulmonary emboli involving pulmonary arteries of all left and right lower lobes with CT evidence of RV strain. Possible small infarct RLL -transition to po rivaroxaban 8/718 evening  Acute Respiratory Failure with hypoxia -secondary to PE -initially stable on 2 L-->weaned to RA  Essential  HTN -Continue losartan -restart nifedipine  Asthma/chronic cough -Continue LABA  Dysphonia/Laryngopharyngeal reflux -continue gabapentin -continue protonix -continue singulair  Hyperlipidemia -continue statin  Right shoulder pain -likely degenerative tear in rotator cuff -PT eval -symptomatic tx,--tramadol prn pain -possible out patient ortho eval  Morbid Obesity -BMI 40.5    Disposition Plan:   Home 03/29/17 if stable Family Communication:  No Family at bedside   Consultants:  none  Code Status:  FULL   DVT Prophylaxis:  IV Heparin--->po rivaroxaban   Procedures: As Listed in Progress Note Above  Antibiotics: None    Subjective: Patient is feeling better. She still has some dyspnea on with exertion. Denies any headache, chest pain, nausea, vomiting, diarrhea, abdominal pain. No dysuria or hematuria or hematochezia. She complains of right shoulder pain for the past 3 weeks without any recent injury.  Objective: Vitals:   03/28/17 0551 03/28/17 0710 03/28/17 0800 03/28/17 0900  BP: (!) 161/84  (!) 178/108   Pulse: 96  (!) 102   Resp: (!) 22  (!) 24 (!) 25  Temp:  98.6 F (37 C)    TempSrc:  Oral    SpO2: 97%  97% 97%  Weight:      Height:        Intake/Output Summary (Last 24 hours) at 03/28/17 1006 Last data filed at 03/28/17 0800  Gross per 24 hour  Intake          1548.32 ml  Output              800 ml  Net           748.32 ml   Weight change:  Exam:   General:  Pt is alert, follows commands appropriately, not in acute distress  HEENT: No icterus, No thrush, No neck mass,  [place]/AT  Cardiovascular: RRR, S1/S2, no rubs, no gallops  Respiratory: bibasilar crackles, now wheeze  Abdomen: Soft/+BS, non tender, non distended, no guarding  Extremities: 1 + LE edema, No lymphangitis, No petechiae, No rashes, no synovitis   Data Reviewed: I have personally reviewed following labs and imaging studies Basic Metabolic  Panel:  Recent Labs Lab 03/26/17 1136 03/27/17 0339 03/28/17 0339  NA 141 139 142  K 4.1 4.0 3.8  CL 110 110 115*  CO2 22 20* 21*  GLUCOSE 140* 123* 105*  BUN 13 13 13   CREATININE 0.96 0.85 0.72  CALCIUM 9.4 8.6* 8.8*   Liver Function Tests: No results for input(s): AST, ALT, ALKPHOS, BILITOT, PROT, ALBUMIN in the last 168 hours. No results for input(s): LIPASE, AMYLASE in the last 168 hours. No results for input(s): AMMONIA in the last 168 hours. Coagulation Profile:  Recent Labs Lab 03/26/17 1130 03/27/17 0339  INR 1.07 1.17   CBC:  Recent Labs Lab 03/26/17 1136 03/27/17 0339 03/28/17 0339  WBC 10.7* 9.1 7.9  HGB 12.0 10.5* 10.7*  HCT 35.5* 32.3* 31.9*  MCV 79.2 79.0 79.8  PLT 191 169 185   Cardiac Enzymes: No results for input(s): CKTOTAL, CKMB, CKMBINDEX, TROPONINI in the last 168 hours. BNP: Invalid input(s): POCBNP CBG: No results for input(s): GLUCAP in the last 168 hours. HbA1C: No results for input(s): HGBA1C in the last 72 hours. Urine analysis:    Component Value Date/Time   COLORURINE YELLOW 09/09/2016 1126   APPEARANCEUR CLEAR 09/09/2016 1126   APPEARANCEUR Cloudy (A) 05/27/2015 0844   LABSPEC 1.012 09/09/2016 1126   PHURINE 7.5 09/09/2016 1126   GLUCOSEU NEGATIVE 09/09/2016 1126   [place] 09/09/2016 1126   [place] 09/09/2016 1126   BILIRUBINUR Negative 05/27/2015 [place] 09/09/2016 [place] 09/09/2016 1126   NITRITE NEGATIVE 09/09/2016 [place] 09/09/2016 1126   LEUKOCYTESUR 3+ (A) 05/27/2015 0844   Sepsis Labs: @LABRCNTIP (procalcitonin:4,lacticidven:4) ) Recent Results (from the past 240 hour(s))  MRSA PCR Screening     Status: None   Collection Time: 03/26/17  4:50 PM  Result Value Ref Range Status   MRSA by PCR NEGATIVE NEGATIVE Final    Comment:        The GeneXpert MRSA Assay (FDA approved for NASAL specimens only), is one component of  a comprehensive MRSA colonization surveillance program. It is not intended to diagnose MRSA infection nor to guide or monitor treatment for MRSA infections.      Scheduled Meds: . fluticasone  2 spray Each Nare Daily  . gabapentin  100 mg Oral QODAY  . loratadine  10 mg Oral Daily  . losartan  75 mg Oral Daily  . mometasone-formoterol  2 puff Inhalation BID  . montelukast  10 mg Oral QHS  . NIFEdipine  60 mg Oral Daily  . pantoprazole  40 mg Oral BID  . pravastatin  40 mg Oral q1800  . Rivaroxaban  15 mg Oral BID WC  . sodium chloride flush  3 mL Intravenous Q12H   Continuous Infusions: . sodium chloride 50 mL/hr at 03/28/17 0600  . heparin 1,450 Units/hr (03/28/17 0738)    Procedures/Studies: Dg Chest 2 View  Result Date: 03/26/2017 CLINICAL DATA:  Shortness of breath.  Asthma. EXAM: CHEST  2 VIEW COMPARISON:  01/08/2014 FINDINGS: The heart size and mediastinal contours are within normal limits. Stable mild pleural thickening in lower left hemithorax. No evidence of pulmonary infiltrate or edema.  No evidence of pleural effusion. IMPRESSION: No active cardiopulmonary disease. Electronically Signed   By: Earle Gell M.D.   On: 03/26/2017 12:32   Ct Angio Chest Pe W And/or Wo Contrast  Result Date: 03/26/2017 CLINICAL DATA:  Asthma, short of breath. EXAM: CT ANGIOGRAPHY CHEST WITH CONTRAST TECHNIQUE: Multidetector CT imaging of the chest was performed using the standard protocol during bolus administration of intravenous contrast. Multiplanar CT image reconstructions and MIPs were obtained to evaluate the vascular anatomy. CONTRAST:  100 mL Isovue COMPARISON:  None. FINDINGS: Cardiovascular: Bilateral tubular filling defects within the proximal RIGHT lower lobe pulmonary artery, LEFT lobe pulmonary artery, RIGHT upper lobe pulmonary arteries, and LEFT upper lobe pulmonary arteries. The clot burden is severe and partially occlusive to occlusive. There is evidence of RIGHT ventricular  strain with the RIGHT ventricular to LEFT ventricular ratio > 1 (1.8) Mediastinum/Nodes: No axillary or supraclavicular adenopathy. No mediastinal hilar adenopathy. No pericardial fluid Lungs/Pleura: Bilateral small pleural effusions. Small focus of consolidation in the RIGHT lower lobe measuring 20 mm could represent small infarction. No pneumothorax Upper Abdomen: Limited view of the liver, kidneys, pancreas are unremarkable. Normal adrenal glands. Musculoskeletal: No aggressive osseous lesion Review of the MIP images confirms the above findings. IMPRESSION: 1. Severe bilateral pulmonary emboli involving the proximal pulmonary arteries of all of the LEFT and RIGHT lung lobes. 2. CT evidence of right heart strain (RV/LV Ratio = 1.8) consistent with at least submassive (intermediate risk) PE. The presence of right heart strain has been associated with an increased risk of morbidity and mortality. Please activate Code PE by paging [phone]. 3. Mild basilar effusion. Potential small infarction at the RIGHT lung base. Critical Value/emergent results were called by telephone at the time of interpretation on 03/26/2017 at 1:13 pm to Dr. [name] , who verbally acknowledged these results. Electronically Signed   By: Suzy Bouchard M.D.   On: 03/26/2017 13:14    Taneisha Fuson, DO  [facility] Pager [phone]  If 7PM-7AM, please contact night-coverage [email or web address] Password [email or web address] 03/28/2017, 10:06 AM   LOS: 2 days

## 2017-03-28 NOTE — Progress Notes (Signed)
Provided emotional support with pt spouse in hallway while pt being transferred to [place], [place]

## 2017-03-28 NOTE — Progress Notes (Addendum)
[place] for Heparin>>xarelto Indication: pulmonary embolus  Allergies  Allergen Reactions  . Demerol Hives    All over the body   . Lisinopril-Hydrochlorothiazide Hives    All over the body  . Penicillins Hives    Has patient had a PCN reaction causing immediate rash, facial/tongue/throat swelling, SOB or lightheadedness with hypotension: No Has patient had a PCN reaction causing severe rash involving mucus membranes or skin necrosis: No Has patient had a PCN reaction that required hospitalization: No Has patient had a PCN reaction occurring within the last 10 years: No If all of the above answers are "NO", then may proceed with Cephalosporin use.   All over the body    Patient Measurements: Height: 5\' 3"  (160 cm) Weight: 228 lb (103.4 kg) IBW/kg (Calculated) : 52.4 Heparin Dosing Weight: 77 kg  Vital Signs: Temp: 98.6 F (37 C) (08/07 0710) Temp Source: Oral (08/07 0710) BP: 161/84 (08/07 0551) Pulse Rate: 96 (08/07 0551)  Labs:  Recent Labs  03/26/17 1130  03/26/17 1136 03/26/17 2148 03/27/17 0339 03/27/17 0838 03/28/17 0339  HGB  --   < > 12.0  --  10.5*  --  10.7*  HCT  --   --  35.5*  --  32.3*  --  31.9*  PLT  --   --  191  --  169  --  185  APTT 28  --   --   --   --   --   --   LABPROT 13.9  --   --   --  15.0  --   --   INR 1.07  --   --   --  1.17  --   --   HEPARINUNFRC  --   --   --  0.46  --  0.49 0.28*  CREATININE  --   --  0.96  --  0.85  --  0.72  < > = values in this interval not displayed.  Estimated Creatinine Clearance: 72 mL/min (by C-G formula based on SCr of 0.72 mg/dL).   Assessment:  71 yr female with PMH significant for asthma, HTN, HLD presents with shortness of breath.    CTAngio shows severe bilateral PE with evidence of right heart strain   Pharmacy consulted to dose IV heparin  Patient on no oral anticoagulation PTA  03/28/2017 - - heparin level slightly low at 0.28 on heparin 1300  units/hr - Hg 12>10.5>10.7, pltc WNL.  - No bleeding reported - to transition to xarelto at 1700 today per Dr Tat  Goal of Therapy:  Heparin level 0.3-0.7 units/ml Monitor platelets by anticoagulation protocol: Yes   Plan:  -Heparin drip increased to 1450 units/hr at 0738 am - continue heparin drip at 1450 units/hr unit 1700 then DC drip and start xarelto - xarelto 15 mg po BID x 21 days then xarelto 20 mg qsupper  Eudelia Bunch, Pharm.D. [phone] 03/28/2017 7:10 AM

## 2017-03-28 NOTE — Discharge Instructions (Signed)
Information on my medicine - XARELTO (rivaroxaban)  This medication education was reviewed with me or my healthcare representative as part of my discharge preparation.   WHY WAS XARELTO PRESCRIBED FOR YOU? Xarelto was prescribed to treat blood clots that may have been found in the veins of your legs (deep vein thrombosis) or in your lungs (pulmonary embolism) and to reduce the risk of them occurring again.  What do you need to know about Xarelto? The starting dose is one 15 mg tablet taken TWICE daily with food for the FIRST 21 DAYS then on 8/29 or after all 15 mg tablets have been taken,  the dose is changed to one 20 mg tablet taken ONCE A DAY with your evening meal.  DO NOT stop taking Xarelto without talking to the health care provider who prescribed the medication.  Refill your prescription for 20 mg tablets before you run out.  After discharge, you should have regular check-up appointments with your healthcare provider that is prescribing your Xarelto.  In the future your dose may need to be changed if your kidney function changes by a significant amount.  What do you do if you miss a dose? If you are taking Xarelto TWICE DAILY and you miss a dose, take it as soon as you remember. You may take two 15 mg tablets (total 30 mg) at the same time then resume your regularly scheduled 15 mg twice daily the next day.  If you are taking Xarelto ONCE DAILY and you miss a dose, take it as soon as you remember on the same day then continue your regularly scheduled once daily regimen the next day. Do not take two doses of Xarelto at the same time.   Important Safety Information Xarelto is a blood thinner medicine that can cause bleeding. You should call your healthcare provider right away if you experience any of the following: ? Bleeding from an injury or your nose that does not stop. ? Unusual colored urine (red or dark brown) or unusual colored stools (red or black). ? Unusual bruising  for unknown reasons. ? A serious fall or if you hit your head (even if there is no bleeding).  Some medicines may interact with Xarelto and might increase your risk of bleeding while on Xarelto. To help avoid this, consult your healthcare provider or pharmacist prior to using any new prescription or non-prescription medications, including herbals, vitamins, non-steroidal anti-inflammatory drugs (NSAIDs) and supplements.  This website has more information on Xarelto: [email or web address].[email or web address]

## 2017-03-29 DIAGNOSIS — I10 Essential (primary) hypertension: Secondary | ICD-10-CM

## 2017-03-29 DIAGNOSIS — R490 Dysphonia: Secondary | ICD-10-CM

## 2017-03-29 DIAGNOSIS — J4520 Mild intermittent asthma, uncomplicated: Secondary | ICD-10-CM

## 2017-03-29 DIAGNOSIS — I2609 Other pulmonary embolism with acute cor pulmonale: Principal | ICD-10-CM

## 2017-03-29 LAB — CBC
HCT: 32.9 % — ABNORMAL LOW (ref 36.0–46.0)
HEMOGLOBIN: 10.9 g/dL — AB (ref 12.0–15.0)
MCH: 25.8 pg — AB (ref 26.0–34.0)
MCHC: 33.1 g/dL (ref 30.0–36.0)
MCV: 77.8 fL — ABNORMAL LOW (ref 78.0–100.0)
Platelets: 210 10*3/uL (ref 150–400)
RBC: 4.23 MIL/uL (ref 3.87–5.11)
RDW: 14.9 % (ref 11.5–15.5)
WBC: 6.7 10*3/uL (ref 4.0–10.5)

## 2017-03-29 MED ORDER — RIVAROXABAN (XARELTO) VTE STARTER PACK (15 & 20 MG): ORAL_TABLET | ORAL | 0 refills | Status: DC

## 2017-03-29 MED ORDER — RIVAROXABAN (XARELTO) EDUCATION KIT FOR DVT/PE PATIENTS: 1.0000 | PACK | Freq: Once | 0 refills | Status: AC

## 2017-03-29 NOTE — Care Management Note (Signed)
Case Management Note  Patient Details  Name: Wendy Figueroa MRN: [id number] Date of Birth: 12/01/1945  Subjective/Objective: 71 y/o f admitted w/PE. From home. PT-recc outpatient PT-provided patient w/otpt PT locations-patient voiced understanding. MD to provide w/script for otpt PT. No further CM needs.                   Action/Plan:d/c home.   Expected Discharge Date:  03/29/17               Expected Discharge Plan:  OP Rehab  In-House Referral:     Discharge planning Services  CM Consult  Post Acute Care Choice:    Choice offered to:     DME Arranged:    DME Agency:     HH Arranged:    HH Agency:     Status of Service:  Completed, signed off  If discussed at [employer] of Stay Meetings, dates discussed:    Additional Comments:  Dessa Phi, RN 03/29/2017, 11:59 AM

## 2017-03-29 NOTE — Progress Notes (Signed)
Physical Therapy Evaluation AND DISCHARGE  Patient Details Name: Wendy Figueroa MRN: [id number] DOB: [birth year] Today's Date: 03/29/2017   History of Present Illness   Pt. is a 71 y.o. female with a history of hypertension, chronic cough, asthma, hyperlipidemia, dysphonia, laryngopharyngeal reflux who presented with one-day history of fatigue and SOB with minimal activity that began on 03/25/2017. Pt reports SOB continued to worsen over the next 24 hours requiring presentation to the emergency department. CT scan of the chest revealed bilateral pulmonary emboli with evidence of right heart strain. Pt was admitted for pulmonary embolus.   Clinical Impression  Pt evaluated by PT with no further acute PT needs identified. All education has been completed and the patient has no further questions. See below for any follow up PT or equipment needs. Pt tolerated session well. Pt complains of R shoulder pain. Pt has AROM flexion of approx. 90 degrees with internal rotation and abduction within functional limits. Pt has slightly increased pain with open can and empty can special tests as wall as point tenderness upon palpation around infraspinatus. Pt also demonstrates forward trunk flexion and rounded shoulders in sitting. Pt was provided with postural cues to help improve shoulder function. Pt was informed she would benefit in seeing outpatient PT if problems persist. Pt was also able to ambulate 120 feet on room air with SPO2 remaining above 96%. Pt HR elevated to 127 bpm and pt was informed to gradually resume activity and to self monitor HR.     Follow Up Recommendations Outpatient PT    Equipment Recommendations  None recommended by PT    Recommendations for Other Services       Precautions / Restrictions Restrictions Weight Bearing Restrictions: No      Mobility  Bed Mobility               General bed mobility comments: Pt was at EOB when PT arrived   Transfers Overall transfer  level: Needs assistance Equipment used: None Transfers: Sit to/from Stand Sit to Stand: Min guard         General transfer comment: Min guard provided for safety, pt able to stand without AD   Ambulation/Gait Ambulation/Gait assistance: Min guard Ambulation Distance (Feet): 120 Feet Assistive device: None Gait Pattern/deviations: Trunk flexed;Step-through pattern     General Gait Details: Pt has flexed trunk during ambulation and was provided with verbal cues for upright posture, O2 sats monitored and remained above 96% room air during ambulation, HR increased 127 bpm during ambulation  Stairs            Wheelchair Mobility    Modified Rankin (Stroke Patients Only)       Balance Overall balance assessment: Independent                                           Pertinent Vitals/Pain Pain Assessment: Faces Faces Pain Scale: Hurts little more Pain Location: R shoulder Pain Descriptors / Indicators: Aching;Sore Pain Intervention(s): Limited activity within patient's tolerance;Monitored during session    [place] expects to be discharged to:: Private residence Living Arrangements: Spouse/significant other Available Help at Discharge: Family Type of Home: House Home Access: Level entry     Home Layout: One level Home Equipment: None      Prior Function Level of Independence: Independent         Comments: Pt states she  was independent with all ADLS      Hand Dominance        Extremity/Trunk Assessment   Upper Extremity Assessment Upper Extremity Assessment: RUE deficits/detail RUE Deficits / Details: Pt has limited AROM with R shoulder flexion and slight pain increase with open can/empty can test but no significant weakness, upon palpation pt reports sensitivity that reproduces pain around insertion of infraspinatus    Lower Extremity Assessment Lower Extremity Assessment: Overall WFL for tasks assessed     Cervical / Trunk Assessment Cervical / Trunk Assessment:  (Pt has forward flexed trunk in sitting )  Communication   Communication: No difficulties  Cognition Arousal/Alertness: Awake/alert Behavior During Therapy: WFL for tasks assessed/performed Overall Cognitive Status: Within Functional Limits for tasks assessed                                        General Comments      Exercises     Assessment/Plan    PT Assessment All further PT needs can be met in the next venue of care  PT Problem List Decreased strength;Decreased range of motion;Pain;Decreased activity tolerance;Decreased mobility       PT Treatment Interventions      PT Goals (Current goals can be found in the Care Plan section)  Acute Rehab PT Goals Patient Stated Goal: Pt would like to return home and reduce pain in R shoulder  PT Goal Formulation: All assessment and education complete, DC therapy    Frequency     Barriers to discharge        Co-evaluation               AM-PAC PT "6 Clicks" Daily Activity  Outcome Measure Difficulty turning over in bed (including adjusting bedclothes, sheets and blankets)?: None Difficulty moving from lying on back to sitting on the side of the bed? : None Difficulty sitting down on and standing up from a chair with arms (e.g., wheelchair, bedside commode, etc,.)?: None Help needed moving to and from a bed to chair (including a wheelchair)?: A Little Help needed walking in hospital room?: None Help needed climbing 3-5 steps with a railing? : A Little 6 Click Score: 22    End of Session Equipment Utilized During Treatment: Gait belt Activity Tolerance: Patient tolerated treatment well Patient left: in chair;with call bell/phone within reach;with family/visitor present Nurse Communication: Mobility status PT Visit Diagnosis: Muscle weakness (generalized) (M62.81)    Time: 0940-1001 PT Time Calculation (min) (ACUTE ONLY): 21  min   Charges:   PT Evaluation $PT Eval Low Complexity: 1 Low     PT G CodesOlegario Shearer, SPT  Reino Bellis 03/29/2017, 12:21 PM

## 2017-03-29 NOTE — Discharge Summary (Signed)
Physician Discharge Summary  Wendy Figueroa NIO:[id number] DOB: 08/30/1945 DOA: 03/26/2017  PCP: [name], DO  Admit date: 03/26/2017 Discharge date: 03/29/2017  Admitted From: Home Disposition: Home  Recommendations for Outpatient Follow-up:  1. Follow up with PCP in 1-2 weeks 2. Discharge mental Xarelto starter pack. She is advised to take 50 mg orally twice daily for 21 days and thereafter 20 mg daily. 3. Patient also educated on side effects of xarelto including bleeding especially life-threatening. Patient understood the risks and benefits.  Home Health: None Equipment/Devices: None Discharge Condition: Stable CODE STATUS: Full Diet recommendation: Heart Healthy  Brief/Interim Summary: 71 year old female with past medical history of hypertension, asthma, seasonal allergies came to the ER with complaints of sudden onset of shortness of breath. In the ER she was noted to be hypoxic, mildly elevated BNP and normal chest x-ray. She had a CTA of the chest done which revealed severe bilateral pulmonary embolism with some right heart strain. Case was discussed with the critical care physician and she was deemed not to be a thrombolytic candidate. She was started on heparin drip and admitted for further care. Upon admission she had an echocardiogram done which showed ejection fraction 50-55 percent with severe RV dilation with reduced RV systolic function. Lower extremity Doppler showed superficial thrombosis in the right lesser saphenous vein. Certain genetic tests were sent including lupus anticoagulant, factor V Leiden and prothrombin gene mutation but they're pending at the time of discharge. After her receiving greater than 48 hours of IV heparin her oral anticoagulation was transitioned to Xarelto. Today she is doing much better and states his shortness of breath has resolved and she is able to ambulate without any difficulty or getting short of breath. She is advised to be on  anticoagulation for at least next 6 months and she needs to follow-up with primary care physician as an outpatient and at that time further need for anticoagulation will be determined. She is deemed medically stable to be discharged with outpatient follow-up and recommendations stated as above. Does not any complaints today and she is tolerating oral diet well. She does not have any supplemental oxygen requirement and is saturating 99% on room air.  Discharge Diagnoses:  Active Problems:   Overweight   Essential hypertension   Asthma, mild intermittent   Dysphonia   Pulmonary embolus (HCC)   Acute on chronic respiratory failure with hypoxia (HCC)   Acute respiratory failure with hypoxia (HCC)  Submassive acute pulmonary embolism with cor pulmonale -Likely unprovoked. IV heparin has been transitioned to oral Xarelto -Echocardiogram shows ejection fraction 55 sent without any wall motion abnormality, severe RV dilation reduced RV systolic function -Venous duplex showed superficial thrombosis of right lesser saphenous vein -Lupus anticoagulant, factor V Leiden and prothrombin gene mutations have been sent which are pending at this time -Plans to keep her on anticoagulation for at least 6 months and further need will be determined by her primary care physician at the end of that time period.  Acute respiratory failure with hypoxia which was secondary to submassive pulmonary embolism has resolved  History of hypertension -Continue losartan and nifedipine  History of chronic asthma -Continue home medicines  Dysphonia and laryngeal reflex -Continue home meds  Hyperlipidemia -On statin.  Discharge Instructions   Allergies as of 03/29/2017      Reactions   Demerol Hives   All over the body    Lisinopril-hydrochlorothiazide Hives   All over the body   Penicillins Hives   Has patient  had a PCN reaction causing immediate rash, facial/tongue/throat swelling, SOB or lightheadedness with  hypotension: No Has patient had a PCN reaction causing severe rash involving mucus membranes or skin necrosis: No Has patient had a PCN reaction that required hospitalization: No Has patient had a PCN reaction occurring within the last 10 years: No If all of the above answers are "NO", then may proceed with Cephalosporin use. All over the body      Medication List    TAKE these medications   albuterol 108 (90 Base) MCG/ACT inhaler Commonly known as:  PROVENTIL HFA;VENTOLIN HFA Inhale 2 puffs into the lungs every 6 (six) hours as needed for wheezing or shortness of breath.   budesonide-formoterol 160-4.5 MCG/ACT inhaler Commonly known as:  SYMBICORT Inhale 2 puffs into the lungs 2 (two) times daily.   CALCIUM 600 + D PO Take by mouth. Take one tablet daily   cetirizine 10 MG tablet Commonly known as:  ZYRTEC Take 10 mg by mouth daily.   chlorpheniramine-HYDROcodone 10-8 MG/5ML Suer Commonly known as:  TUSSIONEX PENNKINETIC ER Take 5 mLs by mouth every 12 (twelve) hours as needed for cough.   FISH OIL PO Take by mouth daily.   fluticasone 50 MCG/ACT nasal spray Commonly known as:  FLONASE 2 sprays daily.   furosemide 40 MG tablet Commonly known as:  LASIX TAKE 1 TABLET (40 MG TOTAL) BY MOUTH DAILY.   gabapentin 100 MG capsule Commonly known as:  NEURONTIN Take 100 mg by mouth every other day.   KLOR-CON M20 20 MEQ tablet Generic drug:  potassium chloride SA TAKE 2 TABLETS EVERY DAY FOR POTASSIUM SUPPLEMENT   levocetirizine 5 MG tablet Commonly known as:  XYZAL Take 5 mg by mouth every evening.   losartan 25 MG tablet Commonly known as:  COZAAR Take 25 mg by mouth daily. Take with 50 mg tablet for a total of 75 mg daily.   losartan 50 MG tablet Commonly known as:  COZAAR TAKE 1 TABLET EVERY DAY FOR BLOOD PRESSURE   MACULAR VITAMIN BENEFIT PO Take 1 capsule by mouth 2 (two) times daily.   montelukast 10 MG tablet Commonly known as:  SINGULAIR Take 10 mg  by mouth at bedtime.   NIFEdipine 60 MG 24 hr tablet Commonly known as:  PROCARDIA XL/ADALAT-CC TAKE 1 TABLET EVERY DAY   NON FORMULARY Allergy shots once every other week   pantoprazole 40 MG tablet Commonly known as:  PROTONIX Take one tablet by mouth twice daily for stomach   pravastatin 40 MG tablet Commonly known as:  PRAVACHOL TAKE 1 TABLET BY MOUTH EVERY DAY   psyllium 58.6 % packet Commonly known as:  METAMUCIL Take 1 packet by mouth daily as needed (constipation).   ranitidine 300 MG tablet Commonly known as:  ZANTAC Take 300 mg by mouth at bedtime.   Rivaroxaban 15 & 20 MG Tbpk Take as directed on package: Start with one 59m tablet by mouth twice a day with food. On Day 22, switch to one 29mtablet once a day with food.   rivaroxaban Kit Commonly known as:  XARELTO 1 kit by Does not apply route once.   SPIRIVA HANDIHALER 18 MCG inhalation capsule Generic drug:  tiotropium 18 mcg 2 (two) times daily.   vitamin B-12 1000 MCG tablet Commonly known as:  CYANOCOBALAMIN Take 1,000 mcg by mouth daily.       Allergies  Allergen Reactions  . Demerol Hives    All over the body   .  Lisinopril-Hydrochlorothiazide Hives    All over the body  . Penicillins Hives    Has patient had a PCN reaction causing immediate rash, facial/tongue/throat swelling, SOB or lightheadedness with hypotension: No Has patient had a PCN reaction causing severe rash involving mucus membranes or skin necrosis: No Has patient had a PCN reaction that required hospitalization: No Has patient had a PCN reaction occurring within the last 10 years: No If all of the above answers are "NO", then may proceed with Cephalosporin use.   All over the body    On your next visit with your primary care physician please Get Medicines reviewed and adjusted.   Please request your Prim.MD to go over all Hospital Tests and Procedure/Radiological results at the follow up, please get all Hospital  records sent to your Prim MD by signing hospital release before you go home.   If you experience worsening of your admission symptoms, develop shortness of breath, life threatening emergency, suicidal or homicidal thoughts you must seek medical attention immediately by calling 911 or calling your MD immediately  if symptoms less severe.  You Must read complete instructions/literature along with all the possible adverse reactions/side effects for all the Medicines you take and that have been prescribed to you. Take any new Medicines after you have completely understood and accpet all the possible adverse reactions/side effects.   Do not drive, operate heavy machinery, perform activities at heights, swimming or participation in water activities or provide baby sitting services if your were admitted for syncope or siezures until you have seen by Primary MD or a Neurologist and advised to do so again.  Do not drive when taking Pain medications.    Do not take more than prescribed Pain, Sleep and Anxiety Medications  Special Instructions: If you have smoked or chewed Tobacco  in the last 2 yrs please stop smoking, stop any regular Alcohol  and or any Recreational drug use.  Wear Seat belts while driving.   Please note  You were cared for by a hospitalist during your hospital stay. If you have any questions about your discharge medications or the care you received while you were in the hospital after you are discharged, you can call the unit and asked to speak with the hospitalist on call if the hospitalist that took care of you is not available. Once you are discharged, your primary care physician will handle any further medical issues. Please note that NO REFILLS for any discharge medications will be authorized once you are discharged, as it is imperative that you return to your primary care physician (or establish a relationship with a primary care physician if you do not have one) for  your aftercare needs so that they can reassess your need for medications and monitor your lab values.   Increase activity slowly        Consultations:  None   Procedures/Studies: Dg Chest 2 View  Result Date: 03/26/2017 CLINICAL DATA:  Shortness of breath.  Asthma. EXAM: CHEST  2 VIEW COMPARISON:  01/08/2014 FINDINGS: The heart size and mediastinal contours are within normal limits. Stable mild pleural thickening in lower left hemithorax. No evidence of pulmonary infiltrate or edema. No evidence of pleural effusion. IMPRESSION: No active cardiopulmonary disease. Electronically Signed   By: Earle Gell M.D.   On: 03/26/2017 12:32   Ct Angio Chest Pe W And/or Wo Contrast  Result Date: 03/26/2017 CLINICAL DATA:  Asthma, short of breath. EXAM: CT ANGIOGRAPHY CHEST WITH CONTRAST TECHNIQUE: Multidetector CT  imaging of the chest was performed using the standard protocol during bolus administration of intravenous contrast. Multiplanar CT image reconstructions and MIPs were obtained to evaluate the vascular anatomy. CONTRAST:  100 mL Isovue COMPARISON:  None. FINDINGS: Cardiovascular: Bilateral tubular filling defects within the proximal RIGHT lower lobe pulmonary artery, LEFT lobe pulmonary artery, RIGHT upper lobe pulmonary arteries, and LEFT upper lobe pulmonary arteries. The clot burden is severe and partially occlusive to occlusive. There is evidence of RIGHT ventricular strain with the RIGHT ventricular to LEFT ventricular ratio > 1 (1.8) Mediastinum/Nodes: No axillary or supraclavicular adenopathy. No mediastinal hilar adenopathy. No pericardial fluid Lungs/Pleura: Bilateral small pleural effusions. Small focus of consolidation in the RIGHT lower lobe measuring 20 mm could represent small infarction. No pneumothorax Upper Abdomen: Limited view of the liver, kidneys, pancreas are unremarkable. Normal adrenal glands. Musculoskeletal: No aggressive osseous lesion Review of the MIP images confirms the  above findings. IMPRESSION: 1. Severe bilateral pulmonary emboli involving the proximal pulmonary arteries of all of the LEFT and RIGHT lung lobes. 2. CT evidence of right heart strain (RV/LV Ratio = 1.8) consistent with at least submassive (intermediate risk) PE. The presence of right heart strain has been associated with an increased risk of morbidity and mortality. Please activate Code PE by paging [phone]. 3. Mild basilar effusion. Potential small infarction at the RIGHT lung base. Critical Value/emergent results were called by telephone at the time of interpretation on 03/26/2017 at 1:13 pm to Dr. [name] , who verbally acknowledged these results. Electronically Signed   By: Suzy Bouchard M.D.   On: 03/26/2017 13:14       Subjective: Patient is on any complaints. Tolerating oral diet and wishes to be discharged.  Discharge Exam: Vitals:   03/29/17 0529 03/29/17 1119  BP: (!) 145/81   Pulse: 99 94  Resp: 20 18  Temp: 98.2 F (36.8 C)    Vitals:   03/28/17 2118 03/29/17 0529 03/29/17 0851 03/29/17 1119  BP: (!) 145/88 (!) 145/81    Pulse: (!) 107 99  94  Resp: _0 Temp: 98 F (36.7 C) 98.2 F (36.8 C)    TempSrc: Oral Oral    SpO2: 98% 95% 98% 99%  Weight:      Height:        General: Pt is alert, awake, not in acute distress Cardiovascular: RRR, S1/S2 +, no rubs, no gallops Respiratory: CTA bilaterally, no wheezing, no rhonchi Abdominal: Soft, NT, ND, bowel sounds + Extremities: no edema, no cyanosis    The results of significant diagnostics from this hospitalization (including imaging, microbiology, ancillary and laboratory) are listed below for reference.     Microbiology: Recent Results (from the past 240 hour(s))  MRSA PCR Screening     Status: None   Collection Time: 03/26/17  4:50 PM  Result Value Ref Range Status   MRSA by PCR NEGATIVE NEGATIVE Final    Comment:        The GeneXpert MRSA Assay (FDA approved for NASAL specimens only), is  one component of a comprehensive MRSA colonization surveillance program. It is not intended to diagnose MRSA infection nor to guide or monitor treatment for MRSA infections.      Labs: BNP (last 3 results)  Recent Labs  03/26/17 1136  BNP 585.9*   Basic Metabolic Panel:  Recent Labs Lab 03/26/17 1136 03/27/17 0339 03/28/17 0339  NA 141 139 142  K 4.1 4.0 3.8  CL 110 110 115*  CO2  22 20* 21*  GLUCOSE 140* 123* 105*  BUN _0 CREATININE 0.96 0.85 0.72  CALCIUM 9.4 8.6* 8.8*   Liver Function Tests: No results for input(s): AST, ALT, ALKPHOS, BILITOT, PROT, ALBUMIN in the last 168 hours. No results for input(s): LIPASE, AMYLASE in the last 168 hours. No results for input(s): AMMONIA in the last 168 hours. CBC:  Recent Labs Lab 03/26/17 1136 03/27/17 0339 03/28/17 0339 03/29/17 0516  WBC 10.7* 9.1 7.9 6.7  HGB 12.0 10.5* 10.7* 10.9*  HCT 35.5* 32.3* 31.9* 32.9*  MCV 79.2 79.0 79.8 77.8*  PLT 191 169 185 210   Cardiac Enzymes: No results for input(s): CKTOTAL, CKMB, CKMBINDEX, TROPONINI in the last 168 hours. BNP: Invalid input(s): POCBNP CBG: No results for input(s): GLUCAP in the last 168 hours. D-Dimer No results for input(s): DDIMER in the last 72 hours. Hgb A1c No results for input(s): HGBA1C in the last 72 hours. Lipid Profile No results for input(s): CHOL, HDL, LDLCALC, TRIG, CHOLHDL, LDLDIRECT in the last 72 hours. Thyroid function studies No results for input(s): TSH, T4TOTAL, T3FREE, THYROIDAB in the last 72 hours.  Invalid input(s): FREET3 Anemia work up No results for input(s): VITAMINB12, FOLATE, FERRITIN, TIBC, IRON, RETICCTPCT in the last 72 hours. Urinalysis    Component Value Date/Time   COLORURINE YELLOW 09/09/2016 1126   APPEARANCEUR CLEAR 09/09/2016 1126   APPEARANCEUR Cloudy (A) 05/27/2015 0844   LABSPEC 1.012 09/09/2016 1126   PHURINE 7.5 09/09/2016 1126   GLUCOSEU NEGATIVE 09/09/2016 1126   [place] 09/09/2016  1126   [place] 09/09/2016 1126   BILIRUBINUR Negative 05/27/2015 [place] 09/09/2016 1126   PROTEINUR NEGATIVE 09/09/2016 1126   NITRITE NEGATIVE 09/09/2016 1126   LEUKOCYTESUR NEGATIVE 09/09/2016 1126   LEUKOCYTESUR 3+ (A) 05/27/2015 0844   Sepsis Labs Invalid input(s): PROCALCITONIN,  WBC,  LACTICIDVEN Microbiology Recent Results (from the past 240 hour(s))  MRSA PCR Screening     Status: None   Collection Time: 03/26/17  4:50 PM  Result Value Ref Range Status   MRSA by PCR NEGATIVE NEGATIVE Final    Comment:        The GeneXpert MRSA Assay (FDA approved for NASAL specimens only), is one component of a comprehensive MRSA colonization surveillance program. It is not intended to diagnose MRSA infection nor to guide or monitor treatment for MRSA infections.      Time coordinating discharge: Over 30 minutes  SIGNED:   Damita Lack, MD  [facility] 03/29/2017, 11:35 AM Pager   If 7PM-7AM, please contact night-coverage [email or web address] Password [email or web address]

## 2017-03-30 LAB — LUPUS ANTICOAGULANT PANEL
DRVVT: 42.4 s (ref 0.0–47.0)
PTT LA: 35.3 s (ref 0.0–51.9)

## 2017-03-31 ENCOUNTER — Telehealth: Payer: Self-pay

## 2017-03-31 NOTE — Telephone Encounter (Signed)
Transition Care Management Follow-Up Telephone Call   Date discharged and where:  How have you been since you were released from the hospital? Recovering and increase activity daily. Slight cough still present. Denies SOB, n/v/d or fever.  Any patient concerns? None  Items Reviewed:   Meds: verified  Allergies: verified  Dietary Changes Reviewed: Heart healthy  Functional Questionnaire:  Independent-I Dependent-D  ADLs:   Dressing- I    Eating-I   Maintaining continence-I   Transferring-I   Transportation- I   Meal Prep-I   Managing Meds- I  Confirmed importance and Date/Time of follow-up visits scheduled: None. Pt to call and set up apt within the next 2 weeks for follow up with PCP.    Confirmed with patient if condition worsens to call PCP or go to the Emergency Dept. Patient was given office number and encouraged to call back with questions or concerns: YES

## 2017-04-03 LAB — PROTHROMBIN GENE MUTATION

## 2017-04-03 LAB — FACTOR 5 LEIDEN

## 2017-04-07 DIAGNOSIS — J3089 Other allergic rhinitis: Secondary | ICD-10-CM | POA: Diagnosis not present

## 2017-04-07 DIAGNOSIS — J3081 Allergic rhinitis due to animal (cat) (dog) hair and dander: Secondary | ICD-10-CM | POA: Diagnosis not present

## 2017-04-07 DIAGNOSIS — J301 Allergic rhinitis due to pollen: Secondary | ICD-10-CM | POA: Diagnosis not present

## 2017-04-11 ENCOUNTER — Ambulatory Visit (INDEPENDENT_AMBULATORY_CARE_PROVIDER_SITE_OTHER): Payer: PPO | Admitting: Internal Medicine

## 2017-04-11 ENCOUNTER — Encounter: Payer: Self-pay | Admitting: Internal Medicine

## 2017-04-11 VITALS — BP 136/78 | HR 74 | Temp 98.2°F | Resp 20 | Ht 63.0 in | Wt 221.8 lb

## 2017-04-11 DIAGNOSIS — I2609 Other pulmonary embolism with acute cor pulmonale: Secondary | ICD-10-CM

## 2017-04-11 DIAGNOSIS — J3089 Other allergic rhinitis: Secondary | ICD-10-CM | POA: Diagnosis not present

## 2017-04-11 DIAGNOSIS — J3081 Allergic rhinitis due to animal (cat) (dog) hair and dander: Secondary | ICD-10-CM | POA: Diagnosis not present

## 2017-04-11 DIAGNOSIS — I872 Venous insufficiency (chronic) (peripheral): Secondary | ICD-10-CM | POA: Diagnosis not present

## 2017-04-11 DIAGNOSIS — J4520 Mild intermittent asthma, uncomplicated: Secondary | ICD-10-CM

## 2017-04-11 DIAGNOSIS — J301 Allergic rhinitis due to pollen: Secondary | ICD-10-CM | POA: Diagnosis not present

## 2017-04-11 NOTE — Progress Notes (Signed)
Patient ID: Wendy Figueroa, female   DOB: March 29, 1946, 71 y.o.   MRN: [id number]    Location:  [facility] Place of Service: OFFICE  Chief Complaint  Patient presents with  . Hospitalization Follow-up    03/26/17-03/29/17 for PE    HPI:  71 yo female seen today following hospital stay for submassive acute PE with cor pulmonale, acute/chronic respiratory failure with hypoxia, dysphonia, HTN. She presented to the ED with sudden onset of SOB. CTA chest showed severe b/l PE with right heart strain. She was not a thrombolytic candidate. She was started on heparin gtt-->po xeralto after 48 hrs. 2D echo revealed EF 50-55% with severe RV dilation and reduced RV systolic fxn. Venous doppler US of LE revealed superficial thrombosis in right lesser saphenous vein. Hypercoagulable w/u neg. Hgb 10.9; Cr 0.72. She was d/c'd on xeralto starter pack. TOC call completed 03/31/17  Today she reports improved SOB. No CP, easy bruising or bloody stools. Fatigue improved. She needs Rx assistance form completed so that she can get xeralto as it is very expensive. No leg pain. She has chronic LE swelling. She wears TED stockings daily. She has resumed nml activity and rests frequently. No f/c.   Past Medical History:  Diagnosis Date  . Abnormal Pap smear    Over 74yr ago  . ALKALINE PHOSPHATASE, ELEVATED 07/01/2009   Qualifier: Diagnosis of  By: NLenna GilfordMD, SDeborra Medina  . Allergic rhinitis   . Anemia   . Arthritis   . Asthma   . Cough    occasional  . Cystocele 11/05/08  . Diverticulosis of colon   . DJD (degenerative joint disease)    no per pt  . Elevated alkaline phosphatase level   . GERD (gastroesophageal reflux disease)   . H/O urinary incontinence 11/06/2008  . H/O varicella   . Hard measles   . Hx of colonic polyps   . Hypercholesterolemia   . Hypertension   . Leg swelling    occasional  . 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 Overweight(278.02)   . Rectocele 11/06/2008   Large  .  Sebaceous cyst    on back  . Sinusitis   . Venous insufficiency   . Wheezing    occasional  . Yeast infection     Past Surgical History:  Procedure Laterality Date  . ABDOMINAL HYSTERECTOMY  1988  . ANTERIOR AND POSTERIOR REPAIR  11/05/08  . APOGEE / PERIGEE REPAIR  10/2008   Dr. AOctavio Manns . bladder tack  2011  . BREAST SURGERY  1978   cyst removed from both removed  . COLONOSCOPY    . ESOPHAGOGASTRODUODENOSCOPY      Patient Care Team: CGildardo Cranker DO as PCP - General (Internal Medicine) HMonna Fam MD as Consulting Physician (Ophthalmology)  Social History   Social History  . Marital status: Married    Spouse name: SEbony Hail . Number of children: 2  . Years of education: N/A   Occupational History  . LPN     Social History Main Topics  . Smoking status: Never Smoker  . Smokeless tobacco: Never Used  . Alcohol use 1.2 oz/week    2 Cans of beer per week     Comment: 2 beers on the weekend  . Drug use: No  . Sexual activity: Not Currently    Partners: Male    Birth control/ protection: None   Other Topics Concern  . Not on file   Social History Narrative  Married   Never smoked   Alcohol few beers on week-end   Exercise walk 5 days a week for one hour   No Advance Directive      reports that she has never smoked. She has never used smokeless tobacco. She reports that she drinks about 1.2 oz of alcohol per week . She reports that she does not use drugs.  Family History  Problem Relation Age of Onset  . Aneurysm Mother   . Lung cancer Father   . Hypertension Daughter   . Hypertension Daughter   . Colon cancer Neg Hx   . Esophageal cancer Neg Hx   . Stomach cancer Neg Hx   . Rectal cancer Neg Hx   . Colon polyps Neg Hx    Family Status  Relation Status  . Mother Deceased at age 57       stroke  . Father Deceased at age 18       cancer  . Daughter Alive  . Daughter Alive  . Neg Hx (Not Specified)     Allergies  Allergen Reactions   . Demerol Hives    All over the body   . Lisinopril-Hydrochlorothiazide Hives    All over the body  . Penicillins Hives    Has patient had a PCN reaction causing immediate rash, facial/tongue/throat swelling, SOB or lightheadedness with hypotension: No Has patient had a PCN reaction causing severe rash involving mucus membranes or skin necrosis: No Has patient had a PCN reaction that required hospitalization: No Has patient had a PCN reaction occurring within the last 10 years: No If all of the above answers are "NO", then may proceed with Cephalosporin use.   All over the body    Medications: Patient's Medications  New Prescriptions   No medications on file  Previous Medications   ALBUTEROL (PROVENTIL HFA;VENTOLIN HFA) 108 (90 BASE) MCG/ACT INHALER    Inhale 2 puffs into the lungs every 6 (six) hours as needed for wheezing or shortness of breath.   BUDESONIDE-FORMOTEROL (SYMBICORT) 160-4.5 MCG/ACT INHALER    Inhale 2 puffs into the lungs 2 (two) times daily.     CALCIUM CARB-CHOLECALCIFEROL (CALCIUM 600 + D PO)    Take by mouth. Take one tablet daily   CETIRIZINE (ZYRTEC) 10 MG TABLET    Take 10 mg by mouth daily.   CHLORPHENIRAMINE-HYDROCODONE (TUSSIONEX PENNKINETIC ER) 10-8 MG/5ML SUER    Take 5 mLs by mouth every 12 (twelve) hours as needed for cough.   FLUTICASONE (FLONASE) 50 MCG/ACT NASAL SPRAY    2 sprays daily.   FUROSEMIDE (LASIX) 40 MG TABLET    TAKE 1 TABLET (40 MG TOTAL) BY MOUTH DAILY.   KLOR-CON M20 20 MEQ TABLET    TAKE 2 TABLETS EVERY DAY FOR POTASSIUM SUPPLEMENT   LEVOCETIRIZINE (XYZAL) 5 MG TABLET    Take 5 mg by mouth every evening.     LOSARTAN (COZAAR) 25 MG TABLET    Take 25 mg by mouth daily. Take with 50 mg tablet for a total of 75 mg daily.   LOSARTAN (COZAAR) 50 MG TABLET    TAKE 1 TABLET EVERY DAY FOR BLOOD PRESSURE   MONTELUKAST (SINGULAIR) 10 MG TABLET    Take 10 mg by mouth at bedtime.     MULTIPLE VITAMINS-MINERALS (MACULAR VITAMIN BENEFIT PO)    Take  1 capsule by mouth 2 (two) times daily.     NIFEDIPINE (PROCARDIA XL/ADALAT-CC) 60 MG 24 HR TABLET    TAKE 1  TABLET EVERY DAY   NON FORMULARY    Allergy shots once every other week   OMEGA-3 FATTY ACIDS (FISH OIL PO)    Take by mouth daily.   PANTOPRAZOLE (PROTONIX) 40 MG TABLET    Take one tablet by mouth twice daily for stomach   PRAVASTATIN (PRAVACHOL) 40 MG TABLET    TAKE 1 TABLET BY MOUTH EVERY DAY   PSYLLIUM (METAMUCIL) 58.6 % PACKET    Take 1 packet by mouth daily as needed (constipation).   RANITIDINE (ZANTAC) 300 MG TABLET    Take 300 mg by mouth at bedtime.   RIVAROXABAN (XARELTO STARTER PACK) 15 & 20 MG TBPK    Take 15 mg by mouth 2 (two) times daily. Take as directed on package: Start with one '15mg'$  tablet by mouth twice a day with food. On Day 22, switch to one '20mg'$  tablet once a day with food.   RIVAROXABAN 15 & 20 MG TBPK    Take as directed on package: Start with one '15mg'$  tablet by mouth twice a day with food. On Day 22, switch to one '20mg'$  tablet once a day with food.   SPIRIVA HANDIHALER 18 MCG INHALATION CAPSULE    18 mcg 2 (two) times daily.   VITAMIN B-12 (CYANOCOBALAMIN) 1000 MCG TABLET    Take 1,000 mcg by mouth daily.  Modified Medications   No medications on file  Discontinued Medications   GABAPENTIN (NEURONTIN) 100 MG CAPSULE    Take 100 mg by mouth every other day.     Review of Systems  Constitutional: Positive for fatigue.  Respiratory: Positive for shortness of breath.   Cardiovascular: Positive for leg swelling. Negative for chest pain.  All other systems reviewed and are negative.   Vitals:   04/11/17 0829  BP: 136/78  Pulse: 74  Resp: 20  Temp: 98.2 F (36.8 C)  SpO2: 97%  Weight: 221 lb 12.8 oz (100.6 kg)  Height: '5\' 3"'$  (1.6 m)   Body mass index is 39.29 kg/m.  Physical Exam  Constitutional: She is oriented to person, place, and time. She appears well-developed and well-nourished.  No conversational dyspnea  HENT:  Mouth/Throat: Oropharynx  is clear and moist. No oropharyngeal exudate.  MMM; no oral thrush  Eyes: Pupils are equal, round, and reactive to light. No scleral icterus.  Neck: Neck supple. Carotid bruit is not present. No tracheal deviation present.  Cardiovascular: Normal rate, regular rhythm, normal heart sounds and intact distal pulses.  Exam reveals no gallop and no friction rub.   No murmur heard. +1 pitting LE edema b/l. No calf TTP. No palpable cords  Pulmonary/Chest: Effort normal and breath sounds normal. No stridor. No respiratory distress. She has no wheezes. She has no rales.  Abdominal: Soft. Normal appearance and bowel sounds are normal. She exhibits no distension and no mass. There is no hepatomegaly. There is no tenderness. There is no rigidity, no rebound and no guarding. No hernia.  Musculoskeletal: She exhibits edema.  Lymphadenopathy:    She has no cervical adenopathy.  Neurological: She is alert and oriented to person, place, and time.  Skin: Skin is warm and dry. No rash noted.  Psychiatric: She has a normal mood and affect. Her behavior is normal. Judgment and thought content normal.     Labs reviewed: Admission on 03/26/2017, Discharged on 03/29/2017  Component Date Value Ref Range Status  . Sodium 03/26/2017 141  135 - 145 mmol/L Final  . Potassium 03/26/2017 4.1  3.5 -  5.1 mmol/L Final  . Chloride 03/26/2017 110  101 - 111 mmol/L Final  . CO2 03/26/2017 22  22 - 32 mmol/L Final  . Glucose, Bld 03/26/2017 140* 65 - 99 mg/dL Final  . BUN 51/25/3486 13  6 - 20 mg/dL Final  . Creatinine, Ser 03/26/2017 0.96  0.44 - 1.00 mg/dL Final  . Calcium 33/30/1862 9.4  8.9 - 10.3 mg/dL Final  . GFR calc non Af Amer 03/26/2017 57* >60 mL/min Final  . GFR calc Af Amer 03/26/2017 >60  >60 mL/min Final   Comment: (NOTE) The eGFR has been calculated using the CKD EPI equation. This calculation has not been validated in all clinical situations. eGFR's persistently <60 mL/min signify possible Chronic  Kidney Disease.   . Anion gap 03/26/2017 9  5 - 15 Final  . WBC 03/26/2017 10.7* 4.0 - 10.5 K/uL Final  . RBC 03/26/2017 4.48  3.87 - 5.11 MIL/uL Final  . Hemoglobin 03/26/2017 12.0  12.0 - 15.0 g/dL Final  . HCT 76/59/9420 35.5* 36.0 - 46.0 % Final  . MCV 03/26/2017 79.2  78.0 - 100.0 fL Final  . MCH 03/26/2017 26.8  26.0 - 34.0 pg Final  . MCHC 03/26/2017 33.8  30.0 - 36.0 g/dL Final  . RDW 27/24/1506 14.8  11.5 - 15.5 % Final  . Platelets 03/26/2017 191  150 - 400 K/uL Final  . B Natriuretic Peptide 03/26/2017 445.4* 0.0 - 100.0 pg/mL Final  . Troponin i, poc 03/26/2017 0.03  0.00 - 0.08 ng/mL Final  . Comment 3 03/26/2017          Final   Comment: Due to the release kinetics of cTnI, a negative result within the first hours of the onset of symptoms does not rule out myocardial infarction with certainty. If myocardial infarction is still suspected, repeat the test at appropriate intervals.   . Prothrombin Time 03/26/2017 13.9  11.4 - 15.2 seconds Final  . INR 03/26/2017 1.07   Final  . aPTT 03/26/2017 28  24 - 36 seconds Final  . Heparin Unfractionated 03/26/2017 0.46  0.30 - 0.70 IU/mL Final   Comment:        IF HEPARIN RESULTS ARE BELOW EXPECTED VALUES, AND PATIENT DOSAGE HAS BEEN CONFIRMED, SUGGEST FOLLOW UP TESTING OF ANTITHROMBIN III LEVELS.   . Weight 03/27/2017 3648  oz Final-Edited  . Height 03/27/2017 63  in Final-Edited  . BP 03/27/2017 150/95  mmHg Final-Edited  . Sodium 03/27/2017 139  135 - 145 mmol/L Final  . Potassium 03/27/2017 4.0  3.5 - 5.1 mmol/L Final  . Chloride 03/27/2017 110  101 - 111 mmol/L Final  . CO2 03/27/2017 20* 22 - 32 mmol/L Final  . Glucose, Bld 03/27/2017 123* 65 - 99 mg/dL Final  . BUN 62/86/3957 13  6 - 20 mg/dL Final  . Creatinine, Ser 03/27/2017 0.85  0.44 - 1.00 mg/dL Final  . Calcium 30/50/6769 8.6* 8.9 - 10.3 mg/dL Final  . GFR calc non Af Amer 03/27/2017 >60  >60 mL/min Final  . GFR calc Af Amer 03/27/2017 >60  >60 mL/min  Final   Comment: (NOTE) The eGFR has been calculated using the CKD EPI equation. This calculation has not been validated in all clinical situations. eGFR's persistently <60 mL/min signify possible Chronic Kidney Disease.   . Anion gap 03/27/2017 9  5 - 15 Final  . Prothrombin Time 03/27/2017 15.0  11.4 - 15.2 seconds Final  . INR 03/27/2017 1.17   Final  . MRSA  by PCR 03/26/2017 NEGATIVE  NEGATIVE Final   Comment:        The GeneXpert MRSA Assay (FDA approved for NASAL specimens only), is one component of a comprehensive MRSA colonization surveillance program. It is not intended to diagnose MRSA infection nor to guide or monitor treatment for MRSA infections.   . WBC 03/27/2017 9.1  4.0 - 10.5 K/uL Final  . RBC 03/27/2017 4.09  3.87 - 5.11 MIL/uL Final  . Hemoglobin 03/27/2017 10.5* 12.0 - 15.0 g/dL Final  . HCT 03/27/2017 32.3* 36.0 - 46.0 % Final  . MCV 03/27/2017 79.0  78.0 - 100.0 fL Final  . MCH 03/27/2017 25.7* 26.0 - 34.0 pg Final  . MCHC 03/27/2017 32.5  30.0 - 36.0 g/dL Final  . RDW 03/27/2017 15.1  11.5 - 15.5 % Final  . Platelets 03/27/2017 169  150 - 400 K/uL Final  . Heparin Unfractionated 03/27/2017 0.49  0.30 - 0.70 IU/mL Final   Comment:        IF HEPARIN RESULTS ARE BELOW EXPECTED VALUES, AND PATIENT DOSAGE HAS BEEN CONFIRMED, SUGGEST FOLLOW UP TESTING OF ANTITHROMBIN III LEVELS.   Marland Kitchen Heparin Unfractionated 03/28/2017 0.28* 0.30 - 0.70 IU/mL Final   Comment:        IF HEPARIN RESULTS ARE BELOW EXPECTED VALUES, AND PATIENT DOSAGE HAS BEEN CONFIRMED, SUGGEST FOLLOW UP TESTING OF ANTITHROMBIN III LEVELS.   . WBC 03/28/2017 7.9  4.0 - 10.5 K/uL Final  . RBC 03/28/2017 4.00  3.87 - 5.11 MIL/uL Final  . Hemoglobin 03/28/2017 10.7* 12.0 - 15.0 g/dL Final  . HCT 03/28/2017 31.9* 36.0 - 46.0 % Final  . MCV 03/28/2017 79.8  78.0 - 100.0 fL Final  . MCH 03/28/2017 26.8  26.0 - 34.0 pg Final  . MCHC 03/28/2017 33.5  30.0 - 36.0 g/dL Final  . RDW 03/28/2017  15.0  11.5 - 15.5 % Final  . Platelets 03/28/2017 185  150 - 400 K/uL Final  . Sodium 03/28/2017 142  135 - 145 mmol/L Final  . Potassium 03/28/2017 3.8  3.5 - 5.1 mmol/L Final  . Chloride 03/28/2017 115* 101 - 111 mmol/L Final  . CO2 03/28/2017 21* 22 - 32 mmol/L Final  . Glucose, Bld 03/28/2017 105* 65 - 99 mg/dL Final  . BUN 03/28/2017 13  6 - 20 mg/dL Final  . Creatinine, Ser 03/28/2017 0.72  0.44 - 1.00 mg/dL Final  . Calcium 03/28/2017 8.8* 8.9 - 10.3 mg/dL Final  . GFR calc non Af Amer 03/28/2017 >60  >60 mL/min Final  . GFR calc Af Amer 03/28/2017 >60  >60 mL/min Final   Comment: (NOTE) The eGFR has been calculated using the CKD EPI equation. This calculation has not been validated in all clinical situations. eGFR's persistently <60 mL/min signify possible Chronic Kidney Disease.   . Anion gap 03/28/2017 6  5 - 15 Final  . Recommendations-F5LEID: 03/28/2017 Comment   Final   Comment: (NOTE) Result:  Negative (no mutation found) Factor V Leiden is a specific mutation (R506Q) in the factor V gene that is associated with an increased risk of venous thrombosis. Factor V Leiden is more resistant to inactivation by activated protein C.  As a result, factor V persists in the circulation leading to a mild hyper- coagulable state.  The Leiden mutation accounts for 90% - 95% of APC resistance.  Factor V Leiden has been reported in patients with deep vein thrombosis, pulmonary embolus, central retinal vein occlusion, cerebral sinus thrombosis and hepatic vein thrombosis.  Other risk factors to be considered in the workup for venous thrombosis include the G20210A mutation in the factor II (prothrombin) gene, protein S and C deficiency, and antithrombin deficiencies. Anticardiolipin antibody and lupus anticoagulant analysis may be appropriate for certain patients, as well as homocysteine levels. Contact your local [facility] for information on how to order additi                           onal testing if desired. **Genetic counselors are available for health care providers to**  discuss results at [phone]-GENE [phone]). Methodology: DNA analysis of the Factor V gene was performed by allele-specific PCR. The diagnostic sensitivity and specificity is >99% for both. Molecular-based testing is highly accurate, but as in any laboratory test, diagnostic errors may occur. All test results must be combined with clinical information for the most accurate interpretation. This test was developed and its performance characteristics determined by [facility]. It has not been cleared or approved by the Food and Drug Administration. References: Voelkerding K (1996).  Clin Lab Med [phone]. Allison Quarry, PhD, [facility] Ruben Reason, PhD, [facility] Annetta Maw, M.S., PhD, [facility] Alfredo Bach, PhD, [facility] Norva Riffle, PhD, [facility] Earlean Polka PhD, [facility] Performed At: [facility] [address] [place], [place] [id number] Nechama Guard MD GY:[id number]                          0   . PTT Lupus Anticoagulant 03/28/2017 35.3  0.0 - 51.9 sec Final   Comment: (NOTE) Additional testing confirms the presence of heparin in the test sample. Results obtained after heparin neutralization.   Marland Kitchen DRVVT 03/28/2017 42.4  0.0 - 47.0 sec Final  . Lupus Anticoag Interp 03/28/2017 Comment:   Corrected   Comment: (NOTE) No lupus anticoagulant was detected. Performed At: [facility] [place], [place] [id number] Lindon Romp MD OY:[id number]   . Recommendations-PTGENE: 03/28/2017 Comment   Final   Comment: (NOTE) NEGATIVE No mutation identified. Comment: A point mutation (G20210A) in the factor II (prothrombin) gene is the second most common cause of inherited thrombophilia. The incidence of this mutation in the [place] Caucasian population is about 2% and in the [place] American population it is approximately 0.5%. This mutation is rare in the [place] and  Native American population. Being heterozygous for a prothrombin mutation increases the risk for developing venous thrombosis about 2 to 3 times above the general population risk. Being homozygous for the prothrombin gene mutation increases the relative risk for venous thrombosis further, although it is not yet known how much further the risk is increased. In women heterozygous for the prothrombin gene mutation, the use of estrogen containing oral contraceptives increases the relative risk of venous thrombosis about 16 times and the risk of developing cerebral thrombosis is also significantly increased. In pregnancy the pr                          othrombin gene mutation increases risk for venous thrombosis and may increase risk for stillbirth, placental abruption, pre-eclampsia and fetal growth restriction. If the patient possesses two or more congenital or acquired thrombophilic risk factors, the risk for thrombosis may rise to more than the sum of the risk ratios for the individual mutations. This assay detects only the prothrombin G20210A mutation and does not measure genetic abnormalities elsewhere in the genome. Other thrombotic risk  factors may be pursued through systematic clinical laboratory analysis. These factors include the R506Q (Leiden) mutation in the Factor V gene, plasma homocysteine levels, as well as testing for deficiencies of antithrombin III, protein C and protein S. Genetic Counselors are available for health care providers to discuss results at [phone] [phone]). Methodology: DNA analysis of the Factor II gene was performed by PCR amplification followed by restriction analysis. The di                          agnostic sensitivity is >99% for both. All the tests must be combined with clinical information for the most accurate interpretation. Molecular-based testing is highly accurate, but as in any laboratory test, diagnostic errors may occur. This test was  developed and its performance characteristics determined by [facility]. It has not been cleared or approved by the Food and Drug Administration. Poort SR, et al. Blood. 1996; 02:7253-6644. Varga EA. Circulation. 2004; 034:V42-V95. Mervin Hack, et [place]; 19:700-703. Allison Quarry, PhD, [facility] Ruben Reason, PhD, [facility] Annetta Maw, M.S., PhD, [facility] Alfredo Bach, PhD, [facility] Norva Riffle, PhD, [facility] Earlean Polka, PhD, [facility] Performed At: [facility] [address] [place], [place] [id number] Nechama Guard MD IR:[id number]   . WBC 03/29/2017 6.7  4.0 - 10.5 K/uL Final  . RBC 03/29/2017 4.23  3.87 - 5.11 MIL/uL Final  . Hemoglobin 03/29/2017 10.9* 12.0 - 15.0 g/dL Final  . HCT 03/29/2017 32.9* 36.0 - 46.0 % Final  . MCV 03/29/2017 77.8* 78.0 - 100.0 fL Final  . MCH 03/29/2017 25.8* 26.0 - 34.0 pg Final  . MCHC 03/29/2017 33.1  30.0 - 36.0 g/dL Final  . RDW 03/29/2017 14.9  11.5 - 15.5 % Final  . Platelets 03/29/2017 210  150 - 400 K/uL Final  Appointment on 01/18/2017  Component Date Value Ref Range Status  . Sodium 01/18/2017 140  135 - 146 mmol/L Final  . Potassium 01/18/2017 4.2  3.5 - 5.3 mmol/L Final  . Chloride 01/18/2017 107  98 - 110 mmol/L Final  . CO2 01/18/2017 28  20 - 31 mmol/L Final  . Glucose, Bld 01/18/2017 96  65 - 99 mg/dL Final  . BUN 01/18/2017 11  7 - 25 mg/dL Final  . Creat 01/18/2017 0.89  0.60 - 0.93 mg/dL Final   Comment:   For patients > or = 73 years of age: The upper reference limit for Creatinine is approximately 13% higher for people identified as African-American.     . Total Bilirubin 01/18/2017 0.5  0.2 - 1.2 mg/dL Final  . Alkaline Phosphatase 01/18/2017 167* 33 - 130 U/L Final  . AST 01/18/2017 20  10 - 35 U/L Final  . ALT 01/18/2017 14  6 - 29 U/L Final  . Total Protein 01/18/2017 7.3  6.1 - 8.1 g/dL Final  . Albumin 01/18/2017 4.1  3.6 - 5.1 g/dL Final  . Calcium 01/18/2017 9.8  8.6 -  10.4 mg/dL Final  . GFR, Est African American 01/18/2017 74  >=60 mL/min Final  . GFR, Est Non African American 01/18/2017 65  >=60 mL/min Final  . Cholesterol 01/18/2017 175  <200 mg/dL Final  . Triglycerides 01/18/2017 76  <150 mg/dL Final  . HDL 01/18/2017 65  >50 mg/dL Final  . Total CHOL/HDL Ratio 01/18/2017 2.7  <5.0 Ratio Final  . VLDL 01/18/2017 15  <30 mg/dL Final  . LDL Cholesterol 01/18/2017 95  <100 mg/dL  Final    Dg Chest 2 View  Result Date: 03/26/2017 CLINICAL DATA:  Shortness of breath.  Asthma. EXAM: CHEST  2 VIEW COMPARISON:  01/08/2014 FINDINGS: The heart size and mediastinal contours are within normal limits. Stable mild pleural thickening in lower left hemithorax. No evidence of pulmonary infiltrate or edema. No evidence of pleural effusion. IMPRESSION: No active cardiopulmonary disease. Electronically Signed   By: Earle Gell M.D.   On: 03/26/2017 12:32   Ct Angio Chest Pe W And/or Wo Contrast  Result Date: 03/26/2017 CLINICAL DATA:  Asthma, short of breath. EXAM: CT ANGIOGRAPHY CHEST WITH CONTRAST TECHNIQUE: Multidetector CT imaging of the chest was performed using the standard protocol during bolus administration of intravenous contrast. Multiplanar CT image reconstructions and MIPs were obtained to evaluate the vascular anatomy. CONTRAST:  100 mL Isovue COMPARISON:  None. FINDINGS: Cardiovascular: Bilateral tubular filling defects within the proximal RIGHT lower lobe pulmonary artery, LEFT lobe pulmonary artery, RIGHT upper lobe pulmonary arteries, and LEFT upper lobe pulmonary arteries. The clot burden is severe and partially occlusive to occlusive. There is evidence of RIGHT ventricular strain with the RIGHT ventricular to LEFT ventricular ratio > 1 (1.8) Mediastinum/Nodes: No axillary or supraclavicular adenopathy. No mediastinal hilar adenopathy. No pericardial fluid Lungs/Pleura: Bilateral small pleural effusions. Small focus of consolidation in the RIGHT lower lobe  measuring 20 mm could represent small infarction. No pneumothorax Upper Abdomen: Limited view of the liver, kidneys, pancreas are unremarkable. Normal adrenal glands. Musculoskeletal: No aggressive osseous lesion Review of the MIP images confirms the above findings. IMPRESSION: 1. Severe bilateral pulmonary emboli involving the proximal pulmonary arteries of all of the LEFT and RIGHT lung lobes. 2. CT evidence of right heart strain (RV/LV Ratio = 1.8) consistent with at least submassive (intermediate risk) PE. The presence of right heart strain has been associated with an increased risk of morbidity and mortality. Please activate Code PE by paging [phone]. 3. Mild basilar effusion. Potential small infarction at the RIGHT lung base. Critical Value/emergent results were called by telephone at the time of interpretation on 03/26/2017 at 1:13 pm to Dr. [name] , who verbally acknowledged these results. Electronically Signed   By: Suzy Bouchard M.D.   On: 03/26/2017 13:14     Assessment/Plan   ICD-10-CM   1. 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 Venous (peripheral) insufficiency I87.2   3. Mild intermittent asthma, unspecified whether complicated M35.59    Continue xeralto for at least 1 yr  Rx assistance form completed  Continue other medications as ordered  Follow up as scheduled next month  Education handout on PE given   Jared Whorley S. Perlie Gold  [facility] [address] [place], [place] [address] [phone] Cell (Monday-Friday 8 AM - 5 PM) [phone] After 5 PM and follow prompts

## 2017-04-11 NOTE — Patient Instructions (Signed)
Continue xeralto for at least 1 yr  Rx assistance form completed  Continue other medications as ordered  Follow up as scheduled   Pulmonary Embolism A pulmonary embolism (PE) is a sudden blockage or decrease of blood flow in one lung or both lungs. Most blockages come from a blood clot that travels from the legs or the pelvis to the lungs. PE is a dangerous and potentially life-threatening condition if it is not treated right away. What are the causes? A pulmonary embolism occurs most commonly when a blood clot travels from one of your veins to your lungs. Rarely, PE is caused by air, fat, amniotic fluid, or part of a tumor traveling through your veins to your lungs. What increases the risk? A PE is more likely to develop in:  People who smoke.  People who areolder, especially over 34 years of age.  People who are overweight (obese).  People who sit or lie still for a long time, such as during long-distance travel (over 4 hours), bed rest, hospitalization, or during recovery from certain medical conditions like a stroke.  People who do not engage in much physical activity (sedentary lifestyle).  People who have chronic breathing disorders.  People whohave a personal or family history of blood clots or blood clotting disease.  People whohave peripheral vascular disease (PVD), diabetes, or some types of cancer.  People who haveheart disease, especially if the person had a recent heart attack or has congestive heart failure.  People who have neurological diseases that affect the legs (leg paresis).  People who have had a traumatic injury, such as breaking a hip or leg.  People whohave recently had major or lengthy surgery, especially on the hip, knee, or abdomen.  People who have hada central line placed inside a large vein.  People who takemedicines that contain the hormone estrogen. These include birth control pills and hormone replacement therapy.  Pregnancy or  during childbirth or the postpartum period.  What are the signs or symptoms? The symptoms of a PE usually start suddenly and include:  Shortness of breath while active or at rest.  Coughing or coughing up blood or blood-tinged mucus.  Chest pain that is often worse with deep breaths.  Rapid or irregular heartbeat.  Feeling light-headed or dizzy.  Fainting.  Feelinganxious.  Sweating.  There may also be pain and swelling in a leg if that is where the blood clot started. These symptoms may represent a serious problem that is an emergency. Do not wait to see if the symptoms will go away. Get medical help right away. Call your local emergency services (911 in the [place] Do not drive yourself to the hospital. How is this diagnosed? Your health care provider will take a medical history and perform a physical exam. You may also have other tests, including:  Blood tests to assess the clotting properties of your blood, assess oxygen levels in your blood, and find blood clots.  Imaging tests, such as CT, ultrasound, MRI, X-ray, and other tests to see if you have clots anywhere in your body.  An electrocardiogram (ECG) to look for heart strain from blood clots in the lungs.  How is this treated? The main goals of PE treatment are:  To stop a blood clot from growing larger.  To stop new blood clots from forming.  The type of treatment that you receive depends on many factors, such as the cause of your PE, your risk for bleeding or developing more clots, and other  medical conditions that you have. Sometimes, a combination of treatments is necessary. This condition may be treated with:  Medicines, including newer oral blood thinners (anticoagulants), warfarin, low molecular weight heparins, thrombolytics, or heparins.  Wearing compression stockings or using different types of devices.  Surgery (rare) to remove the blood clot or to place a filter in your abdomen to stop the blood clot  from traveling to your lungs.  Treatments for a PE are often divided into immediate treatment, long-term treatment (up to 3 months after PE), and extended treatment (more than 3 months after PE). Your treatment may continue for several months. This is called maintenance therapy, and it is used to prevent the forming of new blood clots. You can work with your health care provider to choose the treatment program that is best for you. What are anticoagulants? Anticoagulants are medicines that treat PEs. They can stop current blood clots from growing and stop new clots from forming. They cannot dissolve existing clots. Your body dissolves clots by itself over time. Anticoagulants are given by mouth, by injection, or through an IV tube. What are thrombolytics? Thrombolytics are clot-dissolving medicines that are used to dissolve a PE. They carry a high risk of bleeding, so they tend to be used only in severe cases or if you have very low blood pressure. Follow these instructions at home: If you are taking a newer oral anticoagulant:  Take the medicine every single day at the same time each day.  Understand what foods and drugs interact with this medicine.  Understand that there are no regular blood tests required when using this medicine.  Understandthe side effects of this medicine, including excessive bruising or bleeding. Ask your health care provider or pharmacist about other possible side effects. If you are taking warfarin:  Understand how to take warfarin and know which foods can affect how warfarin works in Veterinary surgeon.  Understand that it is dangerous to taketoo much or too little warfarin. Too much warfarin increases the risk of bleeding. Too little warfarin continues to allow the risk for blood clots.  Follow your PT and INR blood testing schedule. The PT and INR results allow your health care provider to adjust your dose of warfarin. It is very important that you have your PT and INR  tested as often as told by your health care provider.  Avoid major changes in your diet, or tell your health care provider before you change your diet. Arrange a visit with a registered dietitian to answer your questions. Many foods, especially foods that are high in vitamin K, can interfere with warfarin and affect the PT and INR results. Eat a consistent amount of foods that are high in vitamin K, such as: ? Spinach, kale, broccoli, cabbage, collard greens, turnip greens, Brussels sprouts, peas, cauliflower, seaweed, and parsley. ? Beef liver and pork liver. ? Green tea. ? Soybean oil.  Tell your health care provider about any and all medicines, vitamins, and supplements that you take, including aspirin and other over-the-counter anti-inflammatory medicines. Be especially cautious with aspirin and anti-inflammatory medicines. Do not take those before you ask your health care provider if it is safe to do so. This is important because many medicines can interfere with warfarin and affect the PT and INR results.  Do not start or stop taking any over-the-counter or prescription medicine unless your health care provider or pharmacist tells you to do so. If you take warfarin, you will also need to do these things:  Hold pressure over cuts for longer than usual.  Tell your dentist and other health care providers that you are taking warfarin before you have any procedures in which bleeding may occur.  Avoid alcohol or drink very small amounts. Tell your health care provider if you change your alcohol intake.  Do not use tobacco products, including cigarettes, chewing tobacco, and e-cigarettes. If you need help quitting, ask your health care provider.  Avoid contact sports.  General instructions  Take over-the-counter and prescription medicines only as told by your health care provider. Anticoagulant medicines can have side effects, including easy bruising and difficulty stopping bleeding. If you  are prescribed an anticoagulant, you will also need to do these things: ? Hold pressure over cuts for longer than usual. ? Tell your dentist and other health care providers that you are taking anticoagulants before you have any procedures in which bleeding may occur. ? Avoid contact sports.  Wear a medical alert bracelet or carry a medical alert card that says you have had a PE.  Ask your health care provider how soon you can go back to your normal activities. Stay active to prevent new blood clots from forming.  Make sure to exercise while traveling or when you have been sitting or standing for a long period of time. It is very important to exercise. Exercise your legs by walking or by tightening and relaxing your leg muscles often. Take frequent walks.  Wear compression stockings as told by your health care provider to help prevent more blood clots from forming.  Do not use tobacco products, including cigarettes, chewing tobacco, and e-cigarettes. If you need help quitting, ask your health care provider.  Keep all follow-up appointments with your health care provider. This is important. How is this prevented? Take these actions to decrease your risk of developing another PE:  Exercise regularly. For at least 30 minutes every day, engage in: ? Activity that involves moving your arms and legs. ? Activity that encourages good blood flow through your body by increasing your heart rate.  Exercise your arms and legs every hour during long-distance travel (over 4 hours). Drink plenty of water and avoid drinking alcohol while traveling.  Avoid sitting or lying in bed for long periods of time without moving your legs.  Maintain a weight that is appropriate for your height. Ask your health care provider what weight is healthy for you.  If you are a woman who is over 69 years of age, avoid unnecessary use of medicines that contain estrogen. These include birth control pills.  Do not smoke,  especially if you take estrogen medicines. If you need help quitting, ask your health care provider.  If you are at very high risk for PE, wear compression stockings.  If you recently had a PE, have regularly scheduled ultrasound testing on your legs to check for new blood clots.  If you are hospitalized, prevention measures may include:  Early walking after surgery, as soon as your health care provider says that it is safe.  Receiving anticoagulants to prevent blood clots. If you cannot take anticoagulants, other options may be available, such as wearing compression stockings or using different types of devices.  Get help right away if:  You have new or increased pain, swelling, or redness in an arm or leg.  You have numbness or tingling in an arm or leg.  You have shortness of breath while active or at rest.  You have chest pain.  You have a  rapid or irregular heartbeat.  You feel light-headed or dizzy.  You cough up blood.  You notice blood in your vomit, bowel movement, or urine.  You have a fever. These symptoms may represent a serious problem that is an emergency. Do not wait to see if the symptoms will go away. Get medical help right away. Call your local emergency services (911 in the [place] Do not drive yourself to the hospital. This information is not intended to replace advice given to you by your health care provider. Make sure you discuss any questions you have with your health care provider. Document Released: 08/05/2000 Document Revised: 01/14/2016 Document Reviewed: 12/03/2014 Elsevier Interactive Patient Education  2017 [employer].

## 2017-04-21 DIAGNOSIS — J3081 Allergic rhinitis due to animal (cat) (dog) hair and dander: Secondary | ICD-10-CM | POA: Diagnosis not present

## 2017-04-21 DIAGNOSIS — J3089 Other allergic rhinitis: Secondary | ICD-10-CM | POA: Diagnosis not present

## 2017-04-21 DIAGNOSIS — J301 Allergic rhinitis due to pollen: Secondary | ICD-10-CM | POA: Diagnosis not present

## 2017-04-25 ENCOUNTER — Other Ambulatory Visit: Payer: Self-pay | Admitting: *Deleted

## 2017-04-25 MED ORDER — RIVAROXABAN 20 MG PO TABS: 20.0000 mg | ORAL_TABLET | Freq: Every day | ORAL | 5 refills | Status: DC

## 2017-04-25 NOTE — Telephone Encounter (Signed)
Patient stated that she has completed the titration and needs Rx for 20mg  once daily. Faxed.

## 2017-04-28 DIAGNOSIS — J3089 Other allergic rhinitis: Secondary | ICD-10-CM | POA: Diagnosis not present

## 2017-04-28 DIAGNOSIS — J3081 Allergic rhinitis due to animal (cat) (dog) hair and dander: Secondary | ICD-10-CM | POA: Diagnosis not present

## 2017-04-28 DIAGNOSIS — J301 Allergic rhinitis due to pollen: Secondary | ICD-10-CM | POA: Diagnosis not present

## 2017-05-08 ENCOUNTER — Encounter: Payer: Self-pay | Admitting: Podiatry

## 2017-05-08 ENCOUNTER — Ambulatory Visit (INDEPENDENT_AMBULATORY_CARE_PROVIDER_SITE_OTHER): Payer: PPO | Admitting: Podiatry

## 2017-05-08 DIAGNOSIS — M79676 Pain in unspecified toe(s): Secondary | ICD-10-CM | POA: Diagnosis not present

## 2017-05-08 DIAGNOSIS — B351 Tinea unguium: Secondary | ICD-10-CM | POA: Diagnosis not present

## 2017-05-08 NOTE — Progress Notes (Signed)
Patient ID: AUBRINA NIEMAN, female   DOB: 27-Sep-1945, 71 y.o.   MRN: [id number]    Subjective: This patient presents again for schedule visit complaining of elongated and thickened toenails which are on call for walking wearing shoes and she requests toenail debridement  Objective: Orientated 3 pitting edema left lower extremity, with long history DP and PT pulses 2/4 bilaterally Capillary reflex immediate bilaterally Sensation to 10 g monofilament wire intact 5/5 bilaterally Vibratory sensation reactive bilaterally Ankle reflex equal and reactive bilaterally HAV bilaterally Hammertoe second bilaterally Manual motor testing dorsi flexion, plantar flexion, inversion, eversion 5/5 bilaterally No open skin lesions bilaterally  Atrophic skin with absent hair growth bilaterally The toenails are brittle, deformed, discolored, hypertrophic and tender direct palpation 6-10 Manual motor testing dorsi flexion, plantar flexion 5/5 bilaterally  Assessment: Symptomatic onychomycoses 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

## 2017-05-10 ENCOUNTER — Ambulatory Visit: Payer: PPO | Admitting: Podiatry

## 2017-05-12 DIAGNOSIS — J301 Allergic rhinitis due to pollen: Secondary | ICD-10-CM | POA: Diagnosis not present

## 2017-05-12 DIAGNOSIS — J3089 Other allergic rhinitis: Secondary | ICD-10-CM | POA: Diagnosis not present

## 2017-05-12 DIAGNOSIS — J3081 Allergic rhinitis due to animal (cat) (dog) hair and dander: Secondary | ICD-10-CM | POA: Diagnosis not present

## 2017-05-17 ENCOUNTER — Ambulatory Visit (INDEPENDENT_AMBULATORY_CARE_PROVIDER_SITE_OTHER): Payer: PPO | Admitting: Internal Medicine

## 2017-05-17 ENCOUNTER — Encounter: Payer: Self-pay | Admitting: Internal Medicine

## 2017-05-17 VITALS — BP 136/70 | HR 103 | Temp 98.6°F | Resp 20 | Ht 63.0 in | Wt 222.2 lb

## 2017-05-17 DIAGNOSIS — J4520 Mild intermittent asthma, uncomplicated: Secondary | ICD-10-CM

## 2017-05-17 DIAGNOSIS — E785 Hyperlipidemia, unspecified: Secondary | ICD-10-CM | POA: Diagnosis not present

## 2017-05-17 DIAGNOSIS — R053 Chronic cough: Secondary | ICD-10-CM

## 2017-05-17 DIAGNOSIS — R600 Localized edema: Secondary | ICD-10-CM

## 2017-05-17 DIAGNOSIS — Z23 Encounter for immunization: Secondary | ICD-10-CM | POA: Diagnosis not present

## 2017-05-17 DIAGNOSIS — J3089 Other allergic rhinitis: Secondary | ICD-10-CM

## 2017-05-17 DIAGNOSIS — I10 Essential (primary) hypertension: Secondary | ICD-10-CM | POA: Diagnosis not present

## 2017-05-17 DIAGNOSIS — I2609 Other pulmonary embolism with acute cor pulmonale: Secondary | ICD-10-CM | POA: Diagnosis not present

## 2017-05-17 DIAGNOSIS — R05 Cough: Secondary | ICD-10-CM | POA: Diagnosis not present

## 2017-05-17 DIAGNOSIS — I872 Venous insufficiency (chronic) (peripheral): Secondary | ICD-10-CM

## 2017-05-17 NOTE — Progress Notes (Signed)
Patient ID: MAVERICK PATMAN, female   DOB: June 21, 1946, 71 y.o.   MRN: [id number]   Location:  [facility] clinic  Provider: Dr. Eulas Post  Code Status:  Goals of Care:  Advanced Directives 05/17/2017  Does Patient Have a Medical Advance Directive? Yes  Type of Advance Directive Living will  Does patient want to make changes to medical advance directive? No - Patient declined  Would patient like information on creating a medical advance directive? -     Chief Complaint  Patient presents with  . Medical Management of Chronic Issues    4 mo f/u for HTN, cough, and acid reflux    HPI: Patient is a 71 y.o. female seen today for follow up of cough and HTN. Patient had PE last month and was admitted to [facility] hospital for 3 days. Patient states she is taking her Xarelto and dines any bouts of shortness of breath. She says that she is unable to afford the price of her spiriva and symbicort. She states she was supposed to take her Symbicort twice a day but has cut it down to once a day to make it last.   She states some fatigue since her stay in the hospital. She says she no longer has "that extra energy" since the PE.   HTN - Her BP is stable on losartan 75 mg daily, procardia XL and lasix.  Hyperlipidemia - is stable on pravachol.    Past Medical History:  Diagnosis Date  . Abnormal Pap smear    Over 49yrs ago  . ALKALINE PHOSPHATASE, ELEVATED 07/01/2009   Qualifier: Diagnosis of  By: [name] MD, [name]   . Allergic rhinitis   . Anemia   . Arthritis   . Asthma   . Cough    occasional  . Cystocele 11/05/08  . Diverticulosis of colon   . DJD (degenerative joint disease)    no per pt  . Elevated alkaline phosphatase level   . GERD (gastroesophageal reflux disease)   . H/O urinary incontinence 11/06/2008  . H/O varicella   . Hard measles   . Hx of colonic polyps   . Hypercholesterolemia   . Hypertension   . Leg swelling    occasional  . 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  Overweight(278.02)   . Rectocele 11/06/2008   Large  . Sebaceous cyst    on back  . Sinusitis   . Venous insufficiency   . Wheezing    occasional  . Yeast infection     Past Surgical History:  Procedure Laterality Date  . ABDOMINAL HYSTERECTOMY  1988  . ANTERIOR AND POSTERIOR REPAIR  11/05/08  . APOGEE / PERIGEE REPAIR  10/2008   Dr. Octavio Manns  . bladder tack  2011  . BREAST SURGERY  1978   cyst removed from both removed  . COLONOSCOPY    . ESOPHAGOGASTRODUODENOSCOPY      Allergies  Allergen Reactions  . Demerol Hives    All over the body   . Lisinopril-Hydrochlorothiazide Hives    All over the body  . Penicillins Hives    Has patient had a PCN reaction causing immediate rash, facial/tongue/throat swelling, SOB or lightheadedness with hypotension: No Has patient had a PCN reaction causing severe rash involving mucus membranes or skin necrosis: No Has patient had a PCN reaction that required hospitalization: No Has patient had a PCN reaction occurring within the last 10 years: No If all of the above answers are "NO", then may  proceed with Cephalosporin use.   All over the body    Outpatient Encounter Prescriptions as of 05/17/2017  Medication Sig  . albuterol (PROVENTIL HFA;VENTOLIN HFA) 108 (90 Base) MCG/ACT inhaler Inhale 2 puffs into the lungs every 6 (six) hours as needed for wheezing or shortness of breath.  . budesonide-formoterol (SYMBICORT) 160-4.5 MCG/ACT inhaler Inhale 2 puffs into the lungs 2 (two) times daily.    . Calcium Carb-Cholecalciferol (CALCIUM 600 + D PO) Take by mouth. Take one tablet daily  . cetirizine (ZYRTEC) 10 MG tablet Take 10 mg by mouth daily.  . chlorpheniramine-HYDROcodone (TUSSIONEX PENNKINETIC ER) 10-8 MG/5ML SUER Take 5 mLs by mouth every 12 (twelve) hours as needed for cough.  . fluticasone (FLONASE) 50 MCG/ACT nasal spray 2 sprays daily.  . furosemide (LASIX) 40 MG tablet TAKE 1 TABLET (40 MG TOTAL) BY MOUTH DAILY.  Marland Kitchen KLOR-CON M20  20 MEQ tablet TAKE 2 TABLETS EVERY DAY FOR POTASSIUM SUPPLEMENT  . levocetirizine (XYZAL) 5 MG tablet Take 5 mg by mouth every evening.    Marland Kitchen losartan (COZAAR) 25 MG tablet Take 25 mg by mouth daily. Take with 50 mg tablet for a total of 75 mg daily.  Marland Kitchen losartan (COZAAR) 50 MG tablet TAKE 1 TABLET EVERY DAY FOR BLOOD PRESSURE  . montelukast (SINGULAIR) 10 MG tablet Take 10 mg by mouth at bedtime.    . Multiple Vitamins-Minerals (MACULAR VITAMIN BENEFIT PO) Take 1 capsule by mouth 2 (two) times daily.    Marland Kitchen NIFEdipine (PROCARDIA XL/ADALAT-CC) 60 MG 24 hr tablet TAKE 1 TABLET EVERY DAY  . NON FORMULARY Allergy shots once every other week  . Omega-3 Fatty Acids (FISH OIL PO) Take by mouth daily.  . pantoprazole (PROTONIX) 40 MG tablet Take one tablet by mouth twice daily for stomach  . pravastatin (PRAVACHOL) 40 MG tablet TAKE 1 TABLET BY MOUTH EVERY DAY  . psyllium (METAMUCIL) 58.6 % packet Take 1 packet by mouth daily as needed (constipation).  . ranitidine (ZANTAC) 300 MG tablet Take 300 mg by mouth at bedtime.  . rivaroxaban (XARELTO) 20 MG TABS tablet Take 1 tablet (20 mg total) by mouth daily.  Marland Kitchen SPIRIVA HANDIHALER 18 MCG inhalation capsule 18 mcg 2 (two) times daily.  . vitamin B-12 (CYANOCOBALAMIN) 1000 MCG tablet Take 1,000 mcg by mouth daily.   No facility-administered encounter medications on file as of 05/17/2017.     Review of Systems:  Review of Systems  Constitutional: Positive for fatigue (States less engergy than normal.). Negative for activity change, appetite change, chills, diaphoresis, fever and unexpected weight change.  HENT: Negative for congestion, dental problem, drooling, ear discharge, ear pain, facial swelling, hearing loss, mouth sores, nosebleeds, postnasal drip, rhinorrhea, sinus pain, sinus pressure, sneezing, sore throat, tinnitus, trouble swallowing and voice change.   Eyes: Negative for photophobia, pain, discharge, redness, itching and visual disturbance.    Respiratory: Negative for apnea, cough, choking, chest tightness, shortness of breath, wheezing and stridor.   Cardiovascular: Negative for chest pain, palpitations and leg swelling.  Gastrointestinal: Negative for abdominal distention, abdominal pain, anal bleeding, blood in stool, constipation, diarrhea, nausea, rectal pain and vomiting.  Endocrine: Negative for cold intolerance, heat intolerance, polydipsia, polyphagia and polyuria.  Genitourinary: Negative for decreased urine volume, difficulty urinating, dyspareunia, dysuria, enuresis, flank pain, frequency, genital sores, hematuria and urgency.  Musculoskeletal: Negative for arthralgias, back pain and gait problem.  Skin: Negative for color change, pallor, rash and wound.  Allergic/Immunologic: Negative for environmental allergies, food allergies and immunocompromised  state.  Neurological: Negative for dizziness, tremors, seizures, syncope, facial asymmetry, speech difficulty, weakness, light-headedness, numbness and headaches.  Psychiatric/Behavioral: Negative for agitation, behavioral problems, confusion, decreased concentration, dysphoric mood, sleep disturbance and suicidal ideas. The patient is not nervous/anxious.     Health Maintenance  Topic Date Due  . Hepatitis C Screening  Jun 23, 1946  . INFLUENZA VACCINE  03/22/2017  . TETANUS/TDAP  08/23/2023 (Originally 10/29/1962)  . MAMMOGRAM  07/01/2017  . COLONOSCOPY  06/12/2018  . DEXA SCAN  Completed  . PNA vac Low Risk Adult  Completed    Physical Exam: Vitals:   05/17/17 0929  BP: 136/70  Pulse: (!) 103  Resp: 20  Temp: 98.6 F (37 C)  TempSrc: Oral  SpO2: 98%  Weight: 222 lb 3.2 oz (100.8 kg)  Height: 5\' 3"  (1.6 m)   Body mass index is 39.36 kg/m. Physical Exam  Constitutional: She is oriented to person, place, and time. She appears well-developed and well-nourished. No distress.  HENT:  Head: Normocephalic and atraumatic.  Right Ear: External ear normal.  Left  Ear: External ear normal.  Nose: Nose normal.  Mouth/Throat: No oropharyngeal exudate.  Eyes: Pupils are equal, round, and reactive to light. Conjunctivae and EOM are normal. Right eye exhibits no discharge. Left eye exhibits no discharge. No scleral icterus.  Neck: Normal range of motion. Neck supple. No JVD present. No tracheal deviation present. No thyromegaly present.  Cardiovascular: Normal rate, regular rhythm, normal heart sounds and intact distal pulses.  Exam reveals no gallop and no friction rub.   No murmur heard. Pulmonary/Chest: Effort normal and breath sounds normal. No stridor. No respiratory distress. She has no wheezes. She has no rales. She exhibits no tenderness.  Abdominal: Soft. Bowel sounds are normal. She exhibits no distension and no mass. There is no tenderness. There is no rebound and no guarding.  Musculoskeletal: Normal range of motion. She exhibits edema (bilateral lower extremities wears compression stokings). She exhibits no tenderness or deformity.  Lymphadenopathy:    She has no cervical adenopathy.  Neurological: She is alert and oriented to person, place, and time. No cranial nerve deficit. Coordination normal.  Skin: Skin is warm and dry. No rash noted. She is not diaphoretic. No erythema. No pallor.  Psychiatric: She has a normal mood and affect. Her behavior is normal. Judgment and thought content normal.    Labs reviewed: Basic Metabolic Panel:  Recent Labs  09/09/16 1126  03/26/17 1136 03/27/17 0339 03/28/17 0339  NA 140  < > 141 139 142  K 4.6  < > 4.1 4.0 3.8  CL 104  < > 110 110 115*  CO2 27  < > 22 20* 21*  GLUCOSE 95  < > 140* 123* 105*  BUN 9  < > 13 13 13   CREATININE 0.82  < > 0.96 0.85 0.72  CALCIUM 9.9  < > 9.4 8.6* 8.8*  TSH 1.42  --   --   --   --   < > = values in this interval not displayed. Liver Function Tests:  Recent Labs  09/09/16 1126 01/18/17 0835  AST 21 20  ALT 14 14  ALKPHOS 173* 167*  BILITOT 0.5 0.5  PROT  7.7 7.3  ALBUMIN 4.2 4.1   No results for input(s): LIPASE, AMYLASE in the last 8760 hours. No results for input(s): AMMONIA in the last 8760 hours. CBC:  Recent Labs  09/09/16 1126  03/27/17 0339 03/28/17 0339 03/29/17 0516  WBC 5.7  < >  9.1 7.9 6.7  NEUTROABS 2,736  --   --   --   --   HGB 12.4  < > 10.5* 10.7* 10.9*  HCT 38.9  < > 32.3* 31.9* 32.9*  MCV 81.0  < > 79.0 79.8 77.8*  PLT 285  < > 169 185 210  < > = values in this interval not displayed. Lipid Panel:  Recent Labs  09/09/16 1126 01/18/17 0835  CHOL 168 175  HDL 61 65  LDLCALC 90 95  TRIG 83 76  CHOLHDL 2.8 2.7   Lab Results  Component Value Date   HGBA1C 5.6 05/26/2014    Procedures since last visit: No results found.  Assessment/Plan  Continue taking prescribed medications as scheduled.   Continue activities as tolerated due to fatigue.  Patient to return in 1 week for fasting lab work: Lipid  Call with results.  Lab work before visit: BMP, CBCw/diff, Lipid  Return for follow up in 3 months   Labs/tests ordered:   Next appt:  Visit date not found  Reanna Scoggin C. Rhylen Shaheen, Student AGACNP

## 2017-05-17 NOTE — Patient Instructions (Addendum)
Influenza vaccine given today  Continue current medications as ordered  Will need Xeralto x 1 yr   Samples of xeralto, symbicort, and xyzal given today  Follow up with specialists as scheduled  Follow up in 3 mos for PE, hyperlipidemia, HTN. Fasting labs prior to appt

## 2017-05-17 NOTE — Progress Notes (Signed)
Patient ID: MARGARETE HORACE, female   DOB: [birth year], 71 y.o.   MRN: [id number]    Location:  [facility] Place of Service: OFFICE  Chief Complaint  Patient presents with  . Medical Management of Chronic Issues    4 mo f/u for HTN, cough, and acid reflux    HPI:  71 yo female seen today for f/u. She c/o decreased energy. No SOB, CP, palpitations. No easy bruising, melenotic stools, BRBPR. She still has annoying cough. No f/c. No HA, dizziness.   Hx dysphagia/cough - last upper esophageal sphincter balloon dilation and suspension microdirect laryngoscopy on 04/18/16 by Dr Rowe Clack at [facility]. Swallowing much improved and she no longer has sensation of being strangled. Takes protonix BID for GERD.  She is able to swallow potassium tabs without difficulty.   HTN - BP stable on on losartan 75 mg daily, procardia XL and lasix  Asthma - no recent flares. stable on HFA prn. She takes spiriva daily, singulair daily and symbicort every other day. No asthma exacerbations/attacks. She also takes xyzal and flonase for allergic rhinitis.  She gets allergy shots weekly. Followed by pulmonary  Hyperlipidemia - stable; takes pravachol. LDL 95. No myalgias  Arthritis - stable. Pain controlled  Past Medical History:  Diagnosis Date  . Abnormal Pap smear    Over 50yr ago  . ALKALINE PHOSPHATASE, ELEVATED 07/01/2009   Qualifier: Diagnosis of  By: NLenna GilfordMD, SDeborra Medina  . Allergic rhinitis   . Anemia   . Arthritis   . Asthma   . Cough    occasional  . Cystocele 11/05/08  . Diverticulosis of colon   . DJD (degenerative joint disease)    no per pt  . Elevated alkaline phosphatase level   . GERD (gastroesophageal reflux disease)   . H/O urinary incontinence 11/06/2008  . H/O varicella   . Hard measles   . Hx of colonic polyps   . Hypercholesterolemia   . Hypertension   . Leg swelling    occasional  . 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 Overweight(278.02)   . Rectocele  11/06/2008   Large  . Sebaceous cyst    on back  . Sinusitis   . Venous insufficiency   . Wheezing    occasional  . Yeast infection     Past Surgical History:  Procedure Laterality Date  . ABDOMINAL HYSTERECTOMY  1988  . ANTERIOR AND POSTERIOR REPAIR  11/05/08  . APOGEE / PERIGEE REPAIR  10/2008   Dr. AOctavio Manns . bladder tack  2011  . BREAST SURGERY  1978   cyst removed from both removed  . COLONOSCOPY    . ESOPHAGOGASTRODUODENOSCOPY      Patient Care Team: CGildardo Cranker DO as PCP - General (Internal Medicine) HMonna Fam MD as Consulting Physician (Ophthalmology)  Social History   Social History  . Marital status: Married    Spouse name: SEbony Hail . Number of children: 2  . Years of education: N/A   Occupational History  . LPN     Social History Main Topics  . Smoking status: Never Smoker  . Smokeless tobacco: Never Used  . Alcohol use 1.2 oz/week    2 Cans of beer per week     Comment: 2 beers on the weekend  . Drug use: No  . Sexual activity: Not Currently    Partners: Male    Birth control/ protection: None   Other Topics Concern  . Not on file  Social History Narrative   Married   Never smoked   Alcohol few beers on week-end   Exercise walk 5 days a week for one hour   No Advance Directive      reports that she has never smoked. She has never used smokeless tobacco. She reports that she drinks about 1.2 oz of alcohol per week . She reports that she does not use drugs.  Family History  Problem Relation Age of Onset  . Aneurysm Mother   . Lung cancer Father   . Hypertension Daughter   . Hypertension Daughter   . Colon cancer Neg Hx   . Esophageal cancer Neg Hx   . Stomach cancer Neg Hx   . Rectal cancer Neg Hx   . Colon polyps Neg Hx    Family Status  Relation Status  . Mother Deceased at age 22       stroke  . Father Deceased at age 34       cancer  . Daughter Alive  . Daughter Alive  . Neg Hx (Not Specified)      Allergies  Allergen Reactions  . Demerol Hives    All over the body   . Lisinopril-Hydrochlorothiazide Hives    All over the body  . Penicillins Hives    Has patient had a PCN reaction causing immediate rash, facial/tongue/throat swelling, SOB or lightheadedness with hypotension: No Has patient had a PCN reaction causing severe rash involving mucus membranes or skin necrosis: No Has patient had a PCN reaction that required hospitalization: No Has patient had a PCN reaction occurring within the last 10 years: No If all of the above answers are "NO", then may proceed with Cephalosporin use.   All over the body    Medications: Patient's Medications  New Prescriptions   No medications on file  Previous Medications   ALBUTEROL (PROVENTIL HFA;VENTOLIN HFA) 108 (90 BASE) MCG/ACT INHALER    Inhale 2 puffs into the lungs every 6 (six) hours as needed for wheezing or shortness of breath.   BUDESONIDE-FORMOTEROL (SYMBICORT) 160-4.5 MCG/ACT INHALER    Inhale 2 puffs into the lungs 2 (two) times daily.     CALCIUM CARB-CHOLECALCIFEROL (CALCIUM 600 + D PO)    Take by mouth. Take one tablet daily   CETIRIZINE (ZYRTEC) 10 MG TABLET    Take 10 mg by mouth daily.   CHLORPHENIRAMINE-HYDROCODONE (TUSSIONEX PENNKINETIC ER) 10-8 MG/5ML SUER    Take 5 mLs by mouth every 12 (twelve) hours as needed for cough.   FLUTICASONE (FLONASE) 50 MCG/ACT NASAL SPRAY    2 sprays daily.   FUROSEMIDE (LASIX) 40 MG TABLET    TAKE 1 TABLET (40 MG TOTAL) BY MOUTH DAILY.   KLOR-CON M20 20 MEQ TABLET    TAKE 2 TABLETS EVERY DAY FOR POTASSIUM SUPPLEMENT   LEVOCETIRIZINE (XYZAL) 5 MG TABLET    Take 5 mg by mouth every evening.     LOSARTAN (COZAAR) 25 MG TABLET    Take 25 mg by mouth daily. Take with 50 mg tablet for a total of 75 mg daily.   LOSARTAN (COZAAR) 50 MG TABLET    TAKE 1 TABLET EVERY DAY FOR BLOOD PRESSURE   MONTELUKAST (SINGULAIR) 10 MG TABLET    Take 10 mg by mouth at bedtime.     MULTIPLE  VITAMINS-MINERALS (MACULAR VITAMIN BENEFIT PO)    Take 1 capsule by mouth 2 (two) times daily.     NIFEDIPINE (PROCARDIA XL/ADALAT-CC) 60 MG 24 HR TABLET  TAKE 1 TABLET EVERY DAY   NON FORMULARY    Allergy shots once every other week   OMEGA-3 FATTY ACIDS (FISH OIL PO)    Take by mouth daily.   PANTOPRAZOLE (PROTONIX) 40 MG TABLET    Take one tablet by mouth twice daily for stomach   PRAVASTATIN (PRAVACHOL) 40 MG TABLET    TAKE 1 TABLET BY MOUTH EVERY DAY   PSYLLIUM (METAMUCIL) 58.6 % PACKET    Take 1 packet by mouth daily as needed (constipation).   RANITIDINE (ZANTAC) 300 MG TABLET    Take 300 mg by mouth at bedtime.   RIVAROXABAN (XARELTO) 20 MG TABS TABLET    Take 1 tablet (20 mg total) by mouth daily.   SPIRIVA HANDIHALER 18 MCG INHALATION CAPSULE    18 mcg 2 (two) times daily.   VITAMIN B-12 (CYANOCOBALAMIN) 1000 MCG TABLET    Take 1,000 mcg by mouth daily.  Modified Medications   No medications on file  Discontinued Medications   No medications on file    Review of Systems  Constitutional: Positive for fatigue.  Respiratory: Positive for cough.   All other systems reviewed and are negative.   Vitals:   05/17/17 0929  BP: 136/70  Pulse: (!) 103  Resp: 20  Temp: 98.6 F (37 C)  TempSrc: Oral  SpO2: 98%  Weight: 222 lb 3.2 oz (100.8 kg)  Height: '5\' 3"'$  (1.6 m)   Body mass index is 39.36 kg/m.  Physical Exam  Constitutional: She is oriented to person, place, and time. She appears well-developed and well-nourished.  HENT:  Mouth/Throat: Oropharynx is clear and moist. No oropharyngeal exudate.  MMM; no oral thrush  Eyes: Pupils are equal, round, and reactive to light. No scleral icterus.  Neck: Neck supple. Carotid bruit is not present. No tracheal deviation present. No thyromegaly present.  Cardiovascular: Normal rate, regular rhythm and intact distal pulses.  Exam reveals no gallop and no friction rub.   Murmur (1/6 SEM) heard. No LE edema b/l. no calf TTP.    Pulmonary/Chest: Effort normal and breath sounds normal. No stridor. No respiratory distress. She has no wheezes. She has no rales.  Abdominal: Soft. Normal appearance and bowel sounds are normal. She exhibits no distension and no mass. There is no hepatomegaly. There is no tenderness. There is no rigidity, no rebound and no guarding. No hernia.  Musculoskeletal: She exhibits edema.  Lymphadenopathy:    She has no cervical adenopathy.  Neurological: She is alert and oriented to person, place, and time.  Skin: Skin is warm and dry. No rash noted.  Psychiatric: She has a normal mood and affect. Her behavior is normal. Judgment and thought content normal.     Labs reviewed: Admission on 03/26/2017, Discharged on 03/29/2017  Component Date Value Ref Range Status  . Sodium 03/26/2017 141  135 - 145 mmol/L Final  . Potassium 03/26/2017 4.1  3.5 - 5.1 mmol/L Final  . Chloride 03/26/2017 110  101 - 111 mmol/L Final  . CO2 03/26/2017 22  22 - 32 mmol/L Final  . Glucose, Bld 03/26/2017 140* 65 - 99 mg/dL Final  . BUN 03/26/2017 13  6 - 20 mg/dL Final  . Creatinine, Ser 03/26/2017 0.96  0.44 - 1.00 mg/dL Final  . Calcium 03/26/2017 9.4  8.9 - 10.3 mg/dL Final  . GFR calc non Af Amer 03/26/2017 57* >60 mL/min Final  . GFR calc Af Amer 03/26/2017 >60  >60 mL/min Final   Comment: (NOTE) The  eGFR has been calculated using the CKD EPI equation. This calculation has not been validated in all clinical situations. eGFR's persistently <60 mL/min signify possible Chronic Kidney Disease.   . Anion gap 03/26/2017 9  5 - 15 Final  . WBC 03/26/2017 10.7* 4.0 - 10.5 K/uL Final  . RBC 03/26/2017 4.48  3.87 - 5.11 MIL/uL Final  . Hemoglobin 03/26/2017 12.0  12.0 - 15.0 g/dL Final  . HCT 03/26/2017 35.5* 36.0 - 46.0 % Final  . MCV 03/26/2017 79.2  78.0 - 100.0 fL Final  . MCH 03/26/2017 26.8  26.0 - 34.0 pg Final  . MCHC 03/26/2017 33.8  30.0 - 36.0 g/dL Final  . RDW 03/26/2017 14.8  11.5 - 15.5 % Final   . Platelets 03/26/2017 191  150 - 400 K/uL Final  . B Natriuretic Peptide 03/26/2017 445.4* 0.0 - 100.0 pg/mL Final  . Troponin i, poc 03/26/2017 0.03  0.00 - 0.08 ng/mL Final  . Comment 3 03/26/2017          Final   Comment: Due to the release kinetics of cTnI, a negative result within the first hours of the onset of symptoms does not rule out myocardial infarction with certainty. If myocardial infarction is still suspected, repeat the test at appropriate intervals.   . Prothrombin Time 03/26/2017 13.9  11.4 - 15.2 seconds Final  . INR 03/26/2017 1.07   Final  . aPTT 03/26/2017 28  24 - 36 seconds Final  . Heparin Unfractionated 03/26/2017 0.46  0.30 - 0.70 IU/mL Final   Comment:        IF HEPARIN RESULTS ARE BELOW EXPECTED VALUES, AND PATIENT DOSAGE HAS BEEN CONFIRMED, SUGGEST FOLLOW UP TESTING OF ANTITHROMBIN III LEVELS.   . Weight 03/27/2017 3648  oz Final-Edited  . Height 03/27/2017 63  in Final-Edited  . BP 03/27/2017 150/95  mmHg Final-Edited  . Sodium 03/27/2017 139  135 - 145 mmol/L Final  . Potassium 03/27/2017 4.0  3.5 - 5.1 mmol/L Final  . Chloride 03/27/2017 110  101 - 111 mmol/L Final  . CO2 03/27/2017 20* 22 - 32 mmol/L Final  . Glucose, Bld 03/27/2017 123* 65 - 99 mg/dL Final  . BUN 03/27/2017 13  6 - 20 mg/dL Final  . Creatinine, Ser 03/27/2017 0.85  0.44 - 1.00 mg/dL Final  . Calcium 03/27/2017 8.6* 8.9 - 10.3 mg/dL Final  . GFR calc non Af Amer 03/27/2017 >60  >60 mL/min Final  . GFR calc Af Amer 03/27/2017 >60  >60 mL/min Final   Comment: (NOTE) The eGFR has been calculated using the CKD EPI equation. This calculation has not been validated in all clinical situations. eGFR's persistently <60 mL/min signify possible Chronic Kidney Disease.   . Anion gap 03/27/2017 9  5 - 15 Final  . Prothrombin Time 03/27/2017 15.0  11.4 - 15.2 seconds Final  . INR 03/27/2017 1.17   Final  . MRSA by PCR 03/26/2017 NEGATIVE  NEGATIVE Final   Comment:        The  GeneXpert MRSA Assay (FDA approved for NASAL specimens only), is one component of a comprehensive MRSA colonization surveillance program. It is not intended to diagnose MRSA infection nor to guide or monitor treatment for MRSA infections.   . WBC 03/27/2017 9.1  4.0 - 10.5 K/uL Final  . RBC 03/27/2017 4.09  3.87 - 5.11 MIL/uL Final  . Hemoglobin 03/27/2017 10.5* 12.0 - 15.0 g/dL Final  . HCT 03/27/2017 32.3* 36.0 - 46.0 % Final  .  MCV 03/27/2017 79.0  78.0 - 100.0 fL Final  . MCH 03/27/2017 25.7* 26.0 - 34.0 pg Final  . MCHC 03/27/2017 32.5  30.0 - 36.0 g/dL Final  . RDW 03/27/2017 15.1  11.5 - 15.5 % Final  . Platelets 03/27/2017 169  150 - 400 K/uL Final  . Heparin Unfractionated 03/27/2017 0.49  0.30 - 0.70 IU/mL Final   Comment:        IF HEPARIN RESULTS ARE BELOW EXPECTED VALUES, AND PATIENT DOSAGE HAS BEEN CONFIRMED, SUGGEST FOLLOW UP TESTING OF ANTITHROMBIN III LEVELS.   Marland Kitchen Heparin Unfractionated 03/28/2017 0.28* 0.30 - 0.70 IU/mL Final   Comment:        IF HEPARIN RESULTS ARE BELOW EXPECTED VALUES, AND PATIENT DOSAGE HAS BEEN CONFIRMED, SUGGEST FOLLOW UP TESTING OF ANTITHROMBIN III LEVELS.   . WBC 03/28/2017 7.9  4.0 - 10.5 K/uL Final  . RBC 03/28/2017 4.00  3.87 - 5.11 MIL/uL Final  . Hemoglobin 03/28/2017 10.7* 12.0 - 15.0 g/dL Final  . HCT 03/28/2017 31.9* 36.0 - 46.0 % Final  . MCV 03/28/2017 79.8  78.0 - 100.0 fL Final  . MCH 03/28/2017 26.8  26.0 - 34.0 pg Final  . MCHC 03/28/2017 33.5  30.0 - 36.0 g/dL Final  . RDW 03/28/2017 15.0  11.5 - 15.5 % Final  . Platelets 03/28/2017 185  150 - 400 K/uL Final  . Sodium 03/28/2017 142  135 - 145 mmol/L Final  . Potassium 03/28/2017 3.8  3.5 - 5.1 mmol/L Final  . Chloride 03/28/2017 115* 101 - 111 mmol/L Final  . CO2 03/28/2017 21* 22 - 32 mmol/L Final  . Glucose, Bld 03/28/2017 105* 65 - 99 mg/dL Final  . BUN 03/28/2017 13  6 - 20 mg/dL Final  . Creatinine, Ser 03/28/2017 0.72  0.44 - 1.00 mg/dL Final  . Calcium  03/28/2017 8.8* 8.9 - 10.3 mg/dL Final  . GFR calc non Af Amer 03/28/2017 >60  >60 mL/min Final  . GFR calc Af Amer 03/28/2017 >60  >60 mL/min Final   Comment: (NOTE) The eGFR has been calculated using the CKD EPI equation. This calculation has not been validated in all clinical situations. eGFR's persistently <60 mL/min signify possible Chronic Kidney Disease.   . Anion gap 03/28/2017 6  5 - 15 Final  . Recommendations-F5LEID: 03/28/2017 Comment   Final   Comment: (NOTE) Result:  Negative (no mutation found) Factor V Leiden is a specific mutation (R506Q) in the factor V gene that is associated with an increased risk of venous thrombosis. Factor V Leiden is more resistant to inactivation by activated protein C.  As a result, factor V persists in the circulation leading to a mild hyper- coagulable state.  The Leiden mutation accounts for 90% - 95% of APC resistance.  Factor V Leiden has been reported in patients with deep vein thrombosis, pulmonary embolus, central retinal vein occlusion, cerebral sinus thrombosis and hepatic vein thrombosis. Other risk factors to be considered in the workup for venous thrombosis include the G20210A mutation in the factor II (prothrombin) gene, protein S and C deficiency, and antithrombin deficiencies. Anticardiolipin antibody and lupus anticoagulant analysis may be appropriate for certain patients, as well as homocysteine levels. Contact your local [facility] for information on how to order additi                          onal testing if desired. **Genetic counselors are available for health care providers to**  discuss results  at [phone]). Methodology: DNA analysis of the Factor V gene was performed by allele-specific PCR. The diagnostic sensitivity and specificity is >99% for both. Molecular-based testing is highly accurate, but as in any laboratory test, diagnostic errors may occur. All test results must be combined with clinical  information for the most accurate interpretation. This test was developed and its performance characteristics determined by [facility]. It has not been cleared or approved by the Food and Drug Administration. References: Voelkerding K (1996).  Clin Lab Med [phone]. Allison Quarry, PhD, [facility] Ruben Reason, PhD, [facility] Annetta Maw, M.S., PhD, [facility] Alfredo Bach, PhD, [facility] Norva Riffle, PhD, [facility] Earlean Polka PhD, [facility] Performed At: [facility] [address] [place], [place] [id number] Nechama Guard MD [id number]:[id number]                          3   . PTT Lupus Anticoagulant 03/28/2017 35.3  0.0 - 51.9 sec Final   Comment: (NOTE) Additional testing confirms the presence of heparin in the test sample. Results obtained after heparin neutralization.   Marland Kitchen DRVVT 03/28/2017 42.4  0.0 - 47.0 sec Final  . Lupus Anticoag Interp 03/28/2017 Comment:   Corrected   Comment: (NOTE) No lupus anticoagulant was detected. Performed At: [facility] [place], [place] [id number] Lindon Romp MD [id number]   . Recommendations-PTGENE: 03/28/2017 Comment   Final   Comment: (NOTE) NEGATIVE No mutation identified. Comment: A point mutation (G20210A) in the factor II (prothrombin) gene is the second most common cause of inherited thrombophilia. The incidence of this mutation in the [place] Caucasian population is about 2% and in the [place] American population it is approximately 0.5%. This mutation is rare in the [place] and Native American population. Being heterozygous for a prothrombin mutation increases the risk for developing venous thrombosis about 2 to 3 times above the general population risk. Being homozygous for the prothrombin gene mutation increases the relative risk for venous thrombosis further, although it is not yet known how much further the risk is increased. In women heterozygous for the prothrombin gene mutation, the use of  estrogen containing oral contraceptives increases the relative risk of venous thrombosis about 16 times and the risk of developing cerebral thrombosis is also significantly increased. In pregnancy the pr                          othrombin gene mutation increases risk for venous thrombosis and may increase risk for stillbirth, placental abruption, pre-eclampsia and fetal growth restriction. If the patient possesses two or more congenital or acquired thrombophilic risk factors, the risk for thrombosis may rise to more than the sum of the risk ratios for the individual mutations. This assay detects only the prothrombin G20210A mutation and does not measure genetic abnormalities elsewhere in the genome. Other thrombotic risk factors may be pursued through systematic clinical laboratory analysis. These factors include the R506Q (Leiden) mutation in the Factor V gene, plasma homocysteine levels, as well as testing for deficiencies of antithrombin III, protein C and protein S. Genetic Counselors are available for health care providers to discuss results at [phone]). Methodology: DNA analysis of the Factor II gene was performed by PCR amplification followed by restriction analysis. The di                          agnostic sensitivity is >  99% for both. All the tests must be combined with clinical information for the most accurate interpretation. Molecular-based testing is highly accurate, but as in any laboratory test, diagnostic errors may occur. This test was developed and its performance characteristics determined by [facility]. It has not been cleared or approved by the Food and Drug Administration. Poort SR, et al. Blood. 1996; 16:1096-0454. Varga EA. Circulation. 2004; 098:J19-J47. Mervin Hack, et [place]; 19:700-703. Allison Quarry, PhD, [facility] Ruben Reason, PhD, [facility] Annetta Maw, M.S., PhD, [facility] Alfredo Bach, PhD,  [facility] Norva Riffle, PhD, [facility] Earlean Polka, PhD, [facility] Performed At: [facility] [address] [place], [place] [id number] Nechama Guard MD [id number]   . WBC 03/29/2017 6.7  4.0 - 10.5 K/uL Final  . RBC 03/29/2017 4.23  3.87 - 5.11 MIL/uL Final  . Hemoglobin 03/29/2017 10.9* 12.0 - 15.0 g/dL Final  . HCT 03/29/2017 32.9* 36.0 - 46.0 % Final  . MCV 03/29/2017 77.8* 78.0 - 100.0 fL Final  . MCH 03/29/2017 25.8* 26.0 - 34.0 pg Final  . MCHC 03/29/2017 33.1  30.0 - 36.0 g/dL Final  . RDW 03/29/2017 14.9  11.5 - 15.5 % Final  . Platelets 03/29/2017 210  150 - 400 K/uL Final    No results found.   Assessment/Plan   ICD-10-CM   1. Hyperlipidemia LDL goal <130 E78.5   2. Chronic cough R05   3. Essential hypertension I10   4. Other allergic rhinitis J30.89   5. Mild intermittent asthma, unspecified whether complicated M84.13   6. Other acute pulmonary embolism with acute cor pulmonale (HCC) I26.09   7. Venous (peripheral) insufficiency I87.2   8. Lower extremity edema R60.0   9. Need for immunization against influenza Z23 Flu Vaccine QUAD 36+ mos IM   Influenza vaccine given today  Continue current medications as ordered  Will need Xeralto x 1 yr   Samples of xeralto, symbicort, and xyzal given today  Follow up with specialists as scheduled  Follow up in 3 mos for PE, hyperlipidemia, HTN. Fasting labs prior to appt   Carson S. Perlie Gold  [facility] [address] [place], [place] [address] [phone] Cell (Monday-Friday 8 AM - 5 PM) [phone] After 5 PM and follow prompts

## 2017-05-19 DIAGNOSIS — J4540 Moderate persistent asthma, uncomplicated: Secondary | ICD-10-CM | POA: Diagnosis not present

## 2017-05-19 DIAGNOSIS — J3081 Allergic rhinitis due to animal (cat) (dog) hair and dander: Secondary | ICD-10-CM | POA: Diagnosis not present

## 2017-05-19 DIAGNOSIS — J3089 Other allergic rhinitis: Secondary | ICD-10-CM | POA: Diagnosis not present

## 2017-05-19 DIAGNOSIS — J301 Allergic rhinitis due to pollen: Secondary | ICD-10-CM | POA: Diagnosis not present

## 2017-05-26 DIAGNOSIS — J301 Allergic rhinitis due to pollen: Secondary | ICD-10-CM | POA: Diagnosis not present

## 2017-05-26 DIAGNOSIS — J3089 Other allergic rhinitis: Secondary | ICD-10-CM | POA: Diagnosis not present

## 2017-05-26 DIAGNOSIS — J3081 Allergic rhinitis due to animal (cat) (dog) hair and dander: Secondary | ICD-10-CM | POA: Diagnosis not present

## 2017-06-02 ENCOUNTER — Other Ambulatory Visit: Payer: Self-pay | Admitting: Internal Medicine

## 2017-06-06 ENCOUNTER — Other Ambulatory Visit: Payer: Self-pay | Admitting: *Deleted

## 2017-06-06 DIAGNOSIS — R053 Chronic cough: Secondary | ICD-10-CM

## 2017-06-06 DIAGNOSIS — R05 Cough: Secondary | ICD-10-CM

## 2017-06-06 DIAGNOSIS — R059 Cough, unspecified: Secondary | ICD-10-CM

## 2017-06-06 MED ORDER — HYDROCOD POLST-CPM POLST ER 10-8 MG/5ML PO SUER: 5.0000 mL | Freq: Two times a day (BID) | ORAL | 0 refills | Status: DC | PRN

## 2017-06-06 NOTE — Telephone Encounter (Signed)
Patient requested and will pick up. Hand written Rx and Janett Billow signed due to power outage.

## 2017-06-07 DIAGNOSIS — J301 Allergic rhinitis due to pollen: Secondary | ICD-10-CM | POA: Diagnosis not present

## 2017-06-07 DIAGNOSIS — J3089 Other allergic rhinitis: Secondary | ICD-10-CM | POA: Diagnosis not present

## 2017-06-07 DIAGNOSIS — J3081 Allergic rhinitis due to animal (cat) (dog) hair and dander: Secondary | ICD-10-CM | POA: Diagnosis not present

## 2017-06-10 ENCOUNTER — Other Ambulatory Visit: Payer: Self-pay | Admitting: Internal Medicine

## 2017-06-12 NOTE — Telephone Encounter (Signed)
Spoke with patient to confirm dosage and instructions. Patient states she takes 40 mg by mouth once daily, I had patient double check that its once daily vs twice daily.  Confirmed at once daily

## 2017-06-16 DIAGNOSIS — J3081 Allergic rhinitis due to animal (cat) (dog) hair and dander: Secondary | ICD-10-CM | POA: Diagnosis not present

## 2017-06-16 DIAGNOSIS — J301 Allergic rhinitis due to pollen: Secondary | ICD-10-CM | POA: Diagnosis not present

## 2017-06-16 DIAGNOSIS — J3089 Other allergic rhinitis: Secondary | ICD-10-CM | POA: Diagnosis not present

## 2017-06-18 ENCOUNTER — Other Ambulatory Visit: Payer: Self-pay | Admitting: Internal Medicine

## 2017-06-20 DIAGNOSIS — J3089 Other allergic rhinitis: Secondary | ICD-10-CM | POA: Diagnosis not present

## 2017-06-20 DIAGNOSIS — J301 Allergic rhinitis due to pollen: Secondary | ICD-10-CM | POA: Diagnosis not present

## 2017-06-20 DIAGNOSIS — J3081 Allergic rhinitis due to animal (cat) (dog) hair and dander: Secondary | ICD-10-CM | POA: Diagnosis not present

## 2017-06-30 DIAGNOSIS — J3081 Allergic rhinitis due to animal (cat) (dog) hair and dander: Secondary | ICD-10-CM | POA: Diagnosis not present

## 2017-06-30 DIAGNOSIS — J3089 Other allergic rhinitis: Secondary | ICD-10-CM | POA: Diagnosis not present

## 2017-06-30 DIAGNOSIS — J301 Allergic rhinitis due to pollen: Secondary | ICD-10-CM | POA: Diagnosis not present

## 2017-07-07 DIAGNOSIS — J3081 Allergic rhinitis due to animal (cat) (dog) hair and dander: Secondary | ICD-10-CM | POA: Diagnosis not present

## 2017-07-07 DIAGNOSIS — J3089 Other allergic rhinitis: Secondary | ICD-10-CM | POA: Diagnosis not present

## 2017-07-07 DIAGNOSIS — J301 Allergic rhinitis due to pollen: Secondary | ICD-10-CM | POA: Diagnosis not present

## 2017-07-10 DIAGNOSIS — J301 Allergic rhinitis due to pollen: Secondary | ICD-10-CM | POA: Diagnosis not present

## 2017-07-10 DIAGNOSIS — J3081 Allergic rhinitis due to animal (cat) (dog) hair and dander: Secondary | ICD-10-CM | POA: Diagnosis not present

## 2017-07-10 DIAGNOSIS — J3089 Other allergic rhinitis: Secondary | ICD-10-CM | POA: Diagnosis not present

## 2017-07-11 ENCOUNTER — Other Ambulatory Visit: Payer: Self-pay | Admitting: *Deleted

## 2017-07-11 MED ORDER — LOSARTAN POTASSIUM 25 MG PO TABS: 25.0000 mg | ORAL_TABLET | Freq: Every day | ORAL | 1 refills | Status: DC

## 2017-07-11 MED ORDER — LOSARTAN POTASSIUM 50 MG PO TABS: ORAL_TABLET | ORAL | 1 refills | Status: DC

## 2017-07-11 NOTE — Telephone Encounter (Signed)
[facility] 

## 2017-07-28 DIAGNOSIS — J3081 Allergic rhinitis due to animal (cat) (dog) hair and dander: Secondary | ICD-10-CM | POA: Diagnosis not present

## 2017-07-28 DIAGNOSIS — J3089 Other allergic rhinitis: Secondary | ICD-10-CM | POA: Diagnosis not present

## 2017-07-28 DIAGNOSIS — J301 Allergic rhinitis due to pollen: Secondary | ICD-10-CM | POA: Diagnosis not present

## 2017-07-31 ENCOUNTER — Other Ambulatory Visit: Payer: Self-pay | Admitting: *Deleted

## 2017-07-31 MED ORDER — RIVAROXABAN 20 MG PO TABS: 20.0000 mg | ORAL_TABLET | Freq: Every day | ORAL | 1 refills | Status: DC

## 2017-07-31 NOTE — Telephone Encounter (Signed)
[facility] 

## 2017-08-07 ENCOUNTER — Encounter: Payer: Self-pay | Admitting: Podiatry

## 2017-08-07 ENCOUNTER — Ambulatory Visit (INDEPENDENT_AMBULATORY_CARE_PROVIDER_SITE_OTHER): Payer: PPO | Admitting: Podiatry

## 2017-08-07 ENCOUNTER — Ambulatory Visit: Payer: PPO | Admitting: Podiatry

## 2017-08-07 DIAGNOSIS — B351 Tinea unguium: Secondary | ICD-10-CM

## 2017-08-07 DIAGNOSIS — M79676 Pain in unspecified toe(s): Secondary | ICD-10-CM

## 2017-08-07 NOTE — Progress Notes (Signed)
Subjective: 71 y.o. returns the office today for painful, elongated, thickened toenails which she cannot trim herslef. Denies any redness or drainage around the nails. Denies any acute changes since last appointment and no new complaints today. Denies any systemic complaints such as fevers, chills, nausea, vomiting.   PCP: [name], DO  Objective: AAO 3, NAD DP/PT pulses palpable, CRT less than 3 seconds Nails hypertrophic, dystrophic, elongated, brittle, discolored 10. There is tenderness overlying the nails 1-5 bilaterally. There is no surrounding erythema or drainage along the nail sites. No open lesions or pre-ulcerative lesions are identified. No other areas of tenderness bilateral lower extremities. No overlying edema, erythema, increased warmth. No pain with calf compression, swelling, warmth, erythema.  Assessment: Patient presents with symptomatic onychomycosis  Plan: -Treatment options including alternatives, risks, complications were discussed -Nails sharply debrided 10 without complication/bleeding. -Discussed daily foot inspection. If there are any changes, to call the office immediately.  -Follow-up in 3 months or sooner if any problems are to arise. In the meantime, encouraged to call the office with any questions, concerns, changes symptoms.  Celesta Gentile, DPM

## 2017-08-10 ENCOUNTER — Other Ambulatory Visit: Payer: Self-pay | Admitting: *Deleted

## 2017-08-10 DIAGNOSIS — J3089 Other allergic rhinitis: Secondary | ICD-10-CM | POA: Diagnosis not present

## 2017-08-10 DIAGNOSIS — J301 Allergic rhinitis due to pollen: Secondary | ICD-10-CM | POA: Diagnosis not present

## 2017-08-10 DIAGNOSIS — J3081 Allergic rhinitis due to animal (cat) (dog) hair and dander: Secondary | ICD-10-CM | POA: Diagnosis not present

## 2017-08-10 MED ORDER — PANTOPRAZOLE SODIUM 40 MG PO TBEC: 40.0000 mg | DELAYED_RELEASE_TABLET | Freq: Two times a day (BID) | ORAL | 0 refills | Status: DC

## 2017-08-10 NOTE — Telephone Encounter (Signed)
Patient called and stated that she is taking Protonix twice daily not once daily. Stated that when she talked with the medical assistant in October she must have been confused with her Pravastatin.  Confirmed she takes the Pantoprazole twice daily. Patient requested refill. Faxed.

## 2017-08-24 ENCOUNTER — Other Ambulatory Visit: Payer: Self-pay | Admitting: Internal Medicine

## 2017-08-24 DIAGNOSIS — E785 Hyperlipidemia, unspecified: Secondary | ICD-10-CM

## 2017-08-24 DIAGNOSIS — Z79899 Other long term (current) drug therapy: Secondary | ICD-10-CM

## 2017-08-24 DIAGNOSIS — I10 Essential (primary) hypertension: Secondary | ICD-10-CM

## 2017-08-25 ENCOUNTER — Other Ambulatory Visit: Payer: PPO

## 2017-08-25 DIAGNOSIS — J3081 Allergic rhinitis due to animal (cat) (dog) hair and dander: Secondary | ICD-10-CM | POA: Diagnosis not present

## 2017-08-25 DIAGNOSIS — E785 Hyperlipidemia, unspecified: Secondary | ICD-10-CM

## 2017-08-25 DIAGNOSIS — J3089 Other allergic rhinitis: Secondary | ICD-10-CM | POA: Diagnosis not present

## 2017-08-25 DIAGNOSIS — J301 Allergic rhinitis due to pollen: Secondary | ICD-10-CM | POA: Diagnosis not present

## 2017-08-25 DIAGNOSIS — I10 Essential (primary) hypertension: Secondary | ICD-10-CM

## 2017-08-25 DIAGNOSIS — Z79899 Other long term (current) drug therapy: Secondary | ICD-10-CM | POA: Diagnosis not present

## 2017-08-26 LAB — COMPLETE METABOLIC PANEL WITH GFR
AG Ratio: 1.2 (calc) (ref 1.0–2.5)
ALKALINE PHOSPHATASE (APISO): 159 U/L — AB (ref 33–130)
ALT: 14 U/L (ref 6–29)
AST: 21 U/L (ref 10–35)
Albumin: 4 g/dL (ref 3.6–5.1)
BUN: 10 mg/dL (ref 7–25)
CO2: 26 mmol/L (ref 20–32)
CREATININE: 0.8 mg/dL (ref 0.60–0.93)
Calcium: 9.6 mg/dL (ref 8.6–10.4)
Chloride: 107 mmol/L (ref 98–110)
GFR, Est African American: 85 mL/min/{1.73_m2} (ref >=60)
GFR, Est Non African American: 73 mL/min/{1.73_m2} (ref >=60)
GLUCOSE: 96 mg/dL (ref 65–99)
Globulin: 3.3 g/dL (calc) (ref 1.9–3.7)
Potassium: 4 mmol/L (ref 3.5–5.3)
Sodium: 140 mmol/L (ref 135–146)
Total Bilirubin: 0.5 mg/dL (ref 0.2–1.2)
Total Protein: 7.3 g/dL (ref 6.1–8.1)

## 2017-08-26 LAB — CBC WITH DIFFERENTIAL/PLATELET
BASOS PCT: 0.7 %
Basophils Absolute: 41 cells/uL (ref 0–200)
EOS ABS: 212 {cells}/uL (ref 15–500)
Eosinophils Relative: 3.6 %
HCT: 36.8 % (ref 35.0–45.0)
Hemoglobin: 11.8 g/dL (ref 11.7–15.5)
Lymphs Abs: 2508 cells/uL (ref 850–3900)
MCH: 25.3 pg — ABNORMAL LOW (ref 27.0–33.0)
MCHC: 32.1 g/dL (ref 32.0–36.0)
MCV: 79 fL — ABNORMAL LOW (ref 80.0–100.0)
MONOS PCT: 7.3 %
MPV: 11.4 fL (ref 7.5–12.5)
NEUTROS PCT: 45.9 %
Neutro Abs: 2708 cells/uL (ref 1500–7800)
PLATELETS: 278 10*3/uL (ref 140–400)
RBC: 4.66 10*6/uL (ref 3.80–5.10)
RDW: 14.4 % (ref 11.0–15.0)
TOTAL LYMPHOCYTE: 42.5 %
WBC mixed population: 431 cells/uL (ref 200–950)
WBC: 5.9 10*3/uL (ref 3.8–10.8)

## 2017-08-26 LAB — LIPID PANEL
CHOL/HDL RATIO: 2.7 (calc) (ref <5.0)
Cholesterol: 169 mg/dL (ref <200)
HDL: 63 mg/dL (ref >50)
LDL CHOLESTEROL (CALC): 89 mg/dL
NON-HDL CHOLESTEROL (CALC): 106 mg/dL (ref <130)
Triglycerides: 83 mg/dL (ref <150)

## 2017-08-28 ENCOUNTER — Other Ambulatory Visit: Payer: Self-pay | Admitting: *Deleted

## 2017-08-28 DIAGNOSIS — R05 Cough: Secondary | ICD-10-CM

## 2017-08-28 DIAGNOSIS — R059 Cough, unspecified: Secondary | ICD-10-CM

## 2017-08-28 DIAGNOSIS — R053 Chronic cough: Secondary | ICD-10-CM

## 2017-08-28 NOTE — Telephone Encounter (Signed)
Will decline for now, Dr Eulas Post will see tomorrow and address

## 2017-08-28 NOTE — Telephone Encounter (Signed)
Patient is calling wanting a refill on her Tussionex.  Rx pended and sent to [facility] for approval due to Dr. Eulas Post being scheduled off.  [place] Verified LF 06/06/17

## 2017-08-29 ENCOUNTER — Encounter: Payer: Self-pay | Admitting: *Deleted

## 2017-08-29 ENCOUNTER — Encounter: Payer: Self-pay | Admitting: Internal Medicine

## 2017-08-29 ENCOUNTER — Ambulatory Visit (INDEPENDENT_AMBULATORY_CARE_PROVIDER_SITE_OTHER): Payer: PPO | Admitting: Internal Medicine

## 2017-08-29 VITALS — BP 126/78 | HR 90 | Temp 98.1°F | Resp 12 | Ht 63.0 in | Wt 223.0 lb

## 2017-08-29 DIAGNOSIS — I10 Essential (primary) hypertension: Secondary | ICD-10-CM

## 2017-08-29 DIAGNOSIS — R05 Cough: Secondary | ICD-10-CM | POA: Diagnosis not present

## 2017-08-29 DIAGNOSIS — I2609 Other pulmonary embolism with acute cor pulmonale: Secondary | ICD-10-CM | POA: Diagnosis not present

## 2017-08-29 DIAGNOSIS — Z1211 Encounter for screening for malignant neoplasm of colon: Secondary | ICD-10-CM | POA: Diagnosis not present

## 2017-08-29 DIAGNOSIS — J3089 Other allergic rhinitis: Secondary | ICD-10-CM

## 2017-08-29 DIAGNOSIS — E785 Hyperlipidemia, unspecified: Secondary | ICD-10-CM

## 2017-08-29 DIAGNOSIS — J4520 Mild intermittent asthma, uncomplicated: Secondary | ICD-10-CM

## 2017-08-29 DIAGNOSIS — R053 Chronic cough: Secondary | ICD-10-CM

## 2017-08-29 DIAGNOSIS — R059 Cough, unspecified: Secondary | ICD-10-CM

## 2017-08-29 MED ORDER — HYDROCOD POLST-CPM POLST ER 10-8 MG/5ML PO SUER: 5.0000 mL | Freq: Two times a day (BID) | ORAL | 0 refills | Status: DC | PRN

## 2017-08-29 MED ORDER — ZOSTER VAC RECOMB ADJUVANTED 50 MCG/0.5ML IM SUSR: 0.5000 mL | Freq: Once | INTRAMUSCULAR | 1 refills | Status: AC

## 2017-08-29 MED ORDER — LEVOCETIRIZINE DIHYDROCHLORIDE 5 MG PO TABS: 5.0000 mg | ORAL_TABLET | Freq: Every evening | ORAL | 3 refills | Status: AC

## 2017-08-29 MED ORDER — NIFEDIPINE ER OSMOTIC RELEASE 60 MG PO TB24: 60.0000 mg | ORAL_TABLET | Freq: Every day | ORAL | 3 refills | Status: DC

## 2017-08-29 MED ORDER — ZOSTER VAC RECOMB ADJUVANTED 50 MCG/0.5ML IM SUSR: 0.5000 mL | Freq: Once | INTRAMUSCULAR | 1 refills | Status: DC

## 2017-08-29 NOTE — Progress Notes (Signed)
Patient ID: Wendy Figueroa, female   DOB: [birth year], 72 y.o.   MRN: [id number]   Location:  [facility]  Place of Service:  OFFICE  Provider: Arletha Grippe, DO  Patient Care Team: [name], DO as PCP - General (Internal Medicine) Monna Fam, MD as Consulting Physician (Ophthalmology)  Extended Emergency Contact Information Primary Emergency Contact: Otelia Sergeant Address: [address], [place] Phone: [phone] Mobile Phone: [phone] Relation: Spouse  Code Status:  Goals of Care: Advanced Directive information Advanced Directives 08/29/2017  Does Patient Have a Medical Advance Directive? No  Type of Advance Directive -  Does patient want to make changes to medical advance directive? -  Would patient like information on creating a medical advance directive? Yes (MAU/Ambulatory/Procedural Areas - Information given)    Chief Complaint  Patient presents with  . Annual Exam    Yearly check-up, EKG  up to date (03/2017), AWV completed 12/2016 (MMSE 29/30), - fall, -depression   . Health Maintenance    Refused Hep C screening, Patient states mammogram up to date, rx for Shingrix given to patient   . Medication Refill    Renew Tussionex, Xyzal, and procardia     HPI: Patient is a 72 y.o. female seen in today for a comprehensive exam. AWV note from 01/18/17 reviewed.   She c/o decreased energy. No SOB, CP, palpitations. No easy bruising, melenotic stools, BRBPR. She still has annoying cough. She takes prn tussionex.  No f/c. No HA, dizziness.   Hx dysphagia/chronic cough - last upper esophageal sphincter balloon dilation and suspension microdirect laryngoscopy on 04/18/16 by Dr Rowe Clack at [facility]. Swallowing is worsening again and she has intermittent sensation of being strangled. Takes protonix BID for GERD.  She is able to swallow potassium tabs without difficulty.   HTN - BP stable on on losartan 75 mg  daily, procardia XL and lasix  Asthma - no recent flares. stable on HFA prn. She takes spiriva daily, singulair daily and symbicort every other day. No asthma exacerbations/attacks. She also takes xyzal and flonase for allergic rhinitis.  She gets allergy shots weekly. Followed by pulmonary  Hyperlipidemia - stable; takes pravachol. LDL 89. No myalgias  Arthritis - stable. 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 No hemoptysis. No BRBPR/melena. She will complete tx in Aug 2019.  Depression screen [facility] 2/9 08/29/2017 01/18/2017 09/09/2016 01/01/2016 05/29/2015  Decreased Interest 0 0 0 0 0  Down, Depressed, Hopeless 0 0 0 0 0  PHQ - 2 Score 0 0 0 0 0    Fall Risk  08/29/2017 01/18/2017 01/11/2017 09/09/2016 05/06/2016  Falls in the past year? No No No No Yes  Number falls in past yr: - - - - 1  Injury with Fall? - - - - No   MMSE - Mini Mental State Exam 01/18/2017 09/09/2016 05/29/2015  Orientation to time 5 4 5   Orientation to Place 5 5 5   Registration 3 3 3   Attention/ Calculation 5 3 5   Recall 2 2 3   Language- name 2 objects 2 2 2   Language- repeat 1 1 1   Language- follow 3 step command 3 2 3   Language- read & follow direction 1 1 1   Write a sentence 1 1 1   Copy design 1 1 1   Total score 29 25 30      Health Maintenance  Topic Date Due  . MAMMOGRAM  07/01/2017  .  Hepatitis C Screening  08/29/2018 (Originally 1945-12-06)  . TETANUS/TDAP  08/23/2023 (Originally 10/29/1964)  . COLONOSCOPY  06/12/2018  . INFLUENZA VACCINE  Completed  . DEXA SCAN  Completed  . PNA vac Low Risk Adult  Completed    Past Medical History:  Diagnosis Date  . Abnormal Pap smear    Over 4yrs ago  . ALKALINE PHOSPHATASE, ELEVATED 07/01/2009   Qualifier: Diagnosis of  By: [name] MD, [name]   . Allergic rhinitis   . Anemia   . Arthritis   . Asthma   . Cough    occasional  . Cystocele 11/05/08  . Diverticulosis of colon   . DJD (degenerative joint disease)    no per pt  . Elevated alkaline phosphatase  level   . GERD (gastroesophageal reflux disease)   . H/O urinary incontinence 11/06/2008  . H/O varicella   . Hard measles   . Hx of colonic polyps   . Hypercholesterolemia   . Hypertension   . Leg swelling    occasional  . 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 Overweight(278.02)   . Rectocele 11/06/2008   Large  . Sebaceous cyst    on back  . Sinusitis   . Venous insufficiency   . Wheezing    occasional  . Yeast infection     Past Surgical History:  Procedure Laterality Date  . ABDOMINAL HYSTERECTOMY  1988  . ANTERIOR AND POSTERIOR REPAIR  11/05/08  . APOGEE / PERIGEE REPAIR  10/2008   Dr. Octavio Manns  . bladder tack  2011  . BREAST SURGERY  1978   cyst removed from both removed  . COLONOSCOPY    . ESOPHAGOGASTRODUODENOSCOPY      Family History  Problem Relation Age of Onset  . Aneurysm Mother   . Lung cancer Father   . Hypertension Daughter   . Hypertension Daughter   . Colon cancer Neg Hx   . Esophageal cancer Neg Hx   . Stomach cancer Neg Hx   . Rectal cancer Neg Hx   . Colon polyps Neg Hx    Family Status  Relation Name Status  . Mother  Deceased at age 63       stroke  . Father  Deceased at age 5       cancer  . Daughter Heath Lark  . Daughter [employer]  . Neg Hx  (Not Specified)    Social History   Socioeconomic History  . Marital status: Married    Spouse name: [name]  . Number of children: 2  . Years of education: Not on file  . Highest education level: Not on file  Social Needs  . Financial resource strain: Not on file  . Food insecurity - worry: Not on file  . Food insecurity - inability: Not on file  . Transportation needs - medical: Not on file  . Transportation needs - non-medical: Not on file  Occupational History  . Occupation: LPN   Tobacco Use  . Smoking status: Never Smoker  . Smokeless tobacco: Never Used  Substance and Sexual Activity  . Alcohol use: Yes    Alcohol/week: 1.2 oz    Types: 2 Cans of  beer per week    Comment: 2 beers on the weekend  . Drug use: No  . Sexual activity: Not Currently    Partners: Male    Birth control/protection: None  Other Topics Concern  . Not on file  Social History Narrative   Married  Never smoked   Alcohol few beers on week-end   Exercise walk 5 days a week for one hour   No Advance Directive     Allergies  Allergen Reactions  . Demerol Hives    All over the body   . Lisinopril-Hydrochlorothiazide Hives    All over the body  . Penicillins Hives    Has patient had a PCN reaction causing immediate rash, facial/tongue/throat swelling, SOB or lightheadedness with hypotension: No Has patient had a PCN reaction causing severe rash involving mucus membranes or skin necrosis: No Has patient had a PCN reaction that required hospitalization: No Has patient had a PCN reaction occurring within the last 10 years: No If all of the above answers are "NO", then may proceed with Cephalosporin use.   All over the body    Allergies as of 08/29/2017      Reactions   Demerol Hives   All over the body    Lisinopril-hydrochlorothiazide Hives   All over the body   Penicillins Hives   Has patient had a PCN reaction causing immediate rash, facial/tongue/throat swelling, SOB or lightheadedness with hypotension: No Has patient had a PCN reaction causing severe rash involving mucus membranes or skin necrosis: No Has patient had a PCN reaction that required hospitalization: No Has patient had a PCN reaction occurring within the last 10 years: No If all of the above answers are "NO", then may proceed with Cephalosporin use. All over the body      Medication List        Accurate as of 08/29/17 10:10 AM. Always use your most recent med list.          albuterol 108 (90 Base) MCG/ACT inhaler Commonly known as:  PROVENTIL HFA;VENTOLIN HFA Inhale 2 puffs into the lungs every 6 (six) hours as needed for wheezing or shortness of breath.     budesonide-formoterol 160-4.5 MCG/ACT inhaler Commonly known as:  SYMBICORT Inhale 2 puffs into the lungs 2 (two) times daily.   CALCIUM 600 + D PO Take by mouth. Take one tablet daily   cetirizine 10 MG tablet Commonly known as:  ZYRTEC Take 10 mg by mouth daily.   chlorpheniramine-HYDROcodone 10-8 MG/5ML Suer Commonly known as:  TUSSIONEX PENNKINETIC ER Take 5 mLs by mouth every 12 (twelve) hours as needed for cough.   FISH OIL PO Take by mouth daily.   fluticasone 50 MCG/ACT nasal spray Commonly known as:  FLONASE 2 sprays daily.   furosemide 40 MG tablet Commonly known as:  LASIX TAKE 1 TABLET (40 MG TOTAL) BY MOUTH DAILY.   KLOR-CON M20 20 MEQ tablet Generic drug:  potassium chloride SA TAKE 2 TABLETS EVERY DAY FOR POTASSIUM SUPPLEMENT   levocetirizine 5 MG tablet Commonly known as:  XYZAL Take 5 mg by mouth every evening.   losartan 25 MG tablet Commonly known as:  COZAAR Take 1 tablet (25 mg total) by mouth daily. Take with 50 mg tablet for a total of 75 mg daily.   losartan 50 MG tablet Commonly known as:  COZAAR Take one tablet by mouth once daily along with 25mg  for a total of 75mg  daily   MACULAR VITAMIN BENEFIT PO Take 1 capsule by mouth 2 (two) times daily.   montelukast 10 MG tablet Commonly known as:  SINGULAIR Take 10 mg by mouth at bedtime.   NIFEdipine 60 MG 24 hr tablet Commonly known as:  PROCARDIA XL/ADALAT-CC TAKE 1 TABLET BY MOUTH EVERY DAY   NON FORMULARY  Allergy shots once every other week   pantoprazole 40 MG tablet Commonly known as:  PROTONIX Take 1 tablet (40 mg total) by mouth 2 (two) times daily. For stomach   pravastatin 40 MG tablet Commonly known as:  PRAVACHOL TAKE 1 TABLET EVERY DAY   psyllium 58.6 % packet Commonly known as:  METAMUCIL Take 1 packet by mouth daily as needed (constipation).   ranitidine 300 MG tablet Commonly known as:  ZANTAC Take 300 mg by mouth at bedtime.   rivaroxaban 20 MG Tabs  tablet Commonly known as:  XARELTO Take 1 tablet (20 mg total) by mouth daily.   SPIRIVA HANDIHALER 18 MCG inhalation capsule Generic drug:  tiotropium 18 mcg 2 (two) times daily.   vitamin B-12 1000 MCG tablet Commonly known as:  CYANOCOBALAMIN Take 1,000 mcg by mouth daily.   Zoster Vaccine Adjuvanted injection Commonly known as:  SHINGRIX Inject 0.5 mLs into the muscle once for 1 dose.        Review of Systems:  Review of Systems  HENT: Positive for trouble swallowing.   Respiratory: Positive for cough.   Musculoskeletal: Positive for arthralgias.  All other systems reviewed and are negative.   Physical Exam: Vitals:   08/29/17 0938  BP: 126/78  Pulse: 90  Resp: 12  Temp: 98.1 F (36.7 C)  TempSrc: Oral  SpO2: 97%  Weight: 223 lb (101.2 kg)  Height: 5\' 3"  (1.6 m)   Body mass index is 39.5 kg/m. Physical Exam  Constitutional: She is oriented to person, place, and time. She appears well-developed and well-nourished. No distress.  HENT:  Head: Normocephalic and atraumatic.  Right Ear: External ear normal.  Left Ear: External ear normal.  Mouth/Throat: Oropharynx is clear and moist. No oropharyngeal exudate.  MMM; no oral thrush  Eyes: EOM are normal. Pupils are equal, round, and reactive to light. No scleral icterus.  Neck: Normal range of motion. Neck supple. Carotid bruit is not present. No tracheal deviation present. No thyromegaly present.  Cardiovascular: Normal rate, regular rhythm and intact distal pulses. Exam reveals no gallop and no friction rub.  Murmur (1/6 SEM) heard. No LE edema b/l. No calf TTP  Pulmonary/Chest: Effort normal and breath sounds normal. No respiratory distress. She has no wheezes. She has no rales. She exhibits no tenderness.  No rhonchi  Abdominal: Soft. Bowel sounds are normal. She exhibits no distension and no mass. There is no hepatosplenomegaly or hepatomegaly. There is no tenderness. There is no rebound and no guarding. No  hernia.  obese  Musculoskeletal: She exhibits edema (small and large joint). She exhibits no deformity.  Lymphadenopathy:    She has no cervical adenopathy.  Neurological: She is alert and oriented to person, place, and time. She has normal reflexes.  Skin: Skin is warm and dry. No rash noted.  Multiple nevi; SKs; furuncles on back no signs of secondary infection  Psychiatric: She has a normal mood and affect. Her behavior is normal. Judgment normal.  Vitals reviewed.   Labs reviewed:  Basic Metabolic Panel: Recent Labs    09/09/16 1126  03/27/17 0339 03/28/17 0339 08/25/17 0856  NA 140   < > 139 142 140  K 4.6   < > 4.0 3.8 4.0  CL 104   < > 110 115* 107  CO2 27   < > 20* 21* 26  GLUCOSE 95   < > 123* 105* 96  BUN 9   < > 13 13 10   CREATININE 0.82   < >  0.85 0.72 0.80  CALCIUM 9.9   < > 8.6* 8.8* 9.6  TSH 1.42  --   --   --   --    < > = values in this interval not displayed.   Liver Function Tests: Recent Labs    09/09/16 1126 01/18/17 0835 08/25/17 0856  AST 21 20 21   ALT 14 14 14   ALKPHOS 173* 167*  --   BILITOT 0.5 0.5 0.5  PROT 7.7 7.3 7.3  ALBUMIN 4.2 4.1  --    No results for input(s): LIPASE, AMYLASE in the last 8760 hours. No results for input(s): AMMONIA in the last 8760 hours. CBC: Recent Labs    09/09/16 1126  03/28/17 0339 03/29/17 0516 08/25/17 0856  WBC 5.7   < > 7.9 6.7 5.9  NEUTROABS 2,736  --   --   --  2,708  HGB 12.4   < > 10.7* 10.9* 11.8  HCT 38.9   < > 31.9* 32.9* 36.8  MCV 81.0   < > 79.8 77.8* 79.0*  PLT 285   < > 185 210 278   < > = values in this interval not displayed.   Lipid Panel: Recent Labs    09/09/16 1126 01/18/17 0835 08/25/17 0856  CHOL 168 175 169  HDL 61 65 63  LDLCALC 90 95  --   TRIG 83 76 83  CHOLHDL 2.8 2.7 2.7   Lab Results  Component Value Date   HGBA1C 5.6 05/26/2014    Procedures: No results found.  Assessment/Plan   ICD-10-CM   1. Chronic cough R05   2. Cough R05   3. Essential  hypertension I10 NIFEdipine (PROCARDIA XL/ADALAT-CC) 60 MG 24 hr tablet  4. Other acute pulmonary embolism with acute cor pulmonale (HCC) I26.09   5. Mild intermittent asthma, unspecified whether complicated B04.88   6. Hyperlipidemia LDL goal <130 E78.5   7. Other allergic rhinitis J30.89 levocetirizine (XYZAL) 5 MG tablet  8. Colon cancer screening Z12.11 Fecal Globin By [employer],Medicare     If Dr Rowe Clack has no closer associate to [facility], recommend 2nd opinion  Recommend you contact GI Dr Rowe Clack to see if they have a satellite office near [place].  Follow up with GI to discuss reflux concerns/strangling sensation  Continue current medications as ordered  shingrix vaccine script given to take to local pharmacy  Continue current medications as ordered  Follow up with pulmonary as scheduled  Please return stool sample/iFoB in 1-2 weeks for colon cancer screening  Follow up in 4 mos for HTN, chronic cough, PE , asthma/allergic rhinitis. Fasting labs prior to appt  Keeping You Healthy handout given  Cordella Register. Perlie Gold  [facility] [address] [place], [place] [address] [phone] Cell (Monday-Friday 8 AM - 5 PM) [phone] After 5 PM and follow prompts

## 2017-08-29 NOTE — Patient Instructions (Addendum)
Recommend you contact GI Dr Rowe Clack to see if they have a satellite office near [place].  Follow up with GI to discuss reflux concerns/strangling sensation  Continue current medications as ordered  shingrix vaccine script given to take to local pharmacy  Continue current medications as ordered  Follow up with pulmonary as scheduled  Please return stool sample/iFoB in 1-2 weeks for colon cancer screening  Follow up in 4 mos for HTN, chronic cough, PE , asthma/allergic rhinitis. Fasting labs prior to appt  Keeping You Healthy  Get These Tests  Blood Pressure- Have your blood pressure checked by your healthcare provider at least once a year.  Normal blood pressure is 120/80.  Weight- Have your body mass index (BMI) calculated to screen for obesity.  BMI is a measure of body fat based on height and weight.  You can calculate your own BMI at [employer].it  Cholesterol- Have your cholesterol checked every year.  Diabetes- Have your blood sugar checked every year if you have high blood pressure, high cholesterol, a family history of diabetes or if you are overweight.  Pap Test - Have a pap test every 1 to 5 years if you have been sexually active.  If you are older than 65 and recent pap tests have been normal you may not need additional pap tests.  In addition, if you have had a hysterectomy  for benign disease additional pap tests are not necessary.  Mammogram-Yearly mammograms are essential for early detection of breast cancer  Screening for Colon Cancer- Colonoscopy starting at age 23. Screening may begin sooner depending on your family history and other health conditions.  Follow up colonoscopy as directed by your Gastroenterologist.  Screening for Osteoporosis- Screening begins at age 36 with bone density scanning, sooner if you are at higher risk for developing Osteoporosis.  Get these medicines  Calcium with Vitamin D- Your body requires 1200-1500 mg of Calcium a  day and [phone] IU of Vitamin D a day.  You can only absorb 500 mg of Calcium at a time therefore Calcium must be taken in 2 or 3 separate doses throughout the day.  Hormones- Hormone therapy has been associated with increased risk for certain cancers and heart disease.  Talk to your healthcare provider about if you need relief from menopausal symptoms.  Aspirin- Ask your healthcare provider about taking Aspirin to prevent Heart Disease and Stroke.  Get these Immuniztions  Flu shot- Every fall  Pneumonia shot- Once after the age of 72; if you are younger ask your healthcare provider if you need a pneumonia shot.  Tetanus- Every ten years.  Zostavax- Once after the age of 64 to prevent shingles.  Take these steps  Don't smoke- Your healthcare provider can help you quit. For tips on how to quit, ask your healthcare provider or go to www.smokefree.gov or call 1-800 QUIT-NOW.  Be physically active- Exercise 5 days a week for a minimum of 30 minutes.  If you are not already physically active, start slow and gradually work up to 30 minutes of moderate physical activity.  Try walking, dancing, bike riding, swimming, etc.  Eat a healthy diet- Eat a variety of healthy foods such as fruits, vegetables, whole grains, low fat milk, low fat cheeses, yogurt, lean meats, chicken, fish, eggs, dried beans, tofu, etc.  For more information go to www.thenutritionsource.org  Dental visit- Brush and floss teeth twice daily; visit your dentist twice a year.  Eye exam- Visit your Optometrist or Ophthalmologist yearly.  Drink  alcohol in moderation- Limit alcohol intake to one drink or less a day.  Never drink and drive.  Depression- Your emotional health is as important as your physical health.  If you're feeling down or losing interest in things you normally enjoy, please talk to your healthcare provider.  Seat Belts- can save your life; always wear one  Smoke/Carbon Monoxide detectors- These detectors  need to be installed on the appropriate level of your home.  Replace batteries at least once a year.  Violence- If anyone is threatening or hurting you, please tell your healthcare provider.  Living Will/ Health care power of attorney- Discuss with your healthcare provider and family.

## 2017-08-29 NOTE — Addendum Note (Signed)
Addended by: [name] on: 08/29/2017 09:04 AM   Modules accepted: Orders

## 2017-08-30 ENCOUNTER — Telehealth: Payer: Self-pay | Admitting: *Deleted

## 2017-08-30 DIAGNOSIS — K219 Gastro-esophageal reflux disease without esophagitis: Secondary | ICD-10-CM

## 2017-08-30 DIAGNOSIS — R05 Cough: Secondary | ICD-10-CM

## 2017-08-30 DIAGNOSIS — R059 Cough, unspecified: Secondary | ICD-10-CM

## 2017-08-30 DIAGNOSIS — R1310 Dysphagia, unspecified: Secondary | ICD-10-CM

## 2017-08-30 NOTE — Telephone Encounter (Signed)
Patient called and stated that she contacted Dr. Hazle Coca office and there is not a satellite office near [place]. Patient is requesting a referral to another GI that you would recommend for her reflux. Please Advise.

## 2017-08-31 NOTE — Telephone Encounter (Signed)
done

## 2017-09-01 ENCOUNTER — Telehealth: Payer: Self-pay | Admitting: Gastroenterology

## 2017-09-01 ENCOUNTER — Other Ambulatory Visit: Payer: Self-pay

## 2017-09-01 DIAGNOSIS — Z1211 Encounter for screening for malignant neoplasm of colon: Secondary | ICD-10-CM

## 2017-09-01 NOTE — Telephone Encounter (Signed)
Received referral from PCP for patient to see Dr. Loletha Carrow.  Dr. Loletha Carrow referred patient to [facility] in 2017 for her symptoms.  I spoke with patient and patient wants to follow up with Dr. Loletha Carrow because she does not want to continue driving to [facility].  Please advice scheduling?

## 2017-09-01 NOTE — Telephone Encounter (Signed)
I do not think the ENT group here in [place] does the same procedures at those at [facility].  This is complicated ENT issue.  My advice is that if she wants to transfer ENT care from [facility] to [place], she should have her [facility] ENT doc communicate with [facility] and see if they offer same services and then discuss her case.  That would provide the best continuity of care.

## 2017-09-01 NOTE — Telephone Encounter (Signed)
If this is regarding chronic cough, in actuality I referred her back to her ENT at [facility] for management because it is not reflux-related. (see my note for details).  As such, I do not feel I have anything to offer for that.  See that note regarding the fecal incontinence as well.  I am sorry if that is disappointing, but I just do not want her to be unsatisfied if my answer is the same as before.  If it is for a new GI issue, then I will see her.

## 2017-09-01 NOTE — Telephone Encounter (Signed)
Called patient back, she is not home, will call her back later today.

## 2017-09-01 NOTE — Telephone Encounter (Signed)
Routed to Dr. Danis. 

## 2017-09-01 NOTE — Telephone Encounter (Signed)
Left detailed message with suggestion to have patient contact [facility] to get referral here in [place] to see if this group does same procedure. [facility] will need to make referral so as to communicate for best continuity of care.

## 2017-09-01 NOTE — Telephone Encounter (Signed)
Spoke to patient she states it is still for the chronic cough, states she does get "strangled" sometimes related to the cough. She did see the doctor at [facility] for this and did a dilation which helped the first time and but not the next time. She said that they were going to do something different but she just cannot continue to go to [facility] and would like a referral to an ENT here in this area, if that is your recommendation.

## 2017-09-08 DIAGNOSIS — J3081 Allergic rhinitis due to animal (cat) (dog) hair and dander: Secondary | ICD-10-CM | POA: Diagnosis not present

## 2017-09-08 DIAGNOSIS — J3089 Other allergic rhinitis: Secondary | ICD-10-CM | POA: Diagnosis not present

## 2017-09-08 DIAGNOSIS — J301 Allergic rhinitis due to pollen: Secondary | ICD-10-CM | POA: Diagnosis not present

## 2017-09-11 ENCOUNTER — Other Ambulatory Visit: Payer: Self-pay | Admitting: *Deleted

## 2017-09-11 MED ORDER — FUROSEMIDE 40 MG PO TABS: ORAL_TABLET | ORAL | 1 refills | Status: DC

## 2017-09-11 NOTE — Telephone Encounter (Signed)
[facility] 

## 2017-09-15 ENCOUNTER — Telehealth: Payer: Self-pay | Admitting: *Deleted

## 2017-09-15 ENCOUNTER — Other Ambulatory Visit: Payer: Self-pay | Admitting: *Deleted

## 2017-09-15 DIAGNOSIS — R05 Cough: Secondary | ICD-10-CM

## 2017-09-15 DIAGNOSIS — R053 Chronic cough: Secondary | ICD-10-CM

## 2017-09-15 DIAGNOSIS — Z1211 Encounter for screening for malignant neoplasm of colon: Secondary | ICD-10-CM | POA: Diagnosis not present

## 2017-09-15 NOTE — Telephone Encounter (Signed)
Patient called and stated that she has spoken with Dr. Corena Pilgrim Nurse and she stated that Dr. Loletha Carrow suggests patient to go to ENT. Stated that he doesn't think she has a reflux problem. Patient is requesting a ENT referral. Please Advise.

## 2017-09-15 NOTE — Telephone Encounter (Signed)
Patient notified and agreed. Referral placed. 

## 2017-09-15 NOTE — Telephone Encounter (Signed)
Ok to refer to ENT for chronic cough due to LP reflux

## 2017-09-16 LAB — FECAL GLOBIN BY IMMUNOCHEM,MEDICARE
FECAL GLOBIN RESULT: NOT DETECTED
MICRO NUMBER: 90110283
SPECIMEN QUALITY:: ADEQUATE

## 2017-09-22 DIAGNOSIS — J3089 Other allergic rhinitis: Secondary | ICD-10-CM | POA: Diagnosis not present

## 2017-09-22 DIAGNOSIS — J301 Allergic rhinitis due to pollen: Secondary | ICD-10-CM | POA: Diagnosis not present

## 2017-09-22 DIAGNOSIS — J3081 Allergic rhinitis due to animal (cat) (dog) hair and dander: Secondary | ICD-10-CM | POA: Diagnosis not present

## 2017-09-29 DIAGNOSIS — R1312 Dysphagia, oropharyngeal phase: Secondary | ICD-10-CM | POA: Diagnosis not present

## 2017-09-29 DIAGNOSIS — R05 Cough: Secondary | ICD-10-CM | POA: Diagnosis not present

## 2017-09-29 DIAGNOSIS — K224 Dyskinesia of esophagus: Secondary | ICD-10-CM | POA: Diagnosis not present

## 2017-09-29 DIAGNOSIS — J383 Other diseases of vocal cords: Secondary | ICD-10-CM | POA: Diagnosis not present

## 2017-10-06 DIAGNOSIS — J3089 Other allergic rhinitis: Secondary | ICD-10-CM | POA: Diagnosis not present

## 2017-10-06 DIAGNOSIS — J3081 Allergic rhinitis due to animal (cat) (dog) hair and dander: Secondary | ICD-10-CM | POA: Diagnosis not present

## 2017-10-06 DIAGNOSIS — J301 Allergic rhinitis due to pollen: Secondary | ICD-10-CM | POA: Diagnosis not present

## 2017-10-11 ENCOUNTER — Other Ambulatory Visit (INDEPENDENT_AMBULATORY_CARE_PROVIDER_SITE_OTHER): Payer: PPO

## 2017-10-11 ENCOUNTER — Ambulatory Visit (INDEPENDENT_AMBULATORY_CARE_PROVIDER_SITE_OTHER)
Admission: RE | Admit: 2017-10-11 | Discharge: 2017-10-11 | Disposition: A | Discharge status: Home / Self Care | Payer: PPO | Source: Ambulatory Visit | Attending: Pulmonary Disease | Admitting: Pulmonary Disease

## 2017-10-11 ENCOUNTER — Ambulatory Visit: Payer: PPO | Admitting: Pulmonary Disease

## 2017-10-11 ENCOUNTER — Encounter: Payer: Self-pay | Admitting: Pulmonary Disease

## 2017-10-11 VITALS — BP 140/90 | HR 106 | Temp 98.1°F | Ht 63.0 in | Wt 225.0 lb

## 2017-10-11 DIAGNOSIS — K219 Gastro-esophageal reflux disease without esophagitis: Secondary | ICD-10-CM

## 2017-10-11 DIAGNOSIS — I10 Essential (primary) hypertension: Secondary | ICD-10-CM

## 2017-10-11 DIAGNOSIS — J301 Allergic rhinitis due to pollen: Secondary | ICD-10-CM | POA: Diagnosis not present

## 2017-10-11 DIAGNOSIS — R05 Cough: Secondary | ICD-10-CM

## 2017-10-11 DIAGNOSIS — R490 Dysphonia: Secondary | ICD-10-CM

## 2017-10-11 DIAGNOSIS — E663 Overweight: Secondary | ICD-10-CM

## 2017-10-11 DIAGNOSIS — R059 Cough, unspecified: Secondary | ICD-10-CM

## 2017-10-11 DIAGNOSIS — J4530 Mild persistent asthma, uncomplicated: Secondary | ICD-10-CM

## 2017-10-11 LAB — COMPREHENSIVE METABOLIC PANEL
ALBUMIN: 4.2 g/dL (ref 3.5–5.2)
ALK PHOS: 156 U/L — AB (ref 39–117)
ALT: 15 U/L (ref 0–35)
AST: 20 U/L (ref 0–37)
BILIRUBIN TOTAL: 0.5 mg/dL (ref 0.2–1.2)
BUN: 13 mg/dL (ref 6–23)
CALCIUM: 10.1 mg/dL (ref 8.4–10.5)
CHLORIDE: 103 meq/L (ref 96–112)
CO2: 28 mEq/L (ref 19–32)
CREATININE: 0.95 mg/dL (ref 0.40–1.20)
GFR: 73.96 mL/min (ref >60.00)
Glucose, Bld: 124 mg/dL — ABNORMAL HIGH (ref 70–99)
Potassium: 3.8 mEq/L (ref 3.5–5.1)
Sodium: 138 mEq/L (ref 135–145)
Total Protein: 8.3 g/dL (ref 6.0–8.3)

## 2017-10-11 LAB — CBC WITH DIFFERENTIAL/PLATELET
Basophils Absolute: 0.1 10*3/uL (ref 0.0–0.1)
Basophils Relative: 1 % (ref 0.0–3.0)
EOS PCT: 2.6 % (ref 0.0–5.0)
Eosinophils Absolute: 0.2 10*3/uL (ref 0.0–0.7)
HCT: 37.6 % (ref 36.0–46.0)
Hemoglobin: 12.2 g/dL (ref 12.0–15.0)
Lymphocytes Relative: 36.9 % (ref 12.0–46.0)
Lymphs Abs: 2.3 10*3/uL (ref 0.7–4.0)
MCHC: 32.5 g/dL (ref 30.0–36.0)
MCV: 81.6 fl (ref 78.0–100.0)
MONOS PCT: 6.9 % (ref 3.0–12.0)
Monocytes Absolute: 0.4 10*3/uL (ref 0.1–1.0)
NEUTROS PCT: 52.6 % (ref 43.0–77.0)
Neutro Abs: 3.2 10*3/uL (ref 1.4–7.7)
Platelets: 289 10*3/uL (ref 150.0–400.0)
RBC: 4.61 Mil/uL (ref 3.87–5.11)
RDW: 15.2 % (ref 11.5–15.5)
WBC: 6.1 10*3/uL (ref 4.0–10.5)

## 2017-10-11 LAB — BRAIN NATRIURETIC PEPTIDE: Pro B Natriuretic peptide (BNP): 19 pg/mL (ref 0.0–100.0)

## 2017-10-11 LAB — TSH: TSH: 1.3 u[IU]/mL (ref 0.35–4.50)

## 2017-10-11 NOTE — Progress Notes (Signed)
Subjective:     Patient ID: Wendy Figueroa, female   DOB: [birth year], 72 y.o.   MRN: [id number]  HPI 72 y/o BF here for a yearly follow up and med refills... he has multiple medical problems as noted below... She is an LPN... ~  SEE PREV EPIC NOTES FOR OLDER DATA >>   ~  August 28, 2012:  68moROV & Maximina reports a good interval w/o new complaints or concerns...     AR> on Xyzal5, Singulair10, Omnaris, & allergy [place] & his note is reviewed...    Asthma> on Symbicort160, NEBS w/ Albut, & Tussionex; she denies recent asthma exac, breathing well w/o wheezing, cough, SOB, etc...    HBP> on ProcXL60, Losar50, Lasix40, K20; BP= 140/70 & she denies CP, palpit, dizzy, ch in SOB/DOE, edema...    Chol> on Prav40 + FishOil; FLP shows TChol 162, TG 35, HDL 59, LDL 96    Overweight> wt down 5# to 215# today; we reviewed diet, exercise, wt reduction strategies...    GI> GERD, Divertics, Polyps, Elev AlkPhos> on Zegerid & followed by DrDBrodie; last colonoscopy was 7/06 & follow up planned 121yr    DJD> on Mobic15 as needed; she has been seen by DrGioffre in the past w/ shot in the left knee which helped... 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    Asthma> on Symbicort160, NEBS w/ Albut, & Tussionex; she denies recent asthma exac, breathing well w/o wheezing, cough, SOB, etc; requests refill Tussionex...    HBP> on ProcXL60, Losar50, Lasix40, K20; BP= 124/84 & she denies CP, palpit, dizzy, ch in SOB/DOE, edema...    Chol> on Prav40 +  FishOil; FLP 2/15 shows TChol 151, TG 71, HDL 55, LDL 82    Overweight> wt up 2# to 217# today (BMI=37); we reviewed diet, exercise, wt reduction strategies...    GI> GERD, Divertics, Polyps, Elev AlkPhos> on Zegerid & followed by DrDBrodie; last colonoscopy was 7/06 & follow up planned 10y74yr  DJD> on Mobic15 as needed; she has been seen by DrGioffre in the past w/ shot in the left knee which helped; now c/o right knee pain, we will Rx MOBIC15 7 refer to [facility] for XRays & eval... We reviewed prob list, meds, xrays and labs> see below for updates >> She had the 2014 flu vaccine in Oct2014... Requests refill tussionex...  LABS 2/15:  FLP- at goals on Prav40;  Chems- wnl x sl elev AlkPhos=145;  CBC- ok w/ Hg=12.6, MCV=83;  TSH=1.41...   ~  Jan 08, 2014:  3-81mo 481moand LenorAsiana/o 18mo h56moery bad allergies" despite her meds: Xyzal5Vertell Novakrgy shots, Singulair10, Symbicort160, ProairHFA, and Tussionex;  She called DrSharma "but he would not refill my Tussionex and I need it" she says;  She notes cough, congestion, min sput, no hemoptysis, sl SOB/DOE w/o change, and denies CP/ palpit/ edema;  She doesn't like Pred rx...  Exam w/  sl decr BS bilat but no wheezing;  She denies reflux symptoms on her Zegerid rx;  CXR is clear, and PF is superphysiologic but the tracing is inaccurate;  We decided to treat w/ Depo80, Medrol dosepak, and continue current regimen as outlined...     We reviewed prob list, meds, xrays and labs> see below for updates >> She has arranged for [facility]Pandey [facility]...   CXR 5/15 showed normal heart size, clear lungs, NAD...   PFT 5/15 showed FVC=2.94 (120%), FEV1=2.53 (135%), %1sec=86, mid-flows=219% predicted, but her tracing shows a hitch & the numbers are not believed to be accurate despite mult attempts...  ~  December 16, 2014:  34moROV & LRadhareports doing reasonably well- she still follows w/ DrSharma Q634mo he fills most of her  meds;  States her breathing is pretty good, notes some cough & occas "spasms", SOB, wheezing;  [facility] w/ problems as listed below...     AR> on XyStrumSingulair10, Flonases, & allergy shots from DrPiney Greenseen Q6m52mot we do not have recent notes to review...    Asthma> on Symbicort160, Spiriva daily, ProairHFA as needed, & Tussionex; she denies recent asthma exac, breathing well w/o wheezing, cough, SOB, etc; requests refill Tussionex... We reviewed prob list, meds, xrays and labs> see below for updates >>    ~  October 11, 2017:  19yr52yr & LenoAlainaurns having been re-consulted per [facility]- Drs MarcBlenda Nicelyater due to her chronic cough... There is a lot to get caught up on over the last 3 yrs>> We reviewed the interval Epic records as follows>     When I last saw the pt in 2016>  We were treating her long-standing chronic irritative cough w/ prn Tussionex;  She has chronic allergies followed by DrSharma on immunotherapy x yrs, plus Xyzal, Singulair10, & Flonase; and Asthma controlled on triple therapy w/ Symbicort160-2spBid & Spiriva once daily, with a rescue inhaler as needed...     She was followed by ENT- [facility], DrMadden & last seen 10/11/16> followed for LPRD (hx HH & GERD), pharyngoesoph dysphagia (w/ prom cricopharyngeal bar), dysphonia (primary muscle tension), globus sensation & cough; they did extensive work-up & tried mult therapies including esoph dilatation, botox injections, Neurontin rx; they did repeat MBS- and offered UES dilatation, Botox injection, & poss CP myotomy & she opted for the direct laryngoscopy, esoph dilatation, and cricopharyngeal botox injection- this was done on 11/21/16; she reported improved after this treatment & they rec continued antireflux regimen & Neurontin 100=>300 Qhs...     She was HOSP 8/5 - 03/29/17 by [facility] after presenting w/ sudden dyspnea- CXR was clear & she was hypoxemic, subseq CTA  revealed (submassive) bilat pulm emboli; 2DEcho showed EF=50-55% w/ RV dil & decr RVF; VenDopplers showed a superficial thrombosis of R-lesser saphenous vein, hypercoag panel was NEG; she was treated w/ heparin=> Xarelto...    She [facility] improved overall, noted chr leg swelling 7 wearing TED hose,     She continued to f/u w/ Allergy-DrSharma 05/19/17> AR & Asthma- felt to be stable, no changes made & no mention of her cough...     In Jan 2019 she had f/u w/ PCP> c/o recurrent dysphagia & cough, stated swallowing worse despite Protonix40Bid=> rec f/u w/ ENT & Pulm...    [facility] at [facility]  on 09/[facility]> notes her long hx chronic cough, perrenial allergies, controlled asthma, GERD symptoms/ HH/ interm hoarseness, & dysphagia; they reviewed extensive records, performed flex laryngoscopy (essent neg), and concluded that the best approach was to proceed w/ cough suppressive therapy thru their speech & language pathology dept, while continuing an antireflux regimen... (NOTE: pt declined to ret to [facility] for the needed SLP cough suppression therapy)...   We reviewed the following medical problems during today's office visit>     ENT evaluation> [facility]- DrMadden, now DrMarcellino, and notes reviewed- hx LPRD (hx HH & GERD), pharyngoesoph dysphagia (w/ prom cricopharyngeal bar), dysphonia (primary muscle tension), globus sensation & cough; they did extensive work-up & tried mult therapies including esoph dilatation, botox injections, Neurontin rx...     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     AR> on [place], Singulair10, Flonase, & allergy shots from [place] who continues to monitor the patient regularly...    Asthma> on  Symbicort160-2spBid, Spiriva once daily, NEBS w/ Albut- prn;; she denies recent asthma exac, breathing well w/o wheezing, cough, SOB, etc...    Chronic cough> despite mult extensive evaluations in past- she has a persistent chr cough that requires Tussionex to suppress- she uses lozenges, cough drops, etc as well... She was prev easily managed w/ Tussionex prn-- we discussed re-checking CXR, Spirometry, Ambulatory Oximetry, & 2DEcho...    GI> GERD, Divertics, Polyps, Elev AlkPhos> on Protonix40Bid, Zantac300Qhs, & prev followed by DrDBrodie and last colonoscopy was 2014 w/ tubular adenoma removed; she was seen 01/2016 by DrDanis- constip w/ some fecal incontinence & rec to incr fiber...     Medical issues> managed by DrMCarter; HBP on Procardia, Losartan, Lasix, KCl;  HL on Pravastatin & Omega=3s; Overweight on diet & exercise program; DJD on OTC analgesics + shots from Ortho; Anemic w/ Hg~11 & mcv~79  EXAM revealed Afeb, VSS, O2sat=98% on RA, Wt=225#, 5'3"Tall, BMI=40;  HEENT- neg, mallampati2;  Chest- decr BS bilat, clear w/o w/r/r;  Heart- RR w/o m/r/g;  Abd- obese, soft, nontender;  Ext- VI, tr-1+ edema w/o c/c;  Neuro- intact w/o focal abn...  CXR 10/11/17 (independently reviewed by me in the PACS system) showed norm heart size, clear lungs x min linear atx left lung base- NAD, kyphosis & DJD in Tspine...   Spirometry 10/11/17> FVC=2.25 (104%), FEV1=2.02 (121%), %1sec=90, mid-flows are superphysiologic at 223% predicted... The flow-vol loop tracing is poor however...  Ambulatory Oximetry 10/11/17>  O2sat=99% on RA at rest w/ pulse=92/min;  She ambulated 3 laps (185'ea) w/ lowest O2sat=98% w/ pulse=136/min...   LABS 10/11/17>  Chems- wnl x BS=124, Cr=0.95, LFTs wnl x AlkPhos=156;  CBC- wnl w/ Hg=12.2, mcv=82, eos=2.6%;  TSH=1.30;  BNP=19  2DEcho done 10/16/17>  Norm LV size & function w/ EF=55-60%, no regional wall motion abn, gr1DD, AoV- ok, mildly thickened leaflets w/o stenosis or regurg, MV- norm  w/o stenosis or regurg, RV cavity size & sys function were WNL, PAsys=70mHg... IMP/PLAN>>  Her CXR, Spirometry, O2sats, Labs, & 2DEcho are all good- 2D is now back to norm after 651mon Xarelto for her PTE;  I agree w/ DrMarcellino that it doesn't make sense to pursue additional diagnostic evaluation at this juncture & I agree w/ the recommendation for cough suppression therapy;  Pt refuses to go back to [facility] for the SLP cough  program & there is nothing like this avail in [place];  I would simply recommend continuing her cough suppression w/ Tussionex or similar cough medication that has worked well for her in the past;  OK to slowly wean her bronchodilator therapy given her PFT results and lack of asthma exacerbations over the last several yrs;  Currently still on Xarelto & DrCarter's notes indicate that she plans to Rx for 68yrthen consider stopping the anticoag therapy... I would be happy to recheck pt in the future if desired...  Note:  >50% of this 685m appt was spent in counseling & coordination of care...            Problem List:  ALLERGIC RHINITIS (ICD-477.9) - followed by DrSharma on shots weekly... also takes FLONASE 2sp in each nostril daily,  SINGULAIR 1081m,  XYZAL 5mg31m.. prev allergy tests + for trees, grass, ragweed, molds, dust, cat>dog, etc...  Hx of SINUSITIS (ICD-473.9) - no recent infections or antibiotics required.  ASTHMA (ICD-493.90) - Former pt of DrSlotnick yrs ago, also eval by DrKoCarsonvilleWFU.[employer] Automotivestable on SYMBICORT 160 2spBid, SPIRIVA daily, & VENTOLIN HFA as needed (DrSharma prescribes these meds)... she has min cough, small amt clear sputum and no recent wheezing, increased dyspnea, chest pain, etc... ~  CXR 11/10 showed norm heart size, ectatic/ calcif ao, clear lungs w/ min peribronch thickening, NAD...  ~  2/15: on Symbicort160, NEBS w/ Albut, & Tussionex; she denies recent asthma exac, breathing well w/o wheezing, cough, SOB, etc; requests refill  Tussionex. ~  5/15: c/o 55mo 61movery bad allergies" despite her meds: Xyzal5, Omnaris, Allergy shots, Singulair10, Symbicort160, ProairHFA, and Tussionex; she notes cough, congestion, min sput, no hemoptysis, sl SOB/DOE w/o change, and denies CP/ palpit/ edema;  Exam w/ sl decr BS bilat but no wheezing;  She denies reflux symptoms on her Zegerid rx;  CXR is clear, and PF is superphysiologic but the tracing is inaccurate;  We decided to treat w/ Depo80, Medrol dosepak, and continue current regimen as outlined...  ~  CXR 5/15 showed normal heart size, clear lungs, NAD...  ~  PFT 5/15 showed FVC=2.94 (120%), FEV1=2.53 (135%), %1sec=86, mid-flows=219% predicted, but her tracing shows a hitch & the numbers are not believed to be accurate despite mult attempts... ~  4/16: on Symbicort160, Spiriva daily, ProairHFA as needed, & Tussionex; she denies recent asthma exac, breathing well w/o wheezing, cough, SOB, etc; requests refill Tussionex.  HYPERTENSION (ICD-401.9) - on PROCARDIA XL 60mg/22mLOSARTAN50, LASIX 40mg/d31mCl 20mEq- 38m.. ~  2/12:  add-on for elev BP at [facility] exerc program> we added LOSARTAN 50mg/d. 60m/13:  BP= 136/80 and she denies HA, fatigue, visual changes, CP, palipit, dizziness, syncope, dyspnea, edema, etc... ~  1/14:  BP= 142/70 & she remains essentially asymptomatic...  ~  2/15: on ProcXL60, Losar50, Lasix40, K20; BP= 124/84 & she denies CP, palpit, dizzy, ch in SOB/DOE, edema. ~  She is followed by the [facility], Lasix40... BP remains under good control.  VENOUS INSUFFICIENCY (ICD-459.81) - she follows low sodium diet, elevates legs, wears support hose, and takes the Lasix...  HYPERCHOLESTEROLEMIA, BORDERLINE (ICD-272.4) - on PRAVASTATIN 40mg/d + 22mfat diet... ~  FLP 5/07 s[place] Parked TChol 190, TG 68, HDL 56, LDL 120 ~  FLP 7/09 showed TChol 202, TG 53, HDL 52, LDL 141... rec> add Prav40. ~  FLP 11/10 [place]Prav40 showed TChol 171, TG 70, HDL 57,  LDL 100 ~  FLP 12/11 on Prav40 showed TChol 150, TG 36, HDL 56, LDL 87 ~  FLP 1/13 on Prav40 showed TChol 162, TG 38, HDL 63, LDL 91 ~  FLP 1/14 on Prav40 showed TChol 162, TG 35, HDL 59, LDL 96 ~  FLP 2/15 on Prav40 showed TChol 151, TG 71, HDL 55, LDL 82  ~  [facility]...  OVERWEIGHT (ICD-278.02) - discussed low carb/ low fat diet and increased exercise program... ~  weight 7/09 is down 5# to 203#... she knows that she needs to do better... ~  weight 11/10 = 212# ~  weight 12/11 = 215# ~  weight 2/12 = 209# ~  Weight 1/13 = 220# & we reviewed low carb, low fat, wt reducing diet! ~  Weight 1/14 = 215#  ~  Weight 2/15 = 217# ~  Weight 4/16 = 216#  GERD (ICD-530.81) - on ZEGERID prn, & off prev reglan rx... EGD 9/02 showed sliding HH, mild gastritis, neg biopsy...  DIVERTICULOSIS OF COLON (ICD-562.10) + COLONIC POLYPS (ICD-211.3) >>  ~  Colonoscopy 7/06 by DrDBrodie showed extensive divertics, spasm, redund colon, hems, and sm polyp (no path avail)... ~  Colonoscopy 10/14 by DrDBrodie showed 2 polyps in desc colon (Path= tub adenoma) & f/u planned 26yr...   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 she never ret for the GGT. ~  labs 11/10 showed AlkPhos= 195 ( 39-117), GGT= 54 (7-51)... continue to follow. ~  labs 12/11 showed AlkPhos= 158 ~  Labs 1/13 showed AlkPhos= 152 ~  Labs 1/14 showed AlkPhos= 146 ~  Labs 2/15 showed AlkPhos= 145  DEGENERATIVE JOINT DISEASE (ICD-715.90) ~  11/10: c/o left knee pain & XRay shows joint effusion- trial MOBIC 128md & refer to Ortho> seen by DrGioffre & given knee injection.  DERM >> she has mult seborrheic keratoses, cherry red spots, and a sebaceous cyst on her back==> referred to [facility]. ~  Onychomycosis w/ periodic nail debridement by [place] -  GYN= DrARoberts... [facility] w/ TScores -0.9 to +1.1; repeat 7/13  by Gyn was still wnl w/ lowest Tscore -0.9 in Spine... [facility])... s/p hyst, and A/P repair by DrRoberts 3/10... she had PNEUMOVAX in 2006 (age58) & repeated 12/11 (age64).   Past Surgical History:  Procedure Laterality Date  . ABDOMINAL HYSTERECTOMY  1988  . ANTERIOR AND POSTERIOR REPAIR  11/05/08  . APOGEE / PERIGEE REPAIR  10/2008   Dr. A.Octavio Manns. bladder tack  2011  . BREAST SURGERY  1978   cyst removed from both removed  . COLONOSCOPY    . ESOPHAGOGASTRODUODENOSCOPY      Outpatient Encounter Medications as of 10/11/2017  Medication Sig  . albuterol (PROVENTIL HFA;VENTOLIN HFA) 108 (90 Base) MCG/ACT inhaler Inhale 2 puffs into the lungs every 6 (six) hours as needed for wheezing or shortness of breath.  . budesonide-formoterol (SYMBICORT) 160-4.5 MCG/ACT inhaler Inhale 2 puffs into the lungs 2 (two) times daily.    . Calcium Carb-Cholecalciferol (CALCIUM 600 + D PO) Take by mouth. Take one tablet daily  . cetirizine (ZYRTEC) 10 MG tablet Take 10 mg by mouth daily.  . chlorpheniramine-HYDROcodone (TUSSIONEX PENNKINETIC ER) 10-8 MG/5ML SUER Take 5 mLs by mouth every 12 (twelve) hours as needed for cough.  . fluticasone (FLONASE) 50 MCG/ACT nasal spray 2 sprays daily.  . furosemide (LASIX) 40 MG  tablet Take one tablet by mouth once daily  . KLOR-CON M20 20 MEQ tablet TAKE 2 TABLETS EVERY DAY FOR POTASSIUM SUPPLEMENT  . levocetirizine (XYZAL) 5 MG tablet Take 1 tablet (5 mg total) by mouth every evening.  Marland Kitchen losartan (COZAAR) 25 MG tablet Take 1 tablet (25 mg total) by mouth daily. Take with 50 mg tablet for a total of 75 mg daily.  Marland Kitchen losartan (COZAAR) 50 MG tablet Take one tablet by mouth once daily along with 4m for a total of 758mdaily  . montelukast (SINGULAIR) 10 MG tablet Take 10 mg by mouth at bedtime.    . Multiple Vitamins-Minerals (MACULAR VITAMIN BENEFIT PO) Take 1 capsule by mouth 2 (two) times daily.    . Marland KitchenIFEdipine (PROCARDIA  XL/ADALAT-CC) 60 MG 24 hr tablet Take 1 tablet (60 mg total) by mouth daily.  . NON FORMULARY Allergy shots once every other week  . Omega-3 Fatty Acids (FISH OIL PO) Take by mouth daily.  . pantoprazole (PROTONIX) 40 MG tablet Take 1 tablet (40 mg total) by mouth 2 (two) times daily. For stomach  . pravastatin (PRAVACHOL) 40 MG tablet TAKE 1 TABLET EVERY DAY  . psyllium (METAMUCIL) 58.6 % packet Take 1 packet by mouth daily as needed (constipation).  . ranitidine (ZANTAC) 300 MG tablet Take 300 mg by mouth at bedtime.  . rivaroxaban (XARELTO) 20 MG TABS tablet Take 1 tablet (20 mg total) by mouth daily.  . Marland KitchenPIRIVA HANDIHALER 18 MCG inhalation capsule 18 mcg 2 (two) times daily.  . vitamin B-12 (CYANOCOBALAMIN) 1000 MCG tablet Take 1,000 mcg by mouth daily.   No facility-administered encounter medications on file as of 10/11/2017.     Allergies  Allergen Reactions  . Demerol Hives    All over the body   . Lisinopril-Hydrochlorothiazide Hives    All over the body  . Penicillins Hives    Has patient had a PCN reaction causing immediate rash, facial/tongue/throat swelling, SOB or lightheadedness with hypotension: No Has patient had a PCN reaction causing severe rash involving mucus membranes or skin necrosis: No Has patient had a PCN reaction that required hospitalization: No Has patient had a PCN reaction occurring within the last 10 years: No If all of the above answers are "NO", then may proceed with Cephalosporin use.   All over the body    Current Medications, Allergies, Past Medical History, Past Surgical History, Family History, and Social History were reviewed in [employer].    Review of Systems         See HPI - all other systems neg except as noted... The patient complains of dyspnea on exertion.  The patient denies anorexia, fever, weight loss, weight gain, vision loss, decreased hearing, hoarseness, chest pain, syncope, peripheral edema,  prolonged cough, headaches, hemoptysis, abdominal pain, melena, hematochezia, severe indigestion/heartburn, hematuria, incontinence, muscle weakness, suspicious skin lesions, transient blindness, difficulty walking, depression, unusual weight change, abnormal bleeding, enlarged lymph nodes, and angioedema.   Objective:   Physical Exam     WD, WN, 7328/o BF in NAD... GENERAL:  Alert & oriented; pleasant & cooperative... HEENT:  [place]/AT, EOM-wnl, PERRLA, EACs-clear, TMs-wnl, NOSE-clear, THROAT-clear & wnl. NECK:  Supple w/ fairROM; no JVD; normal carotid impulses w/o bruits; no thyromegaly or nodules palpated; no lymphadenopathy. CHEST:  Clear to P & A; without wheezes/ rales/ or rhonchi heard... HEART:  Regular Rhythm; without murmurs/ rubs/ or gallops detected... ABDOMEN:  Soft & nontender; normal bowel sounds; no organomegaly or  masses palpated.... EXT: without deformities, mild arthritic changes & decr ROM left knee; no varicose veins/ +venous insuffic/ 2+edema. NEURO:  CN's intact; no focal neuro deficits... DERM:  No lesions noted; no rash etc...  RADIOLOGY DATA:  Reviewed in the EPIC EMR & discussed w/ the patient...  LABORATORY DATA:  Reviewed in the EPIC EMR & discussed w/ the patient...   Assessment:      10/11/17>   Her CXR, Spirometry, O2sats, Labs, & 2DEcho are all good- 2D is now back to norm after 60moon Xarelto for her PTE;  I agree w/ DrMarcellino that it doesn't make sense to pursue additional diagnostic evaluation at this juncture & I agree w/ the recommendation for cough suppression therapy;  [place] refuses to go back to [facility] for the [place] cough program & [place]  I would simply recommend continuing her cough suppression w/ Tussionex or similar cough medication that has worked well for her in the past;  OK to slowly wean her bronchodilator therapy given her PFT results and lack of asthma exacerbations over the last several yrs;  Currently still  on Xarelto & DrCarter's notes indicate that she plans to Rx for 138yrhen consider stopping the anticoag therapy... I would be happy to recheck pt in the future if desired.   AR>  On allergy shots & meds from DrFort [place]nercontinue same... Asthma>  On meds as listed, breathing is stable and rec to slowly wean bronchodil meds... PTE 8/18>  On Xarelto, 2DEcho 2/19 shows resolution of the right heart strain... Chr cough>  See discussion above- rec to continue cough suppresssion therapy as needed...   ENT Eval & [facility], DrMarcellino   HBP>              [facility]... VI/ Edema>  Chol>  Overweight>  GI> per GI, DrDanis... DJD>     Plan:     Patient's Medications  New Prescriptions   No medications on file  Previous Medications   ALBUTEROL (PROVENTIL HFA;VENTOLIN HFA) 108 (90 BASE) MCG/ACT INHALER    Inhale 2 puffs into the lungs every 6 (six) hours as needed for wheezing or shortness of breath.   BUDESONIDE-FORMOTEROL (SYMBICORT) 160-4.5 MCG/ACT INHALER    Inhale 2 puffs into the lungs 2 (two) times daily.     CALCIUM CARB-CHOLECALCIFEROL (CALCIUM 600 + D PO)    Take by mouth. Take one tablet daily   CETIRIZINE (ZYRTEC) 10 MG TABLET    Take 10 mg by mouth daily.   CHLORPHENIRAMINE-HYDROCODONE (TUSSIONEX PENNKINETIC ER) 10-8 MG/5ML SUER    Take 5 mLs by mouth every 12 (twelve) hours as needed for cough.   FLUTICASONE (FLONASE) 50 MCG/ACT NASAL SPRAY    2 sprays daily.   FUROSEMIDE (LASIX) 40 MG TABLET    Take one tablet by mouth once daily   KLOR-CON M20 20 MEQ TABLET    TAKE 2 TABLETS EVERY DAY FOR POTASSIUM SUPPLEMENT   LEVOCETIRIZINE (XYZAL) 5 MG TABLET    Take 1 tablet (5 mg total) by mouth every evening.   LOSARTAN (COZAAR) 25 MG TABLET    Take 1 tablet (25 mg total) by mouth daily. Take with 50 mg tablet for a total of 75 mg daily.   LOSARTAN (COZAAR) 50 MG TABLET    Take one tablet by mouth once daily along with 2553mor a total of  9m29mily   MONTELUKAST (SINGULAIR) 10 MG  TABLET    Take 10 mg by mouth at bedtime.     MULTIPLE VITAMINS-MINERALS (MACULAR VITAMIN BENEFIT PO)    Take 1 capsule by mouth 2 (two) times daily.     NIFEDIPINE (PROCARDIA XL/ADALAT-CC) 60 MG 24 HR TABLET    Take 1 tablet (60 mg total) by mouth daily.   NON FORMULARY    Allergy shots once every other week   OMEGA-3 FATTY ACIDS (FISH OIL PO)    Take by mouth daily.   PANTOPRAZOLE (PROTONIX) 40 MG TABLET    Take 1 tablet (40 mg total) by mouth 2 (two) times daily. For stomach   PRAVASTATIN (PRAVACHOL) 40 MG TABLET    TAKE 1 TABLET EVERY DAY   PSYLLIUM (METAMUCIL) 58.6 % PACKET    Take 1 packet by mouth daily as needed (constipation).   RANITIDINE (ZANTAC) 300 MG TABLET    Take 300 mg by mouth at bedtime.   RIVAROXABAN (XARELTO) 20 MG TABS TABLET    Take 1 tablet (20 mg total) by mouth daily.   SPIRIVA HANDIHALER 18 MCG INHALATION CAPSULE    18 mcg 2 (two) times daily.   VITAMIN B-12 (CYANOCOBALAMIN) 1000 MCG TABLET    Take 1,000 mcg by mouth daily.  Modified Medications   No medications on file  Discontinued Medications   No medications on file

## 2017-10-11 NOTE — Patient Instructions (Addendum)
Today we updated your med list in our EPIC system...    Continue your current medications the same...  Today we re-assessed your respiratory system, especially as it relates to your [phone] pulmonary embolus (blood clots)...  We checked a follow up breathing test, ambulatory oximetry, CXR, and blood work...  We will also schedule a follow up 2D-Echocardiogram to check your heart...    We will contact you w/ the results when available...   We reviewed our previous evaluations, plus the work-up from GI- DrDanis, and your ENT team at [facility]... I agree with DrMarcellino that the best course is for you to continue cough suppression therapy as needed...  We will inquire with the Speech Pathologists here in [place] to see if they have any specific cough suppression therapy progam...  Call for any questions...  Let's plan a follow up visit in 6 weeks, sooner if needed for acute problems.Marland KitchenMarland Kitchen

## 2017-10-13 DIAGNOSIS — H35033 Hypertensive retinopathy, bilateral: Secondary | ICD-10-CM | POA: Diagnosis not present

## 2017-10-13 DIAGNOSIS — H353132 Nonexudative age-related macular degeneration, bilateral, intermediate dry stage: Secondary | ICD-10-CM | POA: Diagnosis not present

## 2017-10-13 DIAGNOSIS — H40023 Open angle with borderline findings, high risk, bilateral: Secondary | ICD-10-CM | POA: Diagnosis not present

## 2017-10-13 DIAGNOSIS — H26492 Other secondary cataract, left eye: Secondary | ICD-10-CM | POA: Diagnosis not present

## 2017-10-13 NOTE — Progress Notes (Signed)
Was able to talk to the patient regarding their results.  They verbalized an understanding of what was discussed. No further questions at this time. 

## 2017-10-16 ENCOUNTER — Other Ambulatory Visit: Payer: Self-pay

## 2017-10-16 ENCOUNTER — Ambulatory Visit (HOSPITAL_COMMUNITY): Payer: PPO | Attending: Cardiovascular Disease

## 2017-10-16 DIAGNOSIS — E669 Obesity, unspecified: Secondary | ICD-10-CM | POA: Diagnosis not present

## 2017-10-16 DIAGNOSIS — I119 Hypertensive heart disease without heart failure: Secondary | ICD-10-CM | POA: Diagnosis not present

## 2017-10-16 DIAGNOSIS — J4530 Mild persistent asthma, uncomplicated: Secondary | ICD-10-CM | POA: Diagnosis not present

## 2017-10-16 DIAGNOSIS — I10 Essential (primary) hypertension: Secondary | ICD-10-CM | POA: Diagnosis not present

## 2017-10-20 DIAGNOSIS — J301 Allergic rhinitis due to pollen: Secondary | ICD-10-CM | POA: Diagnosis not present

## 2017-10-20 DIAGNOSIS — J3081 Allergic rhinitis due to animal (cat) (dog) hair and dander: Secondary | ICD-10-CM | POA: Diagnosis not present

## 2017-10-20 DIAGNOSIS — J3089 Other allergic rhinitis: Secondary | ICD-10-CM | POA: Diagnosis not present

## 2017-11-03 DIAGNOSIS — J3081 Allergic rhinitis due to animal (cat) (dog) hair and dander: Secondary | ICD-10-CM | POA: Diagnosis not present

## 2017-11-03 DIAGNOSIS — J301 Allergic rhinitis due to pollen: Secondary | ICD-10-CM | POA: Diagnosis not present

## 2017-11-03 DIAGNOSIS — J3089 Other allergic rhinitis: Secondary | ICD-10-CM | POA: Diagnosis not present

## 2017-11-06 ENCOUNTER — Ambulatory Visit: Payer: PPO | Admitting: Podiatry

## 2017-11-07 ENCOUNTER — Other Ambulatory Visit: Payer: Self-pay | Admitting: Internal Medicine

## 2017-11-08 ENCOUNTER — Encounter: Payer: Self-pay | Admitting: Podiatry

## 2017-11-08 ENCOUNTER — Ambulatory Visit: Payer: PPO | Admitting: Podiatry

## 2017-11-08 DIAGNOSIS — B351 Tinea unguium: Secondary | ICD-10-CM | POA: Diagnosis not present

## 2017-11-08 DIAGNOSIS — M79675 Pain in left toe(s): Secondary | ICD-10-CM | POA: Diagnosis not present

## 2017-11-08 DIAGNOSIS — M79674 Pain in right toe(s): Secondary | ICD-10-CM

## 2017-11-08 NOTE — Progress Notes (Addendum)
Complaint:  Visit Type: Patient returns to my office for continued preventative foot care services. Complaint: Patient states" my nails have grown long and thick and become painful to walk and wear shoes" . The patient presents for preventative foot care services. No changes to ROS  Podiatric Exam: Vascular: dorsalis pedis and posterior tibial pulses are palpable bilateral. Capillary return is immediate. Temperature gradient is WNL. Skin turgor WNL  Sensorium: Normal Semmes Weinstein monofilament test. Normal tactile sensation bilaterally. Nail Exam: Pt has thick disfigured discolored nails with subungual debris noted bilateral entire nail hallux through fifth toenails Ulcer Exam: There is no evidence of ulcer or pre-ulcerative changes or infection. Orthopedic Exam: Muscle tone and strength are WNL. No limitations in general ROM. No crepitus or effusions noted. Foot type and digits show no abnormalities. HAV with hammer toe  B/L. Skin: No Porokeratosis. No infection or ulcers  Diagnosis:  Onychomycosis, , Pain in right toe, pain in left toes  Treatment & Plan Procedures and Treatment: Consent by patient was obtained for treatment procedures.   Debridement of mycotic and hypertrophic toenails, 1 through 5 bilateral and clearing of subungual debris. No ulceration, no infection noted.  Return Visit-Office Procedure: Patient instructed to return to the office for a follow up visit 3 months for continued evaluation and treatment.    Sheniqua Carolan DPM 

## 2017-11-17 DIAGNOSIS — J301 Allergic rhinitis due to pollen: Secondary | ICD-10-CM | POA: Diagnosis not present

## 2017-11-17 DIAGNOSIS — J3081 Allergic rhinitis due to animal (cat) (dog) hair and dander: Secondary | ICD-10-CM | POA: Diagnosis not present

## 2017-11-17 DIAGNOSIS — J3089 Other allergic rhinitis: Secondary | ICD-10-CM | POA: Diagnosis not present

## 2017-11-22 ENCOUNTER — Ambulatory Visit: Payer: PPO | Admitting: Pulmonary Disease

## 2017-11-24 ENCOUNTER — Telehealth: Payer: Self-pay | Admitting: *Deleted

## 2017-11-24 DIAGNOSIS — R053 Chronic cough: Secondary | ICD-10-CM

## 2017-11-24 DIAGNOSIS — R05 Cough: Secondary | ICD-10-CM

## 2017-11-24 DIAGNOSIS — R059 Cough, unspecified: Secondary | ICD-10-CM

## 2017-11-24 MED ORDER — HYDROCOD POLST-CPM POLST ER 10-8 MG/5ML PO SUER: 5.0000 mL | Freq: Two times a day (BID) | ORAL | 0 refills | Status: DC | PRN

## 2017-11-24 NOTE — Telephone Encounter (Signed)
Patient called and requested a refill on her cough medication Tussionex.  [place] Verified LR: 08/29/2017 Pharmacy Confirmed.  Pended Rx and sent to Dr. Eulas Post for approval.

## 2017-11-27 ENCOUNTER — Ambulatory Visit: Payer: PPO | Admitting: Pulmonary Disease

## 2017-11-27 ENCOUNTER — Encounter: Payer: Self-pay | Admitting: Pulmonary Disease

## 2017-11-27 VITALS — BP 138/76 | HR 88 | Temp 98.5°F | Ht 63.0 in | Wt 220.8 lb

## 2017-11-27 DIAGNOSIS — K219 Gastro-esophageal reflux disease without esophagitis: Secondary | ICD-10-CM

## 2017-11-27 DIAGNOSIS — R05 Cough: Secondary | ICD-10-CM

## 2017-11-27 DIAGNOSIS — J301 Allergic rhinitis due to pollen: Secondary | ICD-10-CM | POA: Diagnosis not present

## 2017-11-27 DIAGNOSIS — R490 Dysphonia: Secondary | ICD-10-CM

## 2017-11-27 DIAGNOSIS — J4520 Mild intermittent asthma, uncomplicated: Secondary | ICD-10-CM

## 2017-11-27 DIAGNOSIS — I2609 Other pulmonary embolism with acute cor pulmonale: Secondary | ICD-10-CM | POA: Diagnosis not present

## 2017-11-27 DIAGNOSIS — R059 Cough, unspecified: Secondary | ICD-10-CM

## 2017-11-27 MED ORDER — BUDESONIDE-FORMOTEROL FUMARATE 160-4.5 MCG/ACT IN AERO: 2.0000 | INHALATION_SPRAY | Freq: Two times a day (BID) | RESPIRATORY_TRACT | 0 refills | Status: DC

## 2017-11-27 NOTE — Progress Notes (Signed)
Subjective:     Patient ID: Wendy Figueroa, female   DOB: 02-07-1946, 72 y.o.   MRN: [id number]  HPI 72 y/o BF here for a yearly follow up and med refills... he has multiple medical problems as noted below... She is an LPN... ~  SEE PREV EPIC NOTES FOR OLDER DATA >>   ~  August 28, 2012:  56moROV & Analyssa reports a good interval w/o new complaints or concerns...     AR> on Xyzal5, Singulair10, Omnaris, & allergy [place] & his note is reviewed...    Asthma> on Symbicort160, NEBS w/ Albut, & Tussionex; she denies recent asthma exac, breathing well w/o wheezing, cough, SOB, etc...    HBP> on ProcXL60, Losar50, Lasix40, K20; BP= 140/70 & she denies CP, palpit, dizzy, ch in SOB/DOE, edema...    Chol> on Prav40 + FishOil; FLP shows TChol 162, TG 35, HDL 59, LDL 96    Overweight> wt down 5# to 215# today; we reviewed diet, exercise, wt reduction strategies...    GI> GERD, Divertics, Polyps, Elev AlkPhos> on Zegerid & followed by DrDBrodie; last colonoscopy was 7/06 & follow up planned 130yr    DJD> on Mobic15 as needed; she has been seen by DrGioffre in the past w/ shot in the left knee which helped... 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    Asthma> on Symbicort160, NEBS w/ Albut, & Tussionex; she denies recent asthma exac, breathing well w/o wheezing, cough, SOB, etc; requests refill Tussionex...    HBP> on ProcXL60, Losar50, Lasix40, K20; BP= 124/84 & she denies CP, palpit, dizzy, ch in SOB/DOE, edema...    Chol> on Prav40 +  FishOil; FLP 2/15 shows TChol 151, TG 71, HDL 55, LDL 82    Overweight> wt up 2# to 217# today (BMI=37); we reviewed diet, exercise, wt reduction strategies...    GI> GERD, Divertics, Polyps, Elev AlkPhos> on Zegerid & followed by DrDBrodie; last colonoscopy was 7/06 & follow up planned 10y58yr  DJD> on Mobic15 as needed; she has been seen by DrGioffre in the past w/ shot in the left knee which helped; now c/o right knee pain, we will Rx MOBIC15 7 refer to [facility] for XRays & eval... We reviewed prob list, meds, xrays and labs> see below for updates >> She had the 2014 flu vaccine in Oct2014... Requests refill tussionex...  LABS 2/15:  FLP- at goals on Prav40;  Chems- wnl x sl elev AlkPhos=145;  CBC- ok w/ Hg=12.6, MCV=83;  TSH=1.41...   ~  Jan 08, 2014:  3-91mo 53moand LenorAreta/o 6mo h57moery bad allergies" despite her meds: Xyzal5Vertell Novakrgy shots, Singulair10, Symbicort160, ProairHFA, and Tussionex;  She called DrSharma "but he would not refill my Tussionex and I need it" she says;  She notes cough, congestion, min sput, no hemoptysis, sl SOB/DOE w/o change, and denies CP/ palpit/ edema;  She doesn't like Pred rx...  Exam w/  sl decr BS bilat but no wheezing;  She denies reflux symptoms on her Zegerid rx;  CXR is clear, and PF is superphysiologic but the tracing is inaccurate;  We decided to treat w/ Depo80, Medrol dosepak, and continue current regimen as outlined...     We reviewed prob list, meds, xrays and labs> see below for updates >> She has arranged for Primary Care thru [facility]andey [facility]...   CXR 5/15 showed normal heart size, clear lungs, NAD...   PFT 5/15 showed FVC=2.94 (120%), FEV1=2.53 (135%), %1sec=86, mid-flows=219% predicted, but her tracing shows a hitch & the numbers are not believed to be accurate despite mult attempts...  ~  December 16, 2014:  71moROV & LLashellreports doing reasonably well- she still follows w/ DrSharma Q642mo he fills most of her  meds;  States her breathing is pretty good, notes some cough & occas "spasms", SOB, wheezing;  [facility] w/ problems as listed below...     AR> on Xy[place]udsonSingulair10, Flonases, & allergy shots [place] we do not have recent notes to review...    Asthma> on Symbicort160, Spiriva daily, ProairHFA as needed, & Tussionex; she denies recent asthma exac, breathing well w/o wheezing, cough, SOB, etc; requests refill Tussionex... We reviewed prob list, meds, xrays and labs> see below for updates >>   ~  October 11, 2017:  27yr34yr & LenoLillienurns having been re-consulted per [facility]- Drs MarcBlenda Nicelyater due to her chronic cough... There is a lot to get caught up on over the last 3 yrs>> We reviewed the interval Epic records as follows>     When I last saw the pt in 2016>  We were treating her long-standing chronic irritative cough w/ prn Tussionex;  She has chronic allergies followed by DrSharma on immunotherapy x yrs, plus Xyzal, Singulair10, & Flonase; and Asthma controlled on triple therapy w/ Symbicort160-2spBid & Spiriva once daily, with a rescue inhaler as needed...     She was followed by ENT- [facility], DrMadden & last seen 10/11/16> followed for LPRD (hx HH & GERD), pharyngoesoph dysphagia (w/ prom cricopharyngeal bar), dysphonia (primary muscle tension), globus sensation & cough; they did extensive work-up & tried mult therapies including esoph dilatation, botox injections, Neurontin rx; they did repeat MBS- and offered UES dilatation, Botox injection, & poss CP myotomy & she opted for the direct laryngoscopy, esoph dilatation, and cricopharyngeal botox injection- this was done on 11/21/16; she reported improved after this treatment & they rec continued antireflux regimen & Neurontin 100=>300 Qhs...     She was HOSP 8/5 - 03/29/17 by [facility] after presenting w/ sudden dyspnea- CXR was clear & she was hypoxemic, subseq CTA revealed  (submassive) bilat pulm emboli; 2DEcho showed EF=50-55% w/ RV dil & decr RVF; VenDopplers showed a superficial thrombosis of R-lesser saphenous vein, hypercoag panel was NEG; she was treated w/ heparin=> Xarelto...    She [facility] improved overall, noted chr leg swelling 7 wearing TED hose,     She continued to f/u w/ Allergy-DrSharma 05/19/17> AR & Asthma- felt to be stable, no changes made & no mention of her cough...     In Jan 2019 she had f/u w/ PCP> c/o recurrent dysphagia & cough, stated swallowing worse despite Protonix40Bid=> rec f/u w/ ENT & Pulm...    [facility]> notes her long hx chronic cough, perrenial allergies, controlled asthma, GERD symptoms/ HH/ interm hoarseness, & dysphagia; they reviewed extensive records, performed flex laryngoscopy (essent neg), and concluded that the best approach was to proceed w/ cough suppressive therapy thru their speech & language pathology dept, while continuing an antireflux regimen... (NOTE: pt declined to ret to [facility] for the needed SLP cough suppression therapy)...  We reviewed the following medical problems during today's office visit>     ENT evaluation> [facility]- DrMadden, now DrMarcellino, and notes reviewed- hx LPRD (hx HH & GERD), pharyngoesoph dysphagia (w/ prom cricopharyngeal bar), dysphonia (primary muscle tension), globus sensation & cough; they did extensive work-up & tried mult therapies including esoph dilatation, botox injections, Neurontin rx...     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     AR> on [place], Singulair10, Flonase, & allergy shots from [place] who continues to monitor the patient regularly...    Asthma> on Symbicort160-2spBid, Spiriva  once daily, NEBS w/ Albut- prn;; she denies recent asthma exac, breathing well w/o wheezing, cough, SOB, etc...    Chronic cough> despite mult extensive evaluations in past- she has a persistent chr cough that requires Tussionex to suppress- she uses lozenges, cough drops, etc as well... She was prev easily managed w/ Tussionex prn-- we discussed re-checking CXR, Spirometry, Ambulatory Oximetry, & 2DEcho...    GI> GERD, Divertics, Polyps, Elev AlkPhos> on Protonix40Bid, Zantac300Qhs, & prev followed by DrDBrodie and last colonoscopy was 2014 w/ tubular adenoma removed; she was seen 01/2016 by DrDanis- constip w/ some fecal incontinence & rec to incr fiber...     Medical issues> managed by DrMCarter; HBP on Procardia, Losartan, Lasix, KCl;  HL on Pravastatin & Omega=3s; Overweight on diet & exercise program; DJD on OTC analgesics + shots from Ortho; Anemic w/ Hg~11 & mcv~79 EXAM revealed Afeb, VSS, O2sat=98% on RA, Wt=225#, 5'3"Tall, BMI=40;  HEENT- neg, mallampati2;  Chest- decr BS bilat, clear w/o w/r/r;  Heart- RR w/o m/r/g;  Abd- obese, soft, nontender;  Ext- VI, tr-1+ edema w/o c/c;  Neuro- intact w/o focal abn...  CXR 10/11/17 (independently reviewed by me in the PACS system) showed norm heart size, clear lungs x min linear atx left lung base- NAD, kyphosis & DJD in Tspine...   Spirometry 10/11/17> FVC=2.25 (104%), FEV1=2.02 (121%), %1sec=90, mid-flows are superphysiologic at 223% predicted... The flow-vol loop tracing is poor however...  Ambulatory Oximetry 10/11/17>  O2sat=99% on RA at rest w/ pulse=92/min;  She ambulated 3 laps (185'ea) w/ lowest O2sat=98% w/ pulse=136/min...   LABS 10/11/17>  Chems- wnl x BS=124, Cr=0.95, LFTs wnl x AlkPhos=156;  CBC- wnl w/ Hg=12.2, mcv=82, eos=2.6%;  TSH=1.30;  BNP=19  2DEcho done 10/16/17>  Norm LV size & function w/ EF=55-60%, no regional wall motion abn, gr1DD, AoV- ok, mildly thickened leaflets w/o stenosis or regurg, MV- norm w/o stenosis or regurg, RV cavity  size & sys function were WNL, PAsys=56mHg... IMP/PLAN>>  Her CXR, Spirometry, O2sats, Labs, & 2DEcho are all good- 2D is now back to norm after 669mon Xarelto for her PTE;  I agree w/ DrMarcellino that it doesn't make sense to pursue additional diagnostic evaluation at this juncture & I agree w/ the recommendation for cough suppression therapy;  Pt refuses to go back to [facility] for the SLP cough program & there  is nothing like this avail in [place];  I would simply recommend continuing her cough suppression w/ Tussionex or similar cough medication that has worked well for her in the past;  OK to slowly wean her bronchodilator therapy given her PFT results and lack of asthma exacerbations over the last several yrs;  Currently still on Xarelto & DrCarter's notes indicate that she plans to Rx for 66yrthen consider stopping the anticoag therapy... I would be happy to recheck pt in the future if desired...  Note:  >50% of this 626m appt was spent in counseling & coordination of care...    ~  November 27, 2017:  6wk ROV & pulmonary follow up>  Pt returns to review prev eval & discuss results which I was very pleased to do with her- she has AR, Hx asthma, and chr cough w/ LPRD, HH/GERD, pharyngoesoph dysphagia, dysphonia, & globus sensation;  As noted by ENT- she's had extensive work-up & mult treatment modalities tried, but she seems to do the best on prn Tussionex for her cough;  Her most serious medical issue is her hx of a submassive pulm embolism 03/2017=> hosp records reviewed; she has been on Xarelto since that time, currently '20mg'$ /d... Her PCP is Dr. MoGildardo Cranker.    We discussed her ENT evaluation from DrMadden in the past & DrMarcellino recently- she also rec cough suppression therapy & [facility] to [facility] but [place] to [place] the trip to [place] w/ the road construction presently underway; there is no similar program avail here in [place] & we reviewed her Tussionex rx...    She's been  counseled on the needed vigorous antireflux rgimen> Protonix40 taken 30 min before the 1st & last meals of the day, NPO after dinner, elev HOB 6"...    We reviewed her Allergy & Asthma history- PFTs have been good, no recent asthma exac, etc; she is controlled on Symbicort160-2spBid, Spiriva once daily, Singulair10, AlbutHFA rescue inhaler as needed, Flonase/ Xyzal/ allergy shots per DrSharma; we discussed weaning some of these meds as able...    EXAM revealed Afeb, VSS, O2sat=98% on RA, Wt=221#, 5'3"Tall, BMI=40;  HEENT- neg, mallampati2;  Chest- decr BS bilat, clear w/o w/r/r;  Heart- RR w/o m/r/g;  Abd- obese, soft, nontender;  Ext- VI, tr-1+ edema w/o c/c;  Neuro- intact w/o focal abn...  CXR & LABS from 09/2017 were reviewed... IMP/PLAN>>  We reviewed the need for DIET/ EXERCISE/ Wt reduction;  Recommended to continue current meds, Tussionex for prn use refilled, advised to start Guaifenesin and STAY ON THIS ('2400mg'$ /d in divided doses w/ fluids);  We discussed my retirement at the end of the year & f/u w/ me can be prn...            Problem List:  ALLERGIC RHINITIS (ICD-477.9) - followed by DrSharma on shots weekly... also takes FLONASE 2sp in each nostril daily,  SINGULAIR '10mg'$ /d,  XYZAL '5mg'$ /d... prev allergy tests + for trees, grass, ragweed, molds, dust, cat>dog, etc...  Hx of SINUSITIS (ICD-473.9) - no recent infections or antibiotics required.  ASTHMA (ICD-493.90) - Former pt of DrSlotnick yrs ago, also eval by DrSt. Jamest [employer]. stable on SYMBICORT 160 2spBid, SPIRIVA daily, & VENTOLIN HFA as needed (DrSharma prescribes these meds)... she has min cough, small amt clear sputum and no recent wheezing, increased dyspnea, chest pain, etc... ~  CXR 11/10 showed norm heart size, ectatic/ calcif ao, clear lungs w/ min peribronch thickening, NAD...Marland KitchenMarland Kitchen~  2/15: on Symbicort160, NEBS w/ Albut, & Tussionex; she denies recent asthma exac, breathing well w/o wheezing, cough, SOB, etc; requests  refill Tussionex. ~  5/15: c/o 7mohx "very bad allergies" despite her meds: Xyzal5, Omnaris, Allergy shots, Singulair10, Symbicort160, ProairHFA, and Tussionex; she notes cough, congestion, min sput, no hemoptysis, sl SOB/DOE w/o change, and denies CP/ palpit/ edema;  Exam w/ sl decr BS bilat but no wheezing;  She denies reflux symptoms on her Zegerid rx;  CXR is clear, and PF is superphysiologic but the tracing is inaccurate;  We decided to treat w/ Depo80, Medrol dosepak, and continue current regimen as outlined...  ~  CXR 5/15 showed normal heart size, clear lungs, NAD...  ~  PFT 5/15 showed FVC=2.94 (120%), FEV1=2.53 (135%), %1sec=86, mid-flows=219% predicted, but her tracing shows a hitch & the numbers are not believed to be accurate despite mult attempts... ~  4/16: on Symbicort160, Spiriva daily, ProairHFA as needed, & Tussionex; she denies recent asthma exac, breathing well w/o wheezing, cough, SOB, etc; requests refill Tussionex.  HYPERTENSION (ICD-401.9) - on PROCARDIA XL '60mg'$ /d,  LOSARTAN50, LASIX '40mg'$ /d,  KCl 275m- 2/d... ~  2/12:  add-on for elev BP at church exerc program> we added LOSARTAN '50mg'$ /d. ~  1/13:  BP= 136/80 and she denies HA, fatigue, visual changes, CP, palipit, dizziness, syncope, dyspnea, edema, etc... ~  1/14:  BP= 142/70 & she remains essentially asymptomatic...  ~  2/15: on ProcXL60, Losar50, Lasix40, K20; BP= 124/84 & she denies CP, palpit, dizzy, ch in SOB/DOE, edema. ~  [place] [facility] [place], Lasix40... BP remains under good control.  VENOUS INSUFFICIENCY (ICD-459.81) - she follows low sodium diet, elevates legs, wears support hose, and takes the Lasix...  HYPERCHOLESTEROLEMIA, BORDERLINE (ICD-272.4) - on PRAVASTATIN '40mg'$ /d + low fat diet... ~  [place]/07 showed TChol 190, TG 68, HDL 56, LDL 120 ~  FLP 7/09 showed TChol 202, TG 53, HDL 52, LDL 141... rec> add Prav40. ~  FL[place]1/10 on Prav40 showed TChol 171, TG 70, HDL  57, LDL 100 ~  FLP 12/11 on Prav40 showed TChol 150, TG 36, HDL 56, LDL 87 ~  FLP 1/13 on Prav40 showed TChol 162, TG 38, HDL 63, LDL 91 ~  FLP 1/14 on Prav40 showed TChol 162, TG 35, HDL 59, LDL 96 ~  FLP 2/15 on Prav40 showed TChol 151, TG 71, HDL 55, LDL 82  ~  [facility]...  OVERWEIGHT (ICD-278.02) - discussed low carb/ low fat diet and increased exercise program... ~  weight 7/09 is down 5# to 203#... she knows that she needs to do better... ~  weight 11/10 = 212# ~  weight 12/11 = 215# ~  weight 2/12 = 209# ~  Weight 1/13 = 220# & we reviewed low carb, low fat, wt reducing diet! ~  Weight 1/14 = 215#  ~  Weight 2/15 = 217# ~  Weight 4/16 = 216#  GERD (ICD-530.81) - on ZEGERID prn, & off prev reglan rx... EGD 9/02 showed sliding HH, mild gastritis, neg biopsy...  DIVERTICULOSIS OF COLON (ICD-562.10) + COLONIC POLYPS (ICD-211.3) >>  ~  Colonoscopy 7/06 by DrDBrodie showed extensive divertics, spasm, redund colon, hems, and sm polyp (no path avail)... ~  Colonoscopy 10/14 by DrDBrodie showed 2 polyps in desc colon (Path= tub adenoma) & f/u planned 5y63yr.   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 she never ret  for the GGT. ~  labs 11/10 showed AlkPhos= 195 ( 39-117), GGT= 54 (7-51)... continue to follow. ~  labs 12/11 showed AlkPhos= 158 ~  Labs 1/13 showed AlkPhos= 152 ~  Labs 1/14 showed AlkPhos= 146 ~  Labs 2/15 showed AlkPhos= 145  DEGENERATIVE JOINT DISEASE (ICD-715.90) ~  11/10: c/o left knee pain & XRay shows joint effusion- trial MOBIC '15mg'$ /d & refer to Ortho> seen by DrGioffre & given knee injection.  DERM >> she has mult seborrheic keratoses, cherry red spots, and a sebaceous cyst on her back==> referred to [facility]. ~  Onychomycosis w/ periodic nail debridement by [place] [facility] -  GYN= DrARoberts... BMD at [facility] office 2005 was WNL w/ TScores -0.9 to +1.1; repeat  7/13 by Gyn was still wnl w/ lowest Tscore -0.9 in Spine... [facility]ammograms neg [facility] 7/14 (dense breasts)... s/p hyst, and A/P repair by DrRoberts 3/10... she had PNEUMOVAX in 2006 (age58) & repeated 12/11 (age64).   Past Surgical History:  Procedure Laterality Date  . ABDOMINAL HYSTERECTOMY  1988  . ANTERIOR AND POSTERIOR REPAIR  11/05/08  . APOGEE / PERIGEE REPAIR  10/2008   Dr. Octavio Manns  . bladder tack  2011  . BREAST SURGERY  1978   cyst removed from both removed  . COLONOSCOPY    . ESOPHAGOGASTRODUODENOSCOPY      Outpatient Encounter Medications as of 11/27/2017  Medication Sig  . albuterol (PROVENTIL HFA;VENTOLIN HFA) 108 (90 Base) MCG/ACT inhaler Inhale 2 puffs into the lungs every 6 (six) hours as needed for wheezing or shortness of breath.  . budesonide-formoterol (SYMBICORT) 160-4.5 MCG/ACT inhaler Inhale 2 puffs into the lungs 2 (two) times daily.    . Calcium Carb-Cholecalciferol (CALCIUM 600 + D PO) Take by mouth 2 (two) times daily. Take one tablet daily   . cetirizine (ZYRTEC) 10 MG tablet Take 10 mg by mouth daily.  . chlorpheniramine-HYDROcodone (TUSSIONEX PENNKINETIC ER) 10-8 MG/5ML SUER Take 5 mLs by mouth every 12 (twelve) hours as needed for cough.  . fluticasone (FLONASE) 50 MCG/ACT nasal spray 2 sprays daily.  . furosemide (LASIX) 40 MG tablet Take one tablet by mouth once daily  . KLOR-CON M20 20 MEQ tablet TAKE 2 TABLETS EVERY DAY FOR POTASSIUM SUPPLEMENT  . levocetirizine (XYZAL) 5 MG tablet Take 1 tablet (5 mg total) by mouth every evening.  Marland Kitchen losartan (COZAAR) 25 MG tablet Take 1 tablet (25 mg total) by mouth daily. Take with 50 mg tablet for a total of 75 mg daily.  Marland Kitchen losartan (COZAAR) 50 MG tablet Take one tablet by mouth once daily along with '25mg'$  for a total of '75mg'$  daily  . montelukast (SINGULAIR) 10 MG tablet Take 10 mg by mouth at bedtime.    . Multiple Vitamins-Minerals (MACULAR VITAMIN BENEFIT PO) Take 1 capsule by mouth 2 (two) times daily.    Marland Kitchen  NIFEdipine (PROCARDIA XL/ADALAT-CC) 60 MG 24 hr tablet Take 1 tablet (60 mg total) by mouth daily.  . NON FORMULARY Allergy shots once every other week  . Omega-3 Fatty Acids (FISH OIL PO) Take by mouth daily.  . pantoprazole (PROTONIX) 40 MG tablet TAKE 1 TABLET (40 MG TOTAL) BY MOUTH 2 (TWO) TIMES DAILY. FOR STOMACH  . pravastatin (PRAVACHOL) 40 MG tablet TAKE 1 TABLET EVERY DAY  . psyllium (METAMUCIL) 58.6 % packet Take 1 packet by mouth daily as needed (constipation).  . ranitidine (ZANTAC) 300 MG tablet Take 300 mg by mouth at bedtime.  . rivaroxaban (XARELTO)  20 MG TABS tablet Take 1 tablet (20 mg total) by mouth daily.  Marland Kitchen SPIRIVA HANDIHALER 18 MCG inhalation capsule 18 mcg 2 (two) times daily.  . vitamin B-12 (CYANOCOBALAMIN) 1000 MCG tablet Take 1,000 mcg by mouth daily.  . budesonide-formoterol (SYMBICORT) 160-4.5 MCG/ACT inhaler Inhale 2 puffs into the lungs 2 (two) times daily.   No facility-administered encounter medications on file as of 11/27/2017.     Allergies  Allergen Reactions  . Demerol Hives    All over the body   . Lisinopril-Hydrochlorothiazide Hives    All over the body  . Penicillins Hives    Has patient had a PCN reaction causing immediate rash, facial/tongue/throat swelling, SOB or lightheadedness with hypotension: No Has patient had a PCN reaction causing severe rash involving mucus membranes or skin necrosis: No Has patient had a PCN reaction that required hospitalization: No Has patient had a PCN reaction occurring within the last 10 years: No If all of the above answers are "NO", then may proceed with Cephalosporin use.   All over the body    Immunization History  Administered Date(s) Administered  . Influenza Split 06/11/2011, 05/27/2013  . Influenza Whole 07/05/2010, 04/23/2012  . Influenza,inj,Quad PF,6+ Mos 05/29/2015, 05/06/2016, 05/17/2017  . Influenza-Unspecified 05/22/2014, 10/26/2015  . Pneumococcal Conjugate-13 10/14/2014  . Pneumococcal  Polysaccharide-23 07/29/2010  . Tdap 08/22/2016    Current Medications, Allergies, Past Medical History, Past Surgical History, Family History, and Social History were reviewed in [employer] record.    Review of Systems         See HPI - all other systems neg except as noted... The patient complains of dyspnea on exertion.  The patient denies anorexia, fever, weight loss, weight gain, vision loss, decreased hearing, hoarseness, chest pain, syncope, peripheral edema, prolonged cough, headaches, hemoptysis, abdominal pain, melena, hematochezia, severe indigestion/heartburn, hematuria, incontinence, muscle weakness, suspicious skin lesions, transient blindness, difficulty walking, depression, unusual weight change, abnormal bleeding, enlarged lymph nodes, and angioedema.   Objective:   Physical Exam     WD, WN, 72 y/o BF in NAD... GENERAL:  Alert & oriented; pleasant & cooperative... HEENT:  [place]/AT, EOM-wnl, PERRLA, EACs-clear, TMs-wnl, NOSE-clear, THROAT-clear & wnl. NECK:  Supple w/ fairROM; no JVD; normal carotid impulses w/o bruits; no thyromegaly or nodules palpated; no lymphadenopathy. CHEST:  Clear to P & A; without wheezes/ rales/ or rhonchi heard... HEART:  Regular Rhythm; without murmurs/ rubs/ or gallops detected... ABDOMEN:  Soft & nontender; normal bowel sounds; no organomegaly or masses palpated.... EXT: without deformities, mild arthritic changes & decr ROM left knee; no varicose veins/ +venous insuffic/ 2+edema. NEURO:  CN's intact; no focal neuro deficits... DERM:  No lesions noted; no rash etc...  RADIOLOGY DATA:  Reviewed in the EPIC EMR & discussed w/ the patient...  LABORATORY DATA:  Reviewed in the EPIC EMR & discussed w/ the patient...   Assessment:      10/11/17>   Her CXR, Spirometry, O2sats, Labs, & 2DEcho are all good- 2D is now back to norm after 64moon Xarelto for her PTE;  I agree w/ DrMarcellino that it doesn't make sense to pursue  additional diagnostic evaluation at this juncture & I agree w/ the recommendation for cough suppression therapy;  [place] to go back to [place] for the [place] cough program & [place]  I would simply recommend continuing her cough suppression w/ Tussionex or similar cough medication that has worked well for her in  the past;  OK to slowly wean her bronchodilator therapy given her PFT results and lack of asthma exacerbations over the last several yrs;  Currently still on Xarelto & DrCarter's notes indicate that she plans to Rx for 25yrthen consider stopping the anticoag therapy... I would be happy to recheck pt in the future if desired. 11/27/17>   We reviewed the need for DIET/ EXERCISE/ Wt reduction;  Recommended to continue current meds, Tussionex for prn use refilled, advised to start Guaifenesin and STAY ON THIS ('2400mg'$ /d in divided doses w/ fluids);  We discussed my retirement at the end of the year & f/u w/ me can be prn.   AR>  On allergy shots & meds from [facility] continue same... Asthma>  On meds as listed, breathing is stable and rec to slowly wean bronchodil meds... PTE 8/18>  On Xarelto, 2DEcho 2/19 shows resolution of the right heart strain... Chr cough>  See discussion above- rec to continue cough suppresssion therapy as needed...   [place] [facility] Dr[facility]   HBP>              [facility]... VI/ Edema>  Chol>  Overweight>  GI> per GI, DrDanis... DJD>     Plan:     Patient's Medications  New Prescriptions   BUDESONIDE-FORMOTEROL (SYMBICORT) 160-4.5 MCG/ACT INHALER    Inhale 2 puffs into the lungs 2 (two) times daily.  Previous Medications   ALBUTEROL (PROVENTIL HFA;VENTOLIN HFA) 108 (90 BASE) MCG/ACT INHALER    Inhale 2 puffs into the lungs every 6 (six) hours as needed for wheezing or shortness of breath.   BUDESONIDE-FORMOTEROL (SYMBICORT) 160-4.5 MCG/ACT INHALER    Inhale 2 puffs into  the lungs 2 (two) times daily.     CALCIUM CARB-CHOLECALCIFEROL (CALCIUM 600 + D PO)    Take by mouth 2 (two) times daily. Take one tablet daily    CETIRIZINE (ZYRTEC) 10 MG TABLET    Take 10 mg by mouth daily.   CHLORPHENIRAMINE-HYDROCODONE (TUSSIONEX PENNKINETIC ER) 10-8 MG/5ML SUER    Take 5 mLs by mouth every 12 (twelve) hours as needed for cough.   FLUTICASONE (FLONASE) 50 MCG/ACT NASAL SPRAY    2 sprays daily.   FUROSEMIDE (LASIX) 40 MG TABLET    Take one tablet by mouth once daily   KLOR-CON M20 20 MEQ TABLET    TAKE 2 TABLETS EVERY DAY FOR POTASSIUM SUPPLEMENT   LEVOCETIRIZINE (XYZAL) 5 MG TABLET    Take 1 tablet (5 mg total) by mouth every evening.   LOSARTAN (COZAAR) 25 MG TABLET    Take 1 tablet (25 mg total) by mouth daily. Take with 50 mg tablet for a total of 75 mg daily.   LOSARTAN (COZAAR) 50 MG TABLET    Take one tablet by mouth once daily along with '25mg'$  for a total of '75mg'$  daily   MONTELUKAST (SINGULAIR) 10 MG TABLET    Take 10 mg by mouth at bedtime.     MULTIPLE VITAMINS-MINERALS (MACULAR VITAMIN BENEFIT PO)    Take 1 capsule by mouth 2 (two) times daily.     NIFEDIPINE (PROCARDIA XL/ADALAT-CC) 60 MG 24 HR TABLET    Take 1 tablet (60 mg total) by mouth daily.   NON FORMULARY    Allergy shots once every other week   OMEGA-3 FATTY ACIDS (FISH OIL PO)    Take by mouth daily.   PANTOPRAZOLE (PROTONIX) 40 MG TABLET    TAKE  1 TABLET (40 MG TOTAL) BY MOUTH 2 (TWO) TIMES DAILY. FOR STOMACH   PRAVASTATIN (PRAVACHOL) 40 MG TABLET    TAKE 1 TABLET EVERY DAY   PSYLLIUM (METAMUCIL) 58.6 % PACKET    Take 1 packet by mouth daily as needed (constipation).   RANITIDINE (ZANTAC) 300 MG TABLET    Take 300 mg by mouth at bedtime.   RIVAROXABAN (XARELTO) 20 MG TABS TABLET    Take 1 tablet (20 mg total) by mouth daily.   SPIRIVA HANDIHALER 18 MCG INHALATION CAPSULE    18 mcg 2 (two) times daily.   VITAMIN B-12 (CYANOCOBALAMIN) 1000 MCG TABLET    Take 1,000 mcg by mouth daily.  Modified  Medications   No medications on file  Discontinued Medications   No medications on file

## 2017-11-27 NOTE — Patient Instructions (Addendum)
Today we updated your med list in our EPIC system...    Continue your current medications the same...  We reviewed your recent evaluation & the wonderful test results...  The most important thing now is to stay on your blood thinner and work on DIET, EXERCISE, & weight reduction...  We discussed "ONE HOUR FOR Wendy Figueroa" daily to go to the [facility] & exercise on the treadmill/ bike/ tone the muscles and move to improve your health...  For the thick mucus>  Use the OTC GUAIRENESIN - 2400mg  daily as follows:    Try the OTC MUCINEX 600mg  tabs- 2 tabs twice daily w/ lots of water...    Try the OTC MUCUS Relief 400mg  tabs- 2 tabs 3 times daily w/ lots of water as well...  Continue your TUSSIIONEX cough syrup every 12H as needed for the cough when it is a problem for you...   Call for any questions...  Let's plan a follow up visit in 2mo, sooner if needed for problems.Marland KitchenMarland Kitchen

## 2017-12-07 DIAGNOSIS — J3081 Allergic rhinitis due to animal (cat) (dog) hair and dander: Secondary | ICD-10-CM | POA: Diagnosis not present

## 2017-12-07 DIAGNOSIS — J3089 Other allergic rhinitis: Secondary | ICD-10-CM | POA: Diagnosis not present

## 2017-12-07 DIAGNOSIS — J301 Allergic rhinitis due to pollen: Secondary | ICD-10-CM | POA: Diagnosis not present

## 2017-12-13 ENCOUNTER — Other Ambulatory Visit: Payer: Self-pay | Admitting: Internal Medicine

## 2017-12-16 DIAGNOSIS — Z1231 Encounter for screening mammogram for malignant neoplasm of breast: Secondary | ICD-10-CM | POA: Diagnosis not present

## 2017-12-16 LAB — HM MAMMOGRAPHY

## 2017-12-18 ENCOUNTER — Encounter: Payer: Self-pay | Admitting: *Deleted

## 2017-12-22 DIAGNOSIS — J301 Allergic rhinitis due to pollen: Secondary | ICD-10-CM | POA: Diagnosis not present

## 2017-12-22 DIAGNOSIS — J3081 Allergic rhinitis due to animal (cat) (dog) hair and dander: Secondary | ICD-10-CM | POA: Diagnosis not present

## 2017-12-22 DIAGNOSIS — J3089 Other allergic rhinitis: Secondary | ICD-10-CM | POA: Diagnosis not present

## 2017-12-24 ENCOUNTER — Other Ambulatory Visit: Payer: Self-pay | Admitting: Internal Medicine

## 2017-12-24 DIAGNOSIS — Z79899 Other long term (current) drug therapy: Secondary | ICD-10-CM

## 2017-12-24 DIAGNOSIS — E785 Hyperlipidemia, unspecified: Secondary | ICD-10-CM

## 2017-12-25 ENCOUNTER — Other Ambulatory Visit: Payer: PPO

## 2017-12-25 DIAGNOSIS — E785 Hyperlipidemia, unspecified: Secondary | ICD-10-CM | POA: Diagnosis not present

## 2017-12-25 DIAGNOSIS — Z79899 Other long term (current) drug therapy: Secondary | ICD-10-CM | POA: Diagnosis not present

## 2017-12-25 LAB — LIPID PANEL
CHOLESTEROL: 168 mg/dL (ref <200)
HDL: 56 mg/dL (ref >50)
LDL Cholesterol (Calc): 95 mg/dL (calc)
Non-HDL Cholesterol (Calc): 112 mg/dL (calc) (ref <130)
TRIGLYCERIDES: 76 mg/dL (ref <150)
Total CHOL/HDL Ratio: 3 (calc) (ref <5.0)

## 2017-12-25 LAB — BASIC METABOLIC PANEL WITH GFR
BUN: 15 mg/dL (ref 7–25)
CALCIUM: 9.4 mg/dL (ref 8.6–10.4)
CO2: 26 mmol/L (ref 20–32)
CREATININE: 0.85 mg/dL (ref 0.60–0.93)
Chloride: 108 mmol/L (ref 98–110)
GFR, EST NON AFRICAN AMERICAN: 67 mL/min/{1.73_m2} (ref >=60)
GFR, Est African American: 78 mL/min/{1.73_m2} (ref >=60)
Glucose, Bld: 101 mg/dL — ABNORMAL HIGH (ref 65–99)
POTASSIUM: 4 mmol/L (ref 3.5–5.3)
Sodium: 140 mmol/L (ref 135–146)

## 2017-12-25 LAB — ALT: ALT: 15 U/L (ref 6–29)

## 2017-12-26 ENCOUNTER — Other Ambulatory Visit: Payer: Self-pay | Admitting: Internal Medicine

## 2017-12-27 ENCOUNTER — Encounter: Payer: Self-pay | Admitting: Internal Medicine

## 2017-12-27 ENCOUNTER — Ambulatory Visit (INDEPENDENT_AMBULATORY_CARE_PROVIDER_SITE_OTHER): Payer: PPO | Admitting: Internal Medicine

## 2017-12-27 VITALS — BP 124/72 | HR 81 | Temp 98.2°F | Ht 63.0 in | Wt 218.0 lb

## 2017-12-27 DIAGNOSIS — K219 Gastro-esophageal reflux disease without esophagitis: Secondary | ICD-10-CM | POA: Diagnosis not present

## 2017-12-27 DIAGNOSIS — E785 Hyperlipidemia, unspecified: Secondary | ICD-10-CM

## 2017-12-27 DIAGNOSIS — Z79899 Other long term (current) drug therapy: Secondary | ICD-10-CM

## 2017-12-27 DIAGNOSIS — R05 Cough: Secondary | ICD-10-CM | POA: Diagnosis not present

## 2017-12-27 DIAGNOSIS — I10 Essential (primary) hypertension: Secondary | ICD-10-CM

## 2017-12-27 DIAGNOSIS — J4520 Mild intermittent asthma, uncomplicated: Secondary | ICD-10-CM | POA: Diagnosis not present

## 2017-12-27 DIAGNOSIS — R053 Chronic cough: Secondary | ICD-10-CM

## 2017-12-27 NOTE — Progress Notes (Signed)
Patient ID: Wendy Figueroa, female   DOB: [birth year], 72 y.o.   MRN: [id number]   Location:  [facility] OFFICE  Provider: DR Arletha Grippe  Code Status:  Goals of Care:  Advanced Directives 08/29/2017  Does Patient Have a Medical Advance Directive? No  Type of Advance Directive -  Does patient want to make changes to medical advance directive? -  Would patient like information on creating a medical advance directive? Yes (MAU/Ambulatory/Procedural Areas - Information given)     Chief Complaint  Patient presents with  . Medical Management of Chronic Issues    4 month follow-up on HTN, chronic cough, PE, Asthma and allergic rhinitis. Discuss labs (copy printed and given to patient)  . Medication Management    Losartan recall, discuss. Patient is still taking   . Advance Care Planning    No ACP on file     HPI: Patient is a 72 y.o. female seen today for medical management of chronic diseases.    She c/o decreased energy. No SOB, CP, palpitations. No easy bruising, melenotic stools, BRBPR. She still has annoying cough. She takes prn tussionex.  No f/c. No HA, dizziness.   Hx dysphagia/chronic cough - last upper esophageal sphincter balloon dilation and suspension microdirect laryngoscopy on 04/18/16 by Dr Rowe Clack at [facility]. Swallowing is unchanged and has some difficulty when she rushes to eat. She gets strangled easily. She saw ENT at [facility] Dr Blenda Nicely and recommended cough suppression tx but no one does that rehab in [place]. She does not want to travel to [facility] for rehab. Takes protonix BID for GERD.  She is able to swallow potassium tabs without difficulty.   HTN - BP stable on on losartan 75 mg daily, procardia XL and lasix  Asthma - no recent flares. stable on HFA prn. She takes spiriva daily, singulair daily and symbicort every other day. No asthma exacerbations/attacks. She also takes xyzal and flonase for allergic rhinitis.  She gets allergy shots weekly.  Followed by pulmonary  Hyperlipidemia - stable; takes pravachol. LDL 95. No myalgias  Arthritis - stable. 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 No hemoptysis. No BRBPR/melena. She will complete tx in Aug 2019.   Past Medical History:  Diagnosis Date  . Abnormal Pap smear    Over 37yr ago  . ALKALINE PHOSPHATASE, ELEVATED 07/01/2009   Qualifier: Diagnosis of  By: NLenna GilfordMD, SDeborra Medina  . Allergic rhinitis   . Anemia   . Arthritis   . Asthma   . Cough    occasional  . Cystocele 11/05/08  . Diverticulosis of colon   . DJD (degenerative joint disease)    no per pt  . Elevated alkaline phosphatase level   . GERD (gastroesophageal reflux disease)   . H/O urinary incontinence 11/06/2008  . H/O varicella   . Hard measles   . Hx of colonic polyps   . Hypercholesterolemia   . Hypertension   . Leg swelling    occasional  . 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 Overweight(278.02)   . Rectocele 11/06/2008   Large  . Sebaceous cyst    on back  . Sinusitis   . Venous insufficiency   . Wheezing    occasional  . Yeast infection     Past Surgical History:  Procedure Laterality Date  . ABDOMINAL HYSTERECTOMY  1988  . ANTERIOR AND POSTERIOR REPAIR  11/05/08  . APOGEE / PERIGEE REPAIR  10/2008   Dr. AOctavio Manns .  bladder tack  2011  . BREAST SURGERY  1978   cyst removed from both removed  . COLONOSCOPY    . ESOPHAGOGASTRODUODENOSCOPY       reports that she has never smoked. She has never used smokeless tobacco. She reports that she drinks about 1.2 oz of alcohol per week. She reports that she does not use drugs. Social History   Socioeconomic History  . Marital status: Married    Spouse name: [name]  . Number of children: 2  . Years of education: Not on file  . Highest education level: Not on file  Occupational History  . Occupation: LPN   Social Needs  . Financial resource strain: Not on file  . Food insecurity:    Worry: Not on file    Inability:  Not on file  . Transportation needs:    Medical: Not on file    Non-medical: Not on file  Tobacco Use  . Smoking status: Never Smoker  . Smokeless tobacco: Never Used  Substance and Sexual Activity  . Alcohol use: Yes    Alcohol/week: 1.2 oz    Types: 2 Cans of beer per week    Comment: 2 beers on the weekend  . Drug use: No  . Sexual activity: Not Currently    Partners: Male    Birth control/protection: None  Lifestyle  . Physical activity:    Days per week: Not on file    Minutes per session: Not on file  . Stress: Not on file  Relationships  . Social connections:    Talks on phone: Not on file    Gets together: Not on file    Attends religious service: Not on file    Active member of club or organization: Not on file    Attends meetings of clubs or organizations: Not on file    Relationship status: Not on file  . Intimate partner violence:    Fear of current or ex partner: Not on file    Emotionally abused: Not on file    Physically abused: Not on file    Forced sexual activity: Not on file  Other Topics Concern  . Not on file  Social History Narrative   Married   Never smoked   Alcohol few beers on week-end   Exercise walk 5 days a week for one hour   No Advance Directive     Family History  Problem Relation Age of Onset  . Aneurysm Mother   . Lung cancer Father   . Hypertension Daughter   . Hypertension Daughter   . Colon cancer Neg Hx   . Esophageal cancer Neg Hx   . Stomach cancer Neg Hx   . Rectal cancer Neg Hx   . Colon polyps Neg Hx     Allergies  Allergen Reactions  . Demerol Hives    All over the body   . Lisinopril-Hydrochlorothiazide Hives    All over the body  . Penicillins Hives    Has patient had a PCN reaction causing immediate rash, facial/tongue/throat swelling, SOB or lightheadedness with hypotension: No Has patient had a PCN reaction causing severe rash involving mucus membranes or skin necrosis: No Has patient had a PCN reaction  that required hospitalization: No Has patient had a PCN reaction occurring within the last 10 years: No If all of the above answers are "NO", then may proceed with Cephalosporin use.   All over the body    Outpatient Encounter Medications as of 12/27/2017  Medication Sig  . albuterol (PROVENTIL HFA;VENTOLIN HFA) 108 (90 Base) MCG/ACT inhaler Inhale 2 puffs into the lungs every 6 (six) hours as needed for wheezing or shortness of breath.  . budesonide-formoterol (SYMBICORT) 160-4.5 MCG/ACT inhaler Inhale 2 puffs into the lungs 2 (two) times daily.  . Calcium Carb-Cholecalciferol (CALCIUM 600 + D PO) Take by mouth 2 (two) times daily. Take one tablet daily   . cetirizine (ZYRTEC) 10 MG tablet Take 10 mg by mouth daily.  . chlorpheniramine-HYDROcodone (TUSSIONEX PENNKINETIC ER) 10-8 MG/5ML SUER Take 5 mLs by mouth every 12 (twelve) hours as needed for cough.  . fluticasone (FLONASE) 50 MCG/ACT nasal spray 2 sprays daily.  . furosemide (LASIX) 40 MG tablet Take one tablet by mouth once daily  . KLOR-CON M20 20 MEQ tablet TAKE 2 TABLETS EVERY DAY FOR POTASSIUM SUPPLEMENT  . levocetirizine (XYZAL) 5 MG tablet Take 1 tablet (5 mg total) by mouth every evening.  Marland Kitchen losartan (COZAAR) 25 MG tablet Take 1 tablet (25 mg total) by mouth daily. Take with 50 mg tablet for a total of 75 mg daily.  Marland Kitchen losartan (COZAAR) 50 MG tablet Take one tablet by mouth once daily along with '25mg'$  for a total of '75mg'$  daily  . montelukast (SINGULAIR) 10 MG tablet Take 10 mg by mouth at bedtime.    . Multiple Vitamins-Minerals (MACULAR VITAMIN BENEFIT PO) Take 1 capsule by mouth 2 (two) times daily.    Marland Kitchen NIFEdipine (PROCARDIA XL/ADALAT-CC) 60 MG 24 hr tablet Take 1 tablet (60 mg total) by mouth daily.  . NON FORMULARY Allergy shots once every other week  . Omega-3 Fatty Acids (FISH OIL PO) Take by mouth daily.  . pantoprazole (PROTONIX) 40 MG tablet TAKE 1 TABLET (40 MG TOTAL) BY MOUTH 2 (TWO) TIMES DAILY. FOR STOMACH  .  pravastatin (PRAVACHOL) 40 MG tablet TAKE 1 TABLET EVERY DAY  . psyllium (METAMUCIL) 58.6 % packet Take 1 packet by mouth daily as needed (constipation).  . ranitidine (ZANTAC) 300 MG tablet Take 300 mg by mouth at bedtime.  Marland Kitchen SPIRIVA HANDIHALER 18 MCG inhalation capsule 18 mcg 2 (two) times daily.  . vitamin B-12 (CYANOCOBALAMIN) 1000 MCG tablet Take 1,000 mcg by mouth daily.  Alveda Reasons 20 MG TABS tablet TAKE 1 TABLET BY MOUTH EVERY DAY  . [DISCONTINUED] budesonide-formoterol (SYMBICORT) 160-4.5 MCG/ACT inhaler Inhale 2 puffs into the lungs 2 (two) times daily.     No facility-administered encounter medications on file as of 12/27/2017.     Review of Systems:  Review of Systems  Constitutional: Positive for fatigue.  HENT: Positive for trouble swallowing.   Respiratory: Positive for cough.   Musculoskeletal: Positive for arthralgias, gait problem and joint swelling.  All other systems reviewed and are negative.   Health Maintenance  Topic Date Due  . Hepatitis C Screening  08/29/2018 (Originally 22-Aug-1946)  . INFLUENZA VACCINE  03/22/2018  . COLONOSCOPY  06/12/2018  . MAMMOGRAM  12/17/2019  . TETANUS/TDAP  08/22/2026  . DEXA SCAN  Completed  . PNA vac Low Risk Adult  Completed    Physical Exam: Vitals:   12/27/17 1041  BP: 124/72  Pulse: 81  Temp: 98.2 F (36.8 C)  TempSrc: Oral  SpO2: 97%  Weight: 218 lb (98.9 kg)  Height: '5\' 3"'$  (1.6 m)   Body mass index is 38.62 kg/m. Physical Exam  Constitutional: She is oriented to person, place, and time. She appears well-developed and well-nourished.  HENT:  Mouth/Throat: Oropharynx is clear and moist. No  oropharyngeal exudate.  MMM; no oral thrush  Eyes: Pupils are equal, round, and reactive to light. No scleral icterus.  Neck: Neck supple. Carotid bruit is not present. No tracheal deviation present. No thyromegaly present.  Cardiovascular: Normal rate, regular rhythm and intact distal pulses. Exam reveals no gallop and no  friction rub.  Murmur (1/6 SEM) heard. +1 pitting LE edema b/l. No calf TTP  Pulmonary/Chest: Effort normal and breath sounds normal. No stridor. No respiratory distress. She has no wheezes. She has no rales.  Abdominal: Soft. Normal appearance and bowel sounds are normal. She exhibits no distension and no mass. There is no hepatomegaly. There is no tenderness. There is no rigidity, no rebound and no guarding. No hernia.  obese  Lymphadenopathy:    She has no cervical adenopathy.  Neurological: She is alert and oriented to person, place, and time. She has normal reflexes.  Skin: Skin is warm and dry. No rash noted.  Psychiatric: She has a normal mood and affect. Her behavior is normal. Judgment and thought content normal.    Labs reviewed: Basic Metabolic Panel: Recent Labs    08/25/17 0856 10/11/17 1221 12/25/17 0900  NA 140 138 140  K 4.0 3.8 4.0  CL 107 103 108  CO2 '26 28 26  '$ GLUCOSE 96 124* 101*  BUN '10 13 15  '$ CREATININE 0.80 0.95 0.85  CALCIUM 9.6 10.1 9.4  TSH  --  1.30  --    Liver Function Tests: Recent Labs    01/18/17 0835 08/25/17 0856 10/11/17 1221 12/25/17 0900  AST '20 21 20  '$ --   ALT '14 14 15 15  '$ ALKPHOS 167*  --  156*  --   BILITOT 0.5 0.5 0.5  --   PROT 7.3 7.3 8.3  --   ALBUMIN 4.1  --  4.2  --    No results for input(s): LIPASE, AMYLASE in the last 8760 hours. No results for input(s): AMMONIA in the last 8760 hours. CBC: Recent Labs    03/29/17 0516 08/25/17 0856 10/11/17 1221  WBC 6.7 5.9 6.1  NEUTROABS  --  2,708 3.2  HGB 10.9* 11.8 12.2  HCT 32.9* 36.8 37.6  MCV 77.8* 79.0* 81.6  PLT 210 278 289.0   Lipid Panel: Recent Labs    01/18/17 0835 08/25/17 0856 12/25/17 0900  CHOL 175 169 168  HDL 65 63 56  LDLCALC 95 89 95  TRIG 76 83 76  CHOLHDL 2.7 2.7 3.0   Lab Results  Component Value Date   HGBA1C 5.6 05/26/2014    Procedures since last visit: No results found.  Assessment/Plan   ICD-10-CM   1. Chronic cough R05     2. 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 Gastroesophageal reflux disease, esophagitis presence not specified K21.9   4. Mild intermittent asthma, unspecified whether complicated R42.70   5. Essential hypertension I10 CMP with eGFR([facility])  6. Hyperlipidemia LDL goal <130 E78.5 Lipid Panel  7. High risk medication use Z79.899 CMP with eGFR([facility])      Continue current medications as ordered  Follow up with specialists as scheduled  Follow up in 4 mos for cholesterol, PE, HTN, asthma, chronic cough. Fasting labs prior to appt   [place] S. Perlie Gold  [facility] [address] [place], [place] [address] [phone] Cell (Monday-Friday 8 AM - 5 PM) [phone] After 5 PM and follow prompts

## 2017-12-27 NOTE — Patient Instructions (Addendum)
Continue current medications as ordered  Follow up with specialists as scheduled  Follow up in 4 mos for cholesterol, PE, HTN, asthma, chronic cough. Fasting labs prior to appt

## 2017-12-28 ENCOUNTER — Encounter: Payer: Self-pay | Admitting: Internal Medicine

## 2018-01-05 DIAGNOSIS — J3081 Allergic rhinitis due to animal (cat) (dog) hair and dander: Secondary | ICD-10-CM | POA: Diagnosis not present

## 2018-01-05 DIAGNOSIS — J301 Allergic rhinitis due to pollen: Secondary | ICD-10-CM | POA: Diagnosis not present

## 2018-01-05 DIAGNOSIS — J3089 Other allergic rhinitis: Secondary | ICD-10-CM | POA: Diagnosis not present

## 2018-01-10 ENCOUNTER — Other Ambulatory Visit: Payer: Self-pay | Admitting: Internal Medicine

## 2018-01-12 DIAGNOSIS — Z01419 Encounter for gynecological examination (general) (routine) without abnormal findings: Secondary | ICD-10-CM | POA: Diagnosis not present

## 2018-01-12 DIAGNOSIS — Z6839 Body mass index (BMI) 39.0-39.9, adult: Secondary | ICD-10-CM | POA: Diagnosis not present

## 2018-01-12 DIAGNOSIS — R350 Frequency of micturition: Secondary | ICD-10-CM | POA: Diagnosis not present

## 2018-01-19 DIAGNOSIS — J3089 Other allergic rhinitis: Secondary | ICD-10-CM | POA: Diagnosis not present

## 2018-01-19 DIAGNOSIS — J3081 Allergic rhinitis due to animal (cat) (dog) hair and dander: Secondary | ICD-10-CM | POA: Diagnosis not present

## 2018-01-19 DIAGNOSIS — J301 Allergic rhinitis due to pollen: Secondary | ICD-10-CM | POA: Diagnosis not present

## 2018-02-02 DIAGNOSIS — J3089 Other allergic rhinitis: Secondary | ICD-10-CM | POA: Diagnosis not present

## 2018-02-02 DIAGNOSIS — J301 Allergic rhinitis due to pollen: Secondary | ICD-10-CM | POA: Diagnosis not present

## 2018-02-07 ENCOUNTER — Encounter: Payer: Self-pay | Admitting: Podiatry

## 2018-02-07 ENCOUNTER — Ambulatory Visit: Payer: PPO | Admitting: Podiatry

## 2018-02-07 DIAGNOSIS — B351 Tinea unguium: Secondary | ICD-10-CM | POA: Diagnosis not present

## 2018-02-07 DIAGNOSIS — M79674 Pain in right toe(s): Secondary | ICD-10-CM

## 2018-02-07 DIAGNOSIS — M79675 Pain in left toe(s): Secondary | ICD-10-CM

## 2018-02-07 NOTE — Progress Notes (Signed)
Complaint:  Visit Type: Patient returns to my office for continued preventative foot care services. Complaint: Patient states" my nails have grown long and thick and become painful to walk and wear shoes" . The patient presents for preventative foot care services. No changes to ROS  Podiatric Exam: Vascular: dorsalis pedis and posterior tibial pulses are palpable bilateral. Capillary return is immediate. Temperature gradient is WNL. Skin turgor WNL  Sensorium: Normal Semmes Weinstein monofilament test. Normal tactile sensation bilaterally. Nail Exam: Pt has thick disfigured discolored nails with subungual debris noted bilateral entire nail hallux through fifth toenails Ulcer Exam: There is no evidence of ulcer or pre-ulcerative changes or infection. Orthopedic Exam: Muscle tone and strength are WNL. No limitations in general ROM. No crepitus or effusions noted. Foot type and digits show no abnormalities. HAV with hammer toe  B/L. Skin: No Porokeratosis. No infection or ulcers  Diagnosis:  Onychomycosis, , Pain in right toe, pain in left toes  Treatment & Plan Procedures and Treatment: Consent by patient was obtained for treatment procedures.   Debridement of mycotic and hypertrophic toenails, 1 through 5 bilateral and clearing of subungual debris. No ulceration, no infection noted.  Return Visit-Office Procedure: Patient instructed to return to the office for a follow up visit 3 months for continued evaluation and treatment.    Gaylen Venning DPM 

## 2018-02-10 ENCOUNTER — Other Ambulatory Visit: Payer: Self-pay | Admitting: Internal Medicine

## 2018-02-16 DIAGNOSIS — J3081 Allergic rhinitis due to animal (cat) (dog) hair and dander: Secondary | ICD-10-CM | POA: Diagnosis not present

## 2018-02-16 DIAGNOSIS — J3089 Other allergic rhinitis: Secondary | ICD-10-CM | POA: Diagnosis not present

## 2018-02-16 DIAGNOSIS — J301 Allergic rhinitis due to pollen: Secondary | ICD-10-CM | POA: Diagnosis not present

## 2018-02-17 IMAGING — CR DG CHEST 2V
2 series · 2 of 2 positions shown · non-contrast
Comparison: 01/08/2014

CLINICAL DATA: Shortness of breath.  Asthma.

EXAM:
CHEST  2 VIEW

[w chest pa]
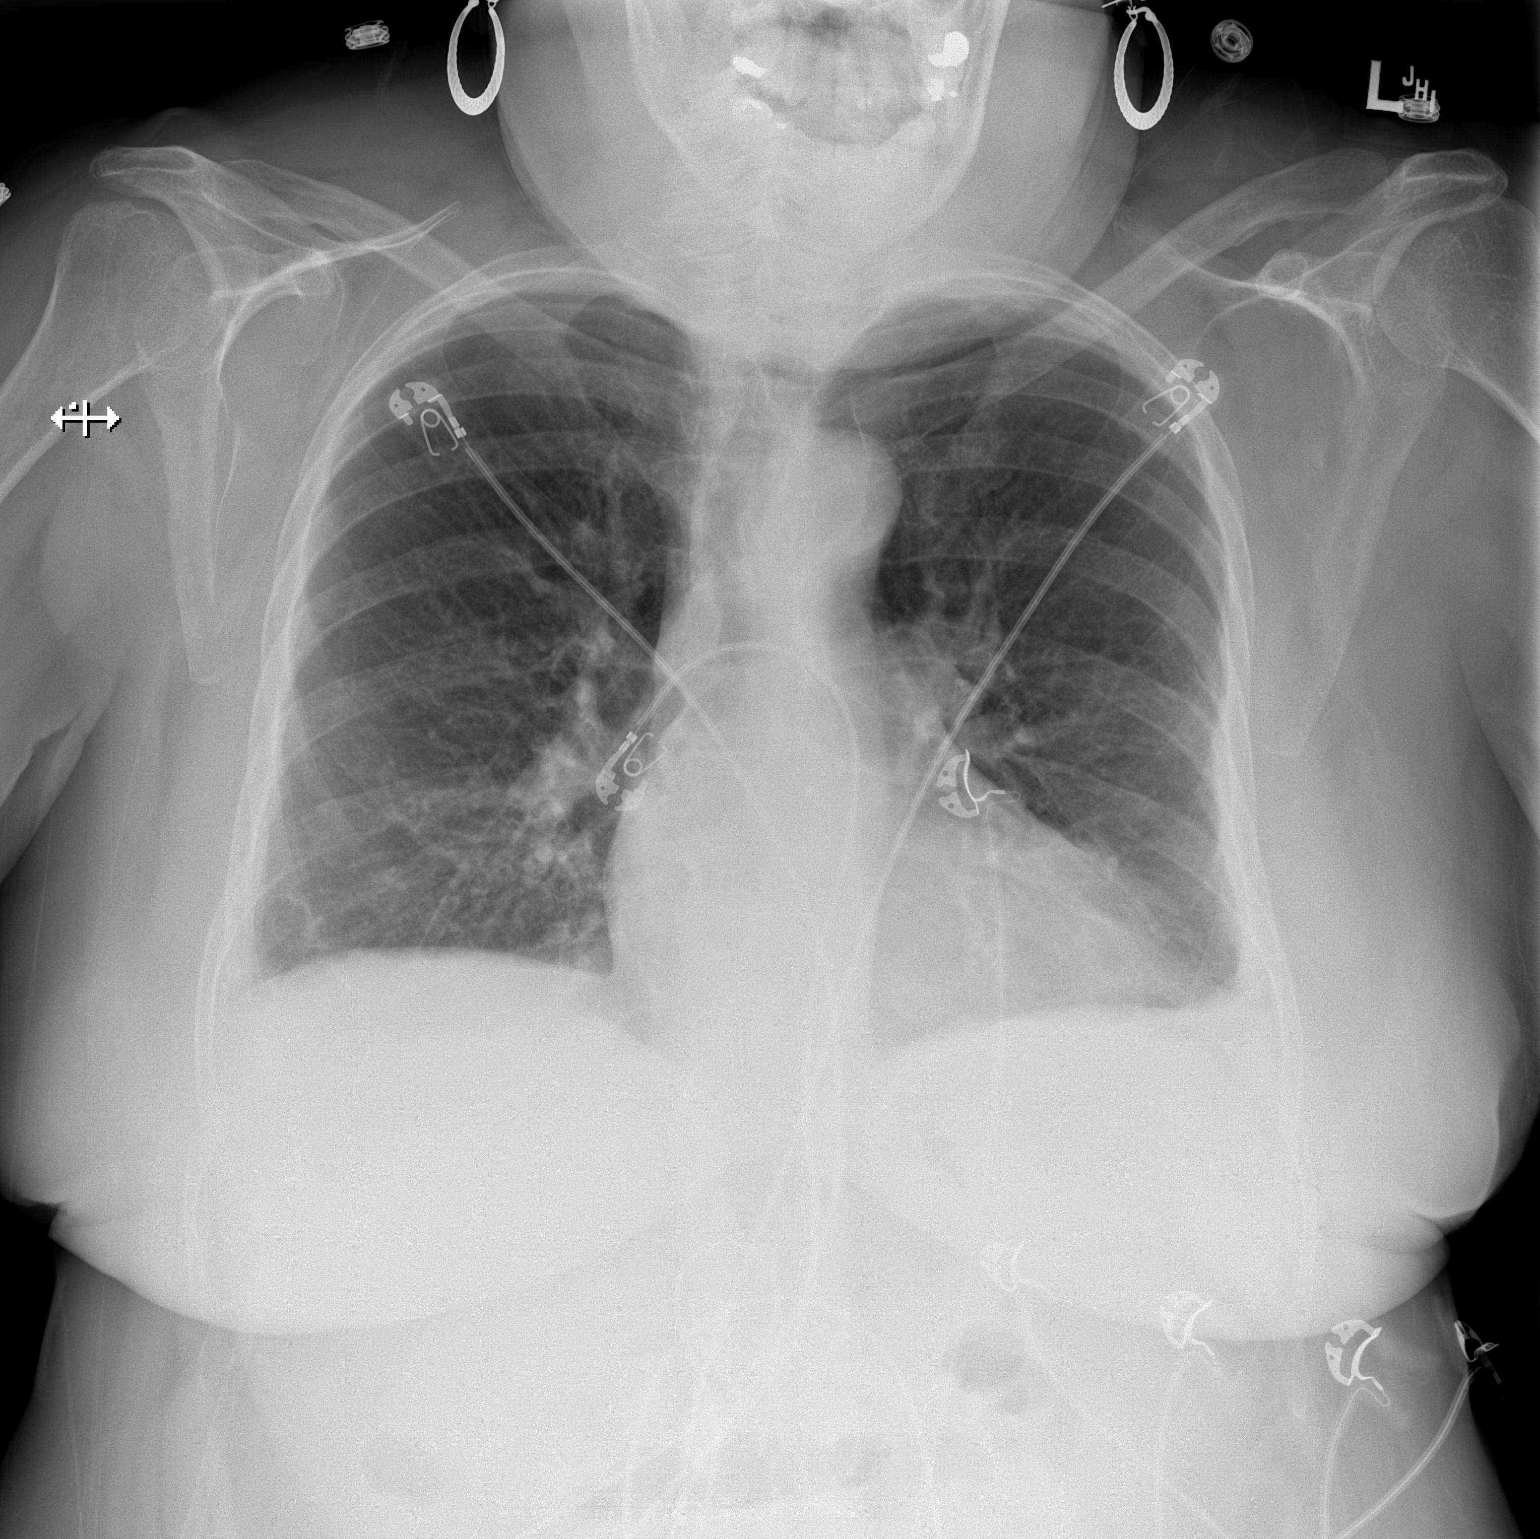

[w chest lat]
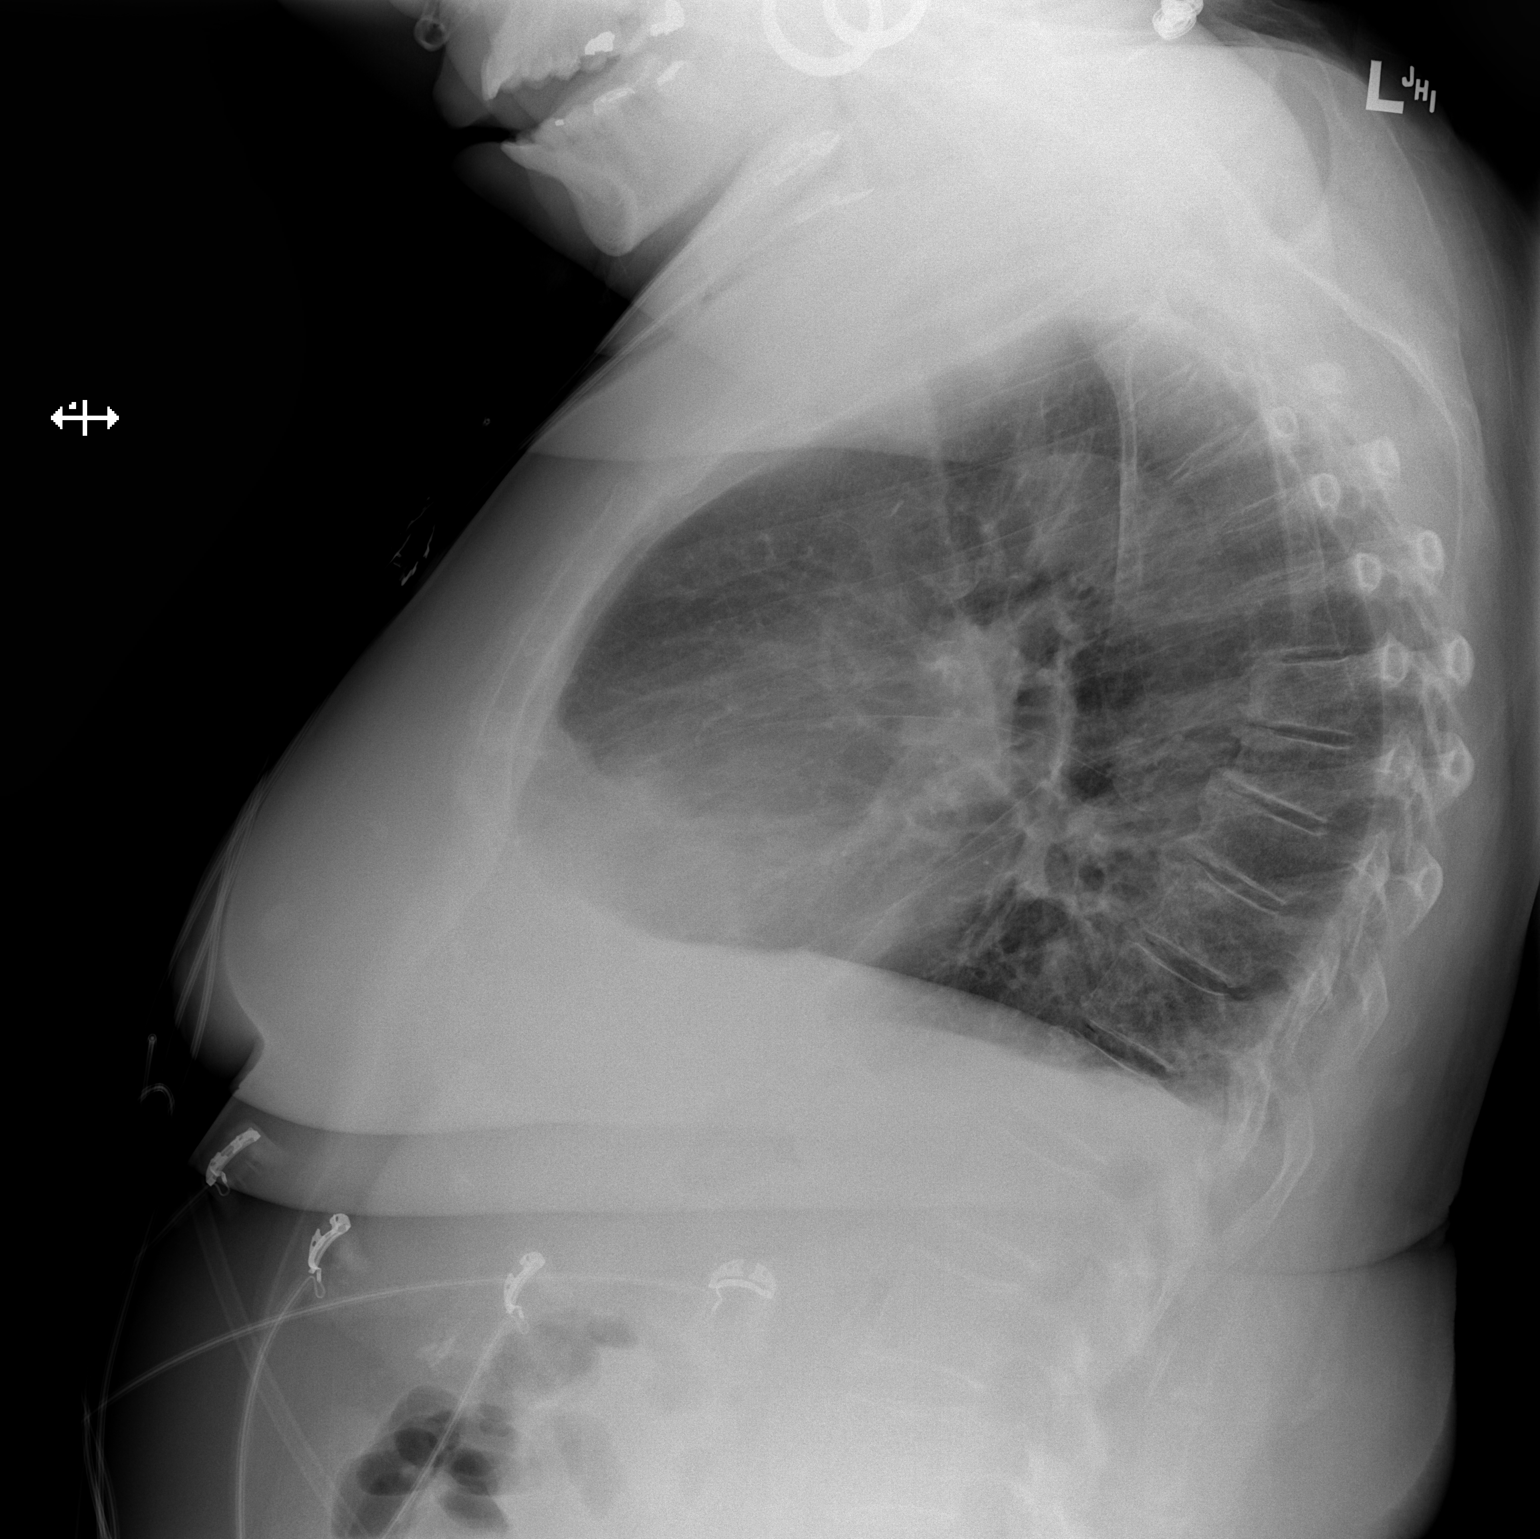

[2 of 2 positions shown; findings below may reference images not displayed]

FINDINGS: The heart size and mediastinal contours are within normal limits.
Stable mild pleural thickening in lower left hemithorax. No evidence
of pulmonary infiltrate or edema. No evidence of pleural effusion.
IMPRESSION: No active cardiopulmonary disease.

## 2018-02-17 IMAGING — CT CT ANGIO CHEST
2 of 6 series · 18 of 36 positions shown · IV contrast (isovue)
Comparison: None.

CLINICAL DATA: Asthma, short of breath.

EXAM:
CT ANGIOGRAPHY CHEST WITH CONTRAST
TECHNIQUE: Multidetector CT imaging of the chest was performed using the
standard protocol during bolus administration of intravenous
contrast. Multiplanar CT image reconstructions and MIPs were
obtained to evaluate the vascular anatomy.
CONTRAST:  100 mL Isovue

[Series 5: coronal mpr · coronal · 0.42mm/px · 1 of 151 slices shown]
[im 76/151  mediastinal]
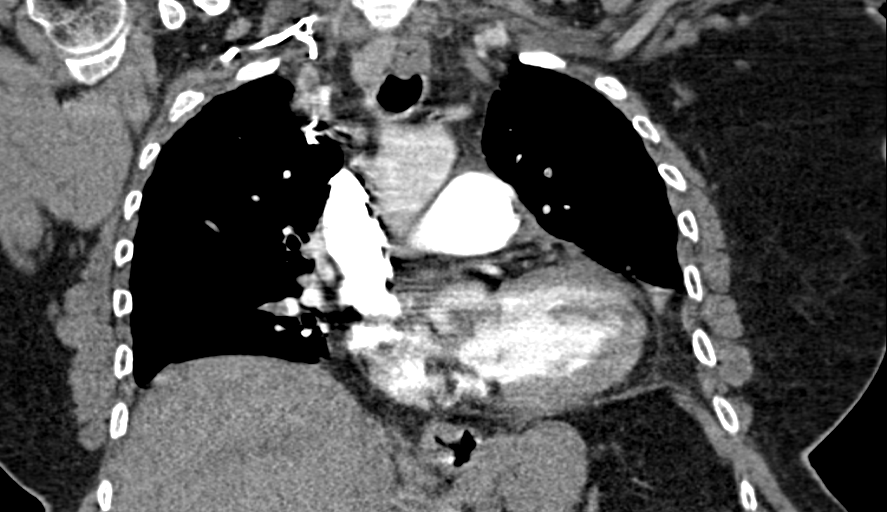

[Series 10: thins for pacs · axial · 0.74mm/px · z∈[+155,+350]mm · 17 of 217 slices shown]
[im 11/217  lung]
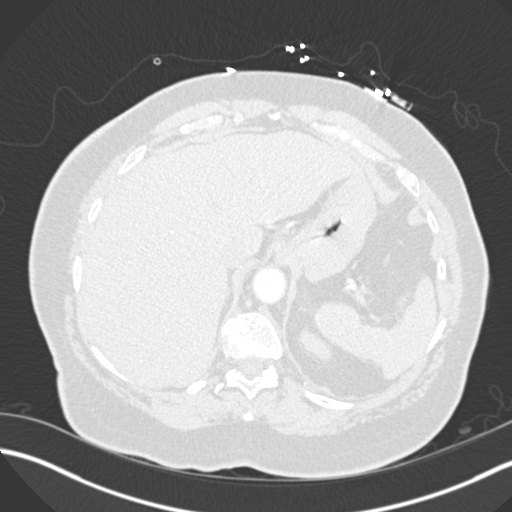
[im 22/217  mediastinal]
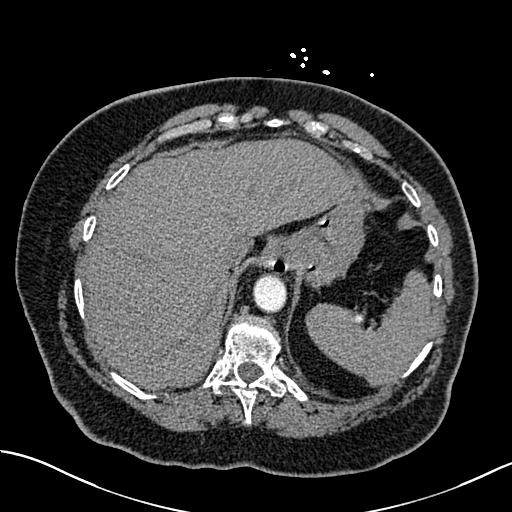
[im 33/217  lung]
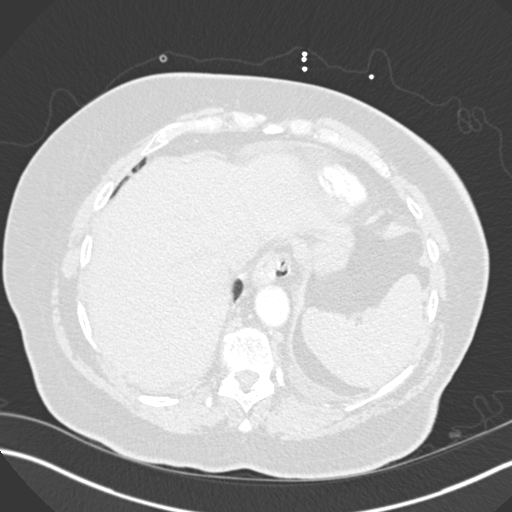
[im 44/217  mediastinal]
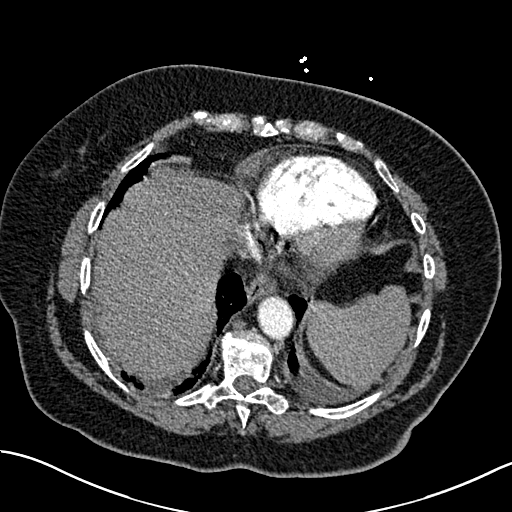
[im 65/217  lung]
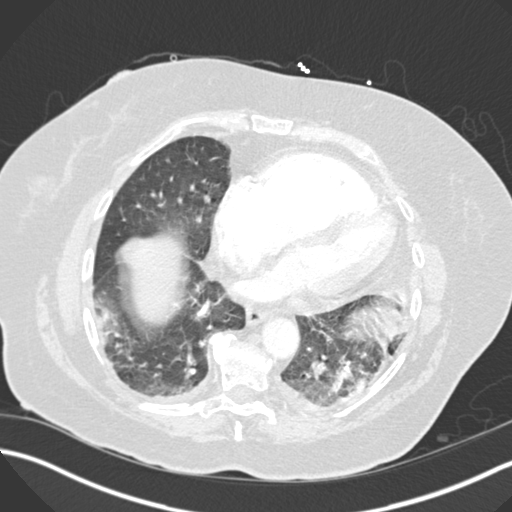
[im 76/217  mediastinal]
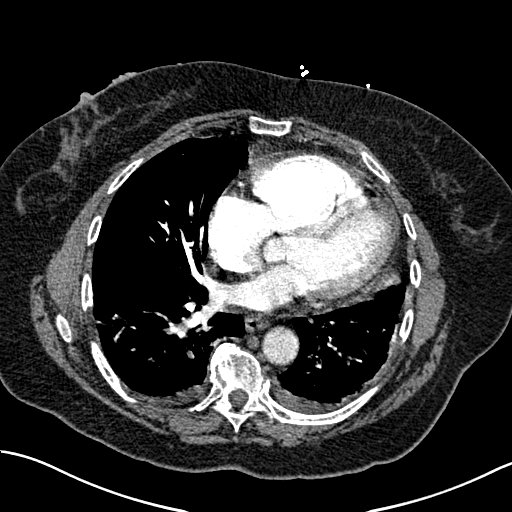
[im 87/217  lung]
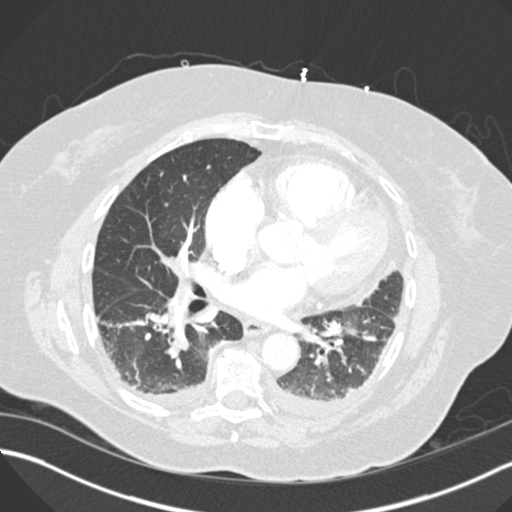
[im 98/217  mediastinal]
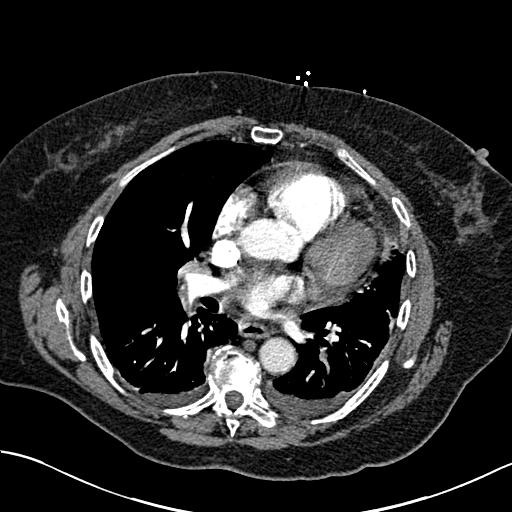
[im 109/217  lung]
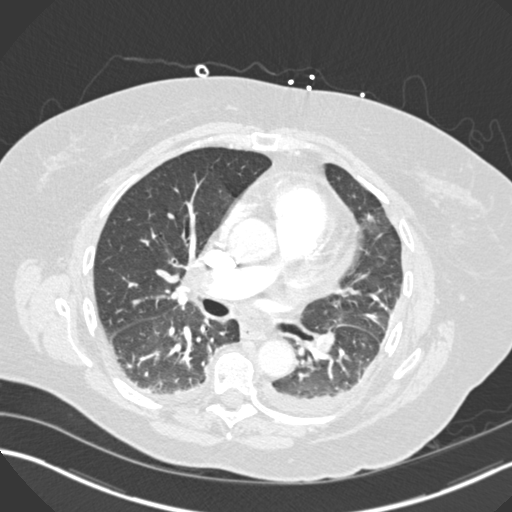
[im 119/217  mediastinal]
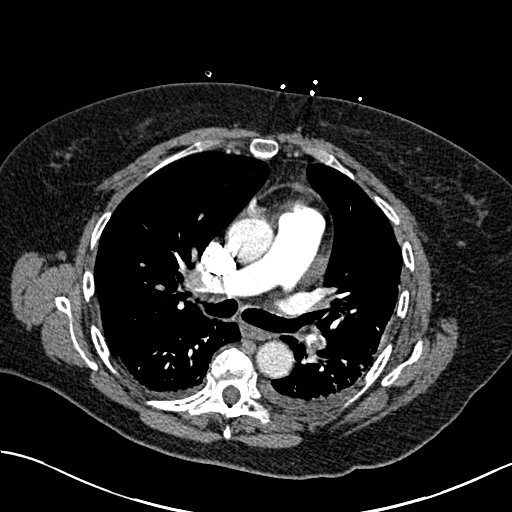
[im 130/217  lung]
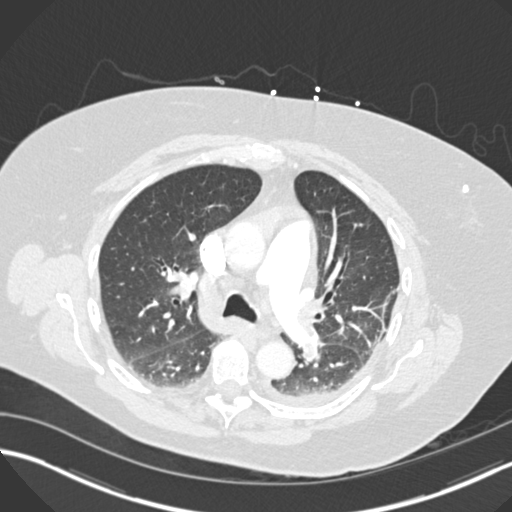
[im 141/217  mediastinal]
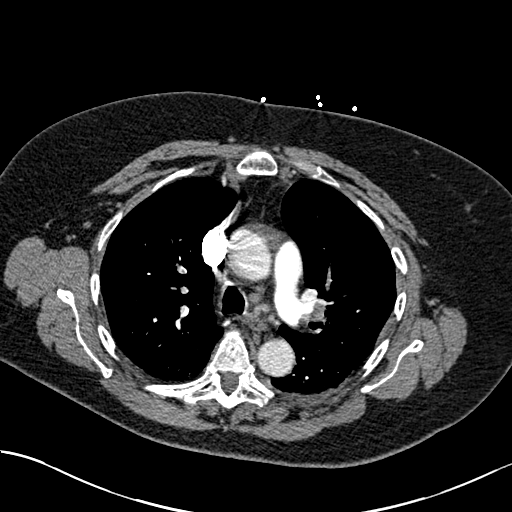
[im 152/217  lung]
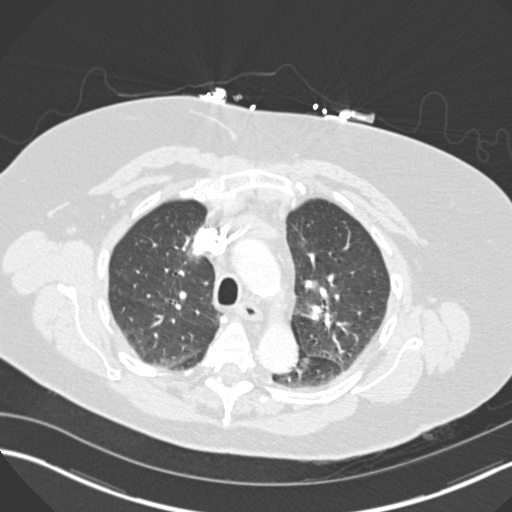
[im 173/217  mediastinal]
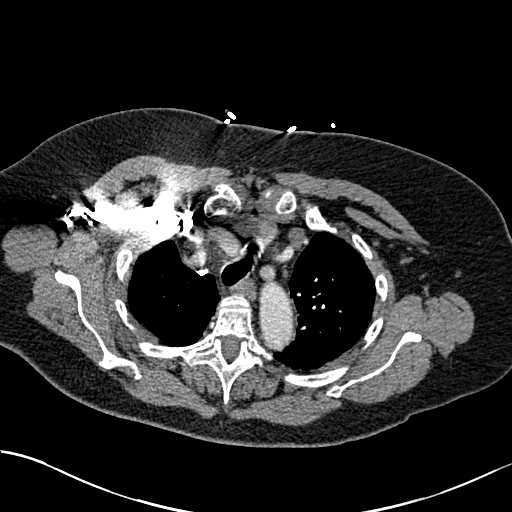
[im 184/217  lung]
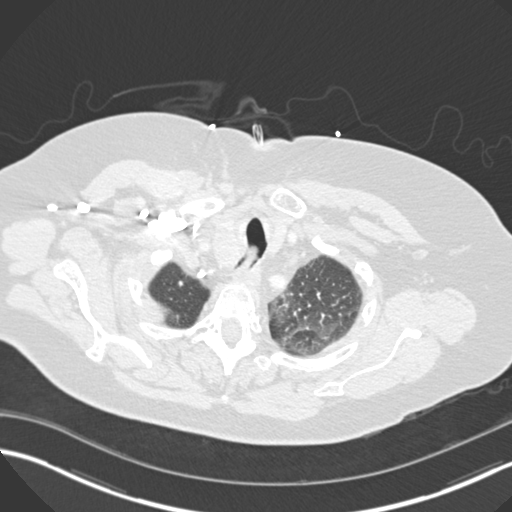
[im 195/217  mediastinal]
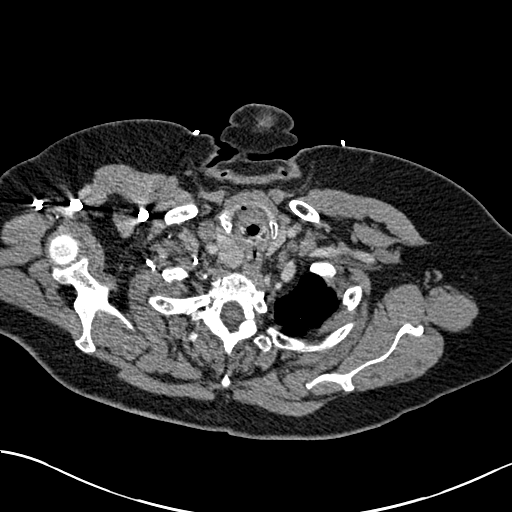
[im 206/217  lung]
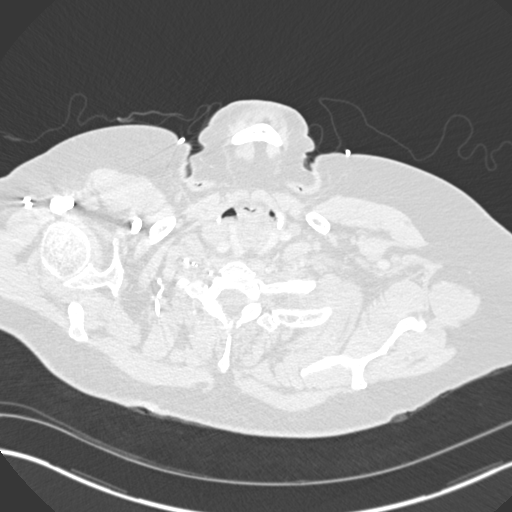

[18 of 36 positions shown; findings below may reference images not displayed]

FINDINGS: Cardiovascular: Bilateral tubular filling defects within the
proximal RIGHT lower lobe pulmonary artery, LEFT lobe pulmonary
artery, RIGHT upper lobe pulmonary arteries, and LEFT upper lobe
pulmonary arteries. The clot burden is severe and partially
occlusive to occlusive.

There is evidence of RIGHT ventricular strain with the RIGHT
ventricular to LEFT ventricular ratio > 1 (1.8)

Mediastinum/Nodes: No axillary or supraclavicular adenopathy. No
mediastinal hilar adenopathy. No pericardial fluid

Lungs/Pleura: Bilateral small pleural effusions. Small focus of
consolidation in the RIGHT lower lobe measuring 20 mm could
represent small infarction. No pneumothorax

Upper Abdomen: Limited view of the liver, kidneys, pancreas are
unremarkable. Normal adrenal glands.

Musculoskeletal: No aggressive osseous lesion

Review of the MIP images confirms the above findings.
IMPRESSION: 1. Severe bilateral pulmonary emboli involving the proximal
pulmonary arteries of all of the LEFT and RIGHT lung lobes.
2. CT evidence of right heart strain (RV/LV Ratio = 1.8) consistent
with at least submassive (intermediate risk) PE. The presence of
right heart strain has been associated with an increased risk of
morbidity and mortality. Please activate Code PE by paging
[phone].
3. Mild basilar effusion. Potential small infarction at the RIGHT
lung base.
Critical Value/emergent results were called by telephone at the time
of interpretation on 03/26/2017 at [DATE] to Dr. BANECKY BUFFEL , who
verbally acknowledged these results.

## 2018-02-21 DIAGNOSIS — H40023 Open angle with borderline findings, high risk, bilateral: Secondary | ICD-10-CM | POA: Diagnosis not present

## 2018-02-21 DIAGNOSIS — H04123 Dry eye syndrome of bilateral lacrimal glands: Secondary | ICD-10-CM | POA: Diagnosis not present

## 2018-02-21 DIAGNOSIS — Z8679 Personal history of other diseases of the circulatory system: Secondary | ICD-10-CM | POA: Diagnosis not present

## 2018-02-26 ENCOUNTER — Telehealth: Payer: Self-pay

## 2018-02-26 DIAGNOSIS — R05 Cough: Secondary | ICD-10-CM

## 2018-02-26 DIAGNOSIS — R059 Cough, unspecified: Secondary | ICD-10-CM

## 2018-02-26 DIAGNOSIS — R053 Chronic cough: Secondary | ICD-10-CM

## 2018-02-26 MED ORDER — HYDROCOD POLST-CPM POLST ER 10-8 MG/5ML PO SUER: 5.0000 mL | Freq: Two times a day (BID) | ORAL | 0 refills | Status: DC | PRN

## 2018-02-26 NOTE — Telephone Encounter (Signed)
Rx already sent to the pharmacy. Please review

## 2018-02-26 NOTE — Telephone Encounter (Signed)
Message left on clinical intake voicemail:   Patient would like a refill on Tussinex for chronic cough   Side Notes: Last OV 12/27/17 with Dr.Carter and patient is under the care of pulmonary Dr.Nadel. Patient is also on a medication Losartan that has a side effect of cough  Please advise

## 2018-02-26 NOTE — Telephone Encounter (Signed)
RX will need to be sent electronically by Dr.Carter, RX can not be called in.

## 2018-02-26 NOTE — Telephone Encounter (Signed)
I tried to call patient x 2 to inform her rx called in, no voicemail, I will try to call patient again later

## 2018-02-26 NOTE — Telephone Encounter (Signed)
Ok to [employer]; just FYI, have tried stopping ARB/ACEI but no effect on cough; tussionex is a chronic medication

## 2018-02-26 NOTE — Telephone Encounter (Signed)
Patient left message on clinical intake indicating that she is having some phone issues and to return call @ [phone]  Spoke with patient, patient aware rx sent to pharmacy

## 2018-03-09 DIAGNOSIS — J301 Allergic rhinitis due to pollen: Secondary | ICD-10-CM | POA: Diagnosis not present

## 2018-03-09 DIAGNOSIS — J3081 Allergic rhinitis due to animal (cat) (dog) hair and dander: Secondary | ICD-10-CM | POA: Diagnosis not present

## 2018-03-09 DIAGNOSIS — J3089 Other allergic rhinitis: Secondary | ICD-10-CM | POA: Diagnosis not present

## 2018-03-23 DIAGNOSIS — J3081 Allergic rhinitis due to animal (cat) (dog) hair and dander: Secondary | ICD-10-CM | POA: Diagnosis not present

## 2018-03-23 DIAGNOSIS — J301 Allergic rhinitis due to pollen: Secondary | ICD-10-CM | POA: Diagnosis not present

## 2018-03-23 DIAGNOSIS — J3089 Other allergic rhinitis: Secondary | ICD-10-CM | POA: Diagnosis not present

## 2018-03-27 DIAGNOSIS — J3081 Allergic rhinitis due to animal (cat) (dog) hair and dander: Secondary | ICD-10-CM | POA: Diagnosis not present

## 2018-03-27 DIAGNOSIS — J301 Allergic rhinitis due to pollen: Secondary | ICD-10-CM | POA: Diagnosis not present

## 2018-03-27 DIAGNOSIS — J3089 Other allergic rhinitis: Secondary | ICD-10-CM | POA: Diagnosis not present

## 2018-04-03 ENCOUNTER — Other Ambulatory Visit: Payer: Self-pay | Admitting: Internal Medicine

## 2018-04-06 DIAGNOSIS — J301 Allergic rhinitis due to pollen: Secondary | ICD-10-CM | POA: Diagnosis not present

## 2018-04-06 DIAGNOSIS — J3089 Other allergic rhinitis: Secondary | ICD-10-CM | POA: Diagnosis not present

## 2018-04-06 DIAGNOSIS — J3081 Allergic rhinitis due to animal (cat) (dog) hair and dander: Secondary | ICD-10-CM | POA: Diagnosis not present

## 2018-04-11 ENCOUNTER — Encounter: Payer: Self-pay | Admitting: Internal Medicine

## 2018-04-11 DIAGNOSIS — J3081 Allergic rhinitis due to animal (cat) (dog) hair and dander: Secondary | ICD-10-CM | POA: Diagnosis not present

## 2018-04-11 DIAGNOSIS — J301 Allergic rhinitis due to pollen: Secondary | ICD-10-CM | POA: Diagnosis not present

## 2018-04-11 DIAGNOSIS — J3089 Other allergic rhinitis: Secondary | ICD-10-CM | POA: Diagnosis not present

## 2018-04-13 DIAGNOSIS — J301 Allergic rhinitis due to pollen: Secondary | ICD-10-CM | POA: Diagnosis not present

## 2018-04-13 DIAGNOSIS — J3081 Allergic rhinitis due to animal (cat) (dog) hair and dander: Secondary | ICD-10-CM | POA: Diagnosis not present

## 2018-04-13 DIAGNOSIS — J3089 Other allergic rhinitis: Secondary | ICD-10-CM | POA: Diagnosis not present

## 2018-04-17 DIAGNOSIS — J301 Allergic rhinitis due to pollen: Secondary | ICD-10-CM | POA: Diagnosis not present

## 2018-04-17 DIAGNOSIS — J3081 Allergic rhinitis due to animal (cat) (dog) hair and dander: Secondary | ICD-10-CM | POA: Diagnosis not present

## 2018-04-17 DIAGNOSIS — J3089 Other allergic rhinitis: Secondary | ICD-10-CM | POA: Diagnosis not present

## 2018-04-20 DIAGNOSIS — J3089 Other allergic rhinitis: Secondary | ICD-10-CM | POA: Diagnosis not present

## 2018-04-20 DIAGNOSIS — J3081 Allergic rhinitis due to animal (cat) (dog) hair and dander: Secondary | ICD-10-CM | POA: Diagnosis not present

## 2018-04-20 DIAGNOSIS — J301 Allergic rhinitis due to pollen: Secondary | ICD-10-CM | POA: Diagnosis not present

## 2018-04-25 ENCOUNTER — Telehealth: Payer: Self-pay | Admitting: Pulmonary Disease

## 2018-04-25 DIAGNOSIS — J4520 Mild intermittent asthma, uncomplicated: Secondary | ICD-10-CM

## 2018-04-25 MED ORDER — BUDESONIDE-FORMOTEROL FUMARATE 160-4.5 MCG/ACT IN AERO: 2.0000 | INHALATION_SPRAY | Freq: Two times a day (BID) | RESPIRATORY_TRACT | 0 refills | Status: DC

## 2018-04-25 NOTE — Telephone Encounter (Signed)
Spoke with patient, patient requesting Symbicort samples. Samples obtained and placed upfront for pick up. Patient voiced understanding. Nothing further needed at this time.

## 2018-04-27 DIAGNOSIS — J3081 Allergic rhinitis due to animal (cat) (dog) hair and dander: Secondary | ICD-10-CM | POA: Diagnosis not present

## 2018-04-27 DIAGNOSIS — J3089 Other allergic rhinitis: Secondary | ICD-10-CM | POA: Diagnosis not present

## 2018-04-27 DIAGNOSIS — J301 Allergic rhinitis due to pollen: Secondary | ICD-10-CM | POA: Diagnosis not present

## 2018-04-30 ENCOUNTER — Ambulatory Visit (INDEPENDENT_AMBULATORY_CARE_PROVIDER_SITE_OTHER): Payer: PPO

## 2018-04-30 ENCOUNTER — Other Ambulatory Visit: Payer: PPO

## 2018-04-30 VITALS — BP 158/74 | HR 80 | Temp 97.9°F | Ht 63.0 in | Wt 216.0 lb

## 2018-04-30 DIAGNOSIS — Z0000 Encounter for general adult medical examination without abnormal findings: Secondary | ICD-10-CM

## 2018-04-30 DIAGNOSIS — Z79899 Other long term (current) drug therapy: Secondary | ICD-10-CM | POA: Diagnosis not present

## 2018-04-30 DIAGNOSIS — Z23 Encounter for immunization: Secondary | ICD-10-CM

## 2018-04-30 DIAGNOSIS — I10 Essential (primary) hypertension: Secondary | ICD-10-CM | POA: Diagnosis not present

## 2018-04-30 DIAGNOSIS — E785 Hyperlipidemia, unspecified: Secondary | ICD-10-CM | POA: Diagnosis not present

## 2018-04-30 LAB — COMPLETE METABOLIC PANEL WITH GFR
AG RATIO: 1.3 (calc) (ref 1.0–2.5)
ALT: 13 U/L (ref 6–29)
AST: 23 U/L (ref 10–35)
Albumin: 3.9 g/dL (ref 3.6–5.1)
Alkaline phosphatase (APISO): 160 U/L — ABNORMAL HIGH (ref 33–130)
BUN/Creatinine Ratio: 14 (calc) (ref 6–22)
BUN: 13 mg/dL (ref 7–25)
CALCIUM: 9.6 mg/dL (ref 8.6–10.4)
CO2: 27 mmol/L (ref 20–32)
CREATININE: 0.94 mg/dL — AB (ref 0.60–0.93)
Chloride: 107 mmol/L (ref 98–110)
GFR, EST AFRICAN AMERICAN: 69 mL/min/{1.73_m2} (ref >=60)
GFR, Est Non African American: 60 mL/min/{1.73_m2} (ref >=60)
Globulin: 3.1 g/dL (calc) (ref 1.9–3.7)
Glucose, Bld: 85 mg/dL (ref 65–99)
Potassium: 4 mmol/L (ref 3.5–5.3)
Sodium: 142 mmol/L (ref 135–146)
TOTAL PROTEIN: 7 g/dL (ref 6.1–8.1)
Total Bilirubin: 0.4 mg/dL (ref 0.2–1.2)

## 2018-04-30 LAB — LIPID PANEL
CHOL/HDL RATIO: 3.1 (calc) (ref <5.0)
Cholesterol: 160 mg/dL (ref <200)
HDL: 52 mg/dL (ref >50)
LDL Cholesterol (Calc): 89 mg/dL (calc)
NON-HDL CHOLESTEROL (CALC): 108 mg/dL (ref <130)
Triglycerides: 99 mg/dL (ref <150)

## 2018-04-30 NOTE — Progress Notes (Signed)
Subjective:   Wendy Figueroa is a 72 y.o. female who presents for Medicare Annual (Subsequent) preventive examination.  Last AWV-01/18/2017    Objective:     Vitals: BP (!) 158/74 (BP Location: Left Arm, Patient Position: Sitting)   Pulse 80   Temp 97.9 F (36.6 C) (Oral)   Ht 5\' 3"  (1.6 m)   Wt 216 lb (98 kg)   SpO2 98%   BMI 38.26 kg/m   Body mass index is 38.26 kg/m.  Patient did not take BP medication this morning  Advanced Directives 04/30/2018 08/29/2017 05/17/2017 04/11/2017 03/26/2017 03/26/2017 01/18/2017  Does Patient Have a Medical Advance Directive? No No Yes Yes Yes No;Yes No  Type of Advance Directive - - Living will Living will Living will - -  Does patient want to make changes to medical advance directive? - - No - Patient declined No - Patient declined No - Patient declined - Yes (MAU/Ambulatory/Procedural Areas - Information given)  Would patient like information on creating a medical advance directive? Yes (MAU/Ambulatory/Procedural Areas - Information given) Yes (MAU/Ambulatory/Procedural Areas - Information given) - - - - -    Tobacco Social History   Tobacco Use  Smoking Status Never Smoker  Smokeless Tobacco Never Used     Counseling given: Not Answered   Clinical Intake:  Pre-visit preparation completed: No  Pain : No/denies pain     Nutritional Risks: None Diabetes: No  How often do you need to have someone help you when you read instructions, pamphlets, or other written materials from your doctor or pharmacy?: 1 - Never What is the last grade level you completed in school?: [employer]  Interpreter Needed?: No  Information entered by :: Tyson Dense, RN  Past Medical History:  Diagnosis Date  . Abnormal Pap smear    Over 68yrs ago  . ALKALINE PHOSPHATASE, ELEVATED 07/01/2009   Qualifier: Diagnosis of  By: [name] MD, [name]   . Allergic rhinitis   . Anemia   . Arthritis   . Asthma   . Cough    occasional  . Cystocele 11/05/08  .  Diverticulosis of colon   . DJD (degenerative joint disease)    no per pt  . Elevated alkaline phosphatase level   . GERD (gastroesophageal reflux disease)   . H/O urinary incontinence 11/06/2008  . H/O varicella   . Hard measles   . Hx of colonic polyps   . Hypercholesterolemia   . Hypertension   . Leg swelling    occasional  . 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 Overweight(278.02)   . Rectocele 11/06/2008   Large  . Sebaceous cyst    on back  . Sinusitis   . Venous insufficiency   . Wheezing    occasional  . Yeast infection    Past Surgical History:  Procedure Laterality Date  . ABDOMINAL HYSTERECTOMY  1988  . ANTERIOR AND POSTERIOR REPAIR  11/05/08  . APOGEE / PERIGEE REPAIR  10/2008   Dr. Octavio Manns  . bladder tack  2011  . BREAST SURGERY  1978   cyst removed from both removed  . COLONOSCOPY    . ESOPHAGOGASTRODUODENOSCOPY     Family History  Problem Relation Age of Onset  . Aneurysm Mother   . Lung cancer Father   . Hypertension Daughter   . Hypertension Daughter   . Colon cancer Neg Hx   . Esophageal cancer Neg Hx   . Stomach cancer Neg Hx   . Rectal  cancer Neg Hx   . Colon polyps Neg Hx    Social History   Socioeconomic History  . Marital status: Married    Spouse name: [name]  . Number of children: 2  . Years of education: Not on file  . Highest education level: Not on file  Occupational History  . Occupation: LPN   Social Needs  . Financial resource strain: Not hard at all  . Food insecurity:    Worry: Never true    Inability: Never true  . Transportation needs:    Medical: No    Non-medical: No  Tobacco Use  . Smoking status: Never Smoker  . Smokeless tobacco: Never Used  Substance and Sexual Activity  . Alcohol use: Yes    Alcohol/week: 2.0 standard drinks    Types: 2 Cans of beer per week    Comment: 2 beers on the weekend  . Drug use: No  . Sexual activity: Not Currently    Partners: Male    Birth  control/protection: None  Lifestyle  . Physical activity:    Days per week: 0 days    Minutes per session: 0 min  . Stress: Only a little  Relationships  . Social connections:    Talks on phone: More than three times a week    Gets together: More than three times a week    Attends religious service: More than 4 times per year    Active member of club or organization: Yes    Attends meetings of clubs or organizations: More than 4 times per year    Relationship status: Married  Other Topics Concern  . Not on file  Social History Narrative   Married   Never smoked   Alcohol few beers on week-end   No Advance Directive     Outpatient Encounter Medications as of 04/30/2018  Medication Sig  . albuterol (PROVENTIL HFA;VENTOLIN HFA) 108 (90 Base) MCG/ACT inhaler Inhale 2 puffs into the lungs every 6 (six) hours as needed for wheezing or shortness of breath.  . budesonide-formoterol (SYMBICORT) 160-4.5 MCG/ACT inhaler Inhale 2 puffs into the lungs 2 (two) times daily.  . Calcium Carb-Cholecalciferol (CALCIUM 600 + D PO) Take by mouth 2 (two) times daily. Take one tablet daily   . cetirizine (ZYRTEC) 10 MG tablet Take 10 mg by mouth daily.  . chlorpheniramine-HYDROcodone (TUSSIONEX PENNKINETIC ER) 10-8 MG/5ML SUER Take 5 mLs by mouth every 12 (twelve) hours as needed for cough.  . fluticasone (FLONASE) 50 MCG/ACT nasal spray 2 sprays daily.  . furosemide (LASIX) 40 MG tablet TAKE 1 TABLET BY MOUTH EVERY DAY  . KLOR-CON M20 20 MEQ tablet TAKE 2 TABLETS EVERY DAY FOR POTASSIUM SUPPLEMENT  . levocetirizine (XYZAL) 5 MG tablet Take 1 tablet (5 mg total) by mouth every evening.  Marland Kitchen losartan (COZAAR) 25 MG tablet TAKE 1 TABLET (25 MG TOTAL) BY MOUTH DAILY. TAKE WITH 50 MG TABLET FOR A TOTAL OF 75 MG DAILY.  Marland Kitchen losartan (COZAAR) 50 MG tablet TAKE ONE TABLET BY MOUTH ONCE DAILY ALONG WITH 25MG  FOR A TOTAL OF 75MG  DAILY  . montelukast (SINGULAIR) 10 MG tablet Take 10 mg by mouth at bedtime.    .  Multiple Vitamins-Minerals (MACULAR VITAMIN BENEFIT PO) Take 1 capsule by mouth 2 (two) times daily.    Marland Kitchen NIFEdipine (PROCARDIA XL/ADALAT-CC) 60 MG 24 hr tablet Take 1 tablet (60 mg total) by mouth daily.  . NON FORMULARY Allergy shots once every other week  . Omega-3 Fatty  Acids (FISH OIL PO) Take by mouth daily.  . pantoprazole (PROTONIX) 40 MG tablet TAKE 1 TABLET (40 MG TOTAL) BY MOUTH 2 (TWO) TIMES DAILY. FOR STOMACH  . pravastatin (PRAVACHOL) 40 MG tablet TAKE 1 TABLET BY MOUTH EVERY DAY  . psyllium (METAMUCIL) 58.6 % packet Take 1 packet by mouth daily as needed (constipation).  . ranitidine (ZANTAC) 300 MG tablet Take 300 mg by mouth at bedtime.  Marland Kitchen SPIRIVA HANDIHALER 18 MCG inhalation capsule 18 mcg 2 (two) times daily.  . vitamin B-12 (CYANOCOBALAMIN) 1000 MCG tablet Take 1,000 mcg by mouth daily.  Alveda Reasons 20 MG TABS tablet TAKE 1 TABLET BY MOUTH EVERY DAY   No facility-administered encounter medications on file as of 04/30/2018.     Activities of Daily Living In your present state of health, do you have any difficulty performing the following activities: 04/30/2018  Hearing? N  Vision? N  Difficulty concentrating or making decisions? N  Walking or climbing stairs? N  Dressing or bathing? N  Doing errands, shopping? N  Preparing Food and eating ? N  Using the Toilet? N  In the past six months, have you accidently leaked urine? Y  Do you have problems with loss of bowel control? N  Managing your Medications? N  Managing your Finances? N  Housekeeping or managing your Housekeeping? N  Some recent data might be hidden    Patient Care Team: [name], DO as PCP - General (Internal Medicine) Monna Fam, MD as Consulting Physician (Ophthalmology)    Assessment:   This is a routine wellness examination for Morrisa.  Exercise Activities and Dietary recommendations Current Exercise Habits: The patient does not participate in regular exercise at present, Exercise  limited by: None identified  Goals      Patient Stated   . Exercise 3x per week (30 min per time) (pt-stated)     Starting today I will get up early enough to go to the gym 2-3 times a week.       Fall Risk Fall Risk  04/30/2018 08/29/2017 01/18/2017 01/11/2017 09/09/2016  Falls in the past year? No No No No No  Number falls in past yr: - - - - -  Injury with Fall? - - - - -   Is the patient's home free of loose throw rugs in walkways, pet beds, electrical cords, etc?   yes      Grab bars in the bathroom? no      Handrails on the stairs?   yes      Adequate lighting?   yes  Depression Screen PHQ 2/9 Scores 04/30/2018 08/29/2017 01/18/2017 09/09/2016  PHQ - 2 Score 0 0 0 0     Cognitive Function MMSE - Mini Mental State Exam 04/30/2018 01/18/2017 09/09/2016 05/29/2015  Orientation to time 4 5 4 5   Orientation to Place 5 5 5 5   Registration 3 3 3 3   Attention/ Calculation 4 5 3 5   Recall 2 2 2 3   Language- name 2 objects 2 2 2 2   Language- repeat 1 1 1 1   Language- follow 3 step command 3 3 2 3   Language- read & follow direction 1 1 1 1   Write a sentence 1 1 1 1   Copy design 1 1 1 1   Total score 27 29 25 30         Immunization History  Administered Date(s) Administered  . Influenza Split 06/11/2011, 05/27/2013  . Influenza Whole 07/05/2010, 04/23/2012  . Influenza, High Dose  Seasonal PF 04/30/2018  . Influenza,inj,Quad PF,6+ Mos 05/29/2015, 05/06/2016, 05/17/2017  . Influenza-Unspecified 05/22/2014, 10/26/2015  . Pneumococcal Conjugate-13 10/14/2014  . Pneumococcal Polysaccharide-23 07/29/2010  . Tdap 08/22/2016    Qualifies for Shingles Vaccine? Due, ordered to pharmacy  Screening Tests Health Maintenance  Topic Date Due  . INFLUENZA VACCINE  03/22/2018  . Hepatitis C Screening  08/29/2018 (Originally 29-May-1946)  . COLONOSCOPY  06/12/2018  . MAMMOGRAM  12/17/2019  . TETANUS/TDAP  08/22/2026  . DEXA SCAN  Completed  . PNA vac Low Risk Adult  Completed    Cancer  Screenings: Lung: Low Dose CT Chest recommended if Age 87-80 years, 30 pack-year currently smoking OR have quit w/in 15years. Patient does not qualify. Breast:  Up to date on Mammogram? Yes   Up to date of Bone Density/Dexa? Yes Colorectal: up to date  Additional Screenings:  Hepatitis C Screening:declined High Dose flu vaccine due: given    Plan:    I have personally reviewed and addressed the Medicare Annual Wellness questionnaire and have noted the following in the patient's chart:  A. Medical and social history B. Use of alcohol, tobacco or illicit drugs  C. Current medications and supplements D. Functional ability and status E.  Nutritional status F.  Physical activity G. Advance directives H. List of other physicians I.  Hospitalizations, surgeries, and ER visits in previous 12 months J.  [place] to include hearing, vision, cognitive, depression L. Referrals and appointments - none  In addition, I have reviewed and discussed with patient certain preventive protocols, quality metrics, and best practice recommendations. A written personalized care plan for preventive services as well as general preventive health recommendations were provided to patient.  See attached scanned questionnaire for additional information.   Signed,   Tyson Dense, RN Nurse Health Advisor  Patient Concerns: None

## 2018-04-30 NOTE — Patient Instructions (Signed)
Wendy Figueroa , Thank you for taking time to come for your Medicare Wellness Visit. I appreciate your ongoing commitment to your health goals. Please review the following plan we discussed and let me know if I can assist you in the future.   Screening recommendations/referrals: Colonoscopy up to date,completed 06/12/2013 Mammogram up to date, completed  12/19/2019 Bone Density up to date, last completed 10/07/2016 Recommended yearly ophthalmology/optometry visit for glaucoma screening and checkup Recommended yearly dental visit for hygiene and checkup  Vaccinations: Influenza vaccine high dose given today Pneumococcal vaccine up to date, completed Tdap vaccine up to date, due 08/22/2026 Shingles vaccine due, prescription sent to pharmacy    Advanced directives: Advance directive discussed with you today. I have provided a copy for you to complete at home and have notarized. Once this is complete please bring a copy in to our office so we can scan it into your chart.  Conditions/risks identified:none  Next appointment: Dr. Eulas Post 05/02/2018 @ 10:30 am            Tyson Dense, RN 05/03/2019 @ 8:30am   Preventive Care 74 Years and Older, Female Preventive care refers to lifestyle choices and visits with your health care provider that can promote health and wellness. What does preventive care include?  A yearly physical exam. This is also called an annual well check.  Dental exams once or twice a year.  Routine eye exams. Ask your health care provider how often you should have your eyes checked.  Personal lifestyle choices, including:  Daily care of your teeth and gums.  Regular physical activity.  Eating a healthy diet.  Avoiding tobacco and drug use.  Limiting alcohol use.  Practicing safe sex.  Taking low-dose aspirin every day.  Taking vitamin and mineral supplements as recommended by your health care provider. What happens during an annual well check? The services and  screenings done by your health care provider during your annual well check will depend on your age, overall health, lifestyle risk factors, and family history of disease. Counseling  Your health care provider may ask you questions about your:  Alcohol use.  Tobacco use.  Drug use.  Emotional well-being.  Home and relationship well-being.  Sexual activity.  Eating habits.  History of falls.  Memory and ability to understand (cognition).  Work and work Statistician.  Reproductive health. Screening  You may have the following tests or measurements:  Height, weight, and BMI.  Blood pressure.  Lipid and cholesterol levels. These may be checked every 5 years, or more frequently if you are over 19 years old.  Skin check.  Lung cancer screening. You may have this screening every year starting at age 74 if you have a 30-pack-year history of smoking and currently smoke or have quit within the past 15 years.  Fecal occult blood test (FOBT) of the stool. You may have this test every year starting at age 74.  Flexible sigmoidoscopy or colonoscopy. You may have a sigmoidoscopy every 5 years or a colonoscopy every 10 years starting at age 74.  Hepatitis C blood test.  Hepatitis B blood test.  Sexually transmitted disease (STD) testing.  Diabetes screening. This is done by checking your blood sugar (glucose) after you have not eaten for a while (fasting). You may have this done every 1-3 years.  Bone density scan. This is done to screen for osteoporosis. You may have this done starting at age 74.  Mammogram. This may be done every 1-2 years. Talk to  your health care provider about how often you should have regular mammograms. Talk with your health care provider about your test results, treatment options, and if necessary, the need for more tests. Vaccines  Your health care provider may recommend certain vaccines, such as:  Influenza vaccine. This is recommended every  year.  Tetanus, diphtheria, and acellular pertussis (Tdap, Td) vaccine. You may need a Td booster every 10 years.  Zoster vaccine. You may need this after age 40.  Pneumococcal 13-valent conjugate (PCV13) vaccine. One dose is recommended after age 20.  Pneumococcal polysaccharide (PPSV23) vaccine. One dose is recommended after age 22. Talk to your health care provider about which screenings and vaccines you need and how often you need them. This information is not intended to replace advice given to you by your health care provider. Make sure you discuss any questions you have with your health care provider. Document Released: 09/04/2015 Document Revised: 04/27/2016 Document Reviewed: 06/09/2015 Elsevier Interactive Patient Education  2017 [employer] Prevention in the Home Falls can cause injuries. They can happen to people of all ages. There are many things you can do to make your home safe and to help prevent falls. What can I do on the outside of my home?  Regularly fix the edges of walkways and driveways and fix any cracks.  Remove anything that might make you trip as you walk through a door, such as a raised step or threshold.  Trim any bushes or trees on the path to your home.  Use bright outdoor lighting.  Clear any walking paths of anything that might make someone trip, such as rocks or tools.  Regularly check to see if handrails are loose or broken. Make sure that both sides of any steps have handrails.  Any raised decks and porches should have guardrails on the edges.  Have any leaves, snow, or ice cleared regularly.  Use sand or salt on walking paths during winter.  Clean up any spills in your garage right away. This includes oil or grease spills. What can I do in the bathroom?  Use night lights.  Install grab bars by the toilet and in the tub and shower. Do not use towel bars as grab bars.  Use non-skid mats or decals in the tub or shower.  If you  need to sit down in the shower, use a plastic, non-slip stool.  Keep the floor dry. Clean up any water that spills on the floor as soon as it happens.  Remove soap buildup in the tub or shower regularly.  Attach bath mats securely with double-sided non-slip rug tape.  Do not have throw rugs and other things on the floor that can make you trip. What can I do in the bedroom?  Use night lights.  Make sure that you have a light by your bed that is easy to reach.  Do not use any sheets or blankets that are too big for your bed. They should not hang down onto the floor.  Have a firm chair that has side arms. You can use this for support while you get dressed.  Do not have throw rugs and other things on the floor that can make you trip. What can I do in the kitchen?  Clean up any spills right away.  Avoid walking on wet floors.  Keep items that you use a lot in easy-to-reach places.  If you need to reach something above you, use a strong step stool that has  a grab bar.  Keep electrical cords out of the way.  Do not use floor polish or wax that makes floors slippery. If you must use wax, use non-skid floor wax.  Do not have throw rugs and other things on the floor that can make you trip. What can I do with my stairs?  Do not leave any items on the stairs.  Make sure that there are handrails on both sides of the stairs and use them. Fix handrails that are broken or loose. Make sure that handrails are as long as the stairways.  Check any carpeting to make sure that it is firmly attached to the stairs. Fix any carpet that is loose or worn.  Avoid having throw rugs at the top or bottom of the stairs. If you do have throw rugs, attach them to the floor with carpet tape.  Make sure that you have a light switch at the top of the stairs and the bottom of the stairs. If you do not have them, ask someone to add them for you. What else can I do to help prevent falls?  Wear shoes  that:  Do not have high heels.  Have rubber bottoms.  Are comfortable and fit you well.  Are closed at the toe. Do not wear sandals.  If you use a stepladder:  Make sure that it is fully opened. Do not climb a closed stepladder.  Make sure that both sides of the stepladder are locked into place.  Ask someone to hold it for you, if possible.  Clearly mark and make sure that you can see:  Any grab bars or handrails.  First and last steps.  Where the edge of each step is.  Use tools that help you move around (mobility aids) if they are needed. These include:  Canes.  Walkers.  Scooters.  Crutches.  Turn on the lights when you go into a dark area. Replace any light bulbs as soon as they burn out.  Set up your furniture so you have a clear path. Avoid moving your furniture around.  If any of your floors are uneven, fix them.  If there are any pets around you, be aware of where they are.  Review your medicines with your doctor. Some medicines can make you feel dizzy. This can increase your chance of falling. Ask your doctor what other things that you can do to help prevent falls. This information is not intended to replace advice given to you by your health care provider. Make sure you discuss any questions you have with your health care provider. Document Released: 06/04/2009 Document Revised: 01/14/2016 Document Reviewed: 09/12/2014 Elsevier Interactive Patient Education  2017 [employer].

## 2018-05-02 ENCOUNTER — Encounter: Payer: Self-pay | Admitting: Internal Medicine

## 2018-05-02 ENCOUNTER — Ambulatory Visit (INDEPENDENT_AMBULATORY_CARE_PROVIDER_SITE_OTHER): Payer: PPO | Admitting: Internal Medicine

## 2018-05-02 VITALS — BP 126/72 | HR 74 | Temp 98.3°F | Ht 63.0 in | Wt 217.8 lb

## 2018-05-02 DIAGNOSIS — R600 Localized edema: Secondary | ICD-10-CM | POA: Diagnosis not present

## 2018-05-02 DIAGNOSIS — J4520 Mild intermittent asthma, uncomplicated: Secondary | ICD-10-CM | POA: Diagnosis not present

## 2018-05-02 DIAGNOSIS — R05 Cough: Secondary | ICD-10-CM | POA: Diagnosis not present

## 2018-05-02 DIAGNOSIS — F4322 Adjustment disorder with anxiety: Secondary | ICD-10-CM

## 2018-05-02 DIAGNOSIS — E785 Hyperlipidemia, unspecified: Secondary | ICD-10-CM

## 2018-05-02 DIAGNOSIS — I10 Essential (primary) hypertension: Secondary | ICD-10-CM | POA: Diagnosis not present

## 2018-05-02 DIAGNOSIS — R053 Chronic cough: Secondary | ICD-10-CM

## 2018-05-02 DIAGNOSIS — I2609 Other pulmonary embolism with acute cor pulmonale: Secondary | ICD-10-CM | POA: Diagnosis not present

## 2018-05-02 MED ORDER — DULOXETINE HCL 30 MG PO CPEP: 30.0000 mg | ORAL_CAPSULE | Freq: Every day | ORAL | 6 refills | Status: DC

## 2018-05-02 NOTE — Patient Instructions (Addendum)
START CYMBALTA 9DULOXETINE) 30MG  DAILY FOR MOOD  Continue other medications as ordered  Follow up with specialists as scheduled  Follow up in 1 month for adjustment d/o

## 2018-05-02 NOTE — Progress Notes (Signed)
Patient ID: Wendy Figueroa, female   DOB: [birth year], 72 y.o.   MRN: [id number]   Location:  [facility] OFFICE  Provider: DR Arletha Grippe  Code Status:  Goals of Care:  Advanced Directives 05/02/2018  Does Patient Have a Medical Advance Directive? No  Type of Advance Directive -  Does patient want to make changes to medical advance directive? -  Would patient like information on creating a medical advance directive? -     Chief Complaint  Patient presents with  . Medical Management of Chronic Issues    Pt is being seen for a 4 month routine visit.     HPI: Patient is a 72 y.o. female seen today for medical management of chronic diseases.  No new concerns.   Hx dysphagia/chronic cough - last upper esophageal sphincter balloon dilation and suspension microdirect laryngoscopy on 04/18/16 by Dr Rowe Clack at [facility]. Swallowing is unchanged and has some difficulty when she rushes to eat. She gets strangled easily. She saw ENT at [facility] Dr Blenda Nicely and recommended cough suppression tx but no one does that rehab in [place]. She does not want to travel to [facility] for rehab. Takes protonix BID for GERD.  She is able to swallow potassium tabs without difficulty.   HTN - BP stable on on losartan 75 mg daily, procardia XL and lasix  Asthma - no recent flares. stable on HFA prn. She takes spiriva daily, singulair daily and symbicort every other day. No asthma exacerbations/attacks. She also takes xyzal and flonase for allergic rhinitis.  She gets allergy shots weekly. Followed by pulmonary  Hyperlipidemia - stable; takes pravachol. LDL 89. No myalgias  Arthritis - stable. Pain controlled; alk phos 160  PE - completed 1 yr of xeralto tx in Aug 2019  Elevated alk phosphatase - chronic. Stable; Alk phos 160   Past Medical History:  Diagnosis Date  . Abnormal Pap smear    Over 62yr ago  . ALKALINE PHOSPHATASE, ELEVATED 07/01/2009   Qualifier: Diagnosis of  By: NLenna GilfordMD, SDeborra Medina  . Allergic rhinitis   . Anemia   . Arthritis   . Asthma   . Cough    occasional  . Cystocele 11/05/08  . Diverticulosis of colon   . DJD (degenerative joint disease)    no per pt  . Elevated alkaline phosphatase level   . GERD (gastroesophageal reflux disease)   . H/O urinary incontinence 11/06/2008  . H/O varicella   . Hard measles   . Hx of colonic polyps   . Hypercholesterolemia   . Hypertension   . Leg swelling    occasional  . 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 Overweight(278.02)   . Rectocele 11/06/2008   Large  . Sebaceous cyst    on back  . Sinusitis   . Venous insufficiency   . Wheezing    occasional  . Yeast infection     Past Surgical History:  Procedure Laterality Date  . ABDOMINAL HYSTERECTOMY  1988  . ANTERIOR AND POSTERIOR REPAIR  11/05/08  . APOGEE / PERIGEE REPAIR  10/2008   Dr. AOctavio Manns . bladder tack  2011  . BREAST SURGERY  1978   cyst removed from both removed  . COLONOSCOPY    . ESOPHAGOGASTRODUODENOSCOPY       reports that she has never smoked. She has never used smokeless tobacco. She reports that she drinks about 2.0 standard drinks of alcohol per week. She reports that she  does not use drugs. Social History   Socioeconomic History  . Marital status: Married    Spouse name: [name]  . Number of children: 2  . Years of education: Not on file  . Highest education level: Not on file  Occupational History  . Occupation: LPN   Social Needs  . Financial resource strain: Not hard at all  . Food insecurity:    Worry: Never true    Inability: Never true  . Transportation needs:    Medical: No    Non-medical: No  Tobacco Use  . Smoking status: Never Smoker  . Smokeless tobacco: Never Used  Substance and Sexual Activity  . Alcohol use: Yes    Alcohol/week: 2.0 standard drinks    Types: 2 Cans of beer per week    Comment: 2 beers on the weekend  . Drug use: No  . Sexual activity: Not Currently    Partners:  Male    Birth control/protection: None  Lifestyle  . Physical activity:    Days per week: 0 days    Minutes per session: 0 min  . Stress: Only a little  Relationships  . Social connections:    Talks on phone: More than three times a week    Gets together: More than three times a week    Attends religious service: More than 4 times per year    Active member of club or organization: Yes    Attends meetings of clubs or organizations: More than 4 times per year    Relationship status: Married  . Intimate partner violence:    Fear of current or ex partner: No    Emotionally abused: No    Physically abused: No    Forced sexual activity: No  Other Topics Concern  . Not on file  Social History Narrative   Married   Never smoked   Alcohol few beers on week-end   No Advance Directive     Family History  Problem Relation Age of Onset  . Aneurysm Mother   . Lung cancer Father   . Hypertension Daughter   . Hypertension Daughter   . Colon cancer Neg Hx   . Esophageal cancer Neg Hx   . Stomach cancer Neg Hx   . Rectal cancer Neg Hx   . Colon polyps Neg Hx     Allergies  Allergen Reactions  . Demerol Hives    All over the body   . Lisinopril-Hydrochlorothiazide Hives    All over the body  . Penicillins Hives    Has patient had a PCN reaction causing immediate rash, facial/tongue/throat swelling, SOB or lightheadedness with hypotension: No Has patient had a PCN reaction causing severe rash involving mucus membranes or skin necrosis: No Has patient had a PCN reaction that required hospitalization: No Has patient had a PCN reaction occurring within the last 10 years: No If all of the above answers are "NO", then may proceed with Cephalosporin use.   All over the body    Outpatient Encounter Medications as of 05/02/2018  Medication Sig  . albuterol (PROVENTIL HFA;VENTOLIN HFA) 108 (90 Base) MCG/ACT inhaler Inhale 2 puffs into the lungs every 6 (six) hours as needed for  wheezing or shortness of breath.  . budesonide-formoterol (SYMBICORT) 160-4.5 MCG/ACT inhaler Inhale 2 puffs into the lungs 2 (two) times daily.  . Calcium Carb-Cholecalciferol (CALCIUM 600 + D PO) Take by mouth 2 (two) times daily. Take one tablet daily   . cetirizine (ZYRTEC) 10 MG  tablet Take 10 mg by mouth daily.  . chlorpheniramine-HYDROcodone (TUSSIONEX PENNKINETIC ER) 10-8 MG/5ML SUER Take 5 mLs by mouth every 12 (twelve) hours as needed for cough.  . fluticasone (FLONASE) 50 MCG/ACT nasal spray 2 sprays daily.  . furosemide (LASIX) 40 MG tablet TAKE 1 TABLET BY MOUTH EVERY DAY  . KLOR-CON M20 20 MEQ tablet TAKE 2 TABLETS EVERY DAY FOR POTASSIUM SUPPLEMENT  . levocetirizine (XYZAL) 5 MG tablet Take 1 tablet (5 mg total) by mouth every evening.  Marland Kitchen losartan (COZAAR) 25 MG tablet TAKE 1 TABLET (25 MG TOTAL) BY MOUTH DAILY. TAKE WITH 50 MG TABLET FOR A TOTAL OF 75 MG DAILY.  Marland Kitchen losartan (COZAAR) 50 MG tablet TAKE ONE TABLET BY MOUTH ONCE DAILY ALONG WITH '25MG'$  FOR A TOTAL OF '75MG'$  DAILY  . montelukast (SINGULAIR) 10 MG tablet Take 10 mg by mouth at bedtime.    . Multiple Vitamins-Minerals (MACULAR VITAMIN BENEFIT PO) Take 1 capsule by mouth 2 (two) times daily.    Marland Kitchen NIFEdipine (PROCARDIA XL/ADALAT-CC) 60 MG 24 hr tablet Take 1 tablet (60 mg total) by mouth daily.  . NON FORMULARY Allergy shots once every other week  . Omega-3 Fatty Acids (FISH OIL PO) Take by mouth daily.  . pantoprazole (PROTONIX) 40 MG tablet TAKE 1 TABLET (40 MG TOTAL) BY MOUTH 2 (TWO) TIMES DAILY. FOR STOMACH  . pravastatin (PRAVACHOL) 40 MG tablet TAKE 1 TABLET BY MOUTH EVERY DAY  . psyllium (METAMUCIL) 58.6 % packet Take 1 packet by mouth daily as needed (constipation).  . ranitidine (ZANTAC) 300 MG tablet Take 300 mg by mouth at bedtime.  Marland Kitchen SPIRIVA HANDIHALER 18 MCG inhalation capsule 18 mcg 2 (two) times daily.  . vitamin B-12 (CYANOCOBALAMIN) 1000 MCG tablet Take 1,000 mcg by mouth daily.  . [DISCONTINUED] XARELTO 20  MG TABS tablet TAKE 1 TABLET BY MOUTH EVERY DAY   No facility-administered encounter medications on file as of 05/02/2018.     Review of Systems:  Review of Systems  Respiratory: Positive for cough.   Cardiovascular: Positive for leg swelling.  Psychiatric/Behavioral: The patient is nervous/anxious.   All other systems reviewed and are negative.   Health Maintenance  Topic Date Due  . Hepatitis C Screening  08/29/2018 (Originally 07/07/1946)  . COLONOSCOPY  06/12/2018  . MAMMOGRAM  12/17/2019  . TETANUS/TDAP  08/22/2026  . INFLUENZA VACCINE  Completed  . DEXA SCAN  Completed  . PNA vac Low Risk Adult  Completed    Physical Exam: Vitals:   05/02/18 1005  BP: 126/72  Pulse: 74  Temp: 98.3 F (36.8 C)  TempSrc: Oral  SpO2: 98%  Weight: 217 lb 12.8 oz (98.8 kg)  Height: '5\' 3"'$  (1.6 m)   Body mass index is 38.58 kg/m. Physical Exam  Constitutional: She is oriented to person, place, and time. She appears well-developed and well-nourished.  HENT:  Mouth/Throat: Oropharynx is clear and moist. No oropharyngeal exudate.  MMM; no oral thrush  Eyes: Pupils are equal, round, and reactive to light. No scleral icterus.  Neck: Neck supple. Carotid bruit is not present. No tracheal deviation present. No thyromegaly present.  Cardiovascular: Normal rate, regular rhythm and intact distal pulses. Exam reveals no gallop and no friction rub.  Murmur (1/6 SEM) heard. +1 pitting LE edema b/l; no calf TTP  Pulmonary/Chest: Effort normal. No stridor. No respiratory distress. She has wheezes (rare). She has no rales.  Abdominal: Soft. Normal appearance and bowel sounds are normal. She exhibits no distension and  no mass. There is no hepatomegaly. There is no tenderness. There is no rigidity, no rebound and no guarding. No hernia.  Musculoskeletal: She exhibits edema.  Lymphadenopathy:    She has no cervical adenopathy.  Neurological: She is alert and oriented to person, place, and time. She has  normal reflexes.  Skin: Skin is warm and dry. No rash noted.  Psychiatric: She has a normal mood and affect. Her behavior is normal. Judgment and thought content normal.    Labs reviewed: Basic Metabolic Panel: Recent Labs    10/11/17 1221 12/25/17 0900 04/30/18 0839  NA 138 140 142  K 3.8 4.0 4.0  CL 103 108 107  CO2 '28 26 27  '$ GLUCOSE 124* 101* 85  BUN '13 15 13  '$ CREATININE 0.95 0.85 0.94*  CALCIUM 10.1 9.4 9.6  TSH 1.30  --   --    Liver Function Tests: Recent Labs    08/25/17 0856 10/11/17 1221 12/25/17 0900 04/30/18 0839  AST 21 20  --  23  ALT '14 15 15 13  '$ ALKPHOS  --  156*  --   --   BILITOT 0.5 0.5  --  0.4  PROT 7.3 8.3  --  7.0  ALBUMIN  --  4.2  --   --    No results for input(s): LIPASE, AMYLASE in the last 8760 hours. No results for input(s): AMMONIA in the last 8760 hours. CBC: Recent Labs    08/25/17 0856 10/11/17 1221  WBC 5.9 6.1  NEUTROABS 2,708 3.2  HGB 11.8 12.2  HCT 36.8 37.6  MCV 79.0* 81.6  PLT 278 289.0   Lipid Panel: Recent Labs    08/25/17 0856 12/25/17 0900 04/30/18 0839  CHOL 169 168 160  HDL 63 56 52  LDLCALC 89 95 89  TRIG 83 76 99  CHOLHDL 2.7 3.0 3.1   Lab Results  Component Value Date   HGBA1C 5.6 05/26/2014    Procedures since last visit: No results found.  Assessment/Plan     ICD-10-CM   1. Adjustment disorder with anxious mood F43.22 DULoxetine (CYMBALTA) 30 MG capsule  2. Chronic cough R05   3. Mild intermittent asthma, unspecified whether complicated B15.17   4. Essential hypertension I10   5. Hyperlipidemia LDL goal <130 E78.5   6. Lower extremity edema R60.0   7. Other acute pulmonary embolism with acute cor pulmonale (HCC) I26.09    resolved    START CYMBALTA 9DULOXETINE) '30MG'$  DAILY FOR MOOD  Continue other medications as ordered  Labs reviewed with pt  Follow up with specialists as scheduled  Follow up in 1 month for adjustment d/o   Cliffard Hair S. Perlie Gold  [facility] [address] [place], [place] [address] [phone] Cell (Monday-Friday 8 AM - 5 PM) [phone] After 5 PM and follow prompts

## 2018-05-09 ENCOUNTER — Ambulatory Visit: Payer: PPO | Admitting: Podiatry

## 2018-05-09 ENCOUNTER — Encounter: Payer: Self-pay | Admitting: Podiatry

## 2018-05-09 ENCOUNTER — Other Ambulatory Visit: Payer: Self-pay | Admitting: Internal Medicine

## 2018-05-09 DIAGNOSIS — M79674 Pain in right toe(s): Secondary | ICD-10-CM | POA: Diagnosis not present

## 2018-05-09 DIAGNOSIS — B351 Tinea unguium: Secondary | ICD-10-CM

## 2018-05-09 DIAGNOSIS — M79675 Pain in left toe(s): Secondary | ICD-10-CM

## 2018-05-09 DIAGNOSIS — M79676 Pain in unspecified toe(s): Secondary | ICD-10-CM | POA: Diagnosis not present

## 2018-05-09 NOTE — Progress Notes (Signed)
Complaint:  Visit Type: Patient returns to my office for continued preventative foot care services. Complaint: Patient states" my nails have grown long and thick and become painful to walk and wear shoes" . The patient presents for preventative foot care services. No changes to ROS  Podiatric Exam: Vascular: dorsalis pedis and posterior tibial pulses are palpable bilateral. Capillary return is immediate. Temperature gradient is WNL. Skin turgor WNL  Sensorium: Normal Semmes Weinstein monofilament test. Normal tactile sensation bilaterally. Nail Exam: Pt has thick disfigured discolored nails with subungual debris noted bilateral entire nail hallux through fifth toenails Ulcer Exam: There is no evidence of ulcer or pre-ulcerative changes or infection. Orthopedic Exam: Muscle tone and strength are WNL. No limitations in general ROM. No crepitus or effusions noted. Foot type and digits show no abnormalities. HAV with hammer toe  B/L. Skin: No Porokeratosis. No infection or ulcers  Diagnosis:  Onychomycosis, , Pain in right toe, pain in left toes  Treatment & Plan Procedures and Treatment: Consent by patient was obtained for treatment procedures.   Debridement of mycotic and hypertrophic toenails, 1 through 5 bilateral and clearing of subungual debris. No ulceration, no infection noted.  Return Visit-Office Procedure: Patient instructed to return to the office for a follow up visit 3 months for continued evaluation and treatment.    Tranise Forrest DPM 

## 2018-05-11 DIAGNOSIS — J3089 Other allergic rhinitis: Secondary | ICD-10-CM | POA: Diagnosis not present

## 2018-05-11 DIAGNOSIS — J301 Allergic rhinitis due to pollen: Secondary | ICD-10-CM | POA: Diagnosis not present

## 2018-05-11 DIAGNOSIS — J3081 Allergic rhinitis due to animal (cat) (dog) hair and dander: Secondary | ICD-10-CM | POA: Diagnosis not present

## 2018-05-18 DIAGNOSIS — J3089 Other allergic rhinitis: Secondary | ICD-10-CM | POA: Diagnosis not present

## 2018-05-18 DIAGNOSIS — J3081 Allergic rhinitis due to animal (cat) (dog) hair and dander: Secondary | ICD-10-CM | POA: Diagnosis not present

## 2018-05-18 DIAGNOSIS — J4540 Moderate persistent asthma, uncomplicated: Secondary | ICD-10-CM | POA: Diagnosis not present

## 2018-05-18 DIAGNOSIS — J301 Allergic rhinitis due to pollen: Secondary | ICD-10-CM | POA: Diagnosis not present

## 2018-05-25 DIAGNOSIS — J3081 Allergic rhinitis due to animal (cat) (dog) hair and dander: Secondary | ICD-10-CM | POA: Diagnosis not present

## 2018-05-25 DIAGNOSIS — J3089 Other allergic rhinitis: Secondary | ICD-10-CM | POA: Diagnosis not present

## 2018-05-25 DIAGNOSIS — J301 Allergic rhinitis due to pollen: Secondary | ICD-10-CM | POA: Diagnosis not present

## 2018-05-27 ENCOUNTER — Other Ambulatory Visit: Payer: Self-pay | Admitting: Internal Medicine

## 2018-05-27 DIAGNOSIS — F4322 Adjustment disorder with anxiety: Secondary | ICD-10-CM

## 2018-05-28 ENCOUNTER — Other Ambulatory Visit: Payer: Self-pay | Admitting: *Deleted

## 2018-05-28 DIAGNOSIS — R059 Cough, unspecified: Secondary | ICD-10-CM

## 2018-05-28 DIAGNOSIS — R053 Chronic cough: Secondary | ICD-10-CM

## 2018-05-28 DIAGNOSIS — R05 Cough: Secondary | ICD-10-CM

## 2018-05-28 MED ORDER — HYDROCOD POLST-CPM POLST ER 10-8 MG/5ML PO SUER: 5.0000 mL | Freq: Two times a day (BID) | ORAL | 0 refills | Status: DC | PRN

## 2018-05-28 NOTE — Telephone Encounter (Signed)
Patient called and requested refill [place] Database Verified LR: 02/26/2018 Pended Rx and sent to Dr. Eulas Post for approval.

## 2018-05-29 ENCOUNTER — Ambulatory Visit: Payer: PPO | Admitting: Pulmonary Disease

## 2018-06-05 ENCOUNTER — Ambulatory Visit: Payer: PPO | Admitting: Pulmonary Disease

## 2018-06-05 VITALS — BP 132/80 | HR 89 | Temp 97.9°F | Ht 63.0 in | Wt 216.6 lb

## 2018-06-05 DIAGNOSIS — Z86711 Personal history of pulmonary embolism: Secondary | ICD-10-CM

## 2018-06-05 DIAGNOSIS — R053 Chronic cough: Secondary | ICD-10-CM

## 2018-06-05 DIAGNOSIS — J3089 Other allergic rhinitis: Secondary | ICD-10-CM

## 2018-06-05 DIAGNOSIS — R059 Cough, unspecified: Secondary | ICD-10-CM

## 2018-06-05 DIAGNOSIS — K219 Gastro-esophageal reflux disease without esophagitis: Secondary | ICD-10-CM

## 2018-06-05 DIAGNOSIS — R05 Cough: Secondary | ICD-10-CM | POA: Diagnosis not present

## 2018-06-05 DIAGNOSIS — J4520 Mild intermittent asthma, uncomplicated: Secondary | ICD-10-CM

## 2018-06-05 DIAGNOSIS — R490 Dysphonia: Secondary | ICD-10-CM

## 2018-06-05 MED ORDER — HYDROCOD POLST-CPM POLST ER 10-8 MG/5ML PO SUER: 5.0000 mL | Freq: Two times a day (BID) | ORAL | 0 refills | Status: DC | PRN

## 2018-06-05 NOTE — Patient Instructions (Addendum)
Today we updated your med list in our EPIC system...    Continue your current medications the same...  Continue your Symbicort160- 2sprays twice daily...    Continue the Spiriva once daily...       Continue the singulair, Zyrtek, & Flonase per DrSharma...  We refilled your Tussionex for as needed use...  Remember to follow a vigorous ANTIREFLUX regimen >>    Take the Protonix 40mg  aabout 30 min before breakfast & dinner...    Do not eat or drink anything after dinner in the evening!    Elevate the head of your bed at least 6 inches  Chauntelle,  It has been my great honor to have been one of your doctors over these many years!    Wishing you good health & much happiness in the years to come.Marland KitchenMarland Kitchen

## 2018-06-08 ENCOUNTER — Encounter: Payer: Self-pay | Admitting: Pulmonary Disease

## 2018-06-08 ENCOUNTER — Ambulatory Visit: Payer: PPO | Admitting: Nurse Practitioner

## 2018-06-08 ENCOUNTER — Encounter: Payer: Self-pay | Admitting: Internal Medicine

## 2018-06-08 ENCOUNTER — Ambulatory Visit (INDEPENDENT_AMBULATORY_CARE_PROVIDER_SITE_OTHER): Payer: PPO | Admitting: Internal Medicine

## 2018-06-08 VITALS — BP 122/70 | HR 75 | Temp 98.3°F | Ht 63.0 in | Wt 217.0 lb

## 2018-06-08 DIAGNOSIS — J301 Allergic rhinitis due to pollen: Secondary | ICD-10-CM | POA: Diagnosis not present

## 2018-06-08 DIAGNOSIS — F4322 Adjustment disorder with anxiety: Secondary | ICD-10-CM | POA: Diagnosis not present

## 2018-06-08 DIAGNOSIS — J3081 Allergic rhinitis due to animal (cat) (dog) hair and dander: Secondary | ICD-10-CM | POA: Diagnosis not present

## 2018-06-08 DIAGNOSIS — R053 Chronic cough: Secondary | ICD-10-CM

## 2018-06-08 DIAGNOSIS — R05 Cough: Secondary | ICD-10-CM

## 2018-06-08 DIAGNOSIS — J3089 Other allergic rhinitis: Secondary | ICD-10-CM | POA: Diagnosis not present

## 2018-06-08 MED ORDER — HYDROCOD POLST-CPM POLST ER 10-8 MG/5ML PO SUER: 5.0000 mL | Freq: Two times a day (BID) | ORAL | 0 refills | Status: DC | PRN

## 2018-06-08 NOTE — Progress Notes (Signed)
Patient ID: Wendy Figueroa, female   DOB: 09-Jun-1946, 72 y.o.   MRN: [id number]   Location:  [facility] OFFICE  Provider: DR Arletha Grippe  Code Status:  Goals of Care:  Advanced Directives 05/02/2018  Does Patient Have a Medical Advance Directive? No  Type of Advance Directive -  Does patient want to make changes to medical advance directive? -  Would patient like information on creating a medical advance directive? -     Chief Complaint  Patient presents with  . Medical Management of Chronic Issues    88mth follow-up    HPI: Patient is a 72 y.o. female seen today for ef/u adjustment d/o with anxious mood. She was started on cymbalta 30mg  daily last month. She reports feeling less anxious but still has a persistent cough "that comes out of no where". Medicine works 25% of the time. She continues to get allergy shots and use inhalers for asthma and seasonal allergy   Past Medical History:  Diagnosis Date  . Abnormal Pap smear    Over 75yrs ago  . ALKALINE PHOSPHATASE, ELEVATED 07/01/2009   Qualifier: Diagnosis of  By: [name] MD, [name]   . Allergic rhinitis   . Anemia   . Arthritis   . Asthma   . Cough    occasional  . Cystocele 11/05/08  . Diverticulosis of colon   . DJD (degenerative joint disease)    no per pt  . Elevated alkaline phosphatase level   . GERD (gastroesophageal reflux disease)   . H/O urinary incontinence 11/06/2008  . H/O varicella   . Hard measles   . Hx of colonic polyps   . Hypercholesterolemia   . Hypertension   . Leg swelling    occasional  . 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 Overweight(278.02)   . Rectocele 11/06/2008   Large  . Sebaceous cyst    on back  . Sinusitis   . Venous insufficiency   . Wheezing    occasional  . Yeast infection     Past Surgical History:  Procedure Laterality Date  . ABDOMINAL HYSTERECTOMY  1988  . ANTERIOR AND POSTERIOR REPAIR  11/05/08  . APOGEE / PERIGEE REPAIR  10/2008   Dr. Octavio Manns    . bladder tack  2011  . BREAST SURGERY  1978   cyst removed from both removed  . COLONOSCOPY    . ESOPHAGOGASTRODUODENOSCOPY       reports that she has never smoked. She has never used smokeless tobacco. She reports that she drinks about 2.0 standard drinks of alcohol per week. She reports that she does not use drugs. Social History   Socioeconomic History  . Marital status: Married    Spouse name: [name]  . Number of children: 2  . Years of education: Not on file  . Highest education level: Not on file  Occupational History  . Occupation: LPN   Social Needs  . Financial resource strain: Not hard at all  . Food insecurity:    Worry: Never true    Inability: Never true  . Transportation needs:    Medical: No    Non-medical: No  Tobacco Use  . Smoking status: Never Smoker  . Smokeless tobacco: Never Used  Substance and Sexual Activity  . Alcohol use: Yes    Alcohol/week: 2.0 standard drinks    Types: 2 Cans of beer per week    Comment: 2 beers on the weekend  . Drug use: No  .  Sexual activity: Not Currently    Partners: Male    Birth control/protection: None  Lifestyle  . Physical activity:    Days per week: 0 days    Minutes per session: 0 min  . Stress: Only a little  Relationships  . Social connections:    Talks on phone: More than three times a week    Gets together: More than three times a week    Attends religious service: More than 4 times per year    Active member of club or organization: Yes    Attends meetings of clubs or organizations: More than 4 times per year    Relationship status: Married  . Intimate partner violence:    Fear of current or ex partner: No    Emotionally abused: No    Physically abused: No    Forced sexual activity: No  Other Topics Concern  . Not on file  Social History Narrative   Married   Never smoked   Alcohol few beers on week-end   No Advance Directive     Family History  Problem Relation Age of Onset  .  Aneurysm Mother   . Lung cancer Father   . Hypertension Daughter   . Hypertension Daughter   . Colon cancer Neg Hx   . Esophageal cancer Neg Hx   . Stomach cancer Neg Hx   . Rectal cancer Neg Hx   . Colon polyps Neg Hx     Allergies  Allergen Reactions  . Demerol Hives    All over the body   . Lisinopril-Hydrochlorothiazide Hives    All over the body  . Penicillins Hives    Has patient had a PCN reaction causing immediate rash, facial/tongue/throat swelling, SOB or lightheadedness with hypotension: No Has patient had a PCN reaction causing severe rash involving mucus membranes or skin necrosis: No Has patient had a PCN reaction that required hospitalization: No Has patient had a PCN reaction occurring within the last 10 years: No If all of the above answers are "NO", then may proceed with Cephalosporin use.   All over the body    Outpatient Encounter Medications as of 06/08/2018  Medication Sig  . albuterol (PROVENTIL HFA;VENTOLIN HFA) 108 (90 Base) MCG/ACT inhaler Inhale 2 puffs into the lungs every 6 (six) hours as needed for wheezing or shortness of breath.  . budesonide-formoterol (SYMBICORT) 160-4.5 MCG/ACT inhaler Inhale 2 puffs into the lungs 2 (two) times daily.  . Calcium Carb-Cholecalciferol (CALCIUM 600 + D PO) Take by mouth 2 (two) times daily. Take one tablet daily   . cetirizine (ZYRTEC) 10 MG tablet Take 10 mg by mouth daily.  . chlorpheniramine-HYDROcodone (TUSSIONEX PENNKINETIC ER) 10-8 MG/5ML SUER Take 5 mLs by mouth every 12 (twelve) hours as needed for cough.  . DULoxetine (CYMBALTA) 30 MG capsule TAKE 1 CAPSULE BY MOUTH EVERY DAY  . fluticasone (FLONASE) 50 MCG/ACT nasal spray 2 sprays daily.  . furosemide (LASIX) 40 MG tablet TAKE 1 TABLET BY MOUTH EVERY DAY  . KLOR-CON M20 20 MEQ tablet TAKE 2 TABLETS EVERY DAY FOR POTASSIUM SUPPLEMENT  . levocetirizine (XYZAL) 5 MG tablet Take 1 tablet (5 mg total) by mouth every evening.  Marland Kitchen losartan (COZAAR) 25 MG  tablet TAKE 1 TABLET (25 MG TOTAL) BY MOUTH DAILY. TAKE WITH 50 MG TABLET FOR A TOTAL OF 75 MG DAILY.  Marland Kitchen losartan (COZAAR) 50 MG tablet TAKE ONE TABLET BY MOUTH ONCE DAILY ALONG WITH 25MG  FOR A TOTAL OF 75MG  DAILY  .  montelukast (SINGULAIR) 10 MG tablet Take 10 mg by mouth at bedtime.    . Multiple Vitamins-Minerals (MACULAR VITAMIN BENEFIT PO) Take 1 capsule by mouth 2 (two) times daily.    Marland Kitchen NIFEdipine (PROCARDIA XL/ADALAT-CC) 60 MG 24 hr tablet Take 1 tablet (60 mg total) by mouth daily.  . NON FORMULARY Allergy shots once every other week  . Omega-3 Fatty Acids (FISH OIL PO) Take by mouth daily.  . pantoprazole (PROTONIX) 40 MG tablet TAKE 1 TABLET (40 MG TOTAL) BY MOUTH 2 (TWO) TIMES DAILY. FOR STOMACH  . pravastatin (PRAVACHOL) 40 MG tablet TAKE 1 TABLET BY MOUTH EVERY DAY  . psyllium (METAMUCIL) 58.6 % packet Take 1 packet by mouth daily as needed (constipation).  . ranitidine (ZANTAC) 300 MG tablet Take 300 mg by mouth at bedtime.  Marland Kitchen SPIRIVA HANDIHALER 18 MCG inhalation capsule 18 mcg 2 (two) times daily.  . vitamin B-12 (CYANOCOBALAMIN) 1000 MCG tablet Take 1,000 mcg by mouth daily.   No facility-administered encounter medications on file as of 06/08/2018.     Review of Systems:  Review of Systems  HENT: Positive for postnasal drip.   Respiratory: Positive for cough.   Allergic/Immunologic: Positive for environmental allergies.  Psychiatric/Behavioral: The patient is not nervous/anxious.   All other systems reviewed and are negative.   Health Maintenance  Topic Date Due  . Hepatitis C Screening  08/29/2018 (Originally Jul 30, 1946)  . COLONOSCOPY  06/12/2018  . MAMMOGRAM  12/17/2019  . TETANUS/TDAP  08/22/2026  . INFLUENZA VACCINE  Completed  . DEXA SCAN  Completed  . PNA vac Low Risk Adult  Completed    Physical Exam: Vitals:   06/08/18 1108  BP: 122/70  Pulse: 75  Temp: 98.3 F (36.8 C)  TempSrc: Oral  SpO2: 97%  Weight: 217 lb (98.4 kg)  Height: 5\' 3"  (1.6 m)    Body mass index is 38.44 kg/m. Physical Exam  Constitutional: She is oriented to person, place, and time. She appears well-developed and well-nourished.  Neurological: She is alert and oriented to person, place, and time.  Skin: Skin is warm and dry. No rash noted.  Psychiatric: She has a normal mood and affect. Her behavior is normal. Judgment and thought content normal.    Labs reviewed: Basic Metabolic Panel: Recent Labs    10/11/17 1221 12/25/17 0900 04/30/18 0839  NA 138 140 142  K 3.8 4.0 4.0  CL 103 108 107  CO2 28 26 27   GLUCOSE 124* 101* 85  BUN 13 15 13   CREATININE 0.95 0.85 0.94*  CALCIUM 10.1 9.4 9.6  TSH 1.30  --   --    Liver Function Tests: Recent Labs    08/25/17 0856 10/11/17 1221 12/25/17 0900 04/30/18 0839  AST 21 20  --  23  ALT 14 15 15 13   ALKPHOS  --  156*  --   --   BILITOT 0.5 0.5  --  0.4  PROT 7.3 8.3  --  7.0  ALBUMIN  --  4.2  --   --    No results for input(s): LIPASE, AMYLASE in the last 8760 hours. No results for input(s): AMMONIA in the last 8760 hours. CBC: Recent Labs    08/25/17 0856 10/11/17 1221  WBC 5.9 6.1  NEUTROABS 2,708 3.2  HGB 11.8 12.2  HCT 36.8 37.6  MCV 79.0* 81.6  PLT 278 289.0   Lipid Panel: Recent Labs    08/25/17 0856 12/25/17 0900 04/30/18 0839  CHOL 169 168 160  HDL 63 56 52  LDLCALC 89 95 89  TRIG 83 76 99  CHOLHDL 2.7 3.0 3.1   Lab Results  Component Value Date   HGBA1C 5.6 05/26/2014    Procedures since last visit: No results found.  Assessment/Plan   ICD-10-CM   1. Adjustment disorder with anxious mood F43.22    improving  2. Chronic cough R05 chlorpheniramine-HYDROcodone (TUSSIONEX PENNKINETIC ER) 10-8 MG/5ML SUER   possibly related to #1 +/- asthma vs seasonal allergy      INCREASE CYMBALTA 60 MG (TAKE 2 CAPSULES OF 30MG ) DAILY FOR MOOD -call when you need new prescription sent to the [place] other medications as ordered  Follow up in 1 month with Jessica for cough and mood.  Gabreil Yonkers S. Perlie Gold  [facility] [address] [place], [place] [address] [phone] Cell (Monday-Friday 8 AM - 5 PM) [phone] After 5 PM and follow prompts

## 2018-06-08 NOTE — Patient Instructions (Signed)
INCREASE CYMBALTA 60 MG (TAKE 2 CAPSULES OF 30MG ) DAILY FOR MOOD -call when you need new prescription sent to the [place] other medications as ordered  Follow up in 1 month with Jessica for cough and mood.

## 2018-06-08 NOTE — Progress Notes (Signed)
Subjective:     Patient ID: Wendy Figueroa, female   DOB: 24-Oct-1945, 72 y.o.   MRN: [id number]  HPI 72 y/o BF here for a yearly follow up and med refills... he has multiple medical problems as noted below... She is an LPN... ~  SEE PREV EPIC NOTES FOR OLDER DATA >>   ~  August 28, 2012:  37moROV & Wendy Figueroa reports a good interval w/o new complaints or concerns...     AR> on Xyzal5, Singulair10, Omnaris, & allergy shots from DMangum seen 8/12 & his note is reviewed...    Asthma> on Symbicort160, NEBS w/ Albut, & Tussionex; she denies recent asthma exac, breathing well w/o wheezing, cough, SOB, etc...    HBP> on ProcXL60, Losar50, Lasix40, K20; BP= 140/70 & she denies CP, palpit, dizzy, ch in SOB/DOE, edema...    Chol> on Prav40 + FishOil; FLP shows TChol 162, TG 35, HDL 59, LDL 96    Overweight> wt down 5# to 215# today; we reviewed diet, exercise, wt reduction strategies...    GI> GERD, Divertics, Polyps, Elev AlkPhos> on Zegerid & followed by Wendy Figueroa; last colonoscopy was 7/06 & follow up planned 150yr    DJD> on Mobic15 as needed; she has been seen by Wendy Figueroa in the past w/ shot in the left knee which helped... 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    Asthma> on Symbicort160, NEBS w/ Albut, & Tussionex; she denies recent asthma exac, breathing well w/o wheezing, cough, SOB, etc; requests refill Tussionex...    HBP> on ProcXL60, Losar50, Lasix40, K20; BP= 124/84 & she denies CP, palpit, dizzy, ch in SOB/DOE, edema...    Chol> on Prav40 +  FishOil; FLP 2/15 shows TChol 151, TG 71, HDL 55, LDL 82    Overweight> wt up 2# to 217# today (BMI=37); we reviewed diet, exercise, wt reduction strategies...    GI> GERD, Divertics, Polyps, Elev AlkPhos> on Zegerid & followed by Wendy Figueroa; last colonoscopy was 7/06 & follow up planned 10y18yr  DJD> on Mobic15 as needed; she has been seen by Wendy Figueroa in the past w/ shot in the left knee which helped; now c/o right knee pain, we will Rx MOBIC15 7 refer to [place] Figueroa for XRays & eval... We reviewed prob list, meds, xrays and labs> see below for updates >> She had the 2014 flu vaccine in Oct2014... Requests refill tussionex...  LABS 2/15:  FLP- at goals on Prav40;  Chems- wnl x sl elev AlkPhos=145;  CBC- ok w/ Hg=12.6, MCV=83;  TSH=1.41...   ~  Jan 08, 2014:  3-10mo 83moand Wendy Figueroa 18mo h26moery bad allergies" despite her meds: Xyzal5Vertell Figueroa shots, Singulair10, Symbicort160, ProairHFA, and Tussionex;  She called Wendy Figueroa "but he would not refill my Tussionex and I need it" she says;  She notes cough, congestion, min sput, no hemoptysis, sl SOB/DOE w/o change, and denies CP/ palpit/ edema;  She doesn't like Pred rx...  Exam w/  sl decr BS bilat but no wheezing;  She denies reflux symptoms on her Zegerid rx;  CXR is clear, and PF is superphysiologic but the tracing is inaccurate;  We decided to treat w/ Depo80, Medrol dosepak, and continue current regimen as outlined...     We reviewed prob list, meds, xrays and labs> see below for updates >> She has arranged for Primary Care thru Wendy Figueroa at Wendy Figueroa...   CXR 5/15 showed normal heart size, clear lungs, NAD...   PFT 5/15 showed FVC=2.94 (120%), FEV1=2.53 (135%), %1sec=86, mid-flows=219% predicted, but her tracing shows a hitch & the numbers are not believed to be accurate despite mult attempts...  ~  December 16, 2014:  34moROV & LMaleiyareports doing reasonably well- she still follows w/ Wendy Figueroa Q637mo he fills most of her  meds;  States her breathing is pretty good, notes some cough & occas "spasms", SOB, wheezing;  She is followed by the Wendy Figueroa for her primary care needs w/ problems as listed below...     AR> on Xy[place]Singulair10, Flonases, & allergy shots from DrGreenwoodseen Q6m35mot we do not have recent notes to review...    Asthma> on Symbicort160, Spiriva daily, ProairHFA as needed, & Tussionex; she denies recent asthma exac, breathing well w/o wheezing, cough, SOB, etc; requests refill Tussionex... We reviewed prob list, meds, xrays and labs> see below for updates >>   ~  October 11, 2017:  9yr60yr & Wendy Figueroa having been re-consulted per Wendy Figueroa- Wendy Figueroa due to her chronic cough... There is a lot to get caught up on over the last 3 yrs>> We reviewed the interval Epic records as follows>     When I last saw the pt in 2016>  We were treating her long-standing chronic irritative cough w/ prn Tussionex;  She has chronic allergies followed by Wendy Figueroa on immunotherapy x yrs, plus Xyzal, Singulair10, & Flonase; and Asthma controlled on triple therapy w/ Symbicort160-2spBid & Spiriva once daily, with a rescue inhaler as needed...     She was followed by Wendy Figueroa- Wendy, Wendy Figueroa & last seen 10/11/16> followed for LPRD (hx HH & GERD), pharyngoesoph dysphagia (w/ prom cricopharyngeal bar), dysphonia (primary muscle tension), globus sensation & cough; they did extensive work-up & tried mult therapies including esoph dilatation, botox injections, Neurontin rx; they did repeat MBS- and offered UES dilatation, Botox injection, & poss CP myotomy & she opted for the direct laryngoscopy, esoph dilatation, and cricopharyngeal botox injection- this was done on 11/21/16; she reported improved after this treatment & they rec continued antireflux regimen & Neurontin 100=>300 Qhs...     She was HOSP 8/5 - 03/29/17 by Wendy Figueroa after presenting w/ sudden dyspnea- CXR was clear & she was hypoxemic, subseq CTA revealed  (submassive) bilat pulm emboli; 2DEcho showed EF=50-55% w/ RV dil & decr RVF; VenDopplers showed a superficial thrombosis of R-lesser saphenous vein, hypercoag panel was NEG; she was treated w/ heparin=> Xarelto...    [facility] improved overall, noted chr leg swelling 7 wearing TED hose,     She continued to f/u w/ Allergy-Wendy Figueroa 05/19/17> AR & Asthma- felt to be stable, no changes made & no mention of her cough...     In Jan 2019 she had f/u w/ PCP> c/o recurrent dysphagia & cough, stated swallowing worse despite Protonix40Bid=> rec f/u w/ Wendy Figueroa & Pulm...    She saw Wendy [facility]uero[facility] at [facility]  09/29/17> notes her long hx chronic cough, perrenial allergies, controlled asthma, GERD symptoms/ HH/ interm hoarseness, & dysphagia; they reviewed extensive records, performed flex laryngoscopy (essent neg), and concluded that the best approach was to proceed w/ cough suppressive therapy thru their speech & language pathology Figueroa, while continuing an antireflux regimen... (NOTE: pt declined to ret to [place] for the needed SLP cough suppression therapy)...  We reviewed the following medical problems during today's office visit>     Wendy Figueroa evaluation> Wendy- Wendy Figueroa, now DrMarcellino, and notes reviewed- hx LPRD (hx HH & GERD), pharyngoesoph dysphagia (w/ prom cricopharyngeal bar), dysphonia (primary muscle tension), globus sensation & cough; they did extensive work-up & tried mult therapies including esoph dilatation, botox injections, Neurontin rx...     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     AR> on Pace, Singulair10, Flonase, & allergy shots from [facility] who continues to monitor the patient regularly...    Asthma> on Symbicort160-2spBid, Spiriva  once daily, NEBS w/ Albut- prn;; she denies recent asthma exac, breathing well w/o wheezing, cough, SOB, etc...    Chronic cough> despite mult extensive evaluations in past- she has a persistent chr cough that requires Tussionex to suppress- she uses lozenges, cough drops, etc as well... She was prev easily managed w/ Tussionex prn-- we discussed re-checking CXR, Spirometry, Ambulatory Oximetry, & 2DEcho...    GI> GERD, Divertics, Polyps, Elev AlkPhos> on Protonix40Bid, Zantac300Qhs, & prev followed by Wendy Figueroa and last colonoscopy was 2014 w/ tubular adenoma removed; she was seen 01/2016 by DrDanis- constip w/ some fecal incontinence & rec to incr fiber...     Medical issues> managed by DrMCarter; HBP on Procardia, Losartan, Lasix, KCl;  HL on Pravastatin & Omega=3s; Overweight on diet & exercise program; DJD on OTC analgesics + shots from Figueroa; Anemic w/ Hg~11 & mcv~79 EXAM revealed Afeb, VSS, O2sat=98% on RA, Wt=225#, 5'3"Tall, BMI=40;  HEENT- neg, mallampati2;  Chest- decr BS bilat, clear w/o w/r/r;  Heart- RR w/o m/r/g;  Abd- obese, soft, nontender;  Ext- VI, tr-1+ edema w/o c/c;  Neuro- intact w/o focal abn...  CXR 10/11/17 (independently reviewed by me in the PACS system) showed norm heart size, clear lungs x min linear atx left lung base- NAD, kyphosis & DJD in Tspine...   Spirometry 10/11/17> FVC=2.25 (104%), FEV1=2.02 (121%), %1sec=90, mid-flows are superphysiologic at 223% predicted... The flow-vol loop tracing is poor however...  Ambulatory Oximetry 10/11/17>  O2sat=99% on RA at rest w/ pulse=92/min;  She ambulated 3 laps (185'ea) w/ lowest O2sat=98% w/ pulse=136/min...   LABS 10/11/17>  Chems- wnl x BS=124, Cr=0.95, LFTs wnl x AlkPhos=156;  CBC- wnl w/ Hg=12.2, mcv=82, eos=2.6%;  TSH=1.30;  BNP=19  2DEcho done 10/16/17>  Norm LV size & function w/ EF=55-60%, no regional wall motion abn, gr1DD, AoV- ok, mildly thickened leaflets w/o stenosis or regurg, MV- norm w/o stenosis or regurg, RV cavity  size & sys function were WNL, PAsys=32mHg... IMP/PLAN>>  Her CXR, Spirometry, O2sats, Labs, & 2DEcho are all good- 2D is now back to norm after 670mon Xarelto for her PTE;  I agree w/ DrMarcellino that it doesn't make sense to pursue additional diagnostic evaluation at this juncture & I agree w/ the recommendation for cough suppression therapy;  Pt refuses to go back to [place] for the SLP cough program & there  is nothing like this avail in [place];  I would simply recommend continuing her cough suppression w/ Tussionex or similar cough medication that has worked well for her in the past;  OK to slowly wean her bronchodilator therapy given her PFT results and lack of asthma exacerbations over the last several yrs;  Currently still on Xarelto & DrCarter's notes indicate that she plans to Rx for 19yrthen consider stopping the anticoag therapy... I would be happy to recheck pt in the future if desired...  Note:  >50% of this 674m appt was spent in counseling & coordination of care...   ~  November 27, 2017:  6wk ROV & pulmonary follow up>  Pt returns to review prev eval & discuss results which I was very pleased to do with her- she has AR, Hx asthma, and chr cough w/ LPRD, HH/GERD, pharyngoesoph dysphagia, dysphonia, & globus sensation;  As noted by Wendy Figueroa- she's had extensive work-up & mult treatment modalities tried, but she seems to do the best on prn Tussionex for her cough;  Her most serious medical issue is her hx of a submassive pulm embolism 03/2017=> hosp records reviewed; she has been on Xarelto since that time, currently '20mg'$ /d... Her PCP is Dr. MoGildardo Cranker.    We discussed her Wendy Figueroa evaluation from Wendy Figueroa in the past & DrMarcellino recently- she also rec cough suppression therapy & [facility] to [facility] but [place] to make the trip to [place] w/ the road construction presently underway; there is no similar program avail here in [place] & we reviewed her Tussionex rx...    She's been  counseled on the needed vigorous antireflux rgimen> Protonix40 taken 30 min before the 1st & last meals of the day, NPO after dinner, elev HOB 6"...    We reviewed her Allergy & Asthma history- PFTs have been good, no recent asthma exac, etc; she is controlled on Symbicort160-2spBid, Spiriva once daily, Singulair10, AlbutHFA rescue inhaler as needed, Flonase/ Xyzal/ allergy shots per Wendy Figueroa; we discussed weaning some of these meds as able...    EXAM revealed Afeb, VSS, O2sat=98% on RA, Wt=221#, 5'3"Tall, BMI=40;  HEENT- neg, mallampati2;  Chest- decr BS bilat, clear w/o w/r/r;  Heart- RR w/o m/r/g;  Abd- obese, soft, nontender;  Ext- VI, tr-1+ edema w/o c/c;  Neuro- intact w/o focal abn...  CXR & LABS from 09/2017 were reviewed... IMP/PLAN>>  We reviewed the need for DIET/ EXERCISE/ Wt reduction;  Recommended to continue current meds, Tussionex for prn use refilled, advised to start Guaifenesin and STAY ON THIS ('2400mg'$ /d in divided doses w/ fluids);  We discussed my retirement at the end of the year & f/u w/ me can be prn...    ~  June 05, 2018:  53m72mov & pulmonary follow up visit>              Problem List:  ALLERGIC RHINITIS (ICD-477.9) - followed by Wendy Figueroa on shots weekly... also takes FLONASE 2sp in each nostril daily,  SINGULAIR '10mg'$ /d,  XYZAL '5mg'$ /d... prev allergy tests + for trees, grass, ragweed, molds, dust, cat>dog, etc...  Hx of SINUSITIS (ICD-473.9) - no recent infections or antibiotics required.  ASTHMA (ICD-493.90) - Former pt of DrSlotnick yrs ago, also eval by [facility] [place] [employer] stable on SYMBICORT 160 2spBid, SPIRIVA daily, & VENTOLIN HFA as needed (Wendy Figueroa prescribes these meds)... she has min cough, small amt clear sputum and no recent wheezing, increased dyspnea, chest pain, etc... ~  CXR 11/10  showed norm heart size, ectatic/ calcif ao, clear lungs w/ min peribronch thickening, NAD...  ~  2/15: on Symbicort160, NEBS w/ Albut, & Tussionex; she denies recent  asthma exac, breathing well w/o wheezing, cough, SOB, etc; requests refill Tussionex. ~  5/15: c/o 19mohx "very bad allergies" despite her meds: Xyzal5, Omnaris, Allergy shots, Singulair10, Symbicort160, ProairHFA, and Tussionex; she notes cough, congestion, min sput, no hemoptysis, sl SOB/DOE w/o change, and denies CP/ palpit/ edema;  Exam w/ sl decr BS bilat but no wheezing;  She denies reflux symptoms on her Zegerid rx;  CXR is clear, and PF is superphysiologic but the tracing is inaccurate;  We decided to treat w/ Depo80, Medrol dosepak, and continue current regimen as outlined...  ~  CXR 5/15 showed normal heart size, clear lungs, NAD...  ~  PFT 5/15 showed FVC=2.94 (120%), FEV1=2.53 (135%), %1sec=86, mid-flows=219% predicted, but her tracing shows a hitch & the numbers are not believed to be accurate despite mult attempts... ~  4/16: on Symbicort160, Spiriva daily, ProairHFA as needed, & Tussionex; she denies recent asthma exac, breathing well w/o wheezing, cough, SOB, etc; requests refill Tussionex.  HYPERTENSION (ICD-401.9) - on PROCARDIA XL '60mg'$ /d,  LOSARTAN50, LASIX '40mg'$ /d,  KCl 223m- 2/d... ~  2/12:  add-on for elev BP at church exerc program> we added LOSARTAN '50mg'$ /d. ~  1/13:  BP= 136/80 and she denies HA, fatigue, visual changes, CP, palipit, dizziness, syncope, dyspnea, edema, etc... ~  1/14:  BP= 142/70 & she remains essentially asymptomatic...  ~  2/15: on ProcXL60, Losar50, Lasix40, K20; BP= 124/84 & she denies CP, palpit, dizzy, ch in SOB/DOE, edema. ~  She is followed by the SePal[facility] PrMaderaLosatan50+25, Lasix40... BP remains under good control.  VENOUS INSUFFICIENCY (ICD-459.81) - she follows low sodium diet, elevates legs, wears support hose, and takes the Lasix...  HYPERCHOLESTEROLEMIA, BORDERLINE (ICD-272.4) - on PRAVASTATIN '40mg'$ /d + low fat diet... ~  FLDerwood/07 showed TChol 190, TG 68, HDL 56, LDL 120 ~  FLP 7/09 showed TChol 202, TG 53, HDL 52, LDL 141...  rec> add Prav40. ~  FLStacy1/10 on Prav40 showed TChol 171, TG 70, HDL 57, LDL 100 ~  FLP 12/11 on Prav40 showed TChol 150, TG 36, HDL 56, LDL 87 ~  FLP 1/13 on Prav40 showed TChol 162, TG 38, HDL 63, LDL 91 ~  FLP 1/14 on Prav40 showed TChol 162, TG 35, HDL 59, LDL 96 ~  FLP 2/15 on Prav40 showed TChol 151, TG 71, HDL 55, LDL 82  ~  She is followed by the Wendy Figueroa...  OVERWEIGHT (ICD-278.02) - discussed low carb/ low fat diet and increased exercise program... ~  weight 7/09 is down 5# to 203#... she knows that she needs to do better... ~  weight 11/10 = 212# ~  weight 12/11 = 215# ~  weight 2/12 = 209# ~  Weight 1/13 = 220# & we reviewed low carb, low fat, wt reducing diet! ~  Weight 1/14 = 215#  ~  Weight 2/15 = 217# ~  Weight 4/16 = 216#  GERD (ICD-530.81) - on ZEGERID prn, & off prev reglan rx... EGD 9/02 showed sliding HH, mild gastritis, neg biopsy...  DIVERTICULOSIS OF COLON (ICD-562.10) + COLONIC POLYPS (ICD-211.3) >>  ~  Colonoscopy 7/06 by Wendy Figueroa showed extensive divertics, spasm, redund colon, hems, and sm polyp (no path avail)... 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 she never ret for the GGT. ~  labs 11/10 showed AlkPhos= 195 ( 39-117), GGT= 54 (7-51)... continue to follow. ~  labs 12/11 showed AlkPhos= 158 ~  Labs 1/13 showed AlkPhos= 152 ~  Labs 1/14 showed AlkPhos= 146 ~  Labs 2/15 showed AlkPhos= 145  DEGENERATIVE JOINT DISEASE (ICD-715.90) ~  11/10: c/o left knee pain & XRay shows joint effusion- trial MOBIC '15mg'$ /d & refer to Figueroa> seen by Wendy Figueroa & given knee injection.  DERM >> she has mult seborrheic keratoses, cherry red spots, and a sebaceous cyst on her back==> referred to [facility]. ~  Onychomycosis w/ periodic nail debridement by Mark Maintenance -  GYN= DrARoberts...  BMD at [facility] office 2005 was WNL w/ TScores -0.9 to +1.1; repeat 7/13 by Gyn was still wnl w/ lowest Tscore -0.9 in Spine... Mammograms neg at [facility] 7/14 (dense breasts)... s/p hyst, and A/P repair by DrRoberts 3/10... she had PNEUMOVAX in 2006 (age58) & repeated 12/11 (age64).   Past Surgical History:  Procedure Laterality Date  . ABDOMINAL HYSTERECTOMY  1988  . ANTERIOR AND POSTERIOR REPAIR  11/05/08  . APOGEE / PERIGEE REPAIR  10/2008   Dr. Octavio Manns  . bladder tack  2011  . BREAST SURGERY  1978   cyst removed from both removed  . COLONOSCOPY    . ESOPHAGOGASTRODUODENOSCOPY      Outpatient Encounter Medications as of 06/05/2018  Medication Sig  . albuterol (PROVENTIL HFA;VENTOLIN HFA) 108 (90 Base) MCG/ACT inhaler Inhale 2 puffs into the lungs every 6 (six) hours as needed for wheezing or shortness of breath.  . budesonide-formoterol (SYMBICORT) 160-4.5 MCG/ACT inhaler Inhale 2 puffs into the lungs 2 (two) times daily.  . Calcium Carb-Cholecalciferol (CALCIUM 600 + D PO) Take by mouth 2 (two) times daily. Take one tablet daily   . cetirizine (ZYRTEC) 10 MG tablet Take 10 mg by mouth daily.  . DULoxetine (CYMBALTA) 30 MG capsule TAKE 1 CAPSULE BY MOUTH EVERY DAY  . fluticasone (FLONASE) 50 MCG/ACT nasal spray 2 sprays daily.  . furosemide (LASIX) 40 MG tablet TAKE 1 TABLET BY MOUTH EVERY DAY  . KLOR-CON M20 20 MEQ tablet TAKE 2 TABLETS EVERY DAY FOR POTASSIUM SUPPLEMENT  . levocetirizine (XYZAL) 5 MG tablet Take 1 tablet (5 mg total) by mouth every evening.  Marland Kitchen losartan (COZAAR) 25 MG tablet TAKE 1 TABLET (25 MG TOTAL) BY MOUTH DAILY. TAKE WITH 50 MG TABLET FOR A TOTAL OF 75 MG DAILY.  Marland Kitchen losartan (COZAAR) 50 MG tablet TAKE ONE TABLET BY MOUTH ONCE DAILY ALONG WITH '25MG'$  FOR A TOTAL OF '75MG'$  DAILY  . montelukast (SINGULAIR) 10 MG tablet Take 10 mg by mouth at bedtime.    . Multiple Vitamins-Minerals (MACULAR VITAMIN BENEFIT PO) Take 1 capsule by mouth 2 (two) times daily.    Marland Kitchen  NIFEdipine (PROCARDIA XL/ADALAT-CC) 60 MG 24 hr tablet Take 1 tablet (60 mg total) by mouth daily.  . NON FORMULARY Allergy shots once every other week  . Omega-3 Fatty Acids (FISH OIL PO) Take by mouth daily.  . pantoprazole (PROTONIX) 40 MG tablet TAKE 1 TABLET (40 MG TOTAL) BY MOUTH 2 (TWO) TIMES DAILY. FOR STOMACH  . pravastatin (PRAVACHOL) 40 MG tablet TAKE 1 TABLET BY MOUTH EVERY DAY  . psyllium (METAMUCIL) 58.6 % packet Take 1 packet by mouth daily as needed (constipation).  . ranitidine (ZANTAC) 300 MG tablet Take 300 mg by  mouth at bedtime.  Marland Kitchen SPIRIVA HANDIHALER 18 MCG inhalation capsule 18 mcg 2 (two) times daily.  . vitamin B-12 (CYANOCOBALAMIN) 1000 MCG tablet Take 1,000 mcg by mouth daily.  . [DISCONTINUED] chlorpheniramine-HYDROcodone (TUSSIONEX PENNKINETIC ER) 10-8 MG/5ML SUER Take 5 mLs by mouth every 12 (twelve) hours as needed for cough.  . [DISCONTINUED] chlorpheniramine-HYDROcodone (TUSSIONEX PENNKINETIC ER) 10-8 MG/5ML SUER Take 5 mLs by mouth every 12 (twelve) hours as needed for cough.   No facility-administered encounter medications on file as of 06/05/2018.     Allergies  Allergen Reactions  . Demerol Hives    All over the body   . Lisinopril-Hydrochlorothiazide Hives    All over the body  . Penicillins Hives    Has patient had a PCN reaction causing immediate rash, facial/tongue/throat swelling, SOB or lightheadedness with hypotension: No Has patient had a PCN reaction causing severe rash involving mucus membranes or skin necrosis: No Has patient had a PCN reaction that required hospitalization: No Has patient had a PCN reaction occurring within the last 10 years: No If all of the above answers are "NO", then may proceed with Cephalosporin use.   All over the body    Immunization History  Administered Date(s) Administered  . Influenza Split 06/11/2011, 05/27/2013  . Influenza Whole 07/05/2010, 04/23/2012  . Influenza, High Dose Seasonal PF 04/30/2018  .  Influenza,inj,Quad PF,6+ Mos 05/29/2015, 05/06/2016, 05/17/2017  . Influenza-Unspecified 05/22/2014, 10/26/2015  . Pneumococcal Conjugate-13 10/14/2014  . Pneumococcal Polysaccharide-23 07/29/2010  . Tdap 08/22/2016    Current Medications, Allergies, Past Medical History, Past Surgical History, Family History, and Social History were reviewed in [employer] record.    Review of Systems         See HPI - all other systems neg except as noted... The patient complains of dyspnea on exertion.  The patient denies anorexia, fever, weight loss, weight gain, vision loss, decreased hearing, hoarseness, chest pain, syncope, peripheral edema, prolonged cough, headaches, hemoptysis, abdominal pain, melena, hematochezia, severe indigestion/heartburn, hematuria, incontinence, muscle weakness, suspicious skin lesions, transient blindness, difficulty walking, depression, unusual weight change, abnormal bleeding, enlarged lymph nodes, and angioedema.   Objective:   Physical Exam     WD, WN, 72 y/o BF in NAD... GENERAL:  Alert & oriented; pleasant & cooperative... HEENT:  [place]/AT, EOM-wnl, PERRLA, EACs-clear, TMs-wnl, NOSE-clear, THROAT-clear & wnl. NECK:  Supple w/ fairROM; no JVD; normal carotid impulses w/o bruits; no thyromegaly or nodules palpated; no lymphadenopathy. CHEST:  Clear to P & A; without wheezes/ rales/ or rhonchi heard... HEART:  Regular Rhythm; without murmurs/ rubs/ or gallops detected... ABDOMEN:  Soft & nontender; normal bowel sounds; no organomegaly or masses palpated.... EXT: without deformities, mild arthritic changes & decr ROM left knee; no varicose veins/ +venous insuffic/ 2+edema. NEURO:  CN's intact; no focal neuro deficits... DERM:  No lesions noted; no rash etc...  RADIOLOGY DATA:  Reviewed in the EPIC EMR & discussed w/ the patient...  LABORATORY DATA:  Reviewed in the EPIC EMR & discussed w/ the patient...   Assessment:      10/11/17>   Her  CXR, Spirometry, O2sats, Labs, & 2DEcho are all good- 2D is now back to norm after 47moon Xarelto for her PTE;  I agree w/ DrMarcellino that it doesn't make sense to pursue additional diagnostic evaluation at this juncture & I agree w/ the recommendation for cough suppression therapy;  Pt refuses to go back to [place] for the SLP cough program & there is nothing  like this avail in [place];  I would simply recommend continuing her cough suppression w/ Tussionex or similar cough medication that has worked well for her in the past;  OK to slowly wean her bronchodilator therapy given her PFT results and lack of asthma exacerbations over the last several yrs;  Currently still on Xarelto & DrCarter's notes indicate that she plans to Rx for 72yrthen consider stopping the anticoag therapy... I would be happy to recheck pt in the future if desired. 11/27/17>   We reviewed the need for DIET/ EXERCISE/ Wt reduction;  Recommended to continue current meds, Tussionex for prn use refilled, advised to start Guaifenesin and STAY ON THIS ('2400mg'$ /d in divided doses w/ fluids);  We discussed my retirement at the end of the year & f/u w/ me can be prn.   AR>  On allergy shots & meds from [facility] continue same... Asthma>  On meds as listed, breathing is stable and rec to slowly wean bronchodil meds... PTE 8/18>  On Xarelto, 2DEcho 2/19 shows resolution of the right heart strain... Chr cough>  See discussion above- rec to continue cough suppresssion therapy as needed...   Wendy Figueroa Eval & Rx from [facility] Figuero[facility], DrMarcellino   HBP>              [facility]... VI/ Edema>  Chol>  Overweight>  GI> per GI, DrDanis... DJD>     Plan:     Patient's Medications  New Prescriptions   No medications on file  Previous Medications   ALBUTEROL (PROVENTIL HFA;VENTOLIN HFA) 108 (90 BASE) MCG/ACT INHALER    Inhale 2 puffs into the lungs every 6 (six) hours as needed for wheezing or shortness of breath.    BUDESONIDE-FORMOTEROL (SYMBICORT) 160-4.5 MCG/ACT INHALER    Inhale 2 puffs into the lungs 2 (two) times daily.   CALCIUM CARB-CHOLECALCIFEROL (CALCIUM 600 + D PO)    Take by mouth 2 (two) times daily. Take one tablet daily    CETIRIZINE (ZYRTEC) 10 MG TABLET    Take 10 mg by mouth daily.   DULOXETINE (CYMBALTA) 30 MG CAPSULE    TAKE 1 CAPSULE BY MOUTH EVERY DAY   FLUTICASONE (FLONASE) 50 MCG/ACT NASAL SPRAY    2 sprays daily.   FUROSEMIDE (LASIX) 40 MG TABLET    TAKE 1 TABLET BY MOUTH EVERY DAY   KLOR-CON M20 20 MEQ TABLET    TAKE 2 TABLETS EVERY DAY FOR POTASSIUM SUPPLEMENT   LEVOCETIRIZINE (XYZAL) 5 MG TABLET    Take 1 tablet (5 mg total) by mouth every evening.   LOSARTAN (COZAAR) 25 MG TABLET    TAKE 1 TABLET (25 MG TOTAL) BY MOUTH DAILY. TAKE WITH 50 MG TABLET FOR A TOTAL OF 75 MG DAILY.   LOSARTAN (COZAAR) 50 MG TABLET    TAKE ONE TABLET BY MOUTH ONCE DAILY ALONG WITH '25MG'$  FOR A TOTAL OF '75MG'$  DAILY   MONTELUKAST (SINGULAIR) 10 MG TABLET    Take 10 mg by mouth at bedtime.     MULTIPLE VITAMINS-MINERALS (MACULAR VITAMIN BENEFIT PO)    Take 1 capsule by mouth 2 (two) times daily.     NIFEDIPINE (PROCARDIA XL/ADALAT-CC) 60 MG 24 HR TABLET    Take 1 tablet (60 mg total) by mouth daily.   NON FORMULARY    Allergy shots once every other week   OMEGA-3 FATTY ACIDS (FISH OIL PO)    Take by mouth daily.   PANTOPRAZOLE (PROTONIX) 40 MG TABLET  TAKE 1 TABLET (40 MG TOTAL) BY MOUTH 2 (TWO) TIMES DAILY. FOR STOMACH   PRAVASTATIN (PRAVACHOL) 40 MG TABLET    TAKE 1 TABLET BY MOUTH EVERY DAY   PSYLLIUM (METAMUCIL) 58.6 % PACKET    Take 1 packet by mouth daily as needed (constipation).   RANITIDINE (ZANTAC) 300 MG TABLET    Take 300 mg by mouth at bedtime.   SPIRIVA HANDIHALER 18 MCG INHALATION CAPSULE    18 mcg 2 (two) times daily.   VITAMIN B-12 (CYANOCOBALAMIN) 1000 MCG TABLET    Take 1,000 mcg by mouth daily.  Modified Medications   Modified Medication Previous Medication    CHLORPHENIRAMINE-HYDROCODONE (TUSSIONEX PENNKINETIC ER) 10-8 MG/5ML SUER chlorpheniramine-HYDROcodone (TUSSIONEX PENNKINETIC ER) 10-8 MG/5ML SUER      Take 5 mLs by mouth every 12 (twelve) hours as needed for cough.    Take 5 mLs by mouth every 12 (twelve) hours as needed for cough.  Discontinued Medications   No medications on file

## 2018-06-14 ENCOUNTER — Other Ambulatory Visit: Payer: Self-pay | Admitting: Internal Medicine

## 2018-06-18 ENCOUNTER — Encounter: Payer: Self-pay | Admitting: Gastroenterology

## 2018-06-22 DIAGNOSIS — J3081 Allergic rhinitis due to animal (cat) (dog) hair and dander: Secondary | ICD-10-CM | POA: Diagnosis not present

## 2018-06-22 DIAGNOSIS — J301 Allergic rhinitis due to pollen: Secondary | ICD-10-CM | POA: Diagnosis not present

## 2018-06-22 DIAGNOSIS — J3089 Other allergic rhinitis: Secondary | ICD-10-CM | POA: Diagnosis not present

## 2018-07-06 DIAGNOSIS — J3089 Other allergic rhinitis: Secondary | ICD-10-CM | POA: Diagnosis not present

## 2018-07-06 DIAGNOSIS — J3081 Allergic rhinitis due to animal (cat) (dog) hair and dander: Secondary | ICD-10-CM | POA: Diagnosis not present

## 2018-07-06 DIAGNOSIS — J301 Allergic rhinitis due to pollen: Secondary | ICD-10-CM | POA: Diagnosis not present

## 2018-07-12 ENCOUNTER — Other Ambulatory Visit: Payer: Self-pay | Admitting: *Deleted

## 2018-07-12 MED ORDER — LOSARTAN POTASSIUM 25 MG PO TABS: 25.0000 mg | ORAL_TABLET | Freq: Every day | ORAL | 1 refills | Status: DC

## 2018-07-12 MED ORDER — LOSARTAN POTASSIUM 50 MG PO TABS: ORAL_TABLET | ORAL | 1 refills | Status: DC

## 2018-07-12 NOTE — Telephone Encounter (Signed)
Pended Rx and sent to [facility] for approval  [place] Warning.

## 2018-07-17 ENCOUNTER — Encounter: Payer: Self-pay | Admitting: Nurse Practitioner

## 2018-07-17 ENCOUNTER — Ambulatory Visit (INDEPENDENT_AMBULATORY_CARE_PROVIDER_SITE_OTHER): Payer: PPO | Admitting: Nurse Practitioner

## 2018-07-17 VITALS — BP 118/76 | HR 97 | Temp 98.2°F | Ht 63.0 in | Wt 219.0 lb

## 2018-07-17 DIAGNOSIS — R600 Localized edema: Secondary | ICD-10-CM | POA: Diagnosis not present

## 2018-07-17 DIAGNOSIS — J301 Allergic rhinitis due to pollen: Secondary | ICD-10-CM | POA: Diagnosis not present

## 2018-07-17 DIAGNOSIS — E785 Hyperlipidemia, unspecified: Secondary | ICD-10-CM

## 2018-07-17 DIAGNOSIS — F4322 Adjustment disorder with anxiety: Secondary | ICD-10-CM | POA: Diagnosis not present

## 2018-07-17 DIAGNOSIS — I10 Essential (primary) hypertension: Secondary | ICD-10-CM

## 2018-07-17 DIAGNOSIS — R05 Cough: Secondary | ICD-10-CM

## 2018-07-17 DIAGNOSIS — J4520 Mild intermittent asthma, uncomplicated: Secondary | ICD-10-CM

## 2018-07-17 DIAGNOSIS — J3089 Other allergic rhinitis: Secondary | ICD-10-CM | POA: Diagnosis not present

## 2018-07-17 DIAGNOSIS — K219 Gastro-esophageal reflux disease without esophagitis: Secondary | ICD-10-CM | POA: Diagnosis not present

## 2018-07-17 DIAGNOSIS — R053 Chronic cough: Secondary | ICD-10-CM

## 2018-07-17 DIAGNOSIS — J3081 Allergic rhinitis due to animal (cat) (dog) hair and dander: Secondary | ICD-10-CM | POA: Diagnosis not present

## 2018-07-17 MED ORDER — HYDROCOD POLST-CPM POLST ER 10-8 MG/5ML PO SUER: 5.0000 mL | Freq: Two times a day (BID) | ORAL | 0 refills | Status: DC | PRN

## 2018-07-17 MED ORDER — DULOXETINE HCL 60 MG PO CPEP: 60.0000 mg | ORAL_CAPSULE | Freq: Every day | ORAL | 1 refills | Status: DC

## 2018-07-17 NOTE — Progress Notes (Signed)
Careteam: Patient Care Team: Lauree Chandler, NP as PCP - General (Geriatric Medicine) Monna Fam, MD as Consulting Physician (Ophthalmology)  Advanced Directive information Does Patient Have a Medical Advance Directive?: No  Allergies  Allergen Reactions  . Demerol Hives    All over the body   . Lisinopril-Hydrochlorothiazide Hives    All over the body  . Penicillins Hives    Has patient had a PCN reaction causing immediate rash, facial/tongue/throat swelling, SOB or lightheadedness with hypotension: No Has patient had a PCN reaction causing severe rash involving mucus membranes or skin necrosis: No Has patient had a PCN reaction that required hospitalization: No Has patient had a PCN reaction occurring within the last 10 years: No If all of the above answers are "NO", then may proceed with Cephalosporin use.   All over the body    Chief Complaint  Patient presents with  . Follow-up    Pt is being seen for a 1 month follow up due to cough and mood. Pt reports that the cough has only gotten a little better.   . Medication Refill    rx pended for 60 mg cymbalta and tussionex cough syrup     HPI: Patient is a 72 y.o. female seen in the office today for follow up on mood.   Chronic cough- pt has had allergies and cough for a long has she remembers. has been seen by allergies, pulmonary and ENT due to chronic cough. Has been given exercises and intervention. Now she is on tussionex which helps suppress cough but does not like taking it. Taking it every night which does help her but not resolved it.   Anxiety and depression- using cymbalta 60 mg daily- states her mood has been good. Questioned if the cough was coming from her nerves but there is not much difference.   Allergies/asthma- using flonase daily and zyrtec and xyzal also using spiriva and symbicort and singulair.   htn- stable on losartan 75 mg daily, procardia, lasix 40 mg daily with potassium supplement     GERD- using protonix and zantac to help control symptoms. Does not help cough.   Hyperlipidemia- pravastatin 40 mg daily, LDL 89 in September 19 and has made dietary changes.   Review of Systems:  Review of Systems  Constitutional: Negative for chills, fever and weight loss.  HENT: Negative for tinnitus.   Respiratory: Positive for cough. Negative for sputum production and shortness of breath.   Cardiovascular: Negative for chest pain, palpitations and leg swelling.  Gastrointestinal: Negative for abdominal pain, constipation, diarrhea and heartburn.  Genitourinary: Negative for dysuria, frequency and urgency.  Musculoskeletal: Positive for joint pain. Negative for back pain, falls and myalgias.  Skin: Negative.   Neurological: Negative for dizziness and headaches.  Psychiatric/Behavioral: Negative for depression and memory loss. The patient is nervous/anxious. The patient does not have insomnia.     Past Medical History:  Diagnosis Date  . Abnormal Pap smear    Over 58yrs ago  . ALKALINE PHOSPHATASE, ELEVATED 07/01/2009   Qualifier: Diagnosis of  By: [name] MD, [name]   . Allergic rhinitis   . Anemia   . Arthritis   . Asthma   . Cough    occasional  . Cystocele 11/05/08  . Diverticulosis of colon   . DJD (degenerative joint disease)    no per pt  . Elevated alkaline phosphatase level   . GERD (gastroesophageal reflux disease)   . H/O urinary incontinence 11/06/2008  .  H/O varicella   . Hard measles   . Hx of colonic polyps   . Hypercholesterolemia   . Hypertension   . Leg swelling    occasional  . 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 Overweight(278.02)   . Rectocele 11/06/2008   Large  . Sebaceous cyst    on back  . Sinusitis   . Venous insufficiency   . Wheezing    occasional  . Yeast infection    Past Surgical History:  Procedure Laterality Date  . ABDOMINAL HYSTERECTOMY  1988  . ANTERIOR AND POSTERIOR REPAIR  11/05/08  . APOGEE / PERIGEE  REPAIR  10/2008   Dr. Octavio Manns  . bladder tack  2011  . BREAST SURGERY  1978   cyst removed from both removed  . COLONOSCOPY    . ESOPHAGOGASTRODUODENOSCOPY     Social History:   reports that she has never smoked. She has never used smokeless tobacco. She reports that she drinks about 2.0 standard drinks of alcohol per week. She reports that she does not use drugs.  Family History  Problem Relation Age of Onset  . Aneurysm Mother   . Lung cancer Father   . Hypertension Daughter   . Hypertension Daughter   . Colon cancer Neg Hx   . Esophageal cancer Neg Hx   . Stomach cancer Neg Hx   . Rectal cancer Neg Hx   . Colon polyps Neg Hx     Medications: Patient's Medications  New Prescriptions   No medications on file  Previous Medications   ALBUTEROL (PROVENTIL HFA;VENTOLIN HFA) 108 (90 BASE) MCG/ACT INHALER    Inhale 2 puffs into the lungs every 6 (six) hours as needed for wheezing or shortness of breath.   BUDESONIDE-FORMOTEROL (SYMBICORT) 160-4.5 MCG/ACT INHALER    Inhale 2 puffs into the lungs 2 (two) times daily.   CALCIUM CARB-CHOLECALCIFEROL (CALCIUM 600 + D PO)    Take by mouth 2 (two) times daily. Take one tablet daily    CETIRIZINE (ZYRTEC) 10 MG TABLET    Take 10 mg by mouth daily.   CHLORPHENIRAMINE-HYDROCODONE (TUSSIONEX PENNKINETIC ER) 10-8 MG/5ML SUER    Take 5 mLs by mouth every 12 (twelve) hours as needed for cough.   DULOXETINE (CYMBALTA) 60 MG CAPSULE    Take 60 mg by mouth daily.   FLUTICASONE (FLONASE) 50 MCG/ACT NASAL SPRAY    2 sprays daily.   FUROSEMIDE (LASIX) 40 MG TABLET    TAKE 1 TABLET BY MOUTH EVERY DAY   KLOR-CON M20 20 MEQ TABLET    TAKE 2 TABLETS EVERY DAY FOR POTASSIUM SUPPLEMENT   LEVOCETIRIZINE (XYZAL) 5 MG TABLET    Take 1 tablet (5 mg total) by mouth every evening.   LOSARTAN (COZAAR) 25 MG TABLET    Take 1 tablet (25 mg total) by mouth daily. Take with 50 mg tablet for a total of 75 mg daily.   LOSARTAN (COZAAR) 50 MG TABLET    Take one tablet  by mouth once daily along with 25mg  for a total of 75mg  daily.   MONTELUKAST (SINGULAIR) 10 MG TABLET    Take 10 mg by mouth at bedtime.     MULTIPLE VITAMINS-MINERALS (MACULAR VITAMIN BENEFIT PO)    Take 1 capsule by mouth 2 (two) times daily.     NIFEDIPINE (PROCARDIA XL/ADALAT-CC) 60 MG 24 HR TABLET    Take 1 tablet (60 mg total) by mouth daily.   NON FORMULARY    Allergy shots once  every other week   OMEGA-3 FATTY ACIDS (FISH OIL PO)    Take by mouth daily.   PANTOPRAZOLE (PROTONIX) 40 MG TABLET    TAKE 1 TABLET (40 MG TOTAL) BY MOUTH 2 (TWO) TIMES DAILY. FOR STOMACH   PRAVASTATIN (PRAVACHOL) 40 MG TABLET    TAKE 1 TABLET BY MOUTH EVERY DAY   PSYLLIUM (METAMUCIL) 58.6 % PACKET    Take 1 packet by mouth daily as needed (constipation).   RANITIDINE (ZANTAC) 300 MG TABLET    Take 300 mg by mouth at bedtime.   SPIRIVA HANDIHALER 18 MCG INHALATION CAPSULE    18 mcg 2 (two) times daily.   VITAMIN B-12 (CYANOCOBALAMIN) 1000 MCG TABLET    Take 1,000 mcg by mouth daily.  Modified Medications   No medications on file  Discontinued Medications   DULOXETINE (CYMBALTA) 30 MG CAPSULE    TAKE 1 CAPSULE BY MOUTH EVERY DAY     Physical Exam:  Vitals:   07/17/18 0821  BP: 118/76  Pulse: 97  Temp: 98.2 F (36.8 C)  TempSrc: Oral  SpO2: 98%  Weight: 219 lb (99.3 kg)  Height: 5\' 3"  (1.6 m)   Body mass index is 38.79 kg/m.  Physical Exam  Constitutional: She is oriented to person, place, and time. She appears well-developed and well-nourished. No distress.  HENT:  Head: Normocephalic and atraumatic.  Mouth/Throat: Oropharynx is clear and moist. No oropharyngeal exudate.  Eyes: Pupils are equal, round, and reactive to light. Conjunctivae are normal.  Neck: Normal range of motion. Neck supple.  Cardiovascular: Normal rate, regular rhythm and normal heart sounds.  Pulmonary/Chest: Effort normal and breath sounds normal.  Abdominal: Soft. Bowel sounds are normal.  Musculoskeletal: She exhibits  no edema or tenderness.  Neurological: She is alert and oriented to person, place, and time.  Skin: Skin is warm and dry. She is not diaphoretic.  Psychiatric: She has a normal mood and affect.    Labs reviewed: Basic Metabolic Panel: Recent Labs    10/11/17 1221 12/25/17 0900 04/30/18 0839  NA 138 140 142  K 3.8 4.0 4.0  CL 103 108 107  CO2 28 26 27   GLUCOSE 124* 101* 85  BUN 13 15 13   CREATININE 0.95 0.85 0.94*  CALCIUM 10.1 9.4 9.6  TSH 1.30  --   --    Liver Function Tests: Recent Labs    08/25/17 0856 10/11/17 1221 12/25/17 0900 04/30/18 0839  AST 21 20  --  23  ALT 14 15 15 13   ALKPHOS  --  156*  --   --   BILITOT 0.5 0.5  --  0.4  PROT 7.3 8.3  --  7.0  ALBUMIN  --  4.2  --   --    No results for input(s): LIPASE, AMYLASE in the last 8760 hours. No results for input(s): AMMONIA in the last 8760 hours. CBC: Recent Labs    08/25/17 0856 10/11/17 1221  WBC 5.9 6.1  NEUTROABS 2,708 3.2  HGB 11.8 12.2  HCT 36.8 37.6  MCV 79.0* 81.6  PLT 278 289.0   Lipid Panel: Recent Labs    08/25/17 0856 12/25/17 0900 04/30/18 0839  CHOL 169 168 160  HDL 63 56 52  LDLCALC 89 95 89  TRIG 83 76 99  CHOLHDL 2.7 3.0 3.1   TSH: Recent Labs    10/11/17 1221  TSH 1.30   A1C: Lab Results  Component Value Date   HGBA1C 5.6 05/26/2014  Assessment/Plan .1. Chronic cough -ongoing, has been evaluated by pulmonary, GI, allergist, ENT without resolution. Taking tussionex is beneficial but expensive. Does not have side effects from medication.  - chlorpheniramine-HYDROcodone (TUSSIONEX PENNKINETIC ER) 10-8 MG/5ML SUER; Take 5 mLs by mouth every 12 (twelve) hours as needed for cough.  Dispense: 300 mL; Refill: 0  2. Adjustment disorder with anxious mood Helping with anxiety, no adverse rx noted.  - DULoxetine (CYMBALTA) 60 MG capsule; Take 1 capsule (60 mg total) by mouth daily.  Dispense: 180 capsule; Refill: 1  3. Mild intermittent asthma, unspecified  whether complicated Controlled on current regimen. Continues on symbicort and spiriva with singulair   4. Essential hypertension Controlled on current medication and lifestyle modifications. To continue current regimen  - CBC with Differential/Platelets; Future  5. Hyperlipidemia LDL goal <130 -continues on pravastatin and lifestyle modifications.  - Lipid panel; Future - COMPLETE METABOLIC PANEL WITH GFR; Future  6. Gastroesophageal reflux disease, esophagitis presence not specified -controlled on protonix and zantac   7. Lower Extremity Edema  -to continue lasix with potassium, encouraged compression hose.   Next appt: 4 months with lab work prior to visit.  Carlos American. [place], [facility] [phone]

## 2018-07-17 NOTE — Patient Instructions (Signed)
Use compression socks/hose during the day to help swelling

## 2018-08-03 DIAGNOSIS — J3081 Allergic rhinitis due to animal (cat) (dog) hair and dander: Secondary | ICD-10-CM | POA: Diagnosis not present

## 2018-08-03 DIAGNOSIS — J3089 Other allergic rhinitis: Secondary | ICD-10-CM | POA: Diagnosis not present

## 2018-08-03 DIAGNOSIS — J301 Allergic rhinitis due to pollen: Secondary | ICD-10-CM | POA: Diagnosis not present

## 2018-08-08 ENCOUNTER — Encounter (INDEPENDENT_AMBULATORY_CARE_PROVIDER_SITE_OTHER): Payer: Self-pay

## 2018-08-08 ENCOUNTER — Encounter: Payer: Self-pay | Admitting: Podiatry

## 2018-08-08 ENCOUNTER — Ambulatory Visit: Payer: PPO | Admitting: Podiatry

## 2018-08-08 DIAGNOSIS — M79675 Pain in left toe(s): Secondary | ICD-10-CM

## 2018-08-08 DIAGNOSIS — B351 Tinea unguium: Secondary | ICD-10-CM

## 2018-08-08 DIAGNOSIS — M79674 Pain in right toe(s): Secondary | ICD-10-CM

## 2018-08-08 NOTE — Progress Notes (Signed)
Complaint:  Visit Type: Patient returns to my office for continued preventative foot care services. Complaint: Patient states" my nails have grown long and thick and become painful to walk and wear shoes" . The patient presents for preventative foot care services. No changes to ROS  Podiatric Exam: Vascular: dorsalis pedis and posterior tibial pulses are palpable bilateral. Capillary return is immediate. Temperature gradient is WNL. Skin turgor WNL  Sensorium: Normal Semmes Weinstein monofilament test. Normal tactile sensation bilaterally. Nail Exam: Pt has thick disfigured discolored nails with subungual debris noted bilateral entire nail hallux through fifth toenails Ulcer Exam: There is no evidence of ulcer or pre-ulcerative changes or infection. Orthopedic Exam: Muscle tone and strength are WNL. No limitations in general ROM. No crepitus or effusions noted. Foot type and digits show no abnormalities. HAV with hammer toe  B/L. Skin: No Porokeratosis. No infection or ulcers  Diagnosis:  Onychomycosis, , Pain in right toe, pain in left toes  Treatment & Plan Procedures and Treatment: Consent by patient was obtained for treatment procedures.   Debridement of mycotic and hypertrophic toenails, 1 through 5 bilateral and clearing of subungual debris. No ulceration, no infection noted.  Return Visit-Office Procedure: Patient instructed to return to the office for a follow up visit 3 months for continued evaluation and treatment.    Danniell Rotundo DPM 

## 2018-08-17 DIAGNOSIS — J301 Allergic rhinitis due to pollen: Secondary | ICD-10-CM | POA: Diagnosis not present

## 2018-08-17 DIAGNOSIS — J3081 Allergic rhinitis due to animal (cat) (dog) hair and dander: Secondary | ICD-10-CM | POA: Diagnosis not present

## 2018-08-17 DIAGNOSIS — J3089 Other allergic rhinitis: Secondary | ICD-10-CM | POA: Diagnosis not present

## 2018-08-24 DIAGNOSIS — H1851 Endothelial corneal dystrophy: Secondary | ICD-10-CM | POA: Diagnosis not present

## 2018-08-24 DIAGNOSIS — H35033 Hypertensive retinopathy, bilateral: Secondary | ICD-10-CM | POA: Diagnosis not present

## 2018-08-24 DIAGNOSIS — H353132 Nonexudative age-related macular degeneration, bilateral, intermediate dry stage: Secondary | ICD-10-CM | POA: Diagnosis not present

## 2018-08-24 DIAGNOSIS — H40023 Open angle with borderline findings, high risk, bilateral: Secondary | ICD-10-CM | POA: Diagnosis not present

## 2018-08-31 DIAGNOSIS — J3089 Other allergic rhinitis: Secondary | ICD-10-CM | POA: Diagnosis not present

## 2018-08-31 DIAGNOSIS — J3081 Allergic rhinitis due to animal (cat) (dog) hair and dander: Secondary | ICD-10-CM | POA: Diagnosis not present

## 2018-08-31 DIAGNOSIS — J301 Allergic rhinitis due to pollen: Secondary | ICD-10-CM | POA: Diagnosis not present

## 2018-09-05 ENCOUNTER — Other Ambulatory Visit: Payer: Self-pay | Admitting: *Deleted

## 2018-09-05 DIAGNOSIS — R05 Cough: Secondary | ICD-10-CM

## 2018-09-05 DIAGNOSIS — R053 Chronic cough: Secondary | ICD-10-CM

## 2018-09-05 MED ORDER — HYDROCOD POLST-CPM POLST ER 10-8 MG/5ML PO SUER: 5.0000 mL | Freq: Two times a day (BID) | ORAL | 0 refills | Status: DC | PRN

## 2018-09-05 NOTE — Telephone Encounter (Signed)
Patient is requesting a refill on her Tussionex. Is this ok. Please Advise.   [place] Verified LR: 07/21/2018 Pended Rx and sent to [facility] for approval.

## 2018-09-14 DIAGNOSIS — J3081 Allergic rhinitis due to animal (cat) (dog) hair and dander: Secondary | ICD-10-CM | POA: Diagnosis not present

## 2018-09-14 DIAGNOSIS — J3089 Other allergic rhinitis: Secondary | ICD-10-CM | POA: Diagnosis not present

## 2018-09-14 DIAGNOSIS — J301 Allergic rhinitis due to pollen: Secondary | ICD-10-CM | POA: Diagnosis not present

## 2018-09-28 ENCOUNTER — Other Ambulatory Visit: Payer: Self-pay | Admitting: Pharmacist

## 2018-09-28 DIAGNOSIS — J301 Allergic rhinitis due to pollen: Secondary | ICD-10-CM | POA: Diagnosis not present

## 2018-09-28 DIAGNOSIS — J3081 Allergic rhinitis due to animal (cat) (dog) hair and dander: Secondary | ICD-10-CM | POA: Diagnosis not present

## 2018-09-28 DIAGNOSIS — J3089 Other allergic rhinitis: Secondary | ICD-10-CM | POA: Diagnosis not present

## 2018-09-28 NOTE — Patient Outreach (Signed)
[facility]) Care Management  Wendy Figueroa - Medication Adherence   09/28/2018  Wendy Figueroa 1945/09/21 [id number]  [facility] Medication: pravastatin 40 mg Date & Supply of last refill: 07/16/18, 90 day supply Current insurance:[employer]   Outreach:  Incoming call from [facility] [place] in response to the [facility] Medication Adherence Campaign. Speak with patient. HIPAA identifiers verified.  Subjective:   [name] reports that she sometimes misses doses of her pravastatin. Counsel patient on the importance of medication adherence. Patient reports that she currently takes her medications each day directly from her prescription bottles, which she stores on shelves in her bathroom. Counsel patient about the importance of storing bottles outside of the bathroom, away from heat and humidity. Counsel on strategies to improve adherence including pill packaging, weekly pillbox and use of a daily medication alarm. Patient expresses interest in using a weekly pillbox and setting an evening daily alarm, which she reports that her daughter can help her with setting up.   Patient denies any further medication questions/concerns at this time. Provide patient with my phone number.   Objective: Lab Results  Component Value Date   CREATININE 0.94 (H) 04/30/2018   CREATININE 0.85 12/25/2017   CREATININE 0.95 10/11/2017    Lab Results  Component Value Date   HGBA1C 5.6 05/26/2014    Lipid Panel     Component Value Date/Time   CHOL 160 04/30/2018 0839   CHOL 169 12/30/2015 0816   TRIG 99 04/30/2018 0839   HDL 52 04/30/2018 0839   HDL 61 12/30/2015 0816   CHOLHDL 3.1 04/30/2018 0839   VLDL 15 01/18/2017 0835   LDLCALC 89 04/30/2018 0839   LDLDIRECT 141.4 03/21/2008 0919    BP Readings from Last 3 Encounters:  07/17/18 118/76  06/08/18 122/70  06/05/18 132/80    Allergies  Allergen Reactions  . Demerol Hives    All over the body   .  Lisinopril-Hydrochlorothiazide Hives    All over the body  . Penicillins Hives    Has patient had a PCN reaction causing immediate rash, facial/tongue/throat swelling, SOB or lightheadedness with hypotension: No Has patient had a PCN reaction causing severe rash involving mucus membranes or skin necrosis: No Has patient had a PCN reaction that required hospitalization: No Has patient had a PCN reaction occurring within the last 10 years: No If all of the above answers are "NO", then may proceed with Cephalosporin use.   All over the body     Assessment:  Barrier identified: . Forgets to take - patient counseled on importance of adherence and strategies to aid with adherence.   Plan:  1) Patient to start using a weekly pillbox to organize her medications to improve adherence.  2) Will close pharmacy episode at this time.   Harlow Asa, PharmD, [place] [facility] [phone]

## 2018-10-11 DIAGNOSIS — J3089 Other allergic rhinitis: Secondary | ICD-10-CM | POA: Diagnosis not present

## 2018-10-11 DIAGNOSIS — J301 Allergic rhinitis due to pollen: Secondary | ICD-10-CM | POA: Diagnosis not present

## 2018-10-11 DIAGNOSIS — J3081 Allergic rhinitis due to animal (cat) (dog) hair and dander: Secondary | ICD-10-CM | POA: Diagnosis not present

## 2018-10-22 ENCOUNTER — Other Ambulatory Visit: Payer: Self-pay | Admitting: *Deleted

## 2018-10-22 DIAGNOSIS — R053 Chronic cough: Secondary | ICD-10-CM

## 2018-10-22 DIAGNOSIS — R05 Cough: Secondary | ICD-10-CM

## 2018-10-22 MED ORDER — HYDROCOD POLST-CPM POLST ER 10-8 MG/5ML PO SUER: 5.0000 mL | Freq: Two times a day (BID) | ORAL | 0 refills | Status: DC | PRN

## 2018-10-22 NOTE — Telephone Encounter (Signed)
Patient requested refill [place] Verified LR: 09/05/2018 Pended Rx and sent to [facility] for approval.

## 2018-10-23 ENCOUNTER — Telehealth: Payer: Self-pay | Admitting: *Deleted

## 2018-10-23 MED ORDER — LOSARTAN POTASSIUM 25 MG PO TABS: 75.0000 mg | ORAL_TABLET | Freq: Every day | ORAL | 1 refills | Status: DC

## 2018-10-23 NOTE — Telephone Encounter (Signed)
If she is taking losartan 50 mg and this is backordered okay to send in for losartan 25 mg 2 tablets by mouth daily

## 2018-10-23 NOTE — Telephone Encounter (Signed)
Patient notified and agreed. Stated that she will take 3 tablets of her 25mg  and will call when she needs a Rx called to pharmacy.  Medication list updated

## 2018-10-23 NOTE — Telephone Encounter (Signed)
Patient called and stated that her Losartan 50mg  is on backorder.  Is it ok to just send in her 25mg  and have her take 3 tablets daily instead. Please Advise.

## 2018-10-23 NOTE — Telephone Encounter (Signed)
[place] patient.

## 2018-10-23 NOTE — Telephone Encounter (Signed)
Oh I am sorry, yes 3 tablets daily to equal 75 mg losartan daily

## 2018-10-23 NOTE — Telephone Encounter (Signed)
Patient is currently taking a 25mg  tablet and a 50mg  tablet to equal 75mg .  Do you still only want her to take 2 tablets of the 25mg ? That's why in the message we asked if it was ok to take 3 tablets of the 25mg .  Please Advise.

## 2018-10-26 DIAGNOSIS — J3089 Other allergic rhinitis: Secondary | ICD-10-CM | POA: Diagnosis not present

## 2018-10-26 DIAGNOSIS — J3081 Allergic rhinitis due to animal (cat) (dog) hair and dander: Secondary | ICD-10-CM | POA: Diagnosis not present

## 2018-10-26 DIAGNOSIS — J301 Allergic rhinitis due to pollen: Secondary | ICD-10-CM | POA: Diagnosis not present

## 2018-11-01 DIAGNOSIS — J3081 Allergic rhinitis due to animal (cat) (dog) hair and dander: Secondary | ICD-10-CM | POA: Diagnosis not present

## 2018-11-01 DIAGNOSIS — J3089 Other allergic rhinitis: Secondary | ICD-10-CM | POA: Diagnosis not present

## 2018-11-01 DIAGNOSIS — J301 Allergic rhinitis due to pollen: Secondary | ICD-10-CM | POA: Diagnosis not present

## 2018-11-07 ENCOUNTER — Ambulatory Visit: Payer: PPO | Admitting: Podiatry

## 2018-11-09 ENCOUNTER — Other Ambulatory Visit: Payer: Self-pay

## 2018-11-09 ENCOUNTER — Other Ambulatory Visit: Payer: PPO

## 2018-11-09 DIAGNOSIS — I10 Essential (primary) hypertension: Secondary | ICD-10-CM

## 2018-11-09 DIAGNOSIS — J3089 Other allergic rhinitis: Secondary | ICD-10-CM | POA: Diagnosis not present

## 2018-11-09 DIAGNOSIS — E785 Hyperlipidemia, unspecified: Secondary | ICD-10-CM | POA: Diagnosis not present

## 2018-11-09 DIAGNOSIS — J3081 Allergic rhinitis due to animal (cat) (dog) hair and dander: Secondary | ICD-10-CM | POA: Diagnosis not present

## 2018-11-09 DIAGNOSIS — J301 Allergic rhinitis due to pollen: Secondary | ICD-10-CM | POA: Diagnosis not present

## 2018-11-10 LAB — CBC WITH DIFFERENTIAL/PLATELET
ABSOLUTE MONOCYTES: 492 {cells}/uL (ref 200–950)
BASOS PCT: 0.7 %
Basophils Absolute: 42 cells/uL (ref 0–200)
EOS PCT: 3.9 %
Eosinophils Absolute: 234 cells/uL (ref 15–500)
HCT: 34.4 % — ABNORMAL LOW (ref 35.0–45.0)
Hemoglobin: 11.5 g/dL — ABNORMAL LOW (ref 11.7–15.5)
LYMPHS ABS: 2934 {cells}/uL (ref 850–3900)
MCH: 27.1 pg (ref 27.0–33.0)
MCHC: 33.4 g/dL (ref 32.0–36.0)
MCV: 81.1 fL (ref 80.0–100.0)
MPV: 11 fL (ref 7.5–12.5)
Monocytes Relative: 8.2 %
NEUTROS ABS: 2298 {cells}/uL (ref 1500–7800)
NEUTROS PCT: 38.3 %
PLATELETS: 279 10*3/uL (ref 140–400)
RBC: 4.24 10*6/uL (ref 3.80–5.10)
RDW: 13.6 % (ref 11.0–15.0)
Total Lymphocyte: 48.9 %
WBC: 6 10*3/uL (ref 3.8–10.8)

## 2018-11-10 LAB — LIPID PANEL
Cholesterol: 155 mg/dL (ref <200)
HDL: 56 mg/dL (ref >=50)
LDL CHOLESTEROL (CALC): 84 mg/dL
Non-HDL Cholesterol (Calc): 99 mg/dL (calc) (ref <130)
Total CHOL/HDL Ratio: 2.8 (calc) (ref <5.0)
Triglycerides: 70 mg/dL (ref <150)

## 2018-11-10 LAB — COMPLETE METABOLIC PANEL WITH GFR
AG RATIO: 1.1 (calc) (ref 1.0–2.5)
ALT: 16 U/L (ref 6–29)
AST: 24 U/L (ref 10–35)
Albumin: 4 g/dL (ref 3.6–5.1)
Alkaline phosphatase (APISO): 174 U/L — ABNORMAL HIGH (ref 37–153)
BUN: 11 mg/dL (ref 7–25)
CALCIUM: 9.4 mg/dL (ref 8.6–10.4)
CHLORIDE: 107 mmol/L (ref 98–110)
CO2: 24 mmol/L (ref 20–32)
Creat: 0.87 mg/dL (ref 0.60–0.93)
GFR, EST NON AFRICAN AMERICAN: 65 mL/min/{1.73_m2} (ref >=60)
GFR, Est African American: 76 mL/min/{1.73_m2} (ref >=60)
GLUCOSE: 100 mg/dL — AB (ref 65–99)
Globulin: 3.5 g/dL (calc) (ref 1.9–3.7)
POTASSIUM: 4 mmol/L (ref 3.5–5.3)
Sodium: 141 mmol/L (ref 135–146)
Total Bilirubin: 0.5 mg/dL (ref 0.2–1.2)
Total Protein: 7.5 g/dL (ref 6.1–8.1)

## 2018-11-15 ENCOUNTER — Ambulatory Visit (INDEPENDENT_AMBULATORY_CARE_PROVIDER_SITE_OTHER): Payer: PPO | Admitting: Nurse Practitioner

## 2018-11-15 ENCOUNTER — Ambulatory Visit: Payer: PPO | Admitting: Nurse Practitioner

## 2018-11-15 ENCOUNTER — Other Ambulatory Visit: Payer: Self-pay

## 2018-11-15 ENCOUNTER — Other Ambulatory Visit: Payer: Self-pay | Admitting: Nurse Practitioner

## 2018-11-15 ENCOUNTER — Encounter: Payer: Self-pay | Admitting: Nurse Practitioner

## 2018-11-15 DIAGNOSIS — I10 Essential (primary) hypertension: Secondary | ICD-10-CM

## 2018-11-15 DIAGNOSIS — R05 Cough: Secondary | ICD-10-CM | POA: Diagnosis not present

## 2018-11-15 DIAGNOSIS — M8580 Other specified disorders of bone density and structure, unspecified site: Secondary | ICD-10-CM

## 2018-11-15 DIAGNOSIS — R748 Abnormal levels of other serum enzymes: Secondary | ICD-10-CM | POA: Diagnosis not present

## 2018-11-15 DIAGNOSIS — K219 Gastro-esophageal reflux disease without esophagitis: Secondary | ICD-10-CM | POA: Diagnosis not present

## 2018-11-15 DIAGNOSIS — Z1211 Encounter for screening for malignant neoplasm of colon: Secondary | ICD-10-CM | POA: Diagnosis not present

## 2018-11-15 DIAGNOSIS — I872 Venous insufficiency (chronic) (peripheral): Secondary | ICD-10-CM

## 2018-11-15 DIAGNOSIS — E785 Hyperlipidemia, unspecified: Secondary | ICD-10-CM | POA: Diagnosis not present

## 2018-11-15 DIAGNOSIS — D649 Anemia, unspecified: Secondary | ICD-10-CM | POA: Diagnosis not present

## 2018-11-15 DIAGNOSIS — J3089 Other allergic rhinitis: Secondary | ICD-10-CM

## 2018-11-15 DIAGNOSIS — R053 Chronic cough: Secondary | ICD-10-CM

## 2018-11-15 NOTE — Progress Notes (Signed)
Virtual Visit via Telephone Note  I connected with Wendy Figueroa on 11/15/18 at 10:00 AM EDT by telephone and verified that I am speaking with the correct person using two identifiers.   I discussed the limitations, risks, security and privacy concerns of performing an evaluation and management service by telephone and the availability of in person appointments. I also discussed with the patient that there may be a patient responsible charge related to this service. The patient expressed understanding and agreed to proceed.  Patient has a 4 month routine visit follow up   This service is provided via telemedicine  No vital signs collected/recorded due to the encounter was a telemedicine visit.   Location of patient (ex: home, work): Home   Patient consents to a telephone visit: Yes  Location of the provider (ex: office, home):Office  Name of any referring provider:  Sherrie Figueroa ,NP  Names of all persons participating in the telemedicine service and their role in the encounter:  [name] (patient), Wendy Mustache, NP and Vonna Kotyk CMA.  Time spent on call:  49mn     Careteam: Patient Care Team: ELauree Chandler NP as PCP - General (Geriatric Medicine) HMonna Fam MD as Consulting Physician (Ophthalmology)  Advanced Directive information Does Patient Have a Medical Advance Directive?: No, Would patient like information on creating a medical advance directive?: No - Patient declined  Allergies  Allergen Reactions  . Demerol Hives    All over the body   . Lisinopril-Hydrochlorothiazide Hives    All over the body  . Penicillins Hives    Has patient had a PCN reaction causing immediate rash, facial/tongue/throat swelling, SOB or lightheadedness with hypotension: No Has patient had a PCN reaction causing severe rash involving mucus membranes or skin necrosis: No Has patient had a PCN reaction that required hospitalization: No Has patient had a PCN reaction  occurring within the last 10 years: No If all of the above answers are "NO", then may proceed with Cephalosporin use.   All over the body    Chief Complaint  Patient presents with  . Medical Management of Chronic Issues    tele-visit, 4 month routine follow up      HPI: Patient is a 73y.o. female doing a tele-visit today due to COVID-19.   Chronic cough- pt has had allergies and cough for a long has she remembers. has been seen by allergies, pulmonary and ENT due to chronic cough. Has been given exercises and intervention. Now she is on tussionex which helps suppress cough but does not like taking it. Taking it every night which does help her but not resolved it. She has had to cut back on it due to insurance not paying for it.   Anxiety and depression- using cymbalta 60 mg daily- states her mood has been stable. Reports it is "hectic" with all the news. Used to getting out more.    Allergies/asthma- continues to use flonase daily and zyrtec and xyzal also using spiriva and symbicort and singulair. Chronic cough. Reports it is sometimes wet.   htn- stable on losartan 75 mg daily, procardia, lasix 40 mg daily with potassium supplement   GERD- using protonix and zantac to help control symptoms. Does not help cough.   Hyperlipidemia- pravastatin 40 mg daily, LDL 84. Has made dietary modifications.  Reports she is doing some exercise but not as much as she should.   htn- using nifedipine, losartan 75 mg, lasix 40 mg with potassium, takes blood pressure  occasionally at home. Last reading 138/80  Anemia- no signs of blood loss.  Continues on b12 supplement.  Review of Systems:  Review of Systems  Constitutional: Negative for chills, fever and weight loss.  HENT: Negative for tinnitus.   Respiratory: Positive for cough. Negative for sputum production and shortness of breath.   Cardiovascular: Negative for chest pain, palpitations and leg swelling.  Gastrointestinal: Negative  for abdominal pain, constipation, diarrhea and heartburn.  Genitourinary: Negative for dysuria, frequency and urgency.  Musculoskeletal: Positive for joint pain. Negative for back pain, falls and myalgias.  Skin: Negative.   Neurological: Negative for dizziness and headaches.  Psychiatric/Behavioral: Negative for depression and memory loss. The patient is nervous/anxious. The patient does not have insomnia.     Past Medical History:  Diagnosis Date  . Abnormal Pap smear    Over 11yr ago  . ALKALINE PHOSPHATASE, ELEVATED 07/01/2009   Qualifier: Diagnosis of  By: NLenna GilfordMD, SDeborra Medina  . Allergic rhinitis   . Anemia   . Arthritis   . Asthma   . Cough    occasional  . Cystocele 11/05/08  . Diverticulosis of colon   . DJD (degenerative joint disease)    no per pt  . Elevated alkaline phosphatase level   . GERD (gastroesophageal reflux disease)   . H/O urinary incontinence 11/06/2008  . H/O varicella   . Hard measles   . Hx of colonic polyps   . Hypercholesterolemia   . Hypertension   . Leg swelling    occasional  . 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 Overweight(278.02)   . Rectocele 11/06/2008   Large  . Sebaceous cyst    on back  . Sinusitis   . Venous insufficiency   . Wheezing    occasional  . Yeast infection    Past Surgical History:  Procedure Laterality Date  . ABDOMINAL HYSTERECTOMY  1988  . ANTERIOR AND POSTERIOR REPAIR  11/05/08  . APOGEE / PERIGEE REPAIR  10/2008   Dr. AOctavio Manns . bladder tack  2011  . BREAST SURGERY  1978   cyst removed from both removed  . COLONOSCOPY    . ESOPHAGOGASTRODUODENOSCOPY     Social History:   reports that she has never smoked. She has never used smokeless tobacco. She reports current alcohol use of about 2.0 standard drinks of alcohol per week. She reports that she does not use drugs.  Family History  Problem Relation Age of Onset  . Aneurysm Mother   . Lung cancer Father   . Hypertension Daughter   .  Hypertension Daughter   . Colon cancer Neg Hx   . Esophageal cancer Neg Hx   . Stomach cancer Neg Hx   . Rectal cancer Neg Hx   . Colon polyps Neg Hx     Medications: Patient's Medications  New Prescriptions   No medications on file  Previous Medications   ALBUTEROL (PROVENTIL HFA;VENTOLIN HFA) 108 (90 BASE) MCG/ACT INHALER    Inhale 2 puffs into the lungs every 6 (six) hours as needed for wheezing or shortness of breath.   BUDESONIDE-FORMOTEROL (SYMBICORT) 160-4.5 MCG/ACT INHALER    Inhale 2 puffs into the lungs 2 (two) times daily.   CALCIUM CARB-CHOLECALCIFEROL (CALCIUM 600 + D PO)    Take by mouth 2 (two) times daily. Take one tablet daily    CETIRIZINE (ZYRTEC) 10 MG TABLET    Take 10 mg by mouth daily.   CHLORPHENIRAMINE-HYDROCODONE ([facility]) 10-8  MG/5ML SUER    Take 5 mLs by mouth every 12 (twelve) hours as needed for cough.   DULOXETINE (CYMBALTA) 60 MG CAPSULE    Take 1 capsule (60 mg total) by mouth daily.   FLUTICASONE (FLONASE) 50 MCG/ACT NASAL SPRAY    2 sprays daily.   FUROSEMIDE (LASIX) 40 MG TABLET    TAKE 1 TABLET BY MOUTH EVERY DAY   KLOR-CON M20 20 MEQ TABLET    TAKE 2 TABLETS EVERY DAY FOR POTASSIUM SUPPLEMENT   LEVOCETIRIZINE (XYZAL) 5 MG TABLET    Take 1 tablet (5 mg total) by mouth every evening.   LOSARTAN (COZAAR) 25 MG TABLET    Take 3 tablets (75 mg total) by mouth daily.   MONTELUKAST (SINGULAIR) 10 MG TABLET    Take 10 mg by mouth at bedtime.     MULTIPLE VITAMINS-MINERALS (MACULAR VITAMIN BENEFIT PO)    Take 1 capsule by mouth 2 (two) times daily.     NEOMYCIN-POLYMYXIN B-DEXAMETHASONE (MAXITROL) 3.5-10000-0.1 OINT    Place 3.5 application into the left eye as needed.   NIFEDIPINE (PROCARDIA XL/ADALAT-CC) 60 MG 24 HR TABLET    Take 1 tablet (60 mg total) by mouth daily.   NON FORMULARY    Allergy shots once every other week   OMEGA-3 FATTY ACIDS (FISH OIL PO)    Take by mouth daily.   PANTOPRAZOLE (PROTONIX) 40 MG TABLET    TAKE 1 TABLET  (40 MG TOTAL) BY MOUTH 2 (TWO) TIMES DAILY. FOR STOMACH   PRAVASTATIN (PRAVACHOL) 40 MG TABLET    TAKE 1 TABLET BY MOUTH EVERY DAY   PSYLLIUM (METAMUCIL) 58.6 % PACKET    Take 1 packet by mouth daily as needed (constipation).   RANITIDINE (ZANTAC) 300 MG TABLET    Take 300 mg by mouth at bedtime.   SPIRIVA HANDIHALER 18 MCG INHALATION CAPSULE    18 mcg 2 (two) times daily.   VITAMIN B-12 (CYANOCOBALAMIN) 1000 MCG TABLET    Take 1,000 mcg by mouth daily.  Modified Medications   No medications on file  Discontinued Medications   No medications on file     Physical Exam:  Unable due to tele-visit.  Labs reviewed: Basic Metabolic Panel: Recent Labs    12/25/17 0900 04/30/18 0839 11/09/18 0813  NA 140 142 141  K 4.0 4.0 4.0  CL 108 107 107  CO2 _0 GLUCOSE 101* 85 100*  BUN _1 CREATININE 0.85 0.94* 0.87  CALCIUM 9.4 9.6 9.4   Liver Function Tests: Recent Labs    12/25/17 0900 04/30/18 0839 11/09/18 0813  AST  --  23 24  ALT _2 BILITOT  --  0.4 0.5  PROT  --  7.0 7.5   No results for input(s): LIPASE, AMYLASE in the last 8760 hours. No results for input(s): AMMONIA in the last 8760 hours. CBC: Recent Labs    11/09/18 0813  WBC 6.0  NEUTROABS 2,298  HGB 11.5*  HCT 34.4*  MCV 81.1  PLT 279   Lipid Panel: Recent Labs    12/25/17 0900 04/30/18 0839 11/09/18 0813  CHOL 168 160 155  HDL 56 52 56  LDLCALC 95 89 84  TRIG 76 99 70  CHOLHDL 3.0 3.1 2.8   TSH: No results for input(s): TSH in the last 8760 hours. A1C: Lab Results  Component Value Date   HGBA1C 5.6 05/26/2014     Assessment/Plan 1. Colon cancer screening - Ambulatory  referral to Gastroenterology, due for 5 years follow up .  2. Hyperlipidemia LDL goal <130 At goal, continues on stain with lifestyle modifications.   3. Essential hypertension Stable, continues on nifedipine, losartan and lasix  4. Venous (peripheral) insufficiency -stable, without swelling at  this time, continues on lasix  5. Gastroesophageal reflux disease without esophagitis Stable, continues on protonix and zantac  6. Osteopenia, unspecified location -continues on cal and vit d, due for dexa scan which she plans on obtaining after COVID   7. Chronic cough Stable, takes tussinonex chronically which helps symptom management.   8. Other allergic rhinitis Stable at this time. Continues to cough chronically. To continue current regimen.   9. Alkaline phosphatase elevation -appears she has had chronic elevation in alk phos ~10 years. ggt was normal with last check, will obtain further labs with next lab draw - Alkaline Phosphatase Isoenzymes; Future - COMPLETE METABOLIC PANEL WITH GFR; Future  10. Anemia, unspecified type - CBC with Differential/Platelet; Future  Next appt: 4 months with lab work before visit.  Carlos American. Harle Battiest  [facility] [phone]   Follow Up Instructions:    I discussed the assessment and treatment plan with the patient. The patient was provided an opportunity to ask questions and all were answered. The patient agreed with the plan and demonstrated an understanding of the instructions.   The patient was advised to call back or seek an in-person evaluation if the symptoms worsen or if the condition fails to improve as anticipated.  I provided minutes of non-face-to-face time during this encounter.   Avs printed and mailed

## 2018-11-21 ENCOUNTER — Other Ambulatory Visit: Payer: Self-pay

## 2018-11-21 ENCOUNTER — Ambulatory Visit: Payer: PPO | Admitting: Podiatry

## 2018-11-21 ENCOUNTER — Encounter: Payer: Self-pay | Admitting: Podiatry

## 2018-11-21 VITALS — Temp 98.2°F

## 2018-11-21 DIAGNOSIS — M79675 Pain in left toe(s): Secondary | ICD-10-CM

## 2018-11-21 DIAGNOSIS — B351 Tinea unguium: Secondary | ICD-10-CM

## 2018-11-21 DIAGNOSIS — M79674 Pain in right toe(s): Secondary | ICD-10-CM

## 2018-11-21 NOTE — Progress Notes (Signed)
Complaint:  Visit Type: Patient returns to my office for continued preventative foot care services. Complaint: Patient states" my nails have grown long and thick and become painful to walk and wear shoes" . The patient presents for preventative foot care services. No changes to ROS  Podiatric Exam: Vascular: dorsalis pedis and posterior tibial pulses are palpable bilateral. Capillary return is immediate. Temperature gradient is WNL. Skin turgor WNL  Sensorium: Normal Semmes Weinstein monofilament test. Normal tactile sensation bilaterally. Nail Exam: Pt has thick disfigured discolored nails with subungual debris noted bilateral entire nail hallux through fifth toenails Ulcer Exam: There is no evidence of ulcer or pre-ulcerative changes or infection. Orthopedic Exam: Muscle tone and strength are WNL. No limitations in general ROM. No crepitus or effusions noted. Foot type and digits show no abnormalities. HAV with hammer toe  B/L. Skin: No Porokeratosis. No infection or ulcers  Diagnosis:  Onychomycosis, , Pain in right toe, pain in left toes  Treatment & Plan Procedures and Treatment: Consent by patient was obtained for treatment procedures.   Debridement of mycotic and hypertrophic toenails, 1 through 5 bilateral and clearing of subungual debris. No ulceration, no infection noted.  Return Visit-Office Procedure: Patient instructed to return to the office for a follow up visit 3 months for continued evaluation and treatment.    Gardiner Barefoot DPM

## 2018-11-23 DIAGNOSIS — J301 Allergic rhinitis due to pollen: Secondary | ICD-10-CM | POA: Diagnosis not present

## 2018-11-23 DIAGNOSIS — J3081 Allergic rhinitis due to animal (cat) (dog) hair and dander: Secondary | ICD-10-CM | POA: Diagnosis not present

## 2018-11-23 DIAGNOSIS — J3089 Other allergic rhinitis: Secondary | ICD-10-CM | POA: Diagnosis not present

## 2018-11-26 ENCOUNTER — Other Ambulatory Visit: Payer: Self-pay | Admitting: *Deleted

## 2018-11-26 DIAGNOSIS — I10 Essential (primary) hypertension: Secondary | ICD-10-CM

## 2018-11-26 MED ORDER — NIFEDIPINE ER OSMOTIC RELEASE 60 MG PO TB24: 60.0000 mg | ORAL_TABLET | Freq: Every day | ORAL | 3 refills | Status: DC

## 2018-12-07 DIAGNOSIS — J3081 Allergic rhinitis due to animal (cat) (dog) hair and dander: Secondary | ICD-10-CM | POA: Diagnosis not present

## 2018-12-07 DIAGNOSIS — J301 Allergic rhinitis due to pollen: Secondary | ICD-10-CM | POA: Diagnosis not present

## 2018-12-07 DIAGNOSIS — J3089 Other allergic rhinitis: Secondary | ICD-10-CM | POA: Diagnosis not present

## 2018-12-12 ENCOUNTER — Other Ambulatory Visit: Payer: Self-pay | Admitting: *Deleted

## 2018-12-12 MED ORDER — POTASSIUM CHLORIDE CRYS ER 20 MEQ PO TBCR: EXTENDED_RELEASE_TABLET | ORAL | 1 refills | Status: DC

## 2018-12-12 NOTE — Telephone Encounter (Signed)
[facility] 

## 2018-12-16 ENCOUNTER — Encounter: Payer: Self-pay | Admitting: Nurse Practitioner

## 2018-12-18 ENCOUNTER — Other Ambulatory Visit: Payer: Self-pay | Admitting: *Deleted

## 2018-12-18 NOTE — Telephone Encounter (Signed)
What do you suggest for a replacement. Please Advise.

## 2018-12-18 NOTE — Telephone Encounter (Signed)
Patient called and requested refill.  Pended and sent to [facility] for approval.

## 2018-12-19 NOTE — Telephone Encounter (Signed)
Patient notified and agreed.  

## 2018-12-19 NOTE — Addendum Note (Signed)
Addended by: Rafael Bihari A on: 12/19/2018 09:15 AM   Modules accepted: Orders

## 2018-12-19 NOTE — Telephone Encounter (Signed)
She is already on protonix would continue this medication.

## 2018-12-21 DIAGNOSIS — J301 Allergic rhinitis due to pollen: Secondary | ICD-10-CM | POA: Diagnosis not present

## 2018-12-21 DIAGNOSIS — J3089 Other allergic rhinitis: Secondary | ICD-10-CM | POA: Diagnosis not present

## 2018-12-21 DIAGNOSIS — J3081 Allergic rhinitis due to animal (cat) (dog) hair and dander: Secondary | ICD-10-CM | POA: Diagnosis not present

## 2018-12-24 DIAGNOSIS — R05 Cough: Secondary | ICD-10-CM | POA: Diagnosis not present

## 2018-12-24 DIAGNOSIS — T17908D Unspecified foreign body in respiratory tract, part unspecified causing other injury, subsequent encounter: Secondary | ICD-10-CM | POA: Diagnosis not present

## 2018-12-24 DIAGNOSIS — J301 Allergic rhinitis due to pollen: Secondary | ICD-10-CM | POA: Diagnosis not present

## 2018-12-24 DIAGNOSIS — J3089 Other allergic rhinitis: Secondary | ICD-10-CM | POA: Diagnosis not present

## 2018-12-24 DIAGNOSIS — J3081 Allergic rhinitis due to animal (cat) (dog) hair and dander: Secondary | ICD-10-CM | POA: Diagnosis not present

## 2018-12-25 ENCOUNTER — Other Ambulatory Visit: Payer: Self-pay | Admitting: Otolaryngology

## 2018-12-25 DIAGNOSIS — T17908D Unspecified foreign body in respiratory tract, part unspecified causing other injury, subsequent encounter: Secondary | ICD-10-CM

## 2018-12-28 DIAGNOSIS — J3081 Allergic rhinitis due to animal (cat) (dog) hair and dander: Secondary | ICD-10-CM | POA: Diagnosis not present

## 2018-12-28 DIAGNOSIS — J3089 Other allergic rhinitis: Secondary | ICD-10-CM | POA: Diagnosis not present

## 2018-12-28 DIAGNOSIS — J301 Allergic rhinitis due to pollen: Secondary | ICD-10-CM | POA: Diagnosis not present

## 2018-12-31 ENCOUNTER — Ambulatory Visit
Admission: RE | Admit: 2018-12-31 | Discharge: 2018-12-31 | Disposition: A | Discharge status: Home / Self Care | Payer: PPO | Source: Ambulatory Visit | Attending: Otolaryngology | Admitting: Otolaryngology

## 2018-12-31 DIAGNOSIS — J3081 Allergic rhinitis due to animal (cat) (dog) hair and dander: Secondary | ICD-10-CM | POA: Diagnosis not present

## 2018-12-31 DIAGNOSIS — T17908D Unspecified foreign body in respiratory tract, part unspecified causing other injury, subsequent encounter: Secondary | ICD-10-CM

## 2018-12-31 DIAGNOSIS — J3089 Other allergic rhinitis: Secondary | ICD-10-CM | POA: Diagnosis not present

## 2018-12-31 DIAGNOSIS — R05 Cough: Secondary | ICD-10-CM | POA: Diagnosis not present

## 2018-12-31 DIAGNOSIS — J301 Allergic rhinitis due to pollen: Secondary | ICD-10-CM | POA: Diagnosis not present

## 2019-01-04 ENCOUNTER — Other Ambulatory Visit (HOSPITAL_COMMUNITY): Payer: Self-pay

## 2019-01-04 DIAGNOSIS — R1310 Dysphagia, unspecified: Secondary | ICD-10-CM

## 2019-01-04 DIAGNOSIS — J301 Allergic rhinitis due to pollen: Secondary | ICD-10-CM | POA: Diagnosis not present

## 2019-01-04 DIAGNOSIS — J3081 Allergic rhinitis due to animal (cat) (dog) hair and dander: Secondary | ICD-10-CM | POA: Diagnosis not present

## 2019-01-04 DIAGNOSIS — J3089 Other allergic rhinitis: Secondary | ICD-10-CM | POA: Diagnosis not present

## 2019-01-09 ENCOUNTER — Other Ambulatory Visit: Payer: Self-pay

## 2019-01-09 ENCOUNTER — Ambulatory Visit (HOSPITAL_COMMUNITY)
Admission: RE | Admit: 2019-01-09 | Discharge: 2019-01-09 | Disposition: A | Discharge status: Home / Self Care | Payer: PPO | Source: Ambulatory Visit | Attending: Otolaryngology | Admitting: Otolaryngology

## 2019-01-09 DIAGNOSIS — R1310 Dysphagia, unspecified: Secondary | ICD-10-CM | POA: Insufficient documentation

## 2019-01-09 DIAGNOSIS — K224 Dyskinesia of esophagus: Secondary | ICD-10-CM | POA: Insufficient documentation

## 2019-01-09 DIAGNOSIS — K219 Gastro-esophageal reflux disease without esophagitis: Secondary | ICD-10-CM | POA: Insufficient documentation

## 2019-01-09 DIAGNOSIS — R1314 Dysphagia, pharyngoesophageal phase: Secondary | ICD-10-CM | POA: Insufficient documentation

## 2019-01-10 ENCOUNTER — Other Ambulatory Visit: Payer: Self-pay | Admitting: *Deleted

## 2019-01-10 DIAGNOSIS — R05 Cough: Secondary | ICD-10-CM

## 2019-01-10 DIAGNOSIS — R053 Chronic cough: Secondary | ICD-10-CM

## 2019-01-10 MED ORDER — HYDROCOD POLST-CPM POLST ER 10-8 MG/5ML PO SUER: 5.0000 mL | Freq: Two times a day (BID) | ORAL | 0 refills | Status: DC | PRN

## 2019-01-10 NOTE — Progress Notes (Signed)
Late Entry MBS from 5/20    01/09/19 1152  SLP Visit Information  SLP Received On 01/09/19  General Information  HPI 73 yr old seen for outpatient MBS with complaints of coughing equally with liquids and solids and frequently throughout the day without po's sometimes triggered by coughing. Vocal quality oftern hoarse especially at end of the day. Pt reports coughing with dual consistency foods (m&m's creating increased saliva, watermelon and juice while masticating). She takes meds for reflux.  PMH: wheeze, HTN, GERD, allergies, asthma. No hx pna.    Type of Study MBS-Modified Barium Swallow Study  Previous Swallow Assessment  (none)  Diet Prior to this Study Regular;Thin liquids  Respiratory Status Room air  History of Recent Intubation No  Behavior/Cognition Alert;Cooperative;Pleasant mood  Oral Cavity Assessment WFL  Oral Care Completed by SLP No  Oral Cavity - Dentition Adequate natural dentition  Vision Functional for self feeding  Self-Feeding Abilities Able to feed self  Patient Positioning Upright in chair  Baseline Vocal Quality Other (comment) (intermittently hoarse)  Volitional Cough Strong  Volitional Swallow Able to elicit  Anatomy [facility]  Pharyngeal Secretions Not observed secondary MBS  Oral Assessment (Complete on admission/transfer/change in patient condition)  Does patient have any of the following "high(er) risk" factors? None of the above  Does patient have any of the following "at risk" factors? None of the above  Patient is LOW RISK Follow universal precautions (see row information)  Oral Motor/Sensory Function  Overall Oral Motor/Sensory Function WFL  Oral Preparation/Oral Phase  Oral Phase WFL  Pharyngeal Phase  Pharyngeal Phase WFL  Cervical Esophageal Phase  Cervical Esophageal Phase Impaired (prominent cricopharyngeus muscle)  Clinical Impression  Clinical Impression Pt exhibited oral and pharyngeal swallow function that was within normal range. Mild  vallecular and pyriform sinsus residue x 1 that pt sensed and spontaneously cleared. Airway closure, protection and timing of swallow all within functional limits preventing penetration and aspiration. Adequate bolus transit through UES however cricopharyngeus muscle is prominent. Pt's esophagus scanned which is not diagnosed during MBS however no overt abnormalities seen. Suspect she has irritation of laryngeal/pharyngeal area with reflux and thin liquids or increased saliva produced with certain foods may trickle into pharynx prematurely triggering cough sensation. SLP educated pt and provided handout on reflux precautions as well as verbal education re: ways to supress cough. Recommend continue regular texture, thin liquids, pills one at a time with liquid or puree.      SLP Visit Diagnosis Dysphagia, unspecified (R13.10)  Impact on safety and function Mild aspiration risk  Swallow Evaluation Recommendations  SLP Diet Recommendations Regular solids;Thin liquid  Liquid Administration via Cup;Straw  Medication Administration Whole meds with liquid  Supervision Patient able to self feed  Compensations Slow rate;Small sips/bites  Postural Changes Seated upright at 90 degrees;Remain semi-upright after after feeds/meals (Comment)  Treatment Plan  Oral Care Recommendations Oral care BID  Treatment Recommendations No treatment recommended at this time  Follow up Recommendations None  Individuals Consulted  Consulted and Agree with Results and Recommendations Patient  Report Sent to  Referring physician  SLP Time Calculation  SLP Start Time (ACUTE ONLY) 1118  SLP Stop Time (ACUTE ONLY) 1141  SLP Time Calculation (min) (ACUTE ONLY) 23 min  SLP Evaluations  $ SLP Speech Visit 1 Visit  SLP Evaluations  $Outpatient MBS Swallow 1 Procedure    Orbie Pyo Juliya Magill M.Ed Risk analyst [phone] Office [phone]

## 2019-01-10 NOTE — Telephone Encounter (Signed)
Patient called requested refill [place] Verified LR: 10/25/2018 Pended Rx and sent to [place] for approval. Janett Billow out of office.

## 2019-01-11 DIAGNOSIS — J3081 Allergic rhinitis due to animal (cat) (dog) hair and dander: Secondary | ICD-10-CM | POA: Diagnosis not present

## 2019-01-11 DIAGNOSIS — J301 Allergic rhinitis due to pollen: Secondary | ICD-10-CM | POA: Diagnosis not present

## 2019-01-11 DIAGNOSIS — J3089 Other allergic rhinitis: Secondary | ICD-10-CM | POA: Diagnosis not present

## 2019-01-25 DIAGNOSIS — J3081 Allergic rhinitis due to animal (cat) (dog) hair and dander: Secondary | ICD-10-CM | POA: Diagnosis not present

## 2019-01-25 DIAGNOSIS — J3089 Other allergic rhinitis: Secondary | ICD-10-CM | POA: Diagnosis not present

## 2019-01-25 DIAGNOSIS — J301 Allergic rhinitis due to pollen: Secondary | ICD-10-CM | POA: Diagnosis not present

## 2019-01-29 ENCOUNTER — Other Ambulatory Visit: Payer: Self-pay | Admitting: *Deleted

## 2019-01-29 DIAGNOSIS — Z01419 Encounter for gynecological examination (general) (routine) without abnormal findings: Secondary | ICD-10-CM | POA: Diagnosis not present

## 2019-01-29 DIAGNOSIS — N816 Rectocele: Secondary | ICD-10-CM | POA: Diagnosis not present

## 2019-01-29 DIAGNOSIS — R159 Full incontinence of feces: Secondary | ICD-10-CM | POA: Diagnosis not present

## 2019-01-29 DIAGNOSIS — R32 Unspecified urinary incontinence: Secondary | ICD-10-CM | POA: Diagnosis not present

## 2019-01-29 MED ORDER — PANTOPRAZOLE SODIUM 40 MG PO TBEC: 40.0000 mg | DELAYED_RELEASE_TABLET | Freq: Two times a day (BID) | ORAL | 2 refills | Status: DC

## 2019-01-29 NOTE — Telephone Encounter (Signed)
[facility] 

## 2019-01-30 DIAGNOSIS — H04123 Dry eye syndrome of bilateral lacrimal glands: Secondary | ICD-10-CM | POA: Diagnosis not present

## 2019-01-30 DIAGNOSIS — H40023 Open angle with borderline findings, high risk, bilateral: Secondary | ICD-10-CM | POA: Diagnosis not present

## 2019-01-30 DIAGNOSIS — H1851 Endothelial corneal dystrophy: Secondary | ICD-10-CM | POA: Diagnosis not present

## 2019-02-05 ENCOUNTER — Other Ambulatory Visit: Payer: Self-pay | Admitting: *Deleted

## 2019-02-05 MED ORDER — FUROSEMIDE 40 MG PO TABS: ORAL_TABLET | ORAL | 1 refills | Status: DC

## 2019-02-05 NOTE — Telephone Encounter (Signed)
[facility] 

## 2019-02-08 DIAGNOSIS — J301 Allergic rhinitis due to pollen: Secondary | ICD-10-CM | POA: Diagnosis not present

## 2019-02-08 DIAGNOSIS — J3081 Allergic rhinitis due to animal (cat) (dog) hair and dander: Secondary | ICD-10-CM | POA: Diagnosis not present

## 2019-02-08 DIAGNOSIS — J3089 Other allergic rhinitis: Secondary | ICD-10-CM | POA: Diagnosis not present

## 2019-02-20 ENCOUNTER — Ambulatory Visit: Payer: PPO | Admitting: Podiatry

## 2019-02-20 ENCOUNTER — Encounter: Payer: Self-pay | Admitting: Podiatry

## 2019-02-20 ENCOUNTER — Other Ambulatory Visit: Payer: Self-pay

## 2019-02-20 DIAGNOSIS — M79674 Pain in right toe(s): Secondary | ICD-10-CM

## 2019-02-20 DIAGNOSIS — M79675 Pain in left toe(s): Secondary | ICD-10-CM | POA: Diagnosis not present

## 2019-02-20 DIAGNOSIS — B351 Tinea unguium: Secondary | ICD-10-CM

## 2019-02-20 NOTE — Progress Notes (Signed)
Complaint:  Visit Type: Patient returns to my office for continued preventative foot care services. Complaint: Patient states" my nails have grown long and thick and become painful to walk and wear shoes" . The patient presents for preventative foot care services. No changes to ROS  Podiatric Exam: Vascular: dorsalis pedis and posterior tibial pulses are palpable bilateral. Capillary return is immediate. Temperature gradient is WNL. Skin turgor WNL  Sensorium: Normal Semmes Weinstein monofilament test. Normal tactile sensation bilaterally. Nail Exam: Pt has thick disfigured discolored nails with subungual debris noted bilateral entire nail hallux through fifth toenails Ulcer Exam: There is no evidence of ulcer or pre-ulcerative changes or infection. Orthopedic Exam: Muscle tone and strength are WNL. No limitations in general ROM. No crepitus or effusions noted. Foot type and digits show no abnormalities. HAV with hammer toe  B/L. Skin: No Porokeratosis. No infection or ulcers  Diagnosis:  Onychomycosis, , Pain in right toe, pain in left toes  Treatment & Plan Procedures and Treatment: Consent by patient was obtained for treatment procedures.   Debridement of mycotic and hypertrophic toenails, 1 through 5 bilateral and clearing of subungual debris. No ulceration, no infection noted.  Return Visit-Office Procedure: Patient instructed to return to the office for a follow up visit 3 months for continued evaluation and treatment.    Gardiner Barefoot DPM

## 2019-02-21 DIAGNOSIS — J3081 Allergic rhinitis due to animal (cat) (dog) hair and dander: Secondary | ICD-10-CM | POA: Diagnosis not present

## 2019-02-21 DIAGNOSIS — J3089 Other allergic rhinitis: Secondary | ICD-10-CM | POA: Diagnosis not present

## 2019-02-21 DIAGNOSIS — J301 Allergic rhinitis due to pollen: Secondary | ICD-10-CM | POA: Diagnosis not present

## 2019-02-28 ENCOUNTER — Other Ambulatory Visit: Payer: Self-pay | Admitting: Obstetrics and Gynecology

## 2019-03-08 DIAGNOSIS — J301 Allergic rhinitis due to pollen: Secondary | ICD-10-CM | POA: Diagnosis not present

## 2019-03-08 DIAGNOSIS — J3089 Other allergic rhinitis: Secondary | ICD-10-CM | POA: Diagnosis not present

## 2019-03-08 DIAGNOSIS — J3081 Allergic rhinitis due to animal (cat) (dog) hair and dander: Secondary | ICD-10-CM | POA: Diagnosis not present

## 2019-03-14 ENCOUNTER — Encounter: Payer: Self-pay | Admitting: Nurse Practitioner

## 2019-03-14 DIAGNOSIS — R29890 Loss of height: Secondary | ICD-10-CM | POA: Diagnosis not present

## 2019-03-14 DIAGNOSIS — Z1231 Encounter for screening mammogram for malignant neoplasm of breast: Secondary | ICD-10-CM | POA: Diagnosis not present

## 2019-03-14 DIAGNOSIS — Z90710 Acquired absence of both cervix and uterus: Secondary | ICD-10-CM | POA: Diagnosis not present

## 2019-03-14 DIAGNOSIS — M8589 Other specified disorders of bone density and structure, multiple sites: Secondary | ICD-10-CM | POA: Diagnosis not present

## 2019-03-14 LAB — HM DEXA SCAN

## 2019-03-15 ENCOUNTER — Other Ambulatory Visit: Payer: PPO

## 2019-03-15 ENCOUNTER — Other Ambulatory Visit: Payer: Self-pay

## 2019-03-15 DIAGNOSIS — D649 Anemia, unspecified: Secondary | ICD-10-CM

## 2019-03-15 DIAGNOSIS — D509 Iron deficiency anemia, unspecified: Secondary | ICD-10-CM | POA: Diagnosis not present

## 2019-03-15 DIAGNOSIS — R748 Abnormal levels of other serum enzymes: Secondary | ICD-10-CM

## 2019-03-18 ENCOUNTER — Other Ambulatory Visit: Payer: Self-pay | Admitting: Nurse Practitioner

## 2019-03-18 DIAGNOSIS — R748 Abnormal levels of other serum enzymes: Secondary | ICD-10-CM

## 2019-03-20 ENCOUNTER — Encounter: Payer: Self-pay | Admitting: *Deleted

## 2019-03-20 ENCOUNTER — Ambulatory Visit (INDEPENDENT_AMBULATORY_CARE_PROVIDER_SITE_OTHER): Payer: PPO | Admitting: Nurse Practitioner

## 2019-03-20 ENCOUNTER — Other Ambulatory Visit: Payer: Self-pay

## 2019-03-20 ENCOUNTER — Encounter: Payer: Self-pay | Admitting: Nurse Practitioner

## 2019-03-20 VITALS — BP 142/84 | HR 97 | Temp 98.5°F | Ht 63.0 in | Wt 221.0 lb

## 2019-03-20 DIAGNOSIS — E785 Hyperlipidemia, unspecified: Secondary | ICD-10-CM | POA: Diagnosis not present

## 2019-03-20 DIAGNOSIS — I10 Essential (primary) hypertension: Secondary | ICD-10-CM

## 2019-03-20 DIAGNOSIS — F4322 Adjustment disorder with anxiety: Secondary | ICD-10-CM

## 2019-03-20 DIAGNOSIS — M8580 Other specified disorders of bone density and structure, unspecified site: Secondary | ICD-10-CM | POA: Diagnosis not present

## 2019-03-20 DIAGNOSIS — D509 Iron deficiency anemia, unspecified: Secondary | ICD-10-CM

## 2019-03-20 DIAGNOSIS — R05 Cough: Secondary | ICD-10-CM | POA: Diagnosis not present

## 2019-03-20 DIAGNOSIS — R748 Abnormal levels of other serum enzymes: Secondary | ICD-10-CM | POA: Diagnosis not present

## 2019-03-20 DIAGNOSIS — K219 Gastro-esophageal reflux disease without esophagitis: Secondary | ICD-10-CM

## 2019-03-20 DIAGNOSIS — R053 Chronic cough: Secondary | ICD-10-CM

## 2019-03-20 MED ORDER — HYDROCOD POLST-CPM POLST ER 10-8 MG/5ML PO SUER: 5.0000 mL | Freq: Two times a day (BID) | ORAL | 0 refills | Status: DC | PRN

## 2019-03-20 NOTE — Progress Notes (Signed)
Careteam: Patient Care Team: Lauree Chandler, NP as PCP - General (Geriatric Medicine) Monna Fam, MD as Consulting Physician (Ophthalmology)  Advanced Directive information Does Patient Have a Medical Advance Directive?: No(patient has paperwork from previous visit), Would patient like information on creating a medical advance directive?: Yes (MAU/Ambulatory/Procedural Areas - Information given)  Allergies  Allergen Reactions   Demerol Hives    All over the body    Lisinopril-Hydrochlorothiazide Hives    All over the body   Penicillins Hives    Has patient had a PCN reaction causing immediate rash, facial/tongue/throat swelling, SOB or lightheadedness with hypotension: No Has patient had a PCN reaction causing severe rash involving mucus membranes or skin necrosis: No Has patient had a PCN reaction that required hospitalization: No Has patient had a PCN reaction occurring within the last 10 years: No If all of the above answers are "NO", then may proceed with Cephalosporin use.   All over the body    Chief Complaint  Patient presents with   Medical Management of Chronic Issues    4 month follow-up    Quality Metric Gaps    Discuss need for colonoscopy    Best Practice Recommendations    Discuss need for Hep C screening    FYI    Patient will have bladder tact surgery in September 2020      HPI: Patient is a 73 y.o. female seen in the office today for routine follow up.   Had mammogram and now needs further imaging, going back in 2 days   Elevated alk phos- has been ongoing, thought this was coming from her bones due to osteopenia  Osteopenia- recently had follow up bone density, taking cal and vit d Should be walking but not due to heat  Anxiety and depression- mood has been good, now she is more anxious since she has to go back to have a repeat mammogram and now having to get ultrasound of liver due to elevated alk phos.   Chronic cough-  basically the same. Some days are better. Throat irriatates easily.  Pt has had allergies and cough for a long has she remembers. has been seen by allergies, pulmonary and ENT due to chronic cough. Has been given exercises and intervention. Now she is on tussionex which helps suppress cough but does not like taking it. Taking it every night which does help her but not resolved it. She has had to cut back on it due to insurance not paying for it.   Allergies/asthma- continues to use flonase daily and zyrtec and xyzal also using spiriva and symbicort and singulair. Chronic cough, getting allergy shot every 2 weeks.   htn- elevated today, losartan 75 mg daily, procardia, lasix 40 mg daily with potassium supplement  At home blood pressure 130-140/80  Anemia- continues on b12 supplement.   Hyperlipidemia- LDL 84 on pravastatin 40 mg daily. Heart healthy diet.   Review of Systems:  Review of Systems  Constitutional: Negative for chills, fever and weight loss.  HENT: Negative for tinnitus.   Respiratory: Positive for cough. Negative for sputum production and shortness of breath.   Cardiovascular: Negative for chest pain, palpitations and leg swelling.  Gastrointestinal: Negative for abdominal pain, constipation, diarrhea and heartburn.  Genitourinary: Negative for dysuria, frequency and urgency.  Musculoskeletal: Positive for joint pain. Negative for back pain, falls and myalgias.  Skin: Negative.   Neurological: Negative for dizziness and headaches.  Psychiatric/Behavioral: Negative for depression and memory loss. The  patient is nervous/anxious. The patient does not have insomnia.     Past Medical History:  Diagnosis Date   Abnormal Pap smear    Over 28yr ago   ALKALINE PHOSPHATASE, ELEVATED 07/01/2009   Qualifier: Diagnosis of  By: NLenna GilfordMD, SDeborra Medina   Allergic rhinitis    Anemia    Arthritis    Asthma    Cough    occasional   Cystocele 11/05/08   Diverticulosis of colon      DJD (degenerative joint disease)    no per pt   Elevated alkaline phosphatase level    GERD (gastroesophageal reflux disease)    H/O urinary incontinence 11/06/2008   H/O varicella    Hard measles    Hx of colonic polyps    Hypercholesterolemia    Hypertension    Leg swelling    occasional   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 AOctavio Manns  bladder tack  2011   BREAST SURGERY  1978   cyst removed from both removed   COLONOSCOPY     ESOPHAGOGASTRODUODENOSCOPY     Social History:   reports that she has never smoked. She has never used smokeless tobacco. She reports current alcohol use of about 2.0 standard drinks of alcohol per week. She reports that she does not use drugs.  Family History  Problem Relation Age of Onset   Aneurysm Mother    Lung cancer Father    Hypertension Daughter    Hypertension Daughter    Colon cancer Neg Hx    Esophageal cancer Neg Hx    Stomach cancer Neg Hx    Rectal cancer Neg Hx    Colon polyps Neg Hx     Medications: Patient's Medications  New Prescriptions   No medications on file  Previous Medications   ALBUTEROL (PROVENTIL HFA;VENTOLIN HFA) 108 (90 BASE) MCG/ACT INHALER    Inhale 2 puffs into the lungs every 6 (six) hours as needed for wheezing or shortness of breath.   BUDESONIDE-FORMOTEROL (SYMBICORT) 160-4.5 MCG/ACT INHALER    Inhale 2 puffs into the lungs 2 (two) times daily.   CALCIUM CARB-CHOLECALCIFEROL (CALCIUM 600 + D PO)    Take 1 tablet by mouth 2 (two) times daily.    CETIRIZINE (ZYRTEC) 10 MG TABLET    Take 10 mg by mouth daily.    CHLORPHENIRAMINE-HYDROCODONE (TUSSIONEX PENNKINETIC ER) 10-8 MG/5ML SUER    Take 5 mLs by mouth every 12 (twelve) hours as needed for cough.   DULOXETINE (CYMBALTA) 60 MG CAPSULE    Take 1 capsule (60 mg total) by mouth daily.   EPINEPHRINE 0.3 MG/0.3 ML IJ SOAJ INJECTION    epinephrine 0.3 mg/0.3 mL injection, auto-injector   FLUTICASONE (FLONASE) 50 MCG/ACT NASAL SPRAY    2 sprays daily.   FUROSEMIDE (LASIX) 40 MG TABLET    TAKE 1 TABLET BY MOUTH EVERY DAY   LEVOCETIRIZINE (XYZAL) 5 MG TABLET    Take 1 tablet (5 mg total) by mouth every evening.   LOSARTAN (COZAAR) 25 MG TABLET    TAKE  1 TABLET (25 MG TOTAL) BY MOUTH DAILY. TAKE WITH 50 MG TABLET FOR A TOTAL OF 75 MG DAILY.   MONTELUKAST (SINGULAIR) 10 MG TABLET    Take 10 mg by mouth at bedtime.     MULTIPLE VITAMINS-MINERALS (MACULAR VITAMIN BENEFIT PO)    Take 1 capsule by mouth 2 (two) times daily.     NEOMYCIN-POLYMYXIN B-DEXAMETHASONE (MAXITROL) 3.5-10000-0.1 OINT    Place 3.5 application into the left eye as needed.   NIFEDIPINE (PROCARDIA XL/NIFEDICAL XL) 60 MG 24 HR TABLET    Take 1 tablet (60 mg total) by mouth daily.   NON FORMULARY    Allergy shots once every other week   OMEGA-3 FATTY ACIDS (FISH OIL PO)    Take by mouth daily.   PANTOPRAZOLE (PROTONIX) 40 MG TABLET    Take 1 tablet (40 mg total) by mouth 2 (two) times daily. For stomach   POTASSIUM CHLORIDE SA (KLOR-CON M20) 20 MEQ TABLET    Take two tablets by mouth once daily for potassium supplement   PRAVASTATIN (PRAVACHOL) 40 MG TABLET    TAKE 1 TABLET BY MOUTH EVERY DAY   PSYLLIUM (METAMUCIL) 58.6 % PACKET    Take 1 packet by mouth daily as needed (constipation).   SPIRIVA HANDIHALER 18 MCG INHALATION CAPSULE    18 mcg 2 (two) times daily.   VITAMIN B-12 (CYANOCOBALAMIN) 1000 MCG TABLET    Take 1,000 mcg by mouth daily.  Modified Medications   No medications on file  Discontinued Medications   NITROFURANTOIN, MACROCRYSTAL-MONOHYDRATE, (MACROBID) 100 MG CAPSULE     nitrofurantoin monohydrate/macrocrystals 100 mg capsule  TAKE 1 CAPSULE BY MOUTH EVERY 12 HOURS FOR 5 DAYS    Physical Exam:  Vitals:   03/20/19 0955  BP: (!) 142/84  Pulse: 97  Temp: 98.5 F (36.9 C)  TempSrc: Oral  SpO2: 98%  Weight: 221 lb (100.2 kg)  Height: '5\' 3"'$  (1.6 m)   Body mass index is 39.15 kg/m. Wt Readings from Last 3 Encounters:  03/20/19 221 lb (100.2 kg)  07/17/18 219 lb (99.3 kg)  06/08/18 217 lb (98.4 kg)    Physical Exam Constitutional:      General: She is not in acute distress.    Appearance: She is well-developed. She is not diaphoretic.  HENT:     Head: Normocephalic and atraumatic.     Mouth/Throat:     Pharynx: No oropharyngeal exudate.  Eyes:     Conjunctiva/sclera: Conjunctivae normal.     Pupils: Pupils are equal, round, and reactive to light.  Neck:     Musculoskeletal: Normal range of motion and neck supple.  Cardiovascular:     Rate and Rhythm: Normal rate and regular rhythm.     Heart sounds: Normal heart sounds.  Pulmonary:     Effort: Pulmonary effort is normal.     Breath sounds: Normal breath sounds.  Abdominal:     General: Bowel sounds are normal.     Palpations: Abdomen is soft.  Musculoskeletal:        General: No tenderness.  Skin:    General: Skin is warm and dry.  Neurological:     Mental Status: She is alert and oriented to person, place, and time.     Labs reviewed: Basic Metabolic Panel: Recent Labs    04/30/18 0839 11/09/18 0813 03/15/19 0840  NA 142 141 140  K 4.0 4.0 3.8  CL 107 107 108  CO2 '27 24 27  '$ GLUCOSE 85 100* 107*  BUN '13 11 13  '$ CREATININE 0.94* 0.87 0.86  CALCIUM 9.6 9.4 9.5   Liver Function Tests: Recent Labs    04/30/18 0839 11/09/18 0813 03/15/19 0840  AST '23 24 18  '$ ALT '13 16 13  '$ BILITOT 0.4 0.5 0.5  PROT 7.0 7.5 7.1   No results for input(s): LIPASE, AMYLASE in the last 8760 hours. No results for input(s): AMMONIA in the last 8760 hours. CBC: Recent Labs     11/09/18 0813 03/15/19 0840  WBC 6.0 5.8  NEUTROABS 2,298 2,714  HGB 11.5* 11.4*  HCT 34.4* 34.9*  MCV 81.1 81.2  PLT 279 264   Lipid Panel: Recent Labs    04/30/18 0839 11/09/18 0813  CHOL 160 155  HDL 52 56  LDLCALC 89 84  TRIG 99 70  CHOLHDL 3.1 2.8   TSH: No results for input(s): TSH in the last 8760 hours. A1C: Lab Results  Component Value Date   HGBA1C 5.6 05/26/2014     Assessment/Plan 1. Adjustment disorder with anxious mood Stable, continues on cymbalta  2. Elevated alkaline phosphatase level -ongoing, isoenzymes reveal elevated Liver Isoenzymes, ultrasound of liver order for further evaluation  3. Hyperlipidemia LDL goal <130 Stable on Pravachol. Continue with dietary modifications  4. Essential hypertension Elevated today but reports home blood pressure readings better controlled. Will continue current regimen. Dash diet given. Will follow up in 2 weeks with home readings.  5. Gastroesophageal reflux disease without esophagitis Controlled on protonix 40 mg daily with dietary modifications.   6. Chronic cough -stable, requires tussionex when cough flares.  - chlorpheniramine-HYDROcodone (TUSSIONEX PENNKINETIC ER) 10-8 MG/5ML SUER; Take 5 mLs by mouth every 12 (twelve) hours as needed for cough.  Dispense: 300 mL; Refill: 0  7. Osteopenia, unspecified location Had recent dexa scan. Continues on cal and vit d, encouraged weight bearing activity as toelrates  8. Anemia, unspecified type -stable however due to microcytic anemia will add on iron studies for further evaluation. No signs of blood loss noted  9. Chronic cough - chlorpheniramine-HYDROcodone (TUSSIONEX PENNKINETIC ER) 10-8 MG/5ML SUER; Take 5 mLs by mouth every 12 (twelve) hours as needed for cough.  Dispense: 300 mL; Refill: 0   Next appt: 2 week for blood pressure Mahmood Boehringer K. [place], [facility] [phone]

## 2019-03-20 NOTE — Patient Instructions (Addendum)
To take blood pressure after you have taken your medication  Make sure you have been sitting for 5 mins  Record and let us know after 2 weeks. May need to adjust medication   Follow up in 2 weeks for blood pressure (tele-visit okay)  DASH Eating Plan DASH stands for "Dietary Approaches to Stop Hypertension." The DASH eating plan is a healthy eating plan that has been shown to reduce high blood pressure (hypertension). It may also reduce your risk for type 2 diabetes, heart disease, and stroke. The DASH eating plan may also help with weight loss. What are tips for following this plan?  General guidelines  Avoid eating more than 2,300 mg (milligrams) of salt (sodium) a day. If you have hypertension, you may need to reduce your sodium intake to 1,500 mg a day.  Limit alcohol intake to no more than 1 drink a day for nonpregnant women and 2 drinks a day for men. One drink equals 12 oz of beer, 5 oz of wine, or 1 oz of hard liquor.  Work with your health care provider to maintain a healthy body weight or to lose weight. Ask what an ideal weight is for you.  Get at least 30 minutes of exercise that causes your heart to beat faster (aerobic exercise) most days of the week. Activities may include walking, swimming, or biking.  Work with your health care provider or diet and nutrition specialist (dietitian) to adjust your eating plan to your individual calorie needs. Reading food labels   Check food labels for the amount of sodium per serving. Choose foods with less than 5 percent of the Daily Value of sodium. Generally, foods with less than 300 mg of sodium per serving fit into this eating plan.  To find whole grains, look for the word "whole" as the first word in the ingredient list. Shopping  Buy products labeled as "low-sodium" or "no salt added."  Buy fresh foods. Avoid canned foods and premade or frozen meals. Cooking  Avoid adding salt when cooking. Use salt-free seasonings or herbs  instead of table salt or sea salt. Check with your health care provider or pharmacist before using salt substitutes.  Do not fry foods. Cook foods using healthy methods such as baking, boiling, grilling, and broiling instead.  Cook with heart-healthy oils, such as olive, canola, soybean, or sunflower oil. Meal planning  Eat a balanced diet that includes: ? 5 or more servings of fruits and vegetables each day. At each meal, try to fill half of your plate with fruits and vegetables. ? Up to 6-8 servings of whole grains each day. ? Less than 6 oz of lean meat, poultry, or fish each day. A 3-oz serving of meat is about the same size as a deck of cards. One egg equals 1 oz. ? 2 servings of low-fat dairy each day. ? A serving of nuts, seeds, or beans 5 times each week. ? Heart-healthy fats. Healthy fats called Omega-3 fatty acids are found in foods such as flaxseeds and coldwater fish, like sardines, salmon, and mackerel.  Limit how much you eat of the following: ? Canned or prepackaged foods. ? Food that is high in trans fat, such as fried foods. ? Food that is high in saturated fat, such as fatty meat. ? Sweets, desserts, sugary drinks, and other foods with added sugar. ? Full-fat dairy products.  Do not salt foods before eating.  Try to eat at least 2 vegetarian meals each week.  Eat more  home-cooked food and less restaurant, buffet, and fast food.  When eating at a restaurant, ask that your food be prepared with less salt or no salt, if possible. What foods are recommended? The items listed may not be a complete list. Talk with your dietitian about what dietary choices are best for you. Grains Whole-grain or whole-wheat bread. Whole-grain or whole-wheat pasta. Brown rice. [place]. Bulgur. Whole-grain and low-sodium cereals. Pita bread. Low-fat, low-sodium crackers. Whole-wheat flour tortillas. Vegetables Fresh or frozen vegetables (raw, steamed, roasted, or grilled).  Low-sodium or reduced-sodium tomato and vegetable juice. Low-sodium or reduced-sodium tomato sauce and tomato paste. Low-sodium or reduced-sodium canned vegetables. Fruits All fresh, dried, or frozen fruit. Canned fruit in natural juice (without added sugar). Meat and other protein foods Skinless chicken or [place]. Ground chicken or [place]. Pork with fat trimmed off. Fish and seafood. Egg whites. Dried beans, peas, or lentils. Unsalted nuts, nut butters, and seeds. Unsalted canned beans. Lean cuts of beef with fat trimmed off. Low-sodium, lean deli meat. Dairy Low-fat (1%) or fat-free (skim) milk. Fat-free, low-fat, or reduced-fat cheeses. Nonfat, low-sodium ricotta or cottage cheese. Low-fat or nonfat yogurt. Low-fat, low-sodium cheese. Fats and oils Soft margarine without trans fats. Vegetable oil. Low-fat, reduced-fat, or light mayonnaise and salad dressings (reduced-sodium). Canola, safflower, olive, soybean, and sunflower oils. Avocado. Seasoning and other foods Herbs. Spices. Seasoning mixes without salt. Unsalted popcorn and pretzels. Fat-free sweets. What foods are not recommended? The items listed may not be a complete list. Talk with your dietitian about what dietary choices are best for you. Grains Baked goods made with fat, such as croissants, muffins, or some breads. Dry pasta or rice meal packs. Vegetables Creamed or fried vegetables. Vegetables in a cheese sauce. Regular canned vegetables (not low-sodium or reduced-sodium). Regular canned tomato sauce and paste (not low-sodium or reduced-sodium). Regular tomato and vegetable juice (not low-sodium or reduced-sodium). Angie Fava. Olives. Fruits Canned fruit in a light or heavy syrup. Fried fruit. Fruit in cream or butter sauce. Meat and other protein foods Fatty cuts of meat. Ribs. Fried meat. Berniece Salines. Sausage. Bologna and other processed lunch meats. Salami. Fatback. Hotdogs. Bratwurst. Salted nuts and seeds. Canned beans with added  salt. Canned or smoked fish. Whole eggs or egg yolks. Chicken or [place] with skin. Dairy Whole or 2% milk, cream, and half-and-half. Whole or full-fat cream cheese. Whole-fat or sweetened yogurt. Full-fat cheese. Nondairy creamers. Whipped toppings. Processed cheese and cheese spreads. Fats and oils Butter. Stick margarine. Lard. Shortening. Ghee. Bacon fat. Tropical oils, such as coconut, palm kernel, or palm oil. Seasoning and other foods Salted popcorn and pretzels. Onion salt, garlic salt, seasoned salt, table salt, and sea salt. Worcestershire sauce. Tartar sauce. Barbecue sauce. Teriyaki sauce. Soy sauce, including reduced-sodium. Steak sauce. Canned and packaged gravies. Fish sauce. Oyster sauce. Cocktail sauce. Horseradish that you find on the shelf. Ketchup. Mustard. Meat flavorings and tenderizers. Bouillon cubes. Hot sauce and Tabasco sauce. Premade or packaged marinades. Premade or packaged taco seasonings. Relishes. Regular salad dressings. Where to find more information:  National Heart, Lung, and [place]: https://wilson-eaton.com/  American Heart Association: www.heart.org Summary  The DASH eating plan is a healthy eating plan that has been shown to reduce high blood pressure (hypertension). It may also reduce your risk for type 2 diabetes, heart disease, and stroke.  With the DASH eating plan, you should limit salt (sodium) intake to 2,300 mg a day. If you have hypertension, you may need to reduce your sodium intake to 1,500  mg a day.  When on the DASH eating plan, aim to eat more fresh fruits and vegetables, whole grains, lean proteins, low-fat dairy, and heart-healthy fats.  Work with your health care provider or diet and nutrition specialist (dietitian) to adjust your eating plan to your individual calorie needs. This information is not intended to replace advice given to you by your health care provider. Make sure you discuss any questions you have with your health care  provider. Document Released: 07/28/2011 Document Revised: 07/21/2017 Document Reviewed: 08/01/2016 Elsevier Patient Education  2020 [employer].

## 2019-03-21 ENCOUNTER — Telehealth: Payer: Self-pay

## 2019-03-21 LAB — CBC WITH DIFFERENTIAL/PLATELET
Absolute Monocytes: 476 cells/uL (ref 200–950)
Basophils Absolute: 41 cells/uL (ref 0–200)
Basophils Relative: 0.7 %
Eosinophils Absolute: 197 cells/uL (ref 15–500)
Eosinophils Relative: 3.4 %
HCT: 34.9 % — ABNORMAL LOW (ref 35.0–45.0)
Hemoglobin: 11.4 g/dL — ABNORMAL LOW (ref 11.7–15.5)
Lymphs Abs: 2372 cells/uL (ref 850–3900)
MCH: 26.5 pg — ABNORMAL LOW (ref 27.0–33.0)
MCHC: 32.7 g/dL (ref 32.0–36.0)
MCV: 81.2 fL (ref 80.0–100.0)
MPV: 11.2 fL (ref 7.5–12.5)
Monocytes Relative: 8.2 %
Neutro Abs: 2714 cells/uL (ref 1500–7800)
Neutrophils Relative %: 46.8 %
Platelets: 264 10*3/uL (ref 140–400)
RBC: 4.3 10*6/uL (ref 3.80–5.10)
RDW: 13.9 % (ref 11.0–15.0)
Total Lymphocyte: 40.9 %
WBC: 5.8 10*3/uL (ref 3.8–10.8)

## 2019-03-21 LAB — TEST AUTHORIZATION

## 2019-03-21 LAB — IRON,TIBC AND FERRITIN PANEL
%SAT: 25 % (calc) (ref 16–45)
Ferritin: 65 ng/mL (ref 16–288)
Iron: 75 ug/dL (ref 45–160)
TIBC: 305 mcg/dL (calc) (ref 250–450)

## 2019-03-21 LAB — COMPLETE METABOLIC PANEL WITH GFR
AG Ratio: 1.3 (calc) (ref 1.0–2.5)
ALT: 13 U/L (ref 6–29)
AST: 18 U/L (ref 10–35)
Albumin: 4 g/dL (ref 3.6–5.1)
Alkaline phosphatase (APISO): 162 U/L — ABNORMAL HIGH (ref 37–153)
BUN: 13 mg/dL (ref 7–25)
CO2: 27 mmol/L (ref 20–32)
Calcium: 9.5 mg/dL (ref 8.6–10.4)
Chloride: 108 mmol/L (ref 98–110)
Creat: 0.86 mg/dL (ref 0.60–0.93)
GFR, Est African American: 77 mL/min/{1.73_m2} (ref >=60)
GFR, Est Non African American: 66 mL/min/{1.73_m2} (ref >=60)
Globulin: 3.1 g/dL (calc) (ref 1.9–3.7)
Glucose, Bld: 107 mg/dL — ABNORMAL HIGH (ref 65–99)
Potassium: 3.8 mmol/L (ref 3.5–5.3)
Sodium: 140 mmol/L (ref 135–146)
Total Bilirubin: 0.5 mg/dL (ref 0.2–1.2)
Total Protein: 7.1 g/dL (ref 6.1–8.1)

## 2019-03-21 LAB — ALKALINE PHOSPHATASE ISOENZYMES
Alkaline phosphatase (APISO): 162 U/L — ABNORMAL HIGH (ref 37–153)
Bone Isoenzymes: 23 % — ABNORMAL LOW (ref 28–66)
Intestinal Isoenzymes: 0 % — ABNORMAL LOW (ref 1–24)
Liver Isoenzymes: 77 % — ABNORMAL HIGH (ref 25–69)

## 2019-03-21 NOTE — Telephone Encounter (Signed)
Left message on voicemail for patient to return call when available  Reason for call: Patient with osteopenia, continue calcium and vitamin D with weight bearing activity. T-score -1.6  Report sent to scanning

## 2019-03-22 ENCOUNTER — Encounter: Payer: Self-pay | Admitting: Nurse Practitioner

## 2019-03-22 DIAGNOSIS — J301 Allergic rhinitis due to pollen: Secondary | ICD-10-CM | POA: Diagnosis not present

## 2019-03-22 DIAGNOSIS — R928 Other abnormal and inconclusive findings on diagnostic imaging of breast: Secondary | ICD-10-CM | POA: Diagnosis not present

## 2019-03-22 DIAGNOSIS — J3081 Allergic rhinitis due to animal (cat) (dog) hair and dander: Secondary | ICD-10-CM | POA: Diagnosis not present

## 2019-03-22 DIAGNOSIS — R922 Inconclusive mammogram: Secondary | ICD-10-CM | POA: Diagnosis not present

## 2019-03-22 DIAGNOSIS — J3089 Other allergic rhinitis: Secondary | ICD-10-CM | POA: Diagnosis not present

## 2019-03-22 DIAGNOSIS — N6489 Other specified disorders of breast: Secondary | ICD-10-CM | POA: Diagnosis not present

## 2019-03-22 LAB — HM MAMMOGRAPHY

## 2019-03-26 ENCOUNTER — Encounter: Payer: Self-pay | Admitting: *Deleted

## 2019-03-26 NOTE — Telephone Encounter (Signed)
Left message on voicemail for patient to return call when available   

## 2019-03-27 ENCOUNTER — Other Ambulatory Visit: Payer: Self-pay | Admitting: Nurse Practitioner

## 2019-03-27 ENCOUNTER — Telehealth: Payer: Self-pay

## 2019-03-27 ENCOUNTER — Other Ambulatory Visit: Payer: Self-pay

## 2019-03-27 ENCOUNTER — Ambulatory Visit
Admission: RE | Admit: 2019-03-27 | Discharge: 2019-03-27 | Disposition: A | Discharge status: Home / Self Care | Payer: PPO | Source: Ambulatory Visit | Attending: Nurse Practitioner | Admitting: Nurse Practitioner

## 2019-03-27 DIAGNOSIS — K838 Other specified diseases of biliary tract: Secondary | ICD-10-CM | POA: Diagnosis not present

## 2019-03-27 DIAGNOSIS — K8020 Calculus of gallbladder without cholecystitis without obstruction: Secondary | ICD-10-CM

## 2019-03-27 DIAGNOSIS — R748 Abnormal levels of other serum enzymes: Secondary | ICD-10-CM

## 2019-03-27 NOTE — Telephone Encounter (Signed)
Janie from [place] at [facility] called about results on right upper quadrant ultrasound. Narda Rutherford stated site is suspicious for Choledocholithiasis and wanted to make provider aware. There are also several stones in gallbladder causing pain to patient. Doctor wanted to make provider aware.

## 2019-03-27 NOTE — Telephone Encounter (Signed)
Left message with female for patient to return call when available

## 2019-03-27 NOTE — Telephone Encounter (Signed)
Discussed results with patient, patient verbalized understanding of results  

## 2019-03-27 NOTE — Addendum Note (Signed)
Addended by: Logan Bores on: 03/27/2019 04:29 PM   Modules accepted: Orders

## 2019-04-03 ENCOUNTER — Ambulatory Visit (INDEPENDENT_AMBULATORY_CARE_PROVIDER_SITE_OTHER): Payer: PPO | Admitting: Nurse Practitioner

## 2019-04-03 ENCOUNTER — Other Ambulatory Visit: Payer: Self-pay

## 2019-04-03 ENCOUNTER — Encounter: Payer: Self-pay | Admitting: Nurse Practitioner

## 2019-04-03 DIAGNOSIS — K8020 Calculus of gallbladder without cholecystitis without obstruction: Secondary | ICD-10-CM

## 2019-04-03 DIAGNOSIS — I10 Essential (primary) hypertension: Secondary | ICD-10-CM

## 2019-04-03 NOTE — Progress Notes (Signed)
This service is provided via telemedicine  No vital signs collected/recorded due to the encounter was a telemedicine visit.   Location of patient (ex: home, work): Home   Patient consents to a telephone visit:  Yes  Location of the provider (ex: office, home):  [facility], Office   Name of any referring provider:  N/A  Names of all persons participating in the telemedicine service and their role in the encounter:  S.Chrae B/CMA, Sherrie Mustache, NP, and Patient   Time spent on call:  6 min with medical assistant      Careteam: Patient Care Team: Lauree Chandler, NP as PCP - General (Geriatric Medicine) Monna Fam, MD as Consulting Physician (Ophthalmology)  Advanced Directive information    Allergies  Allergen Reactions  . Demerol Hives    All over the body   . Lisinopril-Hydrochlorothiazide Hives    All over the body  . Penicillins Hives    Has patient had a PCN reaction causing immediate rash, facial/tongue/throat swelling, SOB or lightheadedness with hypotension: No Has patient had a PCN reaction causing severe rash involving mucus membranes or skin necrosis: No Has patient had a PCN reaction that required hospitalization: No Has patient had a PCN reaction occurring within the last 10 years: No If all of the above answers are "NO", then may proceed with Cephalosporin use.   All over the body    Chief Complaint  Patient presents with  . Follow-up    2 week follow-up on blood pressure. Telephone visit      HPI: Patient is a 73 y.o. female for follow up blood pressure.  Home readings as follows 141/83, 131/97, 126/78, 131/97, 129/86, 125/90, 126/80, 124/70 She continues on losartan 78 mg daily, nifedipine 60 mg daily and lasix with potassium.    Recent abdominal US revealed gallstones, she has not had any pain in that area. States it was hard to do the exam due to having to hold her breath but was without pain.   Review of Systems:   Review of Systems  Constitutional: Negative for chills, fever and weight loss.  HENT: Negative for tinnitus.   Respiratory: Positive for cough. Negative for sputum production and shortness of breath.   Cardiovascular: Negative for chest pain, palpitations and leg swelling.  Gastrointestinal: Negative for abdominal pain, constipation, diarrhea, heartburn, nausea and vomiting.  Genitourinary: Negative for dysuria, frequency and urgency.  Skin: Negative.   Neurological: Negative for dizziness and headaches.    Past Medical History:  Diagnosis Date  . Abnormal Pap smear    Over 6yr ago  . ALKALINE PHOSPHATASE, ELEVATED 07/01/2009   Qualifier: Diagnosis of  By: NLenna GilfordMD, SDeborra Medina  . Allergic rhinitis   . Anemia   . Arthritis   . Asthma   . Cough    occasional  . Cystocele 11/05/08  . Diverticulosis of colon   . DJD (degenerative joint disease)    no per pt  . Elevated alkaline phosphatase level   . GERD (gastroesophageal reflux disease)   . H/O urinary incontinence 11/06/2008  . H/O varicella   . Hard measles   . Hx of colonic polyps   . Hypercholesterolemia   . Hypertension   . Leg swelling    occasional  . 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 Overweight(278.02)   . Rectocele 11/06/2008   Large  . Sebaceous cyst    on back  . Sinusitis   . Venous insufficiency   .  Wheezing    occasional  . Yeast infection    Past Surgical History:  Procedure Laterality Date  . ABDOMINAL HYSTERECTOMY  1988  . ANTERIOR AND POSTERIOR REPAIR  11/05/08  . APOGEE / PERIGEE REPAIR  10/2008   Dr. Octavio Manns  . bladder tack  2011  . BREAST SURGERY  1978   cyst removed from both removed  . COLONOSCOPY    . ESOPHAGOGASTRODUODENOSCOPY     Social History:   reports that she has never smoked. She has never used smokeless tobacco. She reports current alcohol use of about 2.0 standard drinks of alcohol per week. She reports that she does not use drugs.  Family History   Problem Relation Age of Onset  . Aneurysm Mother   . Lung cancer Father   . Hypertension Daughter   . Hypertension Daughter   . Colon cancer Neg Hx   . Esophageal cancer Neg Hx   . Stomach cancer Neg Hx   . Rectal cancer Neg Hx   . Colon polyps Neg Hx     Medications: Patient's Medications  New Prescriptions   No medications on file  Previous Medications   ALBUTEROL (PROVENTIL HFA;VENTOLIN HFA) 108 (90 BASE) MCG/ACT INHALER    Inhale 2 puffs into the lungs every 6 (six) hours as needed for wheezing or shortness of breath.   BUDESONIDE-FORMOTEROL (SYMBICORT) 160-4.5 MCG/ACT INHALER    Inhale 2 puffs into the lungs 2 (two) times daily.   CALCIUM CARB-CHOLECALCIFEROL (CALCIUM 600 + D PO)    Take 1 tablet by mouth 2 (two) times daily.    CETIRIZINE (ZYRTEC) 10 MG TABLET    Take 10 mg by mouth daily.   CHLORPHENIRAMINE-HYDROCODONE (TUSSIONEX PENNKINETIC ER) 10-8 MG/5ML SUER    Take 5 mLs by mouth every 12 (twelve) hours as needed for cough.   DULOXETINE (CYMBALTA) 60 MG CAPSULE    Take 1 capsule (60 mg total) by mouth daily.   EPINEPHRINE 0.3 MG/0.3 ML IJ SOAJ INJECTION    epinephrine 0.3 mg/0.3 mL injection, auto-injector   FLUTICASONE (FLONASE) 50 MCG/ACT NASAL SPRAY    2 sprays daily.   FUROSEMIDE (LASIX) 40 MG TABLET    TAKE 1 TABLET BY MOUTH EVERY DAY   LEVOCETIRIZINE (XYZAL) 5 MG TABLET    Take 1 tablet (5 mg total) by mouth every evening.   LOSARTAN (COZAAR) 25 MG TABLET    Take 75 mg by mouth daily.   MONTELUKAST (SINGULAIR) 10 MG TABLET    Take 10 mg by mouth at bedtime.     MULTIPLE VITAMINS-MINERALS (MACULAR VITAMIN BENEFIT PO)    Take 1 capsule by mouth 2 (two) times daily.     NEOMYCIN-POLYMYXIN B-DEXAMETHASONE (MAXITROL) 3.5-10000-0.1 OINT    Place 3.5 application into the left eye as needed.   NIFEDIPINE (PROCARDIA XL/NIFEDICAL XL) 60 MG 24 HR TABLET    Take 1 tablet (60 mg total) by mouth daily.   NON FORMULARY    Allergy shots once every other week   OMEGA-3 FATTY ACIDS  (FISH OIL PO)    Take by mouth daily.   PANTOPRAZOLE (PROTONIX) 40 MG TABLET    Take 1 tablet (40 mg total) by mouth 2 (two) times daily. For stomach   POTASSIUM CHLORIDE SA (KLOR-CON M20) 20 MEQ TABLET    Take two tablets by mouth once daily for potassium supplement   PRAVASTATIN (PRAVACHOL) 40 MG TABLET    TAKE 1 TABLET BY MOUTH EVERY DAY   PSYLLIUM (METAMUCIL) 58.6 %  PACKET    Take 1 packet by mouth daily as needed (constipation).   SPIRIVA HANDIHALER 18 MCG INHALATION CAPSULE    18 mcg 2 (two) times daily.   VITAMIN B-12 (CYANOCOBALAMIN) 1000 MCG TABLET    Take 1,000 mcg by mouth daily.  Modified Medications   No medications on file  Discontinued Medications   LOSARTAN (COZAAR) 25 MG TABLET    TAKE 1 TABLET (25 MG TOTAL) BY MOUTH DAILY. TAKE WITH 50 MG TABLET FOR A TOTAL OF 75 MG DAILY.    Physical Exam:  There were no vitals filed for this visit. There is no height or weight on file to calculate BMI. Wt Readings from Last 3 Encounters:  03/20/19 221 lb (100.2 kg)  07/17/18 219 lb (99.3 kg)  06/08/18 217 lb (98.4 kg)    Labs reviewed: Basic Metabolic Panel: Recent Labs    04/30/18 0839 11/09/18 0813 03/15/19 0840  NA 142 141 140  K 4.0 4.0 3.8  CL 107 107 108  CO2 '27 24 27  '$ GLUCOSE 85 100* 107*  BUN '13 11 13  '$ CREATININE 0.94* 0.87 0.86  CALCIUM 9.6 9.4 9.5   Liver Function Tests: Recent Labs    04/30/18 0839 11/09/18 0813 03/15/19 0840  AST '23 24 18  '$ ALT '13 16 13  '$ BILITOT 0.4 0.5 0.5  PROT 7.0 7.5 7.1   No results for input(s): LIPASE, AMYLASE in the last 8760 hours. No results for input(s): AMMONIA in the last 8760 hours. CBC: Recent Labs    11/09/18 0813 03/15/19 0840  WBC 6.0 5.8  NEUTROABS 2,298 2,714  HGB 11.5* 11.4*  HCT 34.4* 34.9*  MCV 81.1 81.2  PLT 279 264   Lipid Panel: Recent Labs    04/30/18 0839 11/09/18 0813  CHOL 160 155  HDL 52 56  LDLCALC 89 84  TRIG 99 70  CHOLHDL 3.1 2.8   TSH: No results for input(s): TSH in the last  8760 hours. A1C: Lab Results  Component Value Date   HGBA1C 5.6 05/26/2014     Assessment/Plan 1. Gallstones Noted on recent ultrasound which is likely the cause for the elevated alk phos level. Discussed diet modifications and low fat diet. Currently without any pain but educated that she may have acute process if pain occurs and to seek medical attention.   2. Essential hypertension Controlled on home readings. Continue medication with dietary modifications.  4 month followup, sooner if needed Keerthi Hazell K. Harle Battiest  [facility] [phone]    Virtual Visit via Telephone Note  I connected with@ on 04/03/19 at  9:30 AM EDT by telephone and verified that I am speaking with the correct person using two identifiers.  Location: Patient: home Provider: office   I discussed the limitations, risks, security and privacy concerns of performing an evaluation and management service by telephone and the availability of in person appointments. I also discussed with the patient that there may be a patient responsible charge related to this service. The patient expressed understanding and agreed to proceed.   I discussed the assessment and treatment plan with the patient. The patient was provided an opportunity to ask questions and all were answered. The patient agreed with the plan and demonstrated an understanding of the instructions.   The patient was advised to call back or seek an in-person evaluation if the symptoms worsen or if the condition fails to improve as anticipated.  I provided 11 minutes of non-face-to-face time during this  encounter.  Carlos American. Harle Battiest Avs printed and mailed

## 2019-04-03 NOTE — Patient Instructions (Signed)
Cholelithiasis ° °Cholelithiasis is also called "gallstones." It is a kind of gallbladder disease. The gallbladder is an organ that stores a liquid (bile) that helps you digest fat. Gallstones may not cause symptoms (may be silent gallstones) until they cause a blockage, and then they can cause pain (gallbladder attack). °Follow these instructions at home: °· Take over-the-counter and prescription medicines only as told by your doctor. °· Stay at a healthy weight. °· Eat healthy foods. This includes: °? Eating fewer fatty foods, like fried foods. °? Eating fewer refined carbs (refined carbohydrates). Refined carbs are breads and grains that are highly processed, like white bread and white rice. Instead, choose whole grains like whole-wheat bread and brown rice. °? Eating more fiber. Almonds, fresh fruit, and beans are healthy sources of fiber. °· Keep all follow-up visits as told by your doctor. This is important. °Contact a doctor if: °· You have sudden pain in the upper right side of your belly (abdomen). Pain might spread to your right shoulder or your chest. This may be a sign of a gallbladder attack. °· You feel sick to your stomach (are nauseous). °· You throw up (vomit). °· You have been diagnosed with gallstones that have no symptoms and you get: °? Belly pain. °? Discomfort, burning, or fullness in the upper part of your belly (indigestion). °Get help right away if: °· You have sudden pain in the upper right side of your belly, and it lasts for more than 2 hours. °· You have belly pain that lasts for more than 5 hours. °· You have a fever or chills. °· You keep feeling sick to your stomach or you keep throwing up. °· Your skin or the whites of your eyes turn yellow (jaundice). °· You have dark-colored pee (urine). °· You have light-colored poop (stool). °Summary °· Cholelithiasis is also called "gallstones." °· The gallbladder is an organ that stores a liquid (bile) that helps you digest fat. °· Silent  gallstones are gallstones that do not cause symptoms. °· A gallbladder attack may cause sudden pain in the upper right side of your belly. Pain might spread to your right shoulder or your chest. If this happens, contact your doctor. °· If you have sudden pain in the upper right side of your belly that lasts for more than 2 hours, get help right away. °This information is not intended to replace advice given to you by your health care provider. Make sure you discuss any questions you have with your health care provider. °Document Released: 01/25/2008 Document Revised: 07/21/2017 Document Reviewed: 04/24/2016 °Elsevier Patient Education © 2020 [employer] ° °

## 2019-04-04 ENCOUNTER — Other Ambulatory Visit: Payer: Self-pay | Admitting: *Deleted

## 2019-04-04 MED ORDER — PRAVASTATIN SODIUM 40 MG PO TABS: 40.0000 mg | ORAL_TABLET | Freq: Every day | ORAL | 1 refills | Status: DC

## 2019-04-04 NOTE — Telephone Encounter (Signed)
[facility] 

## 2019-04-05 DIAGNOSIS — J3089 Other allergic rhinitis: Secondary | ICD-10-CM | POA: Diagnosis not present

## 2019-04-05 DIAGNOSIS — J301 Allergic rhinitis due to pollen: Secondary | ICD-10-CM | POA: Diagnosis not present

## 2019-04-05 DIAGNOSIS — J3081 Allergic rhinitis due to animal (cat) (dog) hair and dander: Secondary | ICD-10-CM | POA: Diagnosis not present

## 2019-04-15 DIAGNOSIS — N8110 Cystocele, unspecified: Secondary | ICD-10-CM | POA: Diagnosis not present

## 2019-04-15 DIAGNOSIS — Z01818 Encounter for other preprocedural examination: Secondary | ICD-10-CM | POA: Diagnosis not present

## 2019-04-15 DIAGNOSIS — N816 Rectocele: Secondary | ICD-10-CM | POA: Diagnosis not present

## 2019-04-16 NOTE — H&P (Addendum)
Wendy Figueroa is a 73 y.o.  female, P: 2-0-0-2 presents for anterior and posterior colporrhaphy w/enterocele and placement of tension -free vaginal tape because of symptomatic cystocele, rectocele and revision of tension free vaginal tape. The patient underwent placement of tension free vaginal tape and anterior-posterior colporrhaphy with mesh in 2010 because of symptomatic pelvic relaxation. For about 5 years she had resolution of her symptoms which included urinary incontinence however, after that time her symptoms gradually returned and worsened. When the patient reclines with her legs elevated then returns to a sitting position she will leak urine. Due to a problem with chronic cough, related in part to her asthma, she has frequent incontinence of urine and on occasion fecal incontinence. She admits to constipation and overactive bladder symptoms,  but denies any dysuria, pelvic pain, flank pain or history of renal stones. Additionally the patient began to notice several years ago some pink on her toilet tissue with bathroom visits and was found on exam to have some erosion of her previously placed vaginal tape.  Given that the patient is having worsening urinary symptoms and erosion of previously placed mesh,  she has decided to proceed with revision of  previously placed mesh and tension free vaginal tape and  an anterior-posterior colporrhaphy. The patient received medical clearance from her primary care physician.   Past Medical History  OB History: G: 2;      P: 2- 0-0-2;  SVB x 2   GYN History: menarche: 73 YO;  Menopausal;   Admits to a remote  history of abnormal PAP smear  with no specific treatment and they remained normal until her last one in 2010.  Medical History: Fecal Incontinence, Chronic Cough, Pulmonary Embolism (was anticoagulated x 1 year), Asthma, Colon Polyps, Osteoporosis and Over Active Bladder  Surgical History: 2010 Placement of Tension Free Vaginal Tape and A-P  Repair Denies problems with anesthesia or history of blood transfusions  Family History: Lung Cancer and Aneurysm  Social History: Married and Retired;  Denies tobacco use and rarely uses tobacco    Medications: Albuterol Sulfate 2.5 mg/3 mL Solution for Nebulization-prn Duloxetine 60 mg-daily Flonase 1-2 sprays per nostril-daily Furosemide 40 mg-daily Hydrocodone 10 mg-Chlorpheniramine 8 mg/5 mL- 5 mL every 12 hours prn Klor-Con 20 mEq- 2 tablets daily Losartan 25 mg-  3 tablets daily NIfedipine ER 60 mg-daily Pantoprazole 40 mg-twice a day Pravastatin 40 mg-daily Pro-Air HFA 90 mcg/actuation Inhaler-1-2 puffs every 6 hours prn Spiriva 18 mcg-ihnale 1 capsule daily Sympbicort 160 mcg-4.5 mcg/acuation HFA Inhaler-2 puffs twice a day Zyrtec 10 mg-daily prn Allergy Injections-every 2 weeks Montelukast 10 mg-daily Multivitamin-daily    Allergies  Allergen Reactions  . Demerol Hives    All over the body   . Lisinopril-Hydrochlorothiazide Hives    All over the body  . Penicillins Hives    Has patient had a PCN reaction causing immediate rash, facial/tongue/throat swelling, SOB or lightheadedness with hypotension: No Has patient had a PCN reaction causing severe rash involving mucus membranes or skin necrosis: No Has patient had a PCN reaction that required hospitalization: No Has patient had a PCN reaction occurring within the last 10 years: No If all of the above answers are "NO", then may proceed with Cephalosporin use.   All over the body    Denies sensitivity to peanuts, shellfish, soy or  latex.    The patient gets a rash from adhesives.  ROS: Admits to glasses, chronic cough, constipation,  left knee pain and pedal edema  but  denies headache, vision changes, nasal congestion, dysphagia, tinnitus, dizziness, hoarseness,  chest pain, shortness of breath, nausea, vomiting, diarrhea,   dysuria, hematuria, vaginitis symptoms, pelvic pain, swelling of joints,easy  bruising,  myalgias,  skin rashes, unexplained weight loss and except as is mentioned in the history of present illness, patient's review of systems is otherwise negative.   Physical Exam  Bp: 152/80   P: 80 bpm  R: 16    Temperature: 97 degrees F orally  Weight: 223 lbs.  Height: 5' 2.5"  BMI: 40.1  Neck: supple without masses or thyromegaly Lungs: clear to auscultation Heart: regular rate and rhythm Abdomen: soft, non-tender and no organomegaly Pelvic:EGBUS- wnl; vagina-3/4 cystocele and rectocele with erosion of mesh posterior vagina 0.5 cm from introitus, with palpable mesh anteriorly over urethral shaft;  uterus/ cervix-surgically absent; adnexae-no tenderness or masses Extremities:  no clubbing, cyanosis or edema   Assesment: Urinary Incontinence                      Symptomatic Cystocele/Rectocele                       Erosion of Tension Free Vaginal Tape   Disposition:  A discussion was held with patient regarding the indication for her procedure(s) along with the risks, which include but are not limited to: reaction to anesthesia, damage to adjacent organs, infection, excessive bleeding, worsening of symptoms and erosion of tension free vaginal tape.  The patient verbalized understanding of these risks and has consented to proceed with Tension Free Vaginal Tape, Anterior-Posterior Colporrhaphy with Enterocele Repair, Repair of mesh erosion and Cystoscopy at [facility] on May 15, 2019 at 9:30 a. m.   CSN# [id number]   Loralai Eisman J. Florene Glen, PA-C  for Dr. Harvie Bridge. Mancel Bale

## 2019-04-19 DIAGNOSIS — J3089 Other allergic rhinitis: Secondary | ICD-10-CM | POA: Diagnosis not present

## 2019-04-19 DIAGNOSIS — J301 Allergic rhinitis due to pollen: Secondary | ICD-10-CM | POA: Diagnosis not present

## 2019-04-19 DIAGNOSIS — J3081 Allergic rhinitis due to animal (cat) (dog) hair and dander: Secondary | ICD-10-CM | POA: Diagnosis not present

## 2019-04-26 DIAGNOSIS — J3081 Allergic rhinitis due to animal (cat) (dog) hair and dander: Secondary | ICD-10-CM | POA: Diagnosis not present

## 2019-04-26 DIAGNOSIS — J301 Allergic rhinitis due to pollen: Secondary | ICD-10-CM | POA: Diagnosis not present

## 2019-04-26 DIAGNOSIS — J3089 Other allergic rhinitis: Secondary | ICD-10-CM | POA: Diagnosis not present

## 2019-04-26 DIAGNOSIS — J4540 Moderate persistent asthma, uncomplicated: Secondary | ICD-10-CM | POA: Diagnosis not present

## 2019-05-03 ENCOUNTER — Ambulatory Visit: Payer: Self-pay

## 2019-05-03 ENCOUNTER — Encounter: Payer: Self-pay | Admitting: Nurse Practitioner

## 2019-05-03 DIAGNOSIS — J3081 Allergic rhinitis due to animal (cat) (dog) hair and dander: Secondary | ICD-10-CM | POA: Diagnosis not present

## 2019-05-03 DIAGNOSIS — J3089 Other allergic rhinitis: Secondary | ICD-10-CM | POA: Diagnosis not present

## 2019-05-03 DIAGNOSIS — J301 Allergic rhinitis due to pollen: Secondary | ICD-10-CM | POA: Diagnosis not present

## 2019-05-06 ENCOUNTER — Other Ambulatory Visit: Payer: Self-pay

## 2019-05-06 ENCOUNTER — Encounter: Payer: Self-pay | Admitting: Nurse Practitioner

## 2019-05-06 ENCOUNTER — Ambulatory Visit: Payer: PPO | Admitting: Nurse Practitioner

## 2019-05-06 DIAGNOSIS — Z1212 Encounter for screening for malignant neoplasm of rectum: Secondary | ICD-10-CM

## 2019-05-06 DIAGNOSIS — Z0000 Encounter for general adult medical examination without abnormal findings: Secondary | ICD-10-CM

## 2019-05-06 DIAGNOSIS — Z1211 Encounter for screening for malignant neoplasm of colon: Secondary | ICD-10-CM

## 2019-05-06 NOTE — Progress Notes (Signed)
Subjective:   Wendy Figueroa is a 73 y.o. female who presents for Medicare Annual (Subsequent) preventive examination.  Review of Systems:   Cardiac Risk Factors include: obesity (BMI >30kg/m2);advanced age (>50men, >55 women);dyslipidemia;hypertension     Objective:     Vitals: There were no vitals taken for this visit.  There is no height or weight on file to calculate BMI.  Advanced Directives 05/06/2019 03/20/2019 11/15/2018 07/17/2018 05/02/2018 04/30/2018 08/29/2017  Does Patient Have a Medical Advance Directive? No No No No No No No  Type of Advance Directive - - - - - - -  Does patient want to make changes to medical advance directive? - - - - - - -  Would patient like information on creating a medical advance directive? Yes (MAU/Ambulatory/Procedural Areas - Information given) Yes (MAU/Ambulatory/Procedural Areas - Information given) No - Patient declined - - Yes (MAU/Ambulatory/Procedural Areas - Information given) Yes (MAU/Ambulatory/Procedural Areas - Information given)    Tobacco Social History   Tobacco Use  Smoking Status Never Smoker  Smokeless Tobacco Never Used     Counseling given: Not Answered   Clinical Intake:  Pre-visit preparation completed: Yes  Pain : No/denies pain     BMI - recorded: 39.15 Nutritional Status: BMI > 30  Obese Nutritional Risks: None Diabetes: No  How often do you need to have someone help you when you read instructions, pamphlets, or other written materials from your doctor or pharmacy?: 1 - Never What is the last grade level you completed in school?: some college        Past Medical History:  Diagnosis Date  . Abnormal Pap smear    Over 63yrs ago  . ALKALINE PHOSPHATASE, ELEVATED 07/01/2009   Qualifier: Diagnosis of  By: [name] MD, [name]   . Allergic rhinitis   . Anemia   . Arthritis   . Asthma   . Cough    occasional  . Cystocele 11/05/08  . Diverticulosis of colon   . DJD (degenerative joint disease)    no  per pt  . Elevated alkaline phosphatase level   . GERD (gastroesophageal reflux disease)   . H/O urinary incontinence 11/06/2008  . H/O varicella   . Hard measles   . Hx of colonic polyps   . Hypercholesterolemia   . Hypertension   . Leg swelling    occasional  . 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 Overweight(278.02)   . Rectocele 11/06/2008   Large  . Sebaceous cyst    on back  . Sinusitis   . Venous insufficiency   . Wheezing    occasional  . Yeast infection    Past Surgical History:  Procedure Laterality Date  . ABDOMINAL HYSTERECTOMY  1988  . ANTERIOR AND POSTERIOR REPAIR  11/05/08  . APOGEE / PERIGEE REPAIR  10/2008   Dr. Octavio Manns  . bladder tack  2011  . BREAST SURGERY  1978   cyst removed from both removed  . COLONOSCOPY    . ESOPHAGOGASTRODUODENOSCOPY     Family History  Problem Relation Age of Onset  . Aneurysm Mother   . Lung cancer Father   . Hypertension Daughter   . Hypertension Daughter   . Colon cancer Neg Hx   . Esophageal cancer Neg Hx   . Stomach cancer Neg Hx   . Rectal cancer Neg Hx   . Colon polyps Neg Hx    Social History   Socioeconomic History  . Marital status: Married  Spouse name: [name]  . Number of children: 2  . Years of education: Not on file  . Highest education level: Not on file  Occupational History  . Occupation: LPN   Social Needs  . Financial resource strain: Not hard at all  . Food insecurity    Worry: Never true    Inability: Never true  . Transportation needs    Medical: No    Non-medical: No  Tobacco Use  . Smoking status: Never Smoker  . Smokeless tobacco: Never Used  Substance and Sexual Activity  . Alcohol use: Yes    Alcohol/week: 2.0 standard drinks    Types: 2 Cans of beer per week    Comment: 2 beers on the weekend  . Drug use: No  . Sexual activity: Not Currently    Partners: Male    Birth control/protection: None  Lifestyle  . Physical activity    Days per week: 0 days     Minutes per session: 0 min  . Stress: Only a little  Relationships  . Social connections    Talks on phone: More than three times a week    Gets together: More than three times a week    Attends religious service: More than 4 times per year    Active member of club or organization: Yes    Attends meetings of clubs or organizations: More than 4 times per year    Relationship status: Married  Other Topics Concern  . Not on file  Social History Narrative   Married   Never smoked   Alcohol few beers on week-end   No Advance Directive     Outpatient Encounter Medications as of 05/06/2019  Medication Sig  . albuterol (PROVENTIL HFA;VENTOLIN HFA) 108 (90 Base) MCG/ACT inhaler Inhale 2 puffs into the lungs every 6 (six) hours as needed for wheezing or shortness of breath.  . budesonide-formoterol (SYMBICORT) 160-4.5 MCG/ACT inhaler Inhale 2 puffs into the lungs 2 (two) times daily.  . Calcium Carb-Cholecalciferol (CALCIUM 600 + D PO) Take 1 tablet by mouth 2 (two) times daily.   . cetirizine (ZYRTEC) 10 MG tablet Take 10 mg by mouth daily.  . chlorpheniramine-HYDROcodone (TUSSIONEX PENNKINETIC ER) 10-8 MG/5ML SUER Take 5 mLs by mouth every 12 (twelve) hours as needed for cough.  . DULoxetine (CYMBALTA) 60 MG capsule Take 1 capsule (60 mg total) by mouth daily.  Marland Kitchen EPINEPHrine 0.3 mg/0.3 mL IJ SOAJ injection epinephrine 0.3 mg/0.3 mL injection, auto-injector  . fluticasone (FLONASE) 50 MCG/ACT nasal spray 2 sprays daily.  . furosemide (LASIX) 40 MG tablet TAKE 1 TABLET BY MOUTH EVERY DAY  . levocetirizine (XYZAL) 5 MG tablet Take 1 tablet (5 mg total) by mouth every evening.  Marland Kitchen losartan (COZAAR) 25 MG tablet Take 75 mg by mouth daily.  . montelukast (SINGULAIR) 10 MG tablet Take 10 mg by mouth at bedtime.    . Multiple Vitamins-Minerals (MACULAR VITAMIN BENEFIT PO) Take 1 capsule by mouth 2 (two) times daily.    Marland Kitchen neomycin-polymyxin b-dexamethasone (MAXITROL) 3.5-10000-0.1 OINT Place 3.5  application into the left eye as needed.  Marland Kitchen NIFEdipine (PROCARDIA XL/NIFEDICAL XL) 60 MG 24 hr tablet Take 1 tablet (60 mg total) by mouth daily.  . NON FORMULARY Allergy shots once every other week  . Omega-3 Fatty Acids (FISH OIL PO) Take by mouth daily.  . pantoprazole (PROTONIX) 40 MG tablet Take 1 tablet (40 mg total) by mouth 2 (two) times daily. For stomach  . potassium chloride SA (  KLOR-CON M20) 20 MEQ tablet Take two tablets by mouth once daily for potassium supplement  . pravastatin (PRAVACHOL) 40 MG tablet Take 1 tablet (40 mg total) by mouth daily.  . psyllium (METAMUCIL) 58.6 % packet Take 1 packet by mouth daily as needed (constipation).  . SPIRIVA HANDIHALER 18 MCG inhalation capsule Place 18 mcg into inhaler and inhale daily.   . vitamin B-12 (CYANOCOBALAMIN) 1000 MCG tablet Take 1,000 mcg by mouth daily.   No facility-administered encounter medications on file as of 05/06/2019.     Activities of Daily Living In your present state of health, do you have any difficulty performing the following activities: 05/06/2019  Hearing? N  Vision? N  Difficulty concentrating or making decisions? N  Walking or climbing stairs? N  Dressing or bathing? N  Doing errands, shopping? N  Preparing Food and eating ? N  Using the Toilet? N  In the past six months, have you accidently leaked urine? Y  Do you have problems with loss of bowel control? Y  Managing your Medications? N  Managing your Finances? N  Housekeeping or managing your Housekeeping? N  Some recent data might be hidden    Patient Care Team: Lauree Chandler, NP as PCP - General (Geriatric Medicine) Monna Fam, MD as Consulting Physician (Ophthalmology)    Assessment:   This is a routine wellness examination for [place].  Exercise Activities and Dietary recommendations Current Exercise Habits: Home exercise routine, Type of exercise: treadmill, Time (Minutes): 15, Frequency (Times/Week): 2, Weekly Exercise  (Minutes/Week): 30, Intensity: Mild  Goals    . Exercise 3x per week (30 min per time) (pt-stated)     Starting today I will get up early enough to go to the gym 2-3 times a week.    . Patient Stated     Better controlled of urinary incontinence.        Fall Risk Fall Risk  05/06/2019 07/17/2018 05/02/2018 04/30/2018 08/29/2017  Falls in the past year? 0 0 No No No  Number falls in past yr: 0 - - - -  Injury with Fall? 0 - - - -   Is the patient's home free of loose throw rugs in walkways, pet beds, electrical cords, etc?   yes      Grab bars in the bathroom? yes      Handrails on the stairs?   yes      Adequate lighting?   yes  Timed Get Up and Go performed: na  Depression Screen PHQ 2/9 Scores 05/06/2019 04/30/2018 08/29/2017 01/18/2017  PHQ - 2 Score 0 0 0 0     Cognitive Function MMSE - Mini Mental State Exam 04/30/2018 01/18/2017 09/09/2016 05/29/2015  Orientation to time 4 5 4 5   Orientation to Place 5 5 5 5   Registration 3 3 3 3   Attention/ Calculation 4 5 3 5   Recall 2 2 2 3   Language- name 2 objects 2 2 2 2   Language- repeat 1 1 1 1   Language- follow 3 step command 3 3 2 3   Language- read & follow direction 1 1 1 1   Write a sentence 1 1 1 1   Copy design 1 1 1 1   Total score 27 29 25 30      6CIT Screen 05/06/2019  What Year? 0 points  What month? 0 points  What time? 0 points  Count back from 20 0 points  Months in reverse 0 points  Repeat phrase 0 points  Total Score 0  Immunization History  Administered Date(s) Administered  . Influenza Split 06/11/2011, 05/27/2013  . Influenza Whole 07/05/2010, 04/23/2012  . Influenza, High Dose Seasonal PF 04/30/2018  . Influenza,inj,Quad PF,6+ Mos 05/29/2015, 05/06/2016, 05/17/2017  . Influenza-Unspecified 05/22/2014, 10/26/2015  . Pneumococcal Conjugate-13 10/14/2014  . Pneumococcal Polysaccharide-23 07/29/2010  . Tdap 08/22/2016    Qualifies for Shingles Vaccine? Yes, went to get but pharmacy was out of stock    Screening Tests Health Maintenance  Topic Date Due  . COLONOSCOPY  06/12/2018  . INFLUENZA VACCINE  03/23/2019  . TETANUS/TDAP  08/22/2026  . DEXA SCAN  Completed  . PNA vac Low Risk Adult  Completed  . Hepatitis C Screening  Discontinued    Cancer Screenings: Lung: Low Dose CT Chest recommended if Age 1-80 years, 30 pack-year currently smoking OR have quit w/in 15years. Patient does not qualify. Breast:  Up to date on Mammogram? Yes   Up to date of Bone Density/Dexa? Yes Colorectal: OVER DUE- referral placed  Additional Screenings:  Hepatitis C Screening: declined     Plan:      I have personally reviewed and noted the following in the patient's chart:   . Medical and social history . Use of alcohol, tobacco or illicit drugs  . Current medications and supplements . Functional ability and status . Nutritional status . Physical activity . Advanced directives . List of other physicians . Hospitalizations, surgeries, and ER visits in previous 12 months . Vitals . Screenings to include cognitive, depression, and falls . Referrals and appointments  In addition, I have reviewed and discussed with patient certain preventive protocols, quality metrics, and best practice recommendations. A written personalized care plan for preventive services as well as general preventive health recommendations were provided to patient.     Lauree Chandler, NP  05/06/2019

## 2019-05-06 NOTE — Progress Notes (Signed)
    This service is provided via telemedicine  No vital signs collected/recorded due to the encounter was a telemedicine visit.   Location of patient (ex: home, work):  Home  Patient consents to a telephone visit:  Yes  Location of the provider (ex: office, home): [facility], Office   Name of any referring provider:  N/A  Names of all persons participating in the telemedicine service and their role in the encounter:  S.Chrae B/CMA, Jessica Eubanks, NP, and Patient   Time spent on call:  11 min with medical assistant   

## 2019-05-06 NOTE — Patient Instructions (Signed)
Wendy Figueroa , Thank you for taking time to come for your Medicare Wellness Visit. I appreciate your ongoing commitment to your health goals. Please review the following plan we discussed and let me know if I can assist you in the future.   Screening recommendations/referrals: Colonoscopy OVER DUE- referral placed Mammogram up to date Bone Density up to date Recommended yearly ophthalmology/optometry visit for glaucoma screening and checkup Recommended yearly dental visit for hygiene and checkup  Vaccinations: Influenza vaccine NEED- to go to local pharmacy or make appt at office for flu shot Pneumococcal vaccine up to date Tdap vaccine up to date Shingles vaccine  NEED- to go to local pharmacy  Advanced directives: to complete and bring to office for [place] to place on file.   Conditions/risks identified: increase physical activity, weight loss encouraged through proper diet and exercise.   Next appointment: 1 year    Preventive Care 75 Years and Older, Female Preventive care refers to lifestyle choices and visits with your health care provider that can promote health and wellness. What does preventive care include?  A yearly physical exam. This is also called an annual well check.  Dental exams once or twice a year.  Routine eye exams. Ask your health care provider how often you should have your eyes checked.  Personal lifestyle choices, including:  Daily care of your teeth and gums.  Regular physical activity.  Eating a healthy diet.  Avoiding tobacco and drug use.  Limiting alcohol use.  Practicing safe sex.  Taking low-dose aspirin every day.  Taking vitamin and mineral supplements as recommended by your health care provider. What happens during an annual well check? The services and screenings done by your health care provider during your annual well check will depend on your age, overall health, lifestyle risk factors, and family history of disease. Counseling   Your health care provider may ask you questions about your:  Alcohol use.  Tobacco use.  Drug use.  Emotional well-being.  Home and relationship well-being.  Sexual activity.  Eating habits.  History of falls.  Memory and ability to understand (cognition).  Work and work Statistician.  Reproductive health. Screening  You may have the following tests or measurements:  Height, weight, and BMI.  Blood pressure.  Lipid and cholesterol levels. These may be checked every 5 years, or more frequently if you are over 51 years old.  Skin check.  Lung cancer screening. You may have this screening every year starting at age 35 if you have a 30-pack-year history of smoking and currently smoke or have quit within the past 15 years.  Fecal occult blood test (FOBT) of the stool. You may have this test every year starting at age 63.  Flexible sigmoidoscopy or colonoscopy. You may have a sigmoidoscopy every 5 years or a colonoscopy every 10 years starting at age 44.  Hepatitis C blood test.  Hepatitis B blood test.  Sexually transmitted disease (STD) testing.  Diabetes screening. This is done by checking your blood sugar (glucose) after you have not eaten for a while (fasting). You may have this done every 1-3 years.  Bone density scan. This is done to screen for osteoporosis. You may have this done starting at age 75.  Mammogram. This may be done every 1-2 years. Talk to your health care provider about how often you should have regular mammograms. Talk with your health care provider about your test results, treatment options, and if necessary, the need for more tests. Vaccines  Your health care provider may recommend certain vaccines, such as:  Influenza vaccine. This is recommended every year.  Tetanus, diphtheria, and acellular pertussis (Tdap, Td) vaccine. You may need a Td booster every 10 years.  Zoster vaccine. You may need this after age 17.  Pneumococcal 13-valent  conjugate (PCV13) vaccine. One dose is recommended after age 14.  Pneumococcal polysaccharide (PPSV23) vaccine. One dose is recommended after age 55. Talk to your health care provider about which screenings and vaccines you need and how often you need them. This information is not intended to replace advice given to you by your health care provider. Make sure you discuss any questions you have with your health care provider. Document Released: 09/04/2015 Document Revised: 04/27/2016 Document Reviewed: 06/09/2015 Elsevier Interactive Patient Education  2017 [place] Prevention in the Home Falls can cause injuries. They can happen to people of all ages. There are many things you can do to make your home safe and to help prevent falls. What can I do on the outside of my home?  Regularly fix the edges of walkways and driveways and fix any cracks.  Remove anything that might make you trip as you walk through a door, such as a raised step or threshold.  Trim any bushes or trees on the path to your home.  Use bright outdoor lighting.  Clear any walking paths of anything that might make someone trip, such as rocks or tools.  Regularly check to see if handrails are loose or broken. Make sure that both sides of any steps have handrails.  Any raised decks and porches should have guardrails on the edges.  Have any leaves, snow, or ice cleared regularly.  Use sand or salt on walking paths during winter.  Clean up any spills in your garage right away. This includes oil or grease spills. What can I do in the bathroom?  Use night lights.  Install grab bars by the toilet and in the tub and shower. Do not use towel bars as grab bars.  Use non-skid mats or decals in the tub or shower.  If you need to sit down in the shower, use a plastic, non-slip stool.  Keep the floor dry. Clean up any water that spills on the floor as soon as it happens.  Remove soap buildup in the tub or  shower regularly.  Attach bath mats securely with double-sided non-slip rug tape.  Do not have throw rugs and other things on the floor that can make you trip. What can I do in the bedroom?  Use night lights.  Make sure that you have a light by your bed that is easy to reach.  Do not use any sheets or blankets that are too big for your bed. They should not hang down onto the floor.  Have a firm chair that has side arms. You can use this for support while you get dressed.  Do not have throw rugs and other things on the floor that can make you trip. What can I do in the kitchen?  Clean up any spills right away.  Avoid walking on wet floors.  Keep items that you use a lot in easy-to-reach places.  If you need to reach something above you, use a strong step stool that has a grab bar.  Keep electrical cords out of the way.  Do not use floor polish or wax that makes floors slippery. If you must use wax, use non-skid floor wax.  Do  not have throw rugs and other things on the floor that can make you trip. What can I do with my stairs?  Do not leave any items on the stairs.  Make sure that there are handrails on both sides of the stairs and use them. Fix handrails that are broken or loose. Make sure that handrails are as long as the stairways.  Check any carpeting to make sure that it is firmly attached to the stairs. Fix any carpet that is loose or worn.  Avoid having throw rugs at the top or bottom of the stairs. If you do have throw rugs, attach them to the floor with carpet tape.  Make sure that you have a light switch at the top of the stairs and the bottom of the stairs. If you do not have them, ask someone to add them for you. What else can I do to help prevent falls?  Wear shoes that:  Do not have high heels.  Have rubber bottoms.  Are comfortable and fit you well.  Are closed at the toe. Do not wear sandals.  If you use a stepladder:  Make sure that it is fully  opened. Do not climb a closed stepladder.  Make sure that both sides of the stepladder are locked into place.  Ask someone to hold it for you, if possible.  Clearly mark and make sure that you can see:  Any grab bars or handrails.  First and last steps.  Where the edge of each step is.  Use tools that help you move around (mobility aids) if they are needed. These include:  Canes.  Walkers.  Scooters.  Crutches.  Turn on the lights when you go into a dark area. Replace any light bulbs as soon as they burn out.  Set up your furniture so you have a clear path. Avoid moving your furniture around.  If any of your floors are uneven, fix them.  If there are any pets around you, be aware of where they are.  Review your medicines with your doctor. Some medicines can make you feel dizzy. This can increase your chance of falling. Ask your doctor what other things that you can do to help prevent falls. This information is not intended to replace advice given to you by your health care provider. Make sure you discuss any questions you have with your health care provider. Document Released: 06/04/2009 Document Revised: 01/14/2016 Document Reviewed: 09/12/2014 Elsevier Interactive Patient Education  2017 [employer].

## 2019-05-08 ENCOUNTER — Telehealth: Payer: Self-pay | Admitting: Gastroenterology

## 2019-05-08 NOTE — Telephone Encounter (Signed)
Spoke to patient and reported to the patient that she could be scheduled for her colonoscopy but Dr. Loletha Carrow recommended that she wait until November or December after her cystocele surgery. The patient is okay with this. Clinical reminder sent.

## 2019-05-08 NOTE — Telephone Encounter (Signed)
I reviewed her last colonoscopy and polyp results from Oct 2014.    Yes, she can be directly booked for a colonoscopy with me in the [facility]. However, I recommend waiting until November or December since she has an upcoming cystocele surgery (9/23).

## 2019-05-08 NOTE — Telephone Encounter (Signed)
Dr. Loletha Carrow, please advise? Patient has not been seen in the office since 01/2016. Do you want an OV or she can be scheduled as a direct colon?

## 2019-05-08 NOTE — Telephone Encounter (Signed)
Pt called to schedule colon as she is overdue, she states that she is no linger taking xarelto, she stopped taking it 10 months ago. Does she still need an ov first? Or can she be a direct book? Please advise.

## 2019-05-11 ENCOUNTER — Other Ambulatory Visit (HOSPITAL_COMMUNITY)
Admission: RE | Admit: 2019-05-11 | Discharge: 2019-05-11 | Disposition: A | Discharge status: Home / Self Care | Payer: PPO | Source: Ambulatory Visit | Attending: Obstetrics and Gynecology | Admitting: Obstetrics and Gynecology

## 2019-05-11 DIAGNOSIS — Z01812 Encounter for preprocedural laboratory examination: Secondary | ICD-10-CM | POA: Diagnosis not present

## 2019-05-11 DIAGNOSIS — Z20828 Contact with and (suspected) exposure to other viral communicable diseases: Secondary | ICD-10-CM | POA: Diagnosis not present

## 2019-05-12 ENCOUNTER — Other Ambulatory Visit: Payer: Self-pay | Admitting: Nurse Practitioner

## 2019-05-12 LAB — NOVEL CORONAVIRUS, NAA (HOSP ORDER, SEND-OUT TO REF LAB; TAT 18-24 HRS): SARS-CoV-2, NAA: NOT DETECTED

## 2019-05-13 ENCOUNTER — Encounter (HOSPITAL_BASED_OUTPATIENT_CLINIC_OR_DEPARTMENT_OTHER): Payer: Self-pay | Admitting: Emergency Medicine

## 2019-05-13 ENCOUNTER — Other Ambulatory Visit: Payer: Self-pay

## 2019-05-13 ENCOUNTER — Encounter (HOSPITAL_COMMUNITY)
Admission: RE | Admit: 2019-05-13 | Discharge: 2019-05-13 | Disposition: A | Discharge status: Home / Self Care | Payer: PPO | Source: Ambulatory Visit | Attending: Obstetrics and Gynecology | Admitting: Obstetrics and Gynecology

## 2019-05-13 DIAGNOSIS — Z01812 Encounter for preprocedural laboratory examination: Secondary | ICD-10-CM | POA: Insufficient documentation

## 2019-05-13 LAB — CBC
HCT: 36.4 % (ref 36.0–46.0)
Hemoglobin: 11.3 g/dL — ABNORMAL LOW (ref 12.0–15.0)
MCH: 26.8 pg (ref 26.0–34.0)
MCHC: 31 g/dL (ref 30.0–36.0)
MCV: 86.3 fL (ref 80.0–100.0)
Platelets: 264 10*3/uL (ref 150–400)
RBC: 4.22 MIL/uL (ref 3.87–5.11)
RDW: 15.3 % (ref 11.5–15.5)
WBC: 6.5 10*3/uL (ref 4.0–10.5)
nRBC: 0 % (ref 0.0–0.2)

## 2019-05-13 LAB — BASIC METABOLIC PANEL
Anion gap: 9 (ref 5–15)
BUN: 12 mg/dL (ref 8–23)
CO2: 23 mmol/L (ref 22–32)
Calcium: 9.2 mg/dL (ref 8.9–10.3)
Chloride: 106 mmol/L (ref 98–111)
Creatinine, Ser: 0.94 mg/dL (ref 0.44–1.00)
GFR calc Af Amer: >60 mL/min (ref >60)
GFR calc non Af Amer: 59 mL/min — ABNORMAL LOW (ref >60)
Glucose, Bld: 123 mg/dL — ABNORMAL HIGH (ref 70–99)
Potassium: 4.2 mmol/L (ref 3.5–5.1)
Sodium: 138 mmol/L (ref 135–145)

## 2019-05-13 NOTE — Progress Notes (Signed)
SPOKE WITH: patient  ARRIVAL TIME:0700 RIDING HOME WITH: sherman [phone] NPO STATUS: after mn, sips of water with pill  AM MEDICATIONS: see documentation  PRE-OP ORDERS: yes LABS: appt 9-21 @2pm  COMMENTS/CONCERNS: n/a  ICE,;RN, BSN.

## 2019-05-13 NOTE — Telephone Encounter (Signed)
High risk or very high risk warning populated when attempting to refill medication. RX request sent to PCP for review and approve if warranted.

## 2019-05-14 DIAGNOSIS — J301 Allergic rhinitis due to pollen: Secondary | ICD-10-CM | POA: Diagnosis not present

## 2019-05-14 DIAGNOSIS — J3089 Other allergic rhinitis: Secondary | ICD-10-CM | POA: Diagnosis not present

## 2019-05-14 DIAGNOSIS — J3081 Allergic rhinitis due to animal (cat) (dog) hair and dander: Secondary | ICD-10-CM | POA: Diagnosis not present

## 2019-05-14 LAB — ABO/RH: ABO/RH(D): O POS

## 2019-05-14 MED ORDER — GENTAMICIN SULFATE 40 MG/ML IJ SOLN
5.0000 mg/kg | INTRAVENOUS | Status: AC
  Administered 2019-05-15: 360 mg via INTRAVENOUS
  Filled 2019-05-14 (×2): qty 9

## 2019-05-14 MED ORDER — CLINDAMYCIN PHOSPHATE 900 MG/50ML IV SOLN
900.0000 mg | INTRAVENOUS | Status: AC
  Administered 2019-05-15: 900 mg via INTRAVENOUS
  Filled 2019-05-14: qty 50

## 2019-05-15 ENCOUNTER — Observation Stay (HOSPITAL_BASED_OUTPATIENT_CLINIC_OR_DEPARTMENT_OTHER)
Admission: RE | Admit: 2019-05-15 | Discharge: 2019-05-16 | Disposition: A | Discharge status: Home / Self Care | Payer: PPO | Attending: Obstetrics and Gynecology | Admitting: Obstetrics and Gynecology

## 2019-05-15 ENCOUNTER — Encounter (HOSPITAL_BASED_OUTPATIENT_CLINIC_OR_DEPARTMENT_OTHER)
Admission: RE | Disposition: A | Discharge status: Home / Self Care | Payer: Self-pay | Source: Home / Self Care | Attending: Obstetrics and Gynecology

## 2019-05-15 ENCOUNTER — Observation Stay (HOSPITAL_BASED_OUTPATIENT_CLINIC_OR_DEPARTMENT_OTHER): Payer: PPO | Admitting: Certified Registered Nurse Anesthetist

## 2019-05-15 ENCOUNTER — Encounter (HOSPITAL_BASED_OUTPATIENT_CLINIC_OR_DEPARTMENT_OTHER): Payer: Self-pay | Admitting: *Deleted

## 2019-05-15 DIAGNOSIS — Z6841 Body Mass Index (BMI) 40.0 and over, adult: Secondary | ICD-10-CM | POA: Insufficient documentation

## 2019-05-15 DIAGNOSIS — K219 Gastro-esophageal reflux disease without esophagitis: Secondary | ICD-10-CM | POA: Diagnosis not present

## 2019-05-15 DIAGNOSIS — Z888 Allergy status to other drugs, medicaments and biological substances status: Secondary | ICD-10-CM | POA: Insufficient documentation

## 2019-05-15 DIAGNOSIS — R32 Unspecified urinary incontinence: Principal | ICD-10-CM | POA: Insufficient documentation

## 2019-05-15 DIAGNOSIS — M1990 Unspecified osteoarthritis, unspecified site: Secondary | ICD-10-CM | POA: Insufficient documentation

## 2019-05-15 DIAGNOSIS — Z86711 Personal history of pulmonary embolism: Secondary | ICD-10-CM | POA: Diagnosis not present

## 2019-05-15 DIAGNOSIS — F419 Anxiety disorder, unspecified: Secondary | ICD-10-CM | POA: Insufficient documentation

## 2019-05-15 DIAGNOSIS — N3281 Overactive bladder: Secondary | ICD-10-CM | POA: Insufficient documentation

## 2019-05-15 DIAGNOSIS — R159 Full incontinence of feces: Secondary | ICD-10-CM | POA: Diagnosis not present

## 2019-05-15 DIAGNOSIS — E7800 Pure hypercholesterolemia, unspecified: Secondary | ICD-10-CM | POA: Diagnosis not present

## 2019-05-15 DIAGNOSIS — N816 Rectocele: Secondary | ICD-10-CM | POA: Diagnosis not present

## 2019-05-15 DIAGNOSIS — J449 Chronic obstructive pulmonary disease, unspecified: Secondary | ICD-10-CM | POA: Insufficient documentation

## 2019-05-15 DIAGNOSIS — Z885 Allergy status to narcotic agent status: Secondary | ICD-10-CM | POA: Diagnosis not present

## 2019-05-15 DIAGNOSIS — Z880 Allergy status to penicillin: Secondary | ICD-10-CM | POA: Diagnosis not present

## 2019-05-15 DIAGNOSIS — Z7951 Long term (current) use of inhaled steroids: Secondary | ICD-10-CM | POA: Diagnosis not present

## 2019-05-15 DIAGNOSIS — Z79899 Other long term (current) drug therapy: Secondary | ICD-10-CM | POA: Insufficient documentation

## 2019-05-15 DIAGNOSIS — N8110 Cystocele, unspecified: Secondary | ICD-10-CM | POA: Diagnosis not present

## 2019-05-15 DIAGNOSIS — I10 Essential (primary) hypertension: Secondary | ICD-10-CM | POA: Insufficient documentation

## 2019-05-15 DIAGNOSIS — Z1159 Encounter for screening for other viral diseases: Secondary | ICD-10-CM | POA: Diagnosis not present

## 2019-05-15 HISTORY — PX: BLADDER SUSPENSION: SHX72

## 2019-05-15 HISTORY — PX: ANTERIOR AND POSTERIOR REPAIR: SHX5121

## 2019-05-15 HISTORY — PX: CYSTOSCOPY: SHX5120

## 2019-05-15 LAB — TYPE AND SCREEN
ABO/RH(D): O POS
Antibody Screen: NEGATIVE

## 2019-05-15 SURGERY — ANTERIOR (CYSTOCELE) AND POSTERIOR REPAIR (RECTOCELE)
Anesthesia: General | Site: Perineum

## 2019-05-15 MED ORDER — IBUPROFEN 600 MG PO TABS
600.0000 mg | ORAL_TABLET | Freq: Four times a day (QID) | ORAL | Status: DC
  Filled 2019-05-15: qty 1

## 2019-05-15 MED ORDER — ONDANSETRON HCL 4 MG/2ML IJ SOLN
4.0000 mg | Freq: Four times a day (QID) | INTRAMUSCULAR | Status: DC | PRN
  Filled 2019-05-15: qty 2

## 2019-05-15 MED ORDER — SUGAMMADEX SODIUM 200 MG/2ML IV SOLN
INTRAVENOUS | Status: DC | PRN
  Administered 2019-05-15: 200 mg via INTRAVENOUS

## 2019-05-15 MED ORDER — PROPOFOL 10 MG/ML IV BOLUS
INTRAVENOUS | Status: DC | PRN
  Administered 2019-05-15: 100 mg via INTRAVENOUS

## 2019-05-15 MED ORDER — TIOTROPIUM BROMIDE MONOHYDRATE 18 MCG IN CAPS
18.0000 ug | ORAL_CAPSULE | Freq: Every day | RESPIRATORY_TRACT | Status: DC
  Filled 2019-05-15: qty 5

## 2019-05-15 MED ORDER — ACETAMINOPHEN 500 MG PO TABS
ORAL_TABLET | ORAL | Status: AC
  Filled 2019-05-15: qty 2

## 2019-05-15 MED ORDER — FLUTICASONE FUROATE-VILANTEROL 200-25 MCG/INH IN AEPB
1.0000 | INHALATION_SPRAY | Freq: Every day | RESPIRATORY_TRACT | Status: DC
  Filled 2019-05-15 (×2): qty 28

## 2019-05-15 MED ORDER — FENTANYL CITRATE (PF) 100 MCG/2ML IJ SOLN
INTRAMUSCULAR | Status: DC | PRN
  Administered 2019-05-15 (×4): 50 ug via INTRAVENOUS

## 2019-05-15 MED ORDER — MIDAZOLAM HCL 2 MG/2ML IJ SOLN
INTRAMUSCULAR | Status: AC
  Filled 2019-05-15: qty 2

## 2019-05-15 MED ORDER — FUROSEMIDE 40 MG PO TABS
40.0000 mg | ORAL_TABLET | Freq: Every day | ORAL | Status: DC
  Administered 2019-05-15: 18:00:00 40 mg via ORAL
  Filled 2019-05-15 (×2): qty 1

## 2019-05-15 MED ORDER — PROMETHAZINE HCL 25 MG/ML IJ SOLN
6.2500 mg | INTRAMUSCULAR | Status: DC | PRN
  Filled 2019-05-15: qty 1

## 2019-05-15 MED ORDER — KETOROLAC TROMETHAMINE 30 MG/ML IJ SOLN
INTRAMUSCULAR | Status: AC
  Filled 2019-05-15: qty 1

## 2019-05-15 MED ORDER — ONDANSETRON HCL 4 MG/2ML IJ SOLN
INTRAMUSCULAR | Status: DC | PRN
  Administered 2019-05-15: 4 mg via INTRAVENOUS

## 2019-05-15 MED ORDER — DEXAMETHASONE SODIUM PHOSPHATE 10 MG/ML IJ SOLN
INTRAMUSCULAR | Status: AC
  Filled 2019-05-15: qty 1

## 2019-05-15 MED ORDER — FENTANYL CITRATE (PF) 100 MCG/2ML IJ SOLN
INTRAMUSCULAR | Status: AC
  Filled 2019-05-15: qty 2

## 2019-05-15 MED ORDER — ONDANSETRON HCL 4 MG PO TABS
4.0000 mg | ORAL_TABLET | Freq: Four times a day (QID) | ORAL | Status: DC | PRN
  Filled 2019-05-15: qty 1

## 2019-05-15 MED ORDER — ONDANSETRON HCL 4 MG/2ML IJ SOLN: INTRAMUSCULAR | Status: DC | PRN

## 2019-05-15 MED ORDER — MENTHOL 3 MG MT LOZG
1.0000 | LOZENGE | OROMUCOSAL | Status: DC | PRN
  Filled 2019-05-15: qty 9

## 2019-05-15 MED ORDER — ACETAMINOPHEN 500 MG PO TABS
1000.0000 mg | ORAL_TABLET | Freq: Once | ORAL | Status: AC
  Administered 2019-05-15: 08:00:00 1000 mg via ORAL
  Filled 2019-05-15: qty 2

## 2019-05-15 MED ORDER — FLUTICASONE PROPIONATE 50 MCG/ACT NA SUSP
2.0000 | Freq: Every day | NASAL | Status: DC
  Administered 2019-05-15: 18:00:00 2 via NASAL
  Filled 2019-05-15 (×2): qty 16

## 2019-05-15 MED ORDER — NIFEDIPINE ER 60 MG PO TB24
60.0000 mg | ORAL_TABLET | Freq: Every day | ORAL | Status: DC
  Filled 2019-05-15: qty 1

## 2019-05-15 MED ORDER — CLINDAMYCIN PHOSPHATE 900 MG/50ML IV SOLN
INTRAVENOUS | Status: AC
  Filled 2019-05-15: qty 50

## 2019-05-15 MED ORDER — PRAVASTATIN SODIUM 40 MG PO TABS
40.0000 mg | ORAL_TABLET | Freq: Every day | ORAL | Status: DC
  Administered 2019-05-15: 18:00:00 40 mg via ORAL
  Filled 2019-05-15 (×2): qty 1

## 2019-05-15 MED ORDER — MIDAZOLAM HCL 5 MG/5ML IJ SOLN
INTRAMUSCULAR | Status: DC | PRN
  Administered 2019-05-15 (×2): 1 mg via INTRAVENOUS

## 2019-05-15 MED ORDER — ESTRADIOL 0.1 MG/GM VA CREA
TOPICAL_CREAM | VAGINAL | Status: AC
  Filled 2019-05-15: qty 42.5

## 2019-05-15 MED ORDER — DEXAMETHASONE SODIUM PHOSPHATE 4 MG/ML IJ SOLN
INTRAMUSCULAR | Status: DC | PRN
  Administered 2019-05-15: 5 mg via INTRAVENOUS

## 2019-05-15 MED ORDER — PROPOFOL 10 MG/ML IV BOLUS
INTRAVENOUS | Status: AC
  Filled 2019-05-15: qty 20

## 2019-05-15 MED ORDER — MONTELUKAST SODIUM 10 MG PO TABS
10.0000 mg | ORAL_TABLET | Freq: Every day | ORAL | Status: DC
  Administered 2019-05-15: 10 mg via ORAL
  Filled 2019-05-15 (×2): qty 1

## 2019-05-15 MED ORDER — POTASSIUM CHLORIDE CRYS ER 20 MEQ PO TBCR
20.0000 meq | EXTENDED_RELEASE_TABLET | Freq: Every day | ORAL | Status: DC
  Administered 2019-05-15: 20 meq via ORAL
  Filled 2019-05-15 (×2): qty 1

## 2019-05-15 MED ORDER — ROCURONIUM BROMIDE 10 MG/ML (PF) SYRINGE
PREFILLED_SYRINGE | INTRAVENOUS | Status: AC
  Filled 2019-05-15: qty 10

## 2019-05-15 MED ORDER — PANTOPRAZOLE SODIUM 40 MG PO TBEC
40.0000 mg | DELAYED_RELEASE_TABLET | Freq: Two times a day (BID) | ORAL | Status: DC
  Administered 2019-05-15 (×2): 40 mg via ORAL
  Filled 2019-05-15 (×2): qty 1

## 2019-05-15 MED ORDER — MIDAZOLAM HCL 2 MG/2ML IJ SOLN
0.5000 mg | Freq: Once | INTRAMUSCULAR | Status: DC | PRN
  Filled 2019-05-15: qty 2

## 2019-05-15 MED ORDER — LORATADINE 10 MG PO TABS
10.0000 mg | ORAL_TABLET | ORAL | Status: DC
  Filled 2019-05-15: qty 1

## 2019-05-15 MED ORDER — ONDANSETRON HCL 4 MG/2ML IJ SOLN
INTRAMUSCULAR | Status: AC
  Filled 2019-05-15: qty 2

## 2019-05-15 MED ORDER — LACTATED RINGERS IV SOLN
INTRAVENOUS | Status: DC
  Administered 2019-05-15 (×2): via INTRAVENOUS
  Filled 2019-05-15: qty 1000

## 2019-05-15 MED ORDER — KETOROLAC TROMETHAMINE 30 MG/ML IJ SOLN
30.0000 mg | Freq: Four times a day (QID) | INTRAMUSCULAR | Status: DC
  Administered 2019-05-15 – 2019-05-16 (×3): 30 mg via INTRAVENOUS
  Filled 2019-05-15: qty 1

## 2019-05-15 MED ORDER — SODIUM CHLORIDE 0.9 % IR SOLN
Status: DC | PRN
  Administered 2019-05-15: 1000 mL via INTRAVESICAL

## 2019-05-15 MED ORDER — VASOPRESSIN 20 UNIT/ML IV SOLN
INTRAVENOUS | Status: DC | PRN
  Administered 2019-05-15: 51 mL via INTRAMUSCULAR

## 2019-05-15 MED ORDER — HYDROMORPHONE HCL 1 MG/ML IJ SOLN
0.2500 mg | INTRAMUSCULAR | Status: DC | PRN
  Filled 2019-05-15: qty 0.5

## 2019-05-15 MED ORDER — ROCURONIUM BROMIDE 100 MG/10ML IV SOLN
INTRAVENOUS | Status: DC | PRN
  Administered 2019-05-15: 60 mg via INTRAVENOUS

## 2019-05-15 MED ORDER — LIDOCAINE 2% (20 MG/ML) 5 ML SYRINGE
INTRAMUSCULAR | Status: AC
  Filled 2019-05-15: qty 5

## 2019-05-15 MED ORDER — OXYCODONE-ACETAMINOPHEN 5-325 MG PO TABS
ORAL_TABLET | ORAL | Status: AC
  Filled 2019-05-15: qty 1

## 2019-05-15 MED ORDER — OXYCODONE-ACETAMINOPHEN 5-325 MG PO TABS
1.0000 | ORAL_TABLET | Freq: Four times a day (QID) | ORAL | Status: DC | PRN
  Administered 2019-05-15 – 2019-05-16 (×2): 1 via ORAL
  Filled 2019-05-15: qty 1

## 2019-05-15 MED ORDER — 0.9 % SODIUM CHLORIDE (POUR BTL) OPTIME
TOPICAL | Status: DC | PRN
  Administered 2019-05-15: 1000 mL

## 2019-05-15 MED ORDER — ALBUTEROL SULFATE HFA 108 (90 BASE) MCG/ACT IN AERS
2.0000 | INHALATION_SPRAY | Freq: Four times a day (QID) | RESPIRATORY_TRACT | Status: DC | PRN
  Filled 2019-05-15: qty 6.7

## 2019-05-15 MED ORDER — HYDROMORPHONE HCL 2 MG/ML IJ SOLN
INTRAMUSCULAR | Status: AC
  Filled 2019-05-15: qty 1

## 2019-05-15 MED ORDER — LIDOCAINE HCL (CARDIAC) PF 100 MG/5ML IV SOSY
PREFILLED_SYRINGE | INTRAVENOUS | Status: DC | PRN
  Administered 2019-05-15: 80 mg via INTRAVENOUS

## 2019-05-15 MED ORDER — LEVOCETIRIZINE DIHYDROCHLORIDE 5 MG PO TABS: 5.0000 mg | ORAL_TABLET | Freq: Every evening | ORAL | Status: DC

## 2019-05-15 MED ORDER — DULOXETINE HCL 60 MG PO CPEP
60.0000 mg | ORAL_CAPSULE | Freq: Every day | ORAL | Status: DC
  Filled 2019-05-15: qty 1

## 2019-05-15 MED ORDER — SIMETHICONE 80 MG PO CHEW
80.0000 mg | CHEWABLE_TABLET | Freq: Four times a day (QID) | ORAL | Status: DC | PRN
  Filled 2019-05-15: qty 1

## 2019-05-15 MED ORDER — PANTOPRAZOLE SODIUM 40 MG PO TBEC
DELAYED_RELEASE_TABLET | ORAL | Status: AC
  Filled 2019-05-15: qty 1

## 2019-05-15 MED ORDER — LACTATED RINGERS IV SOLN
INTRAVENOUS | Status: DC
  Filled 2019-05-15: qty 1000

## 2019-05-15 MED ORDER — LORATADINE 10 MG PO TABS
10.0000 mg | ORAL_TABLET | Freq: Once | ORAL | Status: DC
  Filled 2019-05-15 (×2): qty 1

## 2019-05-15 MED ORDER — HYDROMORPHONE HCL 1 MG/ML IJ SOLN
INTRAMUSCULAR | Status: DC | PRN
  Administered 2019-05-15: 0.5 mg via INTRAVENOUS

## 2019-05-15 SURGICAL SUPPLY — 49 items
BAG URINE DRAINAGE (UROLOGICAL SUPPLIES) IMPLANT
BLADE SURG 11 STRL SS (BLADE) ×4 IMPLANT
BLADE SURG 15 STRL LF DISP TIS (BLADE) ×2 IMPLANT
BLADE SURG 15 STRL SS (BLADE) ×2
CANISTER SUCT 3000ML PPV (MISCELLANEOUS) ×4 IMPLANT
CATH FOLEY 2WAY SLVR  5CC 18FR (CATHETERS) ×2
CATH FOLEY 2WAY SLVR 5CC 18FR (CATHETERS) ×2 IMPLANT
CATH ROBINSON RED A/P 16FR (CATHETERS) ×4 IMPLANT
COVER WAND RF STERILE (DRAPES) ×4 IMPLANT
DECANTER SPIKE VIAL GLASS SM (MISCELLANEOUS) IMPLANT
DERMABOND ADVANCED (GAUZE/BANDAGES/DRESSINGS) ×2
DERMABOND ADVANCED .7 DNX12 (GAUZE/BANDAGES/DRESSINGS) ×2 IMPLANT
DRAPE SHEET LG 3/4 BI-LAMINATE (DRAPES) ×8 IMPLANT
GAUZE PACKING 1 X5 YD ST (GAUZE/BANDAGES/DRESSINGS) IMPLANT
GAUZE PACKING 2X5 YD STRL (GAUZE/BANDAGES/DRESSINGS) ×4 IMPLANT
GAUZE SPONGE 4X4 16PLY XRAY LF (GAUZE/BANDAGES/DRESSINGS) ×8 IMPLANT
GLOVE BIO SURGEON STRL SZ7 (GLOVE) ×8 IMPLANT
GLOVE BIO SURGEON STRL SZ7.5 (GLOVE) ×4 IMPLANT
GLOVE BIOGEL PI IND STRL 7.0 (GLOVE) ×2 IMPLANT
GLOVE BIOGEL PI IND STRL 7.5 (GLOVE) ×2 IMPLANT
GLOVE BIOGEL PI INDICATOR 7.0 (GLOVE) ×2
GLOVE BIOGEL PI INDICATOR 7.5 (GLOVE) ×2
GOWN STRL REUS W/ TWL LRG LVL3 (GOWN DISPOSABLE) ×8 IMPLANT
GOWN STRL REUS W/TWL LRG LVL3 (GOWN DISPOSABLE) ×8
HIBICLENS CHG 4% 4OZ BTL (MISCELLANEOUS) IMPLANT
HOLDER FOLEY CATH W/STRAP (MISCELLANEOUS) ×4 IMPLANT
KIT TURNOVER CYSTO (KITS) ×4 IMPLANT
KIT TURNOVER KIT B (KITS) ×4 IMPLANT
NEEDLE HYPO 22GX1.5 SAFETY (NEEDLE) ×4 IMPLANT
NS IRRIG 1000ML POUR BTL (IV SOLUTION) ×4 IMPLANT
PACK VAGINAL WOMENS (CUSTOM PROCEDURE TRAY) ×4 IMPLANT
SET CYSTO W/LG BORE CLAMP LF (SET/KITS/TRAYS/PACK) ×4 IMPLANT
SET IRRIG Y TYPE TUR BLADDER L (SET/KITS/TRAYS/PACK) ×4 IMPLANT
SLING TRANS VAGINAL TAPE (Sling) ×2 IMPLANT
SLING UTERINE/ABD GYNECARE TVT (Sling) ×2 IMPLANT
SPECIMEN JAR MEDIUM (MISCELLANEOUS) IMPLANT
SPONGE HEMORRHOID 8X3CM (HEMOSTASIS) ×4 IMPLANT
SUT VIC AB 0 CT1 18XCR BRD8 (SUTURE) ×2 IMPLANT
SUT VIC AB 0 CT1 8-18 (SUTURE) ×2
SUT VIC AB 1 CT1 36 (SUTURE) IMPLANT
SUT VIC AB 2-0 CT1 27 (SUTURE) ×8
SUT VIC AB 2-0 CT1 TAPERPNT 27 (SUTURE) ×8 IMPLANT
SUT VIC AB 2-0 SH 27 (SUTURE) ×14
SUT VIC AB 2-0 SH 27XBRD (SUTURE) ×14 IMPLANT
SUT VIC AB 3-0 SH 27 (SUTURE)
SUT VIC AB 3-0 SH 27X BRD (SUTURE) IMPLANT
TOWEL OR 17X26 10 PK STRL BLUE (TOWEL DISPOSABLE) ×4 IMPLANT
TRAY FOLEY W/BAG SLVR 16FR (SET/KITS/TRAYS/PACK) ×2
TRAY FOLEY W/BAG SLVR 16FR ST (SET/KITS/TRAYS/PACK) ×2 IMPLANT

## 2019-05-15 NOTE — Anesthesia Preprocedure Evaluation (Addendum)
Anesthesia Evaluation  Patient identified by MRN, date of birth, ID band Patient awake    Reviewed: Allergy & Precautions, NPO status , Patient's Chart, lab work & pertinent test results  History of Anesthesia Complications Negative for: history of anesthetic complications  Airway Mallampati: II  TM Distance: >3 FB Neck ROM: Full    Dental  (+) Dental Advisory Given, Missing   Pulmonary COPD,  COPD inhaler, PE (remote history, no longer on blood thinners) 05/11/2019 SARS coronavirus NEG   breath sounds clear to auscultation       Cardiovascular hypertension, Pt. on medications (-) angina Rhythm:Regular Rate:Normal  '19 ECHO: EF 55-60%, valves OK   Neuro/Psych Anxiety negative neurological ROS     GI/Hepatic GERD  Medicated and Controlled,H/o elevated LFTs   Endo/Other  Morbid obesity  Renal/GU negative Renal ROS     Musculoskeletal  (+) Arthritis , Osteoarthritis,    Abdominal (+) + obese,   Peds  Hematology negative hematology ROS (+)   Anesthesia Other Findings   Reproductive/Obstetrics                            Anesthesia Physical Anesthesia Plan  ASA: III  Anesthesia Plan: General   Post-op Pain Management:    Induction: Intravenous  PONV Risk Score and Plan: 3 and Ondansetron, Dexamethasone and Treatment may vary due to age or medical condition  Airway Management Planned: Oral ETT  Additional Equipment:   Intra-op Plan:   Post-operative Plan: Extubation in OR  Informed Consent: I have reviewed the patients History and Physical, chart, labs and discussed the procedure including the risks, benefits and alternatives for the proposed anesthesia with the patient or authorized representative who has indicated his/her understanding and acceptance.     Dental advisory given  Plan Discussed with: CRNA and Surgeon  Anesthesia Plan Comments:        Anesthesia Quick  Evaluation

## 2019-05-15 NOTE — Anesthesia Procedure Notes (Signed)
Procedure Name: Intubation Date/Time: 05/15/2019 9:25 AM Performed by: Bufford Spikes, CRNA Pre-anesthesia Checklist: Patient identified, Emergency Drugs available, Suction available and Patient being monitored Patient Re-evaluated:Patient Re-evaluated prior to induction Oxygen Delivery Method: Circle system utilized Preoxygenation: Pre-oxygenation with 100% oxygen Induction Type: IV induction Ventilation: Mask ventilation without difficulty Laryngoscope Size: Miller and 2 Grade View: Grade II Tube type: Oral Tube size: 7.0 mm Number of attempts: 1 Airway Equipment and Method: Stylet and Oral airway Placement Confirmation: ETT inserted through vocal cords under direct vision,  positive ETCO2 and breath sounds checked- equal and bilateral Secured at: 21 cm Tube secured with: Tape Dental Injury: Teeth and Oropharynx as per pre-operative assessment

## 2019-05-15 NOTE — Op Note (Signed)
Preop Diagnosis: 1.Cystocele and Rectocele 2.Urinary Incontinence  Postop Diagnosis: 1.Cystocele and Rectocele 2.Urinary Incontinence  Procedure: 1.ANTERIOR AND POSTERIOR COLPORRHAPHY/REPAIR 2.CYSTOSCOPY 3.TVT  Anesthesia: General   Attending: Everett Graff, MD   Assistant: Earnstine Regal, PA-C  Findings: Cystocele and rectocele, mesh in place 50mm area on posterior wall showing through  Pathology: N/a  Fluids: 1000 cc  UOP: 300 cc  EBL: 50 cc  Complications: None  Procedure: The patient was taken to the operating room after the risks, benefits and alternatives were discussed with the patient, the patient verbalized understanding and consent signed and witnessed. A timeout was performed per protocol. The patient was prepped and draped in the normal sterile fashion in the dorsal lithotomy position and the patient was placed under general anesthesia per the anesthesiologist. A weighted speculum was placed in the patient's vagina and the anterior vaginal wall was retracted using Deaver retractors. The anterior vaginal wall was injected with dilute pitressin and the anterior vaginal wall was then incised and dissected away from the underlying layer of tissue. The cystocele was repaired with a purse string stitch of 2-0 vicryl. The excess vaginal wall tissue was excised and repaired with running interlocking stitch of 2-0 Vicryl.   A weighted speculum was placed in the patient's vagina and the anterior vaginal wall was injected with dilute pitressin at a concentration of 20 units of pitressin in a total of 100cc of normal saline.  An incision was made in the anterior wall of the vagina for approximately 1cm beneath the midurethra and the underlying tissue was dissected away from the anterior vaginal wall down to the level of the lower symphysis pubis bilaterally. Attention was then turned to the mons pubis where two 5 mm incisions were made 2 fingerbreadths from the midline. The  transabdominal guide was then passed through the mons pubis incision on the patient's right down through the space of Retzius and out through the anterior vaginal wall after deflecting the rigid urethral catheter guide to the ipsilateral side. The same was done on the contralateral side. Cystoscopy was performed and no invadvertant bladder injury was noted. The bladder was drained with a Foley while deflecting the rigid urethral catheter guide to the patient's right and the mesh was attached to the transabdominal guide and elevated up through the space of Retzius and out through the incision on the mons pubis on the ipsilateral side. The same was done on the contralateral side. Cystoscopy was performed again and no inadvertant bladder injury was noted. The 11 French Foley was left in the urethra and a large Claiborne Billings was placed between the urethra and the mesh in order to leave the mesh slack beneath the midurethra. The mesh was then cut flush with the skin at the mons pubis incisions bilaterally.  Cystoscopy was performed again and bilateral ureters were noted to efflux without difficulty. The bilateral incisions on the mons pubis were then cleaned and Dermabond applied. The anterior vaginal wall incision was repaired with 2-0 vicryl with a running interlocking stitch.  Vagina was packed with 1 inch vaginal packing.  Sponge, lap and needle count was correct.  The patient tolerated the procedure well and was returned to the recovery room in good condition.   Attention was then turned to the posterior vaginal wall where dilute Pitressin was injected. The posterior vaginal wall was incised and the underlying tissue was dissected away from the posterior vaginal wall. The rectocele was repaired using plication stitches of 2-0 Vicryl. Excess posterior vaginal wall tissue  was excised and the posterior vaginal wall repaired with 2-0 vicryl via a running interlocking stitch. The perineum was repaired with 2-0 Vicryl via a  subcuticular stitch. Cystoscopy was performed and bilateral ureters were noted to efflux without difficulty and there were no inadvertent bladder injuries noted. The vagina was packed with estrogen-soaked packing. The patient tolerated the procedure well and was awaiting return to the recovery room in good condition.

## 2019-05-15 NOTE — Anesthesia Postprocedure Evaluation (Signed)
Anesthesia Post Note  Patient: Wendy Figueroa  Procedure(s) Performed: ANTERIOR (CYSTOCELE) AND POSTERIOR REPAIR (RECTOCELE) (N/A Perineum) TRANSVAGINAL TAPE (TVT) PROCEDURE (N/A Perineum) CYSTOSCOPY (N/A Bladder)     Patient location during evaluation: PACU Anesthesia Type: General Level of consciousness: awake and alert, patient cooperative and oriented Pain management: pain level controlled Vital Signs Assessment: post-procedure vital signs reviewed and stable Respiratory status: spontaneous breathing, nonlabored ventilation and respiratory function stable Cardiovascular status: blood pressure returned to baseline and stable Postop Assessment: no apparent nausea or vomiting Anesthetic complications: no    Last Vitals:  Vitals:   05/15/19 1245 05/15/19 1304  BP: 127/76 (!) 141/79  Pulse: 96 (!) 101  Resp: 15   Temp:  36.5 C  SpO2: 97% (!) 89%    Last Pain:  Vitals:   05/15/19 1245  TempSrc:   PainSc: 0-No pain                 Cora Stetson,E. Harvie Morua

## 2019-05-15 NOTE — Transfer of Care (Signed)
Immediate Anesthesia Transfer of Care Note  Patient: Wendy Figueroa  Procedure(s) Performed: ANTERIOR (CYSTOCELE) AND POSTERIOR REPAIR (RECTOCELE) (N/A Perineum) TRANSVAGINAL TAPE (TVT) PROCEDURE (N/A Perineum) CYSTOSCOPY (N/A Bladder)  Patient Location: PACU  Anesthesia Type:General  Level of Consciousness: awake, alert  and oriented  Airway & Oxygen Therapy: Patient Spontanous Breathing and Patient connected to nasal cannula oxygen  Post-op Assessment: Report given to RN and Post -op Vital signs reviewed and stable  Post vital signs: Reviewed and stable  Last Vitals:  Vitals Value Taken Time  BP 132/73 05/15/19 1205  Temp    Pulse 100 05/15/19 1210  Resp 18 05/15/19 1210  SpO2 99 % 05/15/19 1210  Vitals shown include unvalidated device data.  Last Pain:  Vitals:   05/15/19 0731  TempSrc:   PainSc: 0-No pain      Patients Stated Pain Goal: 7 ([id number])  Complications: No apparent anesthesia complications

## 2019-05-16 DIAGNOSIS — R32 Unspecified urinary incontinence: Secondary | ICD-10-CM | POA: Diagnosis not present

## 2019-05-16 LAB — CBC
HCT: 35.8 % — ABNORMAL LOW (ref 36.0–46.0)
Hemoglobin: 11.1 g/dL — ABNORMAL LOW (ref 12.0–15.0)
MCH: 26.7 pg (ref 26.0–34.0)
MCHC: 31 g/dL (ref 30.0–36.0)
MCV: 86.3 fL (ref 80.0–100.0)
Platelets: 253 10*3/uL (ref 150–400)
RBC: 4.15 MIL/uL (ref 3.87–5.11)
RDW: 15.2 % (ref 11.5–15.5)
WBC: 14.3 10*3/uL — ABNORMAL HIGH (ref 4.0–10.5)
nRBC: 0 % (ref 0.0–0.2)

## 2019-05-16 MED ORDER — KETOROLAC TROMETHAMINE 30 MG/ML IJ SOLN
INTRAMUSCULAR | Status: AC
  Filled 2019-05-16: qty 1

## 2019-05-16 MED ORDER — OXYCODONE-ACETAMINOPHEN 5-325 MG PO TABS
ORAL_TABLET | ORAL | Status: AC
  Filled 2019-05-16: qty 1

## 2019-05-16 MED ORDER — OXYCODONE-ACETAMINOPHEN 5-325 MG PO TABS: ORAL_TABLET | ORAL | 0 refills | Status: DC

## 2019-05-16 MED ORDER — IBUPROFEN 600 MG PO TABS: ORAL_TABLET | ORAL | 0 refills | Status: DC

## 2019-05-16 MED ORDER — NITROFURANTOIN MONOHYD MACRO 100 MG PO CAPS: 100.0000 mg | ORAL_CAPSULE | Freq: Two times a day (BID) | ORAL | 0 refills | Status: AC

## 2019-05-16 NOTE — Discharge Summary (Signed)
Physician Discharge Summary  Patient ID: Wendy Figueroa MRN: [id number] DOB/AGE: 1946-04-02 73 y.o.  Admit date: 05/15/2019 Discharge date: 05/16/2019   Discharge Diagnoses:  Active Problems:   Urinary incontinence   Cystocele with rectocele   Operation:  Anteriioir-Posterior Colporrhaphy and Cystoscopy     Discharged Condition: Good  Hospital Course: On the date of admission the patient underwent the aforementioned procedures and tolerated them well.  Post operative course was unremarkable with the patient resuming bowel and bladder function by post operative day #1 and was therefore deemed ready for discharge home. The patient voided 450 cc prior to discharge without difficulty.  Her discharge hemoglobin was 11.1/35.8.  Disposition: Discharge disposition: 01-Home or Self Care       Discharge Medications:  Allergies as of 05/16/2019      Reactions   Demerol Hives   All over the body    Lisinopril-hydrochlorothiazide Hives   All over the body   Penicillins Hives   Has patient had a PCN reaction causing immediate rash, facial/tongue/throat swelling, SOB or lightheadedness with hypotension: No Has patient had a PCN reaction causing severe rash involving mucus membranes or skin necrosis: No Has patient had a PCN reaction that required hospitalization: No Has patient had a PCN reaction occurring within the last 10 years: No If all of the above answers are "NO", then may proceed with Cephalosporin use. All over the body      Medication List    TAKE these medications   albuterol 108 (90 Base) MCG/ACT inhaler Commonly known as: VENTOLIN HFA Inhale 2 puffs into the lungs every 6 (six) hours as needed for wheezing or shortness of breath.   budesonide-formoterol 160-4.5 MCG/ACT inhaler Commonly known as: Symbicort Inhale 2 puffs into the lungs 2 (two) times daily.   CALCIUM 600 + D PO Take 1 tablet by mouth 2 (two) times daily.   cetirizine 10 MG tablet Commonly known  as: ZYRTEC Take 10 mg by mouth daily.   chlorpheniramine-HYDROcodone 10-8 MG/5ML Suer Commonly known as: Tussionex Pennkinetic ER Take 5 mLs by mouth every 12 (twelve) hours as needed for cough.   DULoxetine 60 MG capsule Commonly known as: CYMBALTA Take 1 capsule (60 mg total) by mouth daily.   EPINEPHrine 0.3 mg/0.3 mL Soaj injection Commonly known as: EPI-PEN epinephrine 0.3 mg/0.3 mL injection, auto-injector   FISH OIL PO Take by mouth daily.   fluticasone 50 MCG/ACT nasal spray Commonly known as: FLONASE 2 sprays daily.   furosemide 40 MG tablet Commonly known as: LASIX TAKE 1 TABLET BY MOUTH EVERY DAY   ibuprofen 600 MG tablet Commonly known as: ADVIL take 1 tablet po pc every 6 hours for 5 days then as needed for post operative pain   levocetirizine 5 MG tablet Commonly known as: XYZAL Take 1 tablet (5 mg total) by mouth every evening.   losartan 25 MG tablet Commonly known as: COZAAR TAKE 3 TABLETS DAILY   MACULAR VITAMIN BENEFIT PO Take 1 capsule by mouth 2 (two) times daily.   montelukast 10 MG tablet Commonly known as: SINGULAIR Take 10 mg by mouth at bedtime.   neomycin-polymyxin b-dexamethasone 3.5-10000-0.1 Oint Commonly known as: MAXITROL Place 3.5 application into the left eye as needed.   NIFEdipine 60 MG 24 hr tablet Commonly known as: PROCARDIA XL/NIFEDICAL XL Take 1 tablet (60 mg total) by mouth daily.   nitrofurantoin (macrocrystal-monohydrate) 100 MG capsule Commonly known as: Macrobid Take 1 capsule (100 mg total) by mouth 2 (two) times daily  for 5 days.   NON FORMULARY Allergy shots once every other week   oxyCODONE-acetaminophen 5-325 MG tablet Commonly known as: Percocet take 1 tablet po every 6 hours as needed for breakthrough post operative pain   pantoprazole 40 MG tablet Commonly known as: PROTONIX Take 1 tablet (40 mg total) by mouth 2 (two) times daily. For stomach   potassium chloride SA 20 MEQ tablet Commonly known  as: Klor-Con M20 Take two tablets by mouth once daily for potassium supplement   pravastatin 40 MG tablet Commonly known as: PRAVACHOL Take 1 tablet (40 mg total) by mouth daily.   psyllium 58.6 % packet Commonly known as: METAMUCIL Take 1 packet by mouth daily as needed (constipation).   Spiriva HandiHaler 18 MCG inhalation capsule Generic drug: tiotropium Place 18 mcg into inhaler and inhale daily.   vitamin B-12 1000 MCG tablet Commonly known as: CYANOCOBALAMIN Take 1,000 mcg by mouth daily.          Follow-up: Dr. Harvie Bridge. Mancel Bale on June 24, 2019 at 2:15 p.m.     Signed: Earnstine Regal, PA-C 05/16/2019, 8:34 AM

## 2019-05-16 NOTE — Discharge Instructions (Signed)
Anterior and Posterior Colporrhaphy and Sling Procedure, Care After This sheet gives you information about how to care for yourself after your procedure. Your health care provider may also give you more specific instructions. If you have problems or questions, contact your health care provider. What can I expect after the procedure? After the procedure, it is common to have:  Pain in the surgical area.  Vaginal discharge. You will need to use a sanitary pad during this time.  Fatigue. Follow these instructions at home: Incision care   Follow instructions from your health care provider about how to take care of your incision. Make sure you: ? Wash your hands with soap and water before touching the incision area. If soap and water are not available, use hand sanitizer. ? Clean your incision as told by your health care provider. ? Leave stitches (sutures), skin glue, or adhesive strips in place. These skin closures may need to stay in place for 2 weeks or longer. If adhesive strip edges start to loosen and curl up, you may trim the loose edges. Do not remove adhesive strips completely unless your health care provider tells you to do that.  Check your incision area every day for signs of infection. Check for: ? Redness, swelling, or pain. ? Fluid or blood. ? Warmth. ? Pus or a bad smell.  Check your incision every day to make sure the incision area is not separating or opening.  Do not take baths, swim, or use a hot tub until your health care provider approves. You may shower.  Keep the area between your vagina and rectum (perineal area) clean and dry. Make sure you clean the area after each bowel movement and each time you urinate.  Ask your health care provider if you can take a sitz bath or sit in a tub of clean, warm water. Activity  Do gentle, daily activity as told by your health care provider. You may be told to take short walks every day and go farther each time. Ask your health  care provider what activities are safe for you.  Limit stair climbing to once or twice a day in the first week, then slowly increase this activity.  Do not lift anything that is heavier than 10 lbs. (4.5 kg), or the limit that your health care provider tells you, until he or she says that it is safe. Avoid pushing or pulling motions.  Avoid standing for long periods of time.  Do not douche, use tampons, or have sex until your health care provider says it is okay.  Do not drive or use heavy machinery while taking prescription pain medicine. To prevent constipation  To prevent or treat constipation while you are taking prescription pain medicine, your health care provider may recommend that you: ? Take over-the-counter or prescription medicines. ? Eat foods that are high in fiber, such as fresh fruits and vegetables, whole grains, and beans. ? Drink enough fluid to keep your urine clear or pale yellow. ? Limit foods that are high in fat and processed sugars, such as fried and sweet foods. General instructions  You may be instructed to do pelvic floor exercises (kegels) as told by your health care provider.  Take over-the-counter and prescription medicines only as told by your health care provider.  Keep all follow-up visits as told by your health care provider. This is important. Contact a health care provider if:  Medicine does not help your pain.  You have frequent or urgent urination, or  you are unable to completely empty your bladder.  You feel a burning sensation when urinating.  You have fluid or blood coming from your incision.  You have pus or a bad smell coming from the incision.  Your incision feels warm to the touch.  You have redness, swelling, or pain around your incision. Get help right away if:  You have a fever or chills.  Your incision separates or opens.  You cannot urinate.  You have trouble breathing. Summary  After the procedure, it is common to  have pain, fatigue, and discharge from the vagina.  Keep the area between your vagina and rectum (perineal area) clean and dry. Make sure you clean the area after each bowel movement and each time you urinate.  Follow instructions from your health care provider on any activity restrictions after the procedure. This information is not intended to replace advice given to you by your health care provider. Make sure you discuss any questions you have with your health care provider. Document Released: 03/22/2004 Document Revised: 07/21/2017 Document Reviewed: 08/08/2016 Elsevier Patient Education  2020 [employer]. Call [facility] @ [phone] if:  You have a temperature greater than or equal to 100.4 degrees Farenheit orally You have pain that is not made better by the pain medication given and taken as directed You have excessive bleeding or problems urinating  Take Colace (Docusate Sodium/Stool Softener) 100 mg 2-3 times daily while taking narcotic pain medicine to avoid constipation or until bowel movements are regular. Take, with food, Ibuprofen 600 mg every 6 hours for 5 days then as needed for pain.  Purchase and use over the counter vaginal lubricant (K-Y Jelly) daily.  Place lubricant on finger and place inside the vagina daily (do not use applicator) Take the antibiotics ( (nitrofurantoin) prescribed, twice a day with food for 5 days   You may drive after 1 week You may walk up steps  You may shower  You may resume a regular diet  Keep incisions clean and dry Do not lift over 15 pounds for 6 weeks  Avoid anything in vagina for 6 weeks.  Follow up with Dr. Mancel Bale on June 24, 2019 at 2:15 p.m.

## 2019-05-16 NOTE — Progress Notes (Signed)
Removed patient's packing and then foley at 0600am

## 2019-05-16 NOTE — Progress Notes (Signed)
Wendy Figueroa is a47 y.o.  [id number]  Post Op Date # 1: Anterior-Posterior Colporrhaphy/Cystoscopy  Subjective: Patient is Doing well postoperatively. Patient has Pain is controlled with current analgesics. Medications being used: prescription NSAID's including ketorolac 30 mg IV. The patient has ambulated in the halls without dizziness, is tolerating a regular diet and has voided 450 cc of slightly pink urine with sensation of complete bladder emptying and no post void dribbling.  Objective: Vital signs in last 24 hours: Temp:  [97 F (36.1 C)-99.3 F (37.4 C)] 97.3 F (36.3 C) (09/24 0548) Pulse Rate:  [91-110] 91 (09/24 0548) Resp:  [15-22] 16 (09/24 0548) BP: (115-148)/(60-92) 134/75 (09/24 0548) SpO2:  [89 %-97 %] 92 % (09/24 0548)  Intake/Output from previous day: 09/23 0701 - 09/24 0700 In: 1240 [P.O.:240; I.V.:1000] Out: 5000 [Urine:4950] Intake/Output this shift: Total I/O In: -  Out: 450 [Urine:450] Recent Labs  Lab 05/13/19 1426 05/16/19 0621  WBC 6.5 14.3*  HGB 11.3* 11.1*  HCT 36.4 35.8*  PLT 264 253     Recent Labs  Lab 05/13/19 1426  NA 138  K 4.2  CL 106  CO2 23  BUN 12  CREATININE 0.94  CALCIUM 9.2  GLUCOSE 123*    EXAM: General: alert, cooperative and no distress Resp: clear to auscultation bilaterally Cardio: regular rate and rhythm, S1, S2 normal, no murmur, click, rub or gallop GI: bowel sounds present, soft and non-tender;  mons incisions with good wound edge approximation and no evidence of infection. Extremities: Homans sign is negative, no sign of DVT and no calf tenderness. Vaginal Bleeding: scant blood on perineal pad.   Assessment: s/p Procedure(s): ANTERIOR (CYSTOCELE) AND POSTERIOR REPAIR (RECTOCELE) TRANSVAGINAL TAPE (TVT) PROCEDURE CYSTOSCOPY: stable, progressing well and tolerating diet  Plan: Discharge home  LOS: 0 days    Earnstine Regal, PA-C 05/16/2019 8:18 AM

## 2019-05-17 ENCOUNTER — Encounter (HOSPITAL_BASED_OUTPATIENT_CLINIC_OR_DEPARTMENT_OTHER): Payer: Self-pay | Admitting: Obstetrics and Gynecology

## 2019-05-24 ENCOUNTER — Ambulatory Visit: Payer: PPO | Admitting: Podiatry

## 2019-05-31 DIAGNOSIS — J3089 Other allergic rhinitis: Secondary | ICD-10-CM | POA: Diagnosis not present

## 2019-05-31 DIAGNOSIS — J3081 Allergic rhinitis due to animal (cat) (dog) hair and dander: Secondary | ICD-10-CM | POA: Diagnosis not present

## 2019-05-31 DIAGNOSIS — J301 Allergic rhinitis due to pollen: Secondary | ICD-10-CM | POA: Diagnosis not present

## 2019-06-09 ENCOUNTER — Other Ambulatory Visit: Payer: Self-pay | Admitting: Nurse Practitioner

## 2019-06-14 DIAGNOSIS — J3081 Allergic rhinitis due to animal (cat) (dog) hair and dander: Secondary | ICD-10-CM | POA: Diagnosis not present

## 2019-06-14 DIAGNOSIS — J301 Allergic rhinitis due to pollen: Secondary | ICD-10-CM | POA: Diagnosis not present

## 2019-06-14 DIAGNOSIS — J3089 Other allergic rhinitis: Secondary | ICD-10-CM | POA: Diagnosis not present

## 2019-06-20 ENCOUNTER — Encounter: Payer: Self-pay | Admitting: Gastroenterology

## 2019-06-20 NOTE — Telephone Encounter (Signed)
Direct colon scheduled on 12/16 at 9:30am at [facility].

## 2019-06-24 ENCOUNTER — Encounter: Payer: Self-pay | Admitting: *Deleted

## 2019-06-24 ENCOUNTER — Other Ambulatory Visit: Payer: Self-pay | Admitting: *Deleted

## 2019-06-24 DIAGNOSIS — Z1159 Encounter for screening for other viral diseases: Secondary | ICD-10-CM

## 2019-06-27 DIAGNOSIS — J301 Allergic rhinitis due to pollen: Secondary | ICD-10-CM | POA: Diagnosis not present

## 2019-06-27 DIAGNOSIS — J3081 Allergic rhinitis due to animal (cat) (dog) hair and dander: Secondary | ICD-10-CM | POA: Diagnosis not present

## 2019-06-27 DIAGNOSIS — J3089 Other allergic rhinitis: Secondary | ICD-10-CM | POA: Diagnosis not present

## 2019-07-07 ENCOUNTER — Other Ambulatory Visit: Payer: Self-pay | Admitting: Nurse Practitioner

## 2019-07-07 DIAGNOSIS — F4322 Adjustment disorder with anxiety: Secondary | ICD-10-CM

## 2019-07-09 ENCOUNTER — Other Ambulatory Visit: Payer: Self-pay | Admitting: Nurse Practitioner

## 2019-07-12 DIAGNOSIS — R3915 Urgency of urination: Secondary | ICD-10-CM | POA: Insufficient documentation

## 2019-07-12 DIAGNOSIS — J301 Allergic rhinitis due to pollen: Secondary | ICD-10-CM | POA: Diagnosis not present

## 2019-07-12 DIAGNOSIS — R87619 Unspecified abnormal cytological findings in specimens from cervix uteri: Secondary | ICD-10-CM | POA: Insufficient documentation

## 2019-07-12 DIAGNOSIS — J3081 Allergic rhinitis due to animal (cat) (dog) hair and dander: Secondary | ICD-10-CM | POA: Diagnosis not present

## 2019-07-12 DIAGNOSIS — J3089 Other allergic rhinitis: Secondary | ICD-10-CM | POA: Diagnosis not present

## 2019-07-22 ENCOUNTER — Encounter: Payer: Self-pay | Admitting: Gastroenterology

## 2019-07-24 ENCOUNTER — Telehealth: Payer: Self-pay | Admitting: *Deleted

## 2019-07-24 ENCOUNTER — Ambulatory Visit: Payer: PPO

## 2019-07-24 ENCOUNTER — Encounter: Payer: Self-pay | Admitting: Gastroenterology

## 2019-07-24 DIAGNOSIS — J3089 Other allergic rhinitis: Secondary | ICD-10-CM | POA: Diagnosis not present

## 2019-07-24 DIAGNOSIS — J301 Allergic rhinitis due to pollen: Secondary | ICD-10-CM | POA: Diagnosis not present

## 2019-07-24 DIAGNOSIS — J3081 Allergic rhinitis due to animal (cat) (dog) hair and dander: Secondary | ICD-10-CM | POA: Diagnosis not present

## 2019-07-24 NOTE — Telephone Encounter (Signed)
On arrival for Previsit, the patient stated she would prefer to reschedule her procedure for the end of January related to present medical expenses and recent surgeries.( in last 2-3 months) Patient denies any GI symptoms. Rescheduled for 09/19/2019(Thursday) at 1330 with Dr Loletha Carrow Previsit rescheduled for Monday, 09/01/2018 at 1100 Patient will stop by 3rd floor to pick up appointment letter.

## 2019-07-26 DIAGNOSIS — J3081 Allergic rhinitis due to animal (cat) (dog) hair and dander: Secondary | ICD-10-CM | POA: Diagnosis not present

## 2019-07-26 DIAGNOSIS — J301 Allergic rhinitis due to pollen: Secondary | ICD-10-CM | POA: Diagnosis not present

## 2019-07-26 DIAGNOSIS — J3089 Other allergic rhinitis: Secondary | ICD-10-CM | POA: Diagnosis not present

## 2019-08-02 ENCOUNTER — Ambulatory Visit: Payer: PPO | Admitting: Nurse Practitioner

## 2019-08-07 ENCOUNTER — Encounter: Payer: PPO | Admitting: Gastroenterology

## 2019-08-07 ENCOUNTER — Other Ambulatory Visit: Payer: Self-pay | Admitting: Nurse Practitioner

## 2019-08-07 DIAGNOSIS — F4322 Adjustment disorder with anxiety: Secondary | ICD-10-CM

## 2019-08-09 DIAGNOSIS — J3089 Other allergic rhinitis: Secondary | ICD-10-CM | POA: Diagnosis not present

## 2019-08-09 DIAGNOSIS — J301 Allergic rhinitis due to pollen: Secondary | ICD-10-CM | POA: Diagnosis not present

## 2019-08-09 DIAGNOSIS — J3081 Allergic rhinitis due to animal (cat) (dog) hair and dander: Secondary | ICD-10-CM | POA: Diagnosis not present

## 2019-08-14 ENCOUNTER — Encounter: Payer: Self-pay | Admitting: Podiatry

## 2019-08-14 ENCOUNTER — Other Ambulatory Visit: Payer: Self-pay

## 2019-08-14 ENCOUNTER — Ambulatory Visit: Payer: PPO | Admitting: Podiatry

## 2019-08-14 DIAGNOSIS — M79674 Pain in right toe(s): Secondary | ICD-10-CM | POA: Diagnosis not present

## 2019-08-14 DIAGNOSIS — B351 Tinea unguium: Secondary | ICD-10-CM | POA: Diagnosis not present

## 2019-08-14 DIAGNOSIS — M79675 Pain in left toe(s): Secondary | ICD-10-CM

## 2019-08-14 NOTE — Progress Notes (Signed)
Complaint:  Visit Type: Patient returns to my office for continued preventative foot care services. Complaint: Patient states" my nails have grown long and thick and become painful to walk and wear shoes" . The patient presents for preventative foot care services. No changes to ROS.  Patient was not seen due to hospitalization last visit.  Podiatric Exam: Vascular: dorsalis pedis and posterior tibial pulses are palpable bilateral. Capillary return is immediate. Temperature gradient is WNL. Skin turgor WNL  Sensorium: Normal Semmes Weinstein monofilament test. Normal tactile sensation bilaterally. Nail Exam: Pt has thick disfigured discolored nails with subungual debris noted bilateral entire nail hallux through fifth toenails Ulcer Exam: There is no evidence of ulcer or pre-ulcerative changes or infection. Orthopedic Exam: Muscle tone and strength are WNL. No limitations in general ROM. No crepitus or effusions noted. Foot type and digits show no abnormalities. HAV with hammer toe  B/L. Skin: No Porokeratosis. No infection or ulcers  Diagnosis:  Onychomycosis, , Pain in right toe, pain in left toes  Treatment & Plan Procedures and Treatment: Consent by patient was obtained for treatment procedures.   Debridement of mycotic and hypertrophic toenails, 1 through 5 bilateral and clearing of subungual debris. No ulceration, no infection noted.  Return Visit-Office Procedure: Patient instructed to return to the office for a follow up visit 3 months for continued evaluation and treatment.    Gardiner Barefoot DPM

## 2019-08-19 ENCOUNTER — Ambulatory Visit (INDEPENDENT_AMBULATORY_CARE_PROVIDER_SITE_OTHER): Payer: PPO | Admitting: Nurse Practitioner

## 2019-08-19 ENCOUNTER — Encounter: Payer: Self-pay | Admitting: Nurse Practitioner

## 2019-08-19 ENCOUNTER — Other Ambulatory Visit: Payer: Self-pay

## 2019-08-19 DIAGNOSIS — K219 Gastro-esophageal reflux disease without esophagitis: Secondary | ICD-10-CM

## 2019-08-19 DIAGNOSIS — K8020 Calculus of gallbladder without cholecystitis without obstruction: Secondary | ICD-10-CM

## 2019-08-19 DIAGNOSIS — I10 Essential (primary) hypertension: Secondary | ICD-10-CM

## 2019-08-19 DIAGNOSIS — Z1211 Encounter for screening for malignant neoplasm of colon: Secondary | ICD-10-CM

## 2019-08-19 DIAGNOSIS — E785 Hyperlipidemia, unspecified: Secondary | ICD-10-CM

## 2019-08-19 DIAGNOSIS — D509 Iron deficiency anemia, unspecified: Secondary | ICD-10-CM | POA: Diagnosis not present

## 2019-08-19 DIAGNOSIS — R05 Cough: Secondary | ICD-10-CM

## 2019-08-19 DIAGNOSIS — R053 Chronic cough: Secondary | ICD-10-CM

## 2019-08-19 NOTE — Progress Notes (Signed)
This service is provided via telemedicine  No vital signs collected/recorded due to the encounter was a telemedicine visit.   Location of patient (ex: home, work): Home  Patient consents to a telephone visit: Yes  Location of the provider (ex: office, home):  [facility], Office   Name of any referring provider:  N/A  Names of all persons participating in the telemedicine service and their role in the encounter:  S.Chrae B/CMA, Sherrie Mustache, NP, and Patient   Time spent on call:  6 min with medical assistant      Careteam: Patient Care Team: Lauree Chandler, NP as PCP - General (Geriatric Medicine) Monna Fam, MD as Consulting Physician (Ophthalmology)  Advanced Directive information Does Patient Have a Medical Advance Directive?: No, Does patient want to make changes to medical advance directive?: Yes (MAU/Ambulatory/Procedural Areas - Information given)(Paperwork given at previous appointment)  Allergies  Allergen Reactions  . Demerol Hives    All over the body   . Lisinopril-Hydrochlorothiazide Hives    All over the body  . Penicillins Hives    Has patient had a PCN reaction causing immediate rash, facial/tongue/throat swelling, SOB or lightheadedness with hypotension: No Has patient had a PCN reaction causing severe rash involving mucus membranes or skin necrosis: No Has patient had a PCN reaction that required hospitalization: No Has patient had a PCN reaction occurring within the last 10 years: No If all of the above answers are "NO", then may proceed with Cephalosporin use.   All over the body    Chief Complaint  Patient presents with  . Medical Management of Chronic Issues    6 month follow-up. Patient c/o chroinc cough. Telehealth   . Quality Metric Gaps    Discuss colonoscopy recommendation   . Immunizations    Will get shingrix at [facility] in the near future      HPI: Patient is a 73 y.o. female via tele visit of 6 month follow up.     Had AWV in September- planning on getting Shingrix shot next month.   Allergic rhinitis- seeing allergist routinely, controlled on injections.   Gallstones noted on [place] in August- not having any symptoms of this.   Hypertension- 138/82 on last reading. Continues on losartan 75 mg daily with procardia, lasix 40 mg daily and KCL supplement.   Chronic cough- basically the same. Some days are better. Throat irriatates easily.  Pt has had allergies and cough for a long has she remembers. has been seen by allergies, pulmonary and ENT due to chronic cough. Has been given exercises and intervention. Now she is on tussionex which helps suppress cough but does not like taking it. Taking it every night which does help her but not resolved it. She has had to cut back on it due to insurance not paying for it.now she takes maybe once a week when she goes somewhere which is not often due to COVID  Hyperlipidemia- pravastatin 40 mg daily   Anemia- on b12 supplements.   GERD- without symptoms on protonix 40 mg BID.   Colonoscopy scheduled for January.   Review of Systems:  Review of Systems  Constitutional: Negative for chills, fever, malaise/fatigue and weight loss.  Respiratory: Positive for cough (chronic ). Negative for sputum production and shortness of breath.   Cardiovascular: Negative for chest pain, palpitations and leg swelling.  Gastrointestinal: Negative for abdominal pain, constipation, diarrhea and heartburn.  Genitourinary: Negative for dysuria, frequency and urgency.  Musculoskeletal: Negative for back  pain, falls, joint pain and myalgias.  Skin: Negative.   Neurological: Negative for dizziness and headaches.  Psychiatric/Behavioral: Negative for depression and memory loss. The patient does not have insomnia.     Past Medical History:  Diagnosis Date  . Abnormal Pap smear    Over 63yrs ago  . ALKALINE PHOSPHATASE, ELEVATED 07/01/2009   Qualifier: Diagnosis of  By: [name] MD,  [name]   . Allergic rhinitis   . Anemia   . Arthritis   . Asthma   . Cough    occasional; 9-21: reports as ongoing cough , managing with cough medicine and scheduled and prn inhalers, reports mucus is clear and scant    . Cystocele 11/05/08  . Diverticulosis of colon   . DJD (degenerative joint disease)    no per pt  . Elevated alkaline phosphatase level   . GERD (gastroesophageal reflux disease)   . H/O urinary incontinence 11/06/2008  . H/O varicella   . Hard measles   . Hx of colonic polyps   . Hypercholesterolemia   . Hypertension   . Leg swelling    occasional  . 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 Overweight(278.02)   . Rectocele 11/06/2008   Large  . Sebaceous cyst    on back  . Sinusitis   . Venous insufficiency   . Wheezing    occasional  . Yeast infection    Past Surgical History:  Procedure Laterality Date  . ABDOMINAL HYSTERECTOMY  1988  . ANTERIOR AND POSTERIOR REPAIR  11/05/08  . ANTERIOR AND POSTERIOR REPAIR N/A 05/15/2019   Procedure: ANTERIOR (CYSTOCELE) AND POSTERIOR REPAIR (RECTOCELE);  Surgeon: Everett Graff, MD;  Location: [facility];  Service: Gynecology;  Laterality: N/A;  . APOGEE / PERIGEE REPAIR  10/2008   Dr. Octavio Manns  . BLADDER SUSPENSION N/A 05/15/2019   Procedure: TRANSVAGINAL TAPE (TVT) PROCEDURE;  Surgeon: Everett Graff, MD;  Location: [facility];  Service: Gynecology;  Laterality: N/A;  . bladder tack  2011  . BREAST SURGERY  1978   cyst removed from both removed  . cataracts   approx 10 years   Dr Johnnye Sima , ctaract with lens placement   . COLONOSCOPY    . CYSTOSCOPY N/A 05/15/2019   Procedure: CYSTOSCOPY;  Surgeon: Everett Graff, MD;  Location: [facility];  Service: Gynecology;  Laterality: N/A;  . ESOPHAGOGASTRODUODENOSCOPY     Social History:   reports that she has never smoked. She has never used smokeless tobacco. She reports current alcohol use of about 2.0  standard drinks of alcohol per week. She reports that she does not use drugs.  Family History  Problem Relation Age of Onset  . Aneurysm Mother   . Lung cancer Father   . Hypertension Daughter   . Hypertension Daughter   . Colon cancer Neg Hx   . Esophageal cancer Neg Hx   . Stomach cancer Neg Hx   . Rectal cancer Neg Hx   . Colon polyps Neg Hx     Medications: Patient's Medications  New Prescriptions   No medications on file  Previous Medications   ALBUTEROL (PROVENTIL HFA;VENTOLIN HFA) 108 (90 BASE) MCG/ACT INHALER    Inhale 2 puffs into the lungs every 6 (six) hours as needed for wheezing or shortness of breath.   BUDESONIDE-FORMOTEROL (SYMBICORT) 160-4.5 MCG/ACT INHALER    Inhale 2 puffs into the lungs 2 (two) times daily.   CALCIUM CARB-CHOLECALCIFEROL (CALCIUM 600 + D PO)  Take 1 tablet by mouth 2 (two) times daily.    CETIRIZINE (ZYRTEC) 10 MG TABLET    Take 10 mg by mouth daily.   CHLORPHENIRAMINE-HYDROCODONE (TUSSIONEX PENNKINETIC ER) 10-8 MG/5ML SUER    Take 5 mLs by mouth every 12 (twelve) hours as needed for cough.   DULOXETINE (CYMBALTA) 60 MG CAPSULE    TAKE 1 CAPSULE BY MOUTH EVERY DAY   EPINEPHRINE 0.3 MG/0.3 ML IJ SOAJ INJECTION    epinephrine 0.3 mg/0.3 mL injection, auto-injector   FLUTICASONE (FLONASE) 50 MCG/ACT NASAL SPRAY    2 sprays daily.   FUROSEMIDE (LASIX) 40 MG TABLET    TAKE 1 TABLET BY MOUTH EVERY DAY   IBUPROFEN (ADVIL) 600 MG TABLET    take 1 tablet po pc every 6 hours for 5 days then as needed for post operative pain   LEVOCETIRIZINE (XYZAL) 5 MG TABLET    Take 1 tablet (5 mg total) by mouth every evening.   LOSARTAN (COZAAR) 25 MG TABLET    TAKE 3 TABLETS DAILY   MONTELUKAST (SINGULAIR) 10 MG TABLET    Take 10 mg by mouth at bedtime.     MULTIPLE VITAMINS-MINERALS (MACULAR VITAMIN BENEFIT PO)    Take 1 capsule by mouth 2 (two) times daily.     NEOMYCIN-POLYMYXIN B-DEXAMETHASONE (MAXITROL) 3.5-10000-0.1 OINT    Place 3.5 application into the left  eye as needed.   NIFEDIPINE (PROCARDIA XL/NIFEDICAL XL) 60 MG 24 HR TABLET    Take 1 tablet (60 mg total) by mouth daily.   NON FORMULARY    Allergy shots once every other week   OMEGA-3 FATTY ACIDS (FISH OIL PO)    Take by mouth daily.   OXYCODONE-ACETAMINOPHEN (PERCOCET) 5-325 MG TABLET    take 1 tablet po every 6 hours as needed for breakthrough post operative pain   PANTOPRAZOLE (PROTONIX) 40 MG TABLET    Take 1 tablet (40 mg total) by mouth 2 (two) times daily. For stomach   POTASSIUM CHLORIDE SA (KLOR-CON M20) 20 MEQ TABLET    TAKE TWO TABLETS BY MOUTH ONCE DAILY FOR POTASSIUM SUPPLEMENT   PRAVASTATIN (PRAVACHOL) 40 MG TABLET    Take 1 tablet (40 mg total) by mouth daily.   PSYLLIUM (METAMUCIL) 58.6 % PACKET    Take 1 packet by mouth daily as needed (constipation).   SOLIFENACIN (VESICARE) 5 MG TABLET    Take 5 mg by mouth daily.   SPIRIVA HANDIHALER 18 MCG INHALATION CAPSULE    Place 18 mcg into inhaler and inhale daily.    SYSTANE ULTRA 0.4-0.3 % SOLN    SMARTSIG:1 Drop(s) In Eye(s) As Needed   VITAMIN B-12 (CYANOCOBALAMIN) 1000 MCG TABLET    Take 1,000 mcg by mouth daily.  Modified Medications   No medications on file  Discontinued Medications   No medications on file    Physical Exam:  There were no vitals filed for this visit. There is no height or weight on file to calculate BMI. Wt Readings from Last 3 Encounters:  05/15/19 226 lb 6.4 oz (102.7 kg)  03/20/19 221 lb (100.2 kg)  07/17/18 219 lb (99.3 kg)    Labs reviewed: Basic Metabolic Panel: Recent Labs    11/09/18 0813 03/15/19 0840 05/13/19 1426  NA 141 140 138  K 4.0 3.8 4.2  CL 107 108 106  CO2 24 27 23   GLUCOSE 100* 107* 123*  BUN 11 13 12   CREATININE 0.87 0.86 0.94  CALCIUM 9.4 9.5 9.2   Liver  Function Tests: Recent Labs    11/09/18 0813 03/15/19 0840  AST 24 18  ALT 16 13  BILITOT 0.5 0.5  PROT 7.5 7.1   No results for input(s): LIPASE, AMYLASE in the last 8760 hours. No results for  input(s): AMMONIA in the last 8760 hours. CBC: Recent Labs    11/09/18 0813 03/15/19 0840 05/13/19 1426 05/16/19 0621  WBC 6.0 5.8 6.5 14.3*  NEUTROABS 2,298 2,714  --   --   HGB 11.5* 11.4* 11.3* 11.1*  HCT 34.4* 34.9* 36.4 35.8*  MCV 81.1 81.2 86.3 86.3  PLT 279 264 264 253   Lipid Panel: Recent Labs    11/09/18 0813  CHOL 155  HDL 56  LDLCALC 84  TRIG 70  CHOLHDL 2.8   TSH: No results for input(s): TSH in the last 8760 hours. A1C: Lab Results  Component Value Date   HGBA1C 5.6 05/26/2014     Assessment/Plan 1. Hyperlipidemia LDL goal <130 -continues on pravastatin 40 mg daily, encouraged diet modification with medication compliance. - Lipid panel; Future - COMPLETE METABOLIC PANEL WITH GFR; Future  2. Gallstones -noted on [place] in august, pt remains asymptomatic  3. Essential hypertension -controlled on nifedipine, losartan, lasix with potassium supplement. - COMPLETE METABOLIC PANEL WITH GFR; Future - CBC with Differential/Platelet; Future  4. Gastroesophageal reflux disease without esophagitis Controlled on protonix 40 mg BID with dietary modifications.  5. Chronic cough -unchanged, continues with lifestyle modifications, uses tussionex rarely if needed.   6. Colon cancer screening Has appt scheduled for colonoscopy.  7. Microcytic anemia -continues on vit b12 supplement - CBC with Differential/Platelet; Future  Next appt: for come in for fasting labs, follow Xhaiden Coombs K. Harle Battiest   Virtual Visit via Telephone Note  I connected with pt on 08/19/19 at 11:00 AM EST by telephone and verified that I am speaking with the correct person using two identifiers.  Location: Patient: home Provider: office    I discussed the limitations, risks, security and privacy concerns of performing an evaluation and management service by telephone and the availability of in person appointments. I also discussed with the patient that there may be a patient  responsible charge related to this service. The patient expressed understanding and agreed to proceed.   I discussed the assessment and treatment plan with the patient. The patient was provided an opportunity to ask questions and all were answered. The patient agreed with the plan and demonstrated an understanding of the instructions.   The patient was advised to call back or seek an in-person evaluation if the symptoms worsen or if the condition fails to improve as anticipated.  I provided 18 minutes of non-face-to-face time during this encounter.  Carlos American. Harle Battiest Avs printed and mailed     [place] [phone]

## 2019-08-22 DIAGNOSIS — J301 Allergic rhinitis due to pollen: Secondary | ICD-10-CM | POA: Diagnosis not present

## 2019-08-22 DIAGNOSIS — J3081 Allergic rhinitis due to animal (cat) (dog) hair and dander: Secondary | ICD-10-CM | POA: Diagnosis not present

## 2019-08-22 DIAGNOSIS — J3089 Other allergic rhinitis: Secondary | ICD-10-CM | POA: Diagnosis not present

## 2019-08-26 ENCOUNTER — Other Ambulatory Visit: Payer: Self-pay

## 2019-08-26 ENCOUNTER — Other Ambulatory Visit: Payer: PPO

## 2019-08-26 DIAGNOSIS — I10 Essential (primary) hypertension: Secondary | ICD-10-CM | POA: Diagnosis not present

## 2019-08-26 DIAGNOSIS — E785 Hyperlipidemia, unspecified: Secondary | ICD-10-CM

## 2019-08-26 DIAGNOSIS — D509 Iron deficiency anemia, unspecified: Secondary | ICD-10-CM

## 2019-08-26 LAB — COMPLETE METABOLIC PANEL WITH GFR
AG Ratio: 1.1 (calc) (ref 1.0–2.5)
ALT: 13 U/L (ref 6–29)
AST: 20 U/L (ref 10–35)
Albumin: 3.9 g/dL (ref 3.6–5.1)
Alkaline phosphatase (APISO): 168 U/L — ABNORMAL HIGH (ref 37–153)
BUN: 16 mg/dL (ref 7–25)
CO2: 28 mmol/L (ref 20–32)
Calcium: 9.6 mg/dL (ref 8.6–10.4)
Chloride: 107 mmol/L (ref 98–110)
Creat: 0.83 mg/dL (ref 0.60–0.93)
GFR, Est African American: 80 mL/min/{1.73_m2} (ref >=60)
GFR, Est Non African American: 69 mL/min/{1.73_m2} (ref >=60)
Globulin: 3.5 g/dL (calc) (ref 1.9–3.7)
Glucose, Bld: 101 mg/dL — ABNORMAL HIGH (ref 65–99)
Potassium: 3.7 mmol/L (ref 3.5–5.3)
Sodium: 143 mmol/L (ref 135–146)
Total Bilirubin: 0.4 mg/dL (ref 0.2–1.2)
Total Protein: 7.4 g/dL (ref 6.1–8.1)

## 2019-08-26 LAB — CBC WITH DIFFERENTIAL/PLATELET
Absolute Monocytes: 464 cells/uL (ref 200–950)
Basophils Absolute: 43 cells/uL (ref 0–200)
Basophils Relative: 0.7 %
Eosinophils Absolute: 183 cells/uL (ref 15–500)
Eosinophils Relative: 3 %
HCT: 35.9 % (ref 35.0–45.0)
Hemoglobin: 11.4 g/dL — ABNORMAL LOW (ref 11.7–15.5)
Lymphs Abs: 2361 cells/uL (ref 850–3900)
MCH: 26.3 pg — ABNORMAL LOW (ref 27.0–33.0)
MCHC: 31.8 g/dL — ABNORMAL LOW (ref 32.0–36.0)
MCV: 82.7 fL (ref 80.0–100.0)
MPV: 11.5 fL (ref 7.5–12.5)
Monocytes Relative: 7.6 %
Neutro Abs: 3050 cells/uL (ref 1500–7800)
Neutrophils Relative %: 50 %
Platelets: 275 10*3/uL (ref 140–400)
RBC: 4.34 10*6/uL (ref 3.80–5.10)
RDW: 13.8 % (ref 11.0–15.0)
Total Lymphocyte: 38.7 %
WBC: 6.1 10*3/uL (ref 3.8–10.8)

## 2019-08-26 LAB — LIPID PANEL
Cholesterol: 157 mg/dL (ref <200)
HDL: 57 mg/dL (ref >=50)
LDL Cholesterol (Calc): 84 mg/dL (calc)
Non-HDL Cholesterol (Calc): 100 mg/dL (calc) (ref <130)
Total CHOL/HDL Ratio: 2.8 (calc) (ref <5.0)
Triglycerides: 70 mg/dL (ref <150)

## 2019-08-27 ENCOUNTER — Ambulatory Visit: Payer: PPO | Admitting: Internal Medicine

## 2019-08-27 DIAGNOSIS — H353132 Nonexudative age-related macular degeneration, bilateral, intermediate dry stage: Secondary | ICD-10-CM | POA: Diagnosis not present

## 2019-08-27 DIAGNOSIS — H35033 Hypertensive retinopathy, bilateral: Secondary | ICD-10-CM | POA: Diagnosis not present

## 2019-08-27 DIAGNOSIS — H35373 Puckering of macula, bilateral: Secondary | ICD-10-CM | POA: Diagnosis not present

## 2019-08-27 DIAGNOSIS — H40023 Open angle with borderline findings, high risk, bilateral: Secondary | ICD-10-CM | POA: Diagnosis not present

## 2019-08-31 ENCOUNTER — Other Ambulatory Visit: Payer: Self-pay | Admitting: Nurse Practitioner

## 2019-08-31 DIAGNOSIS — F4322 Adjustment disorder with anxiety: Secondary | ICD-10-CM

## 2019-09-05 ENCOUNTER — Other Ambulatory Visit: Payer: Self-pay

## 2019-09-05 ENCOUNTER — Ambulatory Visit (AMBULATORY_SURGERY_CENTER): Payer: Self-pay | Admitting: *Deleted

## 2019-09-05 VITALS — Temp 97.9°F | Ht 63.0 in | Wt 223.0 lb

## 2019-09-05 DIAGNOSIS — J3081 Allergic rhinitis due to animal (cat) (dog) hair and dander: Secondary | ICD-10-CM | POA: Diagnosis not present

## 2019-09-05 DIAGNOSIS — Z86010 Personal history of colonic polyps: Secondary | ICD-10-CM

## 2019-09-05 DIAGNOSIS — J301 Allergic rhinitis due to pollen: Secondary | ICD-10-CM | POA: Diagnosis not present

## 2019-09-05 DIAGNOSIS — J3089 Other allergic rhinitis: Secondary | ICD-10-CM | POA: Diagnosis not present

## 2019-09-05 DIAGNOSIS — Z01818 Encounter for other preprocedural examination: Secondary | ICD-10-CM

## 2019-09-05 NOTE — Progress Notes (Signed)

## 2019-09-05 NOTE — Progress Notes (Signed)
Samples of Plenvu were given to the patient, quantity 1, Lot Number [id number] exp 11/2019

## 2019-09-10 ENCOUNTER — Encounter: Payer: Self-pay | Admitting: Gastroenterology

## 2019-09-13 DIAGNOSIS — J3089 Other allergic rhinitis: Secondary | ICD-10-CM | POA: Diagnosis not present

## 2019-09-13 DIAGNOSIS — J301 Allergic rhinitis due to pollen: Secondary | ICD-10-CM | POA: Diagnosis not present

## 2019-09-13 DIAGNOSIS — J3081 Allergic rhinitis due to animal (cat) (dog) hair and dander: Secondary | ICD-10-CM | POA: Diagnosis not present

## 2019-09-17 ENCOUNTER — Ambulatory Visit (INDEPENDENT_AMBULATORY_CARE_PROVIDER_SITE_OTHER): Payer: PPO

## 2019-09-17 ENCOUNTER — Other Ambulatory Visit: Payer: Self-pay | Admitting: Gastroenterology

## 2019-09-17 DIAGNOSIS — J3081 Allergic rhinitis due to animal (cat) (dog) hair and dander: Secondary | ICD-10-CM | POA: Diagnosis not present

## 2019-09-17 DIAGNOSIS — Z1159 Encounter for screening for other viral diseases: Secondary | ICD-10-CM

## 2019-09-17 DIAGNOSIS — J301 Allergic rhinitis due to pollen: Secondary | ICD-10-CM | POA: Diagnosis not present

## 2019-09-17 DIAGNOSIS — J3089 Other allergic rhinitis: Secondary | ICD-10-CM | POA: Diagnosis not present

## 2019-09-18 LAB — SARS CORONAVIRUS 2 (TAT 6-24 HRS): SARS Coronavirus 2: NEGATIVE

## 2019-09-19 ENCOUNTER — Encounter: Payer: Self-pay | Admitting: Gastroenterology

## 2019-09-19 ENCOUNTER — Other Ambulatory Visit: Payer: Self-pay

## 2019-09-19 ENCOUNTER — Encounter: Payer: PPO | Admitting: Gastroenterology

## 2019-09-19 ENCOUNTER — Ambulatory Visit (AMBULATORY_SURGERY_CENTER): Payer: PPO | Admitting: Gastroenterology

## 2019-09-19 VITALS — BP 123/73 | HR 92 | Temp 98.2°F | Resp 22 | Ht 63.0 in | Wt 223.0 lb

## 2019-09-19 DIAGNOSIS — D122 Benign neoplasm of ascending colon: Secondary | ICD-10-CM | POA: Diagnosis not present

## 2019-09-19 DIAGNOSIS — Z86010 Personal history of colonic polyps: Secondary | ICD-10-CM | POA: Diagnosis not present

## 2019-09-19 DIAGNOSIS — I10 Essential (primary) hypertension: Secondary | ICD-10-CM | POA: Diagnosis not present

## 2019-09-19 DIAGNOSIS — D123 Benign neoplasm of transverse colon: Secondary | ICD-10-CM

## 2019-09-19 DIAGNOSIS — Z1211 Encounter for screening for malignant neoplasm of colon: Secondary | ICD-10-CM | POA: Diagnosis not present

## 2019-09-19 MED ORDER — SODIUM CHLORIDE 0.9 % IV SOLN: 500.0000 mL | Freq: Once | INTRAVENOUS | Status: DC

## 2019-09-19 NOTE — Op Note (Signed)
[place] Patient Name: Wendy Figueroa Procedure Date: 09/19/2019 1:32 PM MRN: [id number] Endoscopist: Mallie Mussel L. Loletha Carrow , MD Age: 74 Referring MD:  Date of Birth: [birth year] Gender: Female Account #: [id number] Procedure:                Colonoscopy Indications:              Surveillance: Personal history of adenomatous                            polyps on last colonoscopy > 5 years ago Medicines:                Monitored Anesthesia Care Procedure:                Pre-Anesthesia Assessment:                           - Prior to the procedure, a History and Physical                            was performed, and patient medications and                            allergies were reviewed. The patient's tolerance of                            previous anesthesia was also reviewed. The risks                            and benefits of the procedure and the sedation                            options and risks were discussed with the patient.                            All questions were answered, and informed consent                            was obtained. Prior Anticoagulants: The patient has                            taken no previous anticoagulant or antiplatelet                            agents. ASA Grade Assessment: II - A patient with                            mild systemic disease. After reviewing the risks                            and benefits, the patient was deemed in                            satisfactory condition to undergo the procedure.  After obtaining informed consent, the colonoscope                            was passed under direct vision. Throughout the                            procedure, the patient's blood pressure, pulse, and                            oxygen saturations were monitored continuously. The                            Colonoscope was introduced through the anus and                            advanced to the the  cecum, identified by                            appendiceal orifice and ileocecal valve. The                            colonoscopy was performed with difficulty due to a                            redundant colon, significant looping and the                            patient's body habitus. Successful completion of                            the procedure was aided by using manual pressure.                            The patient tolerated the procedure fairly well.                            The quality of the bowel preparation was good. The                            ileocecal valve, appendiceal orifice, and rectum                            were photographed. The quality of the bowel                            preparation was evaluated using the BBPS [facility]                            Bowel Preparation Scale) with scores of: Right                            Colon = 2, Transverse Colon = 2 and Left Colon = 2.  The total BBPS score equals 6. Scope In: 1:33:43 PM Scope Out: 2:03:47 PM Scope Withdrawal Time: 0 hours 11 minutes 57 seconds  Total Procedure Duration: 0 hours 30 minutes 4 seconds  Findings:                 The perianal and digital rectal examinations were                            normal.                           Three sessile polyps were found in the proximal                            transverse colon, mid transverse colon and proximal                            ascending colon. The polyps were diminutive in                            size. These polyps were removed with a cold biopsy                            forceps. Resection and retrieval were complete.                           The exam was otherwise without abnormality on                            direct and retroflexion views. Complications:            No immediate complications. Estimated Blood Loss:     Estimated blood loss was minimal. Impression:               - Three diminutive  polyps in the proximal                            transverse colon, in the mid transverse colon and                            in the proximal ascending colon, removed with a                            cold biopsy forceps. Resected and retrieved.                           - The examination was otherwise normal on direct                            and retroflexion views. Recommendation:           - Patient has a contact number available for                            emergencies. The signs and symptoms of potential  delayed complications were discussed with the                            patient. Return to normal activities tomorrow.                            Written discharge instructions were provided to the                            patient.                           - Resume previous diet.                           - Continue present medications.                           - Await pathology results.                           - Based on current guidelines, no repeat routine                            colonoscopy neccessary due to age. Devanny Palecek L. Loletha Carrow, MD 09/19/2019 2:12:56 PM This report has been signed electronically.

## 2019-09-19 NOTE — Progress Notes (Signed)
Temp by JB and Vitals by KA

## 2019-09-19 NOTE — Progress Notes (Signed)
Called to room to assist during endoscopic procedure.  Patient ID and intended procedure confirmed with present staff. Received instructions for my participation in the procedure from the performing physician.  

## 2019-09-19 NOTE — Progress Notes (Signed)
Report given to PACU, vss 

## 2019-09-19 NOTE — Patient Instructions (Signed)
Please, read all of the handouts given to you by your recovery room nurse.  Thank-you for choosing [place] for your healthcare needs today.  YOU HAD AN ENDOSCOPIC PROCEDURE TODAY AT [facility]:   Refer to the procedure report that was given to you for any specific questions about what was found during the examination.  If the procedure report does not answer your questions, please call your gastroenterologist to clarify.  If you requested that your care partner not be given the details of your procedure findings, then the procedure report has been included in a sealed envelope for you to review at your convenience later.  YOU SHOULD EXPECT: Some feelings of bloating in the abdomen. Passage of more gas than usual.  Walking can help get rid of the air that was put into your GI tract during the procedure and reduce the bloating. If you had a lower endoscopy (such as a colonoscopy or flexible sigmoidoscopy) you may notice spotting of blood in your stool or on the toilet paper. If you underwent a bowel prep for your procedure, you may not have a normal bowel movement for a few days.  Please Note:  You might notice some irritation and congestion in your nose or some drainage.  This is from the oxygen used during your procedure.  There is no need for concern and it should clear up in a day or so.  SYMPTOMS TO REPORT IMMEDIATELY:   Following lower endoscopy (colonoscopy or flexible sigmoidoscopy):  Excessive amounts of blood in the stool  Significant tenderness or worsening of abdominal pains  Swelling of the abdomen that is new, acute  Fever of 100F or higher   For urgent or emergent issues, a gastroenterologist can be reached at any hour by calling [phone].   DIET:  We do recommend a small meal at first, but then you may proceed to your regular diet.  Drink plenty of fluids but you should avoid alcoholic beverages for 24 hours.  ACTIVITY:  You should plan to take it easy for the  rest of today and you should NOT DRIVE or use heavy machinery until tomorrow (because of the sedation medicines used during the test).    FOLLOW UP: Our staff will call the number listed on your records 48-72 hours following your procedure to check on you and address any questions or concerns that you may have regarding the information given to you following your procedure. If we do not reach you, we will leave a message.  We will attempt to reach you two times.  During this call, we will ask if you have developed any symptoms of COVID 19. If you develop any symptoms (ie: fever, flu-like symptoms, shortness of breath, cough etc.) before then, please call [phone].  If you test positive for Covid 19 in the 2 weeks post procedure, please call and report this information to [place].    If any biopsies were taken you will be contacted by phone or by letter within the next 1-3 weeks.  Please call us at [phone] if you have not heard about the biopsies in 3 weeks.    SIGNATURES/CONFIDENTIALITY: You and/or your care partner have signed paperwork which will be entered into your electronic medical record.  These signatures attest to the fact that that the information above on your After Visit Summary has been reviewed and is understood.  Full responsibility of the confidentiality of this discharge information lies with you and/or your care-partner.

## 2019-09-20 DIAGNOSIS — J301 Allergic rhinitis due to pollen: Secondary | ICD-10-CM | POA: Diagnosis not present

## 2019-09-20 DIAGNOSIS — J3081 Allergic rhinitis due to animal (cat) (dog) hair and dander: Secondary | ICD-10-CM | POA: Diagnosis not present

## 2019-09-20 DIAGNOSIS — J3089 Other allergic rhinitis: Secondary | ICD-10-CM | POA: Diagnosis not present

## 2019-09-23 ENCOUNTER — Telehealth: Payer: Self-pay

## 2019-09-23 ENCOUNTER — Telehealth: Payer: Self-pay | Admitting: *Deleted

## 2019-09-23 NOTE — Telephone Encounter (Signed)
  Follow up Call-  Call back number 09/19/2019  Post procedure Call Back phone  # [phone]  Permission to leave phone message Yes  Some recent data might be hidden     Patient questions:  Do you have a fever, pain , or abdominal swelling? No. Pain Score  0 *  Have you tolerated food without any problems? Yes.    Have you been able to return to your normal activities? Yes.    Do you have any questions about your discharge instructions: Diet   No. Medications  No. Follow up visit  No.  Do you have questions or concerns about your Care? No.  Actions: * If pain score is 4 or above: 1. No action needed, pain <4.Have you developed a fever since your procedure? no  2.   Have you had an respiratory symptoms (SOB or cough) since your procedure? no  3.   Have you tested positive for COVID 19 since your procedure no  4.   Have you had any family members/close contacts diagnosed with the COVID 19 since your procedure?  no   If yes to any of these questions please route to Joylene John, RN and Alphonsa Gin, Therapist, sports.

## 2019-09-23 NOTE — Telephone Encounter (Signed)
  Follow up Call-  Call back number 09/19/2019  Post procedure Call Back phone  # [phone]  Permission to leave phone message Yes  Some recent data might be hidden     Patient questions:  Do you have a fever, pain , or abdominal swelling? No. Pain Score  0 *  Have you tolerated food without any problems? Yes.    Have you been able to return to your normal activities? Yes.    Do you have any questions about your discharge instructions: Diet   No. Medications  No. Follow up visit  No.  Do you have questions or concerns about your Care? No.  Actions: * If pain score is 4 or above: No action needed, pain <4.  1. Have you developed a fever since your procedure? no  2.   Have you had an respiratory symptoms (SOB or cough) since your procedure? no 3.   Have you tested positive for COVID 19 since your procedure no  4.   Have you had any family members/close contacts diagnosed with the COVID 19 since your procedure?  no   If yes to any of these questions please route to Joylene John, RN and Alphonsa Gin, Therapist, sports.

## 2019-09-24 DIAGNOSIS — J3089 Other allergic rhinitis: Secondary | ICD-10-CM | POA: Diagnosis not present

## 2019-09-24 DIAGNOSIS — J301 Allergic rhinitis due to pollen: Secondary | ICD-10-CM | POA: Diagnosis not present

## 2019-09-24 DIAGNOSIS — J3081 Allergic rhinitis due to animal (cat) (dog) hair and dander: Secondary | ICD-10-CM | POA: Diagnosis not present

## 2019-09-26 ENCOUNTER — Encounter: Payer: Self-pay | Admitting: Gastroenterology

## 2019-10-01 DIAGNOSIS — J3081 Allergic rhinitis due to animal (cat) (dog) hair and dander: Secondary | ICD-10-CM | POA: Diagnosis not present

## 2019-10-01 DIAGNOSIS — J3089 Other allergic rhinitis: Secondary | ICD-10-CM | POA: Diagnosis not present

## 2019-10-01 DIAGNOSIS — J301 Allergic rhinitis due to pollen: Secondary | ICD-10-CM | POA: Diagnosis not present

## 2019-10-04 DIAGNOSIS — J3089 Other allergic rhinitis: Secondary | ICD-10-CM | POA: Diagnosis not present

## 2019-10-04 DIAGNOSIS — J301 Allergic rhinitis due to pollen: Secondary | ICD-10-CM | POA: Diagnosis not present

## 2019-10-04 DIAGNOSIS — J3081 Allergic rhinitis due to animal (cat) (dog) hair and dander: Secondary | ICD-10-CM | POA: Diagnosis not present

## 2019-10-08 DIAGNOSIS — J301 Allergic rhinitis due to pollen: Secondary | ICD-10-CM | POA: Diagnosis not present

## 2019-10-08 DIAGNOSIS — J3081 Allergic rhinitis due to animal (cat) (dog) hair and dander: Secondary | ICD-10-CM | POA: Diagnosis not present

## 2019-10-08 DIAGNOSIS — J3089 Other allergic rhinitis: Secondary | ICD-10-CM | POA: Diagnosis not present

## 2019-10-10 ENCOUNTER — Other Ambulatory Visit: Payer: Self-pay | Admitting: Nurse Practitioner

## 2019-10-11 DIAGNOSIS — J301 Allergic rhinitis due to pollen: Secondary | ICD-10-CM | POA: Diagnosis not present

## 2019-10-11 DIAGNOSIS — J3089 Other allergic rhinitis: Secondary | ICD-10-CM | POA: Diagnosis not present

## 2019-10-11 DIAGNOSIS — J3081 Allergic rhinitis due to animal (cat) (dog) hair and dander: Secondary | ICD-10-CM | POA: Diagnosis not present

## 2019-10-15 DIAGNOSIS — J3081 Allergic rhinitis due to animal (cat) (dog) hair and dander: Secondary | ICD-10-CM | POA: Diagnosis not present

## 2019-10-15 DIAGNOSIS — J301 Allergic rhinitis due to pollen: Secondary | ICD-10-CM | POA: Diagnosis not present

## 2019-10-15 DIAGNOSIS — J3089 Other allergic rhinitis: Secondary | ICD-10-CM | POA: Diagnosis not present

## 2019-11-01 DIAGNOSIS — J3089 Other allergic rhinitis: Secondary | ICD-10-CM | POA: Diagnosis not present

## 2019-11-01 DIAGNOSIS — J3081 Allergic rhinitis due to animal (cat) (dog) hair and dander: Secondary | ICD-10-CM | POA: Diagnosis not present

## 2019-11-01 DIAGNOSIS — J301 Allergic rhinitis due to pollen: Secondary | ICD-10-CM | POA: Diagnosis not present

## 2019-11-03 ENCOUNTER — Other Ambulatory Visit: Payer: Self-pay | Admitting: Nurse Practitioner

## 2019-11-04 NOTE — Telephone Encounter (Signed)
High risk or very high risk warning populated when attempting to refill medication. RX request sent to PCP for review and approval if warranted.   

## 2019-11-06 ENCOUNTER — Encounter: Payer: Self-pay | Admitting: Nurse Practitioner

## 2019-11-06 DIAGNOSIS — R921 Mammographic calcification found on diagnostic imaging of breast: Secondary | ICD-10-CM | POA: Diagnosis not present

## 2019-11-06 DIAGNOSIS — R928 Other abnormal and inconclusive findings on diagnostic imaging of breast: Secondary | ICD-10-CM | POA: Diagnosis not present

## 2019-11-13 ENCOUNTER — Ambulatory Visit: Payer: PPO | Admitting: Podiatry

## 2019-11-13 ENCOUNTER — Encounter: Payer: Self-pay | Admitting: Podiatry

## 2019-11-13 ENCOUNTER — Other Ambulatory Visit: Payer: Self-pay

## 2019-11-13 VITALS — Temp 97.2°F

## 2019-11-13 DIAGNOSIS — B351 Tinea unguium: Secondary | ICD-10-CM

## 2019-11-13 DIAGNOSIS — M79675 Pain in left toe(s): Secondary | ICD-10-CM | POA: Diagnosis not present

## 2019-11-13 DIAGNOSIS — I872 Venous insufficiency (chronic) (peripheral): Secondary | ICD-10-CM

## 2019-11-13 DIAGNOSIS — M79674 Pain in right toe(s): Secondary | ICD-10-CM

## 2019-11-13 NOTE — Progress Notes (Signed)
This patient returns to my office for at risk foot care.  This patient requires this care by a professional since this patient will be at risk due to having venous insufficiency  This patient is unable to cut nails herself since the patient cannot reach her nails.These nails are painful walking and wearing shoes.  This patient presents for at risk foot care today.  General Appearance  Alert, conversant and in no acute stress.  Vascular  Dorsalis pedis and posterior tibial  pulses are palpable  bilaterally.  Capillary return is within normal limits  bilaterally. Temperature is within normal limits  bilaterally.  Neurologic  Senn-Weinstein monofilament wire test within normal limits  bilaterally. Muscle power within normal limits bilaterally.  Nails Thick disfigured discolored nails with subungual debris  from hallux to fifth toes bilaterally. No evidence of bacterial infection or drainage bilaterally.  Orthopedic  No limitations of motion  feet .  No crepitus or effusions noted.  HAV with hammer toes  B/L.  Skin  normotropic skin with no porokeratosis noted bilaterally.  No signs of infections or ulcers noted.     Onychomycosis  Pain in right toes  Pain in left toes  Consent was obtained for treatment procedures.   Mechanical debridement of nails 1-5  bilaterally performed with a nail nipper.  Filed with dremel without incident. No infection or ulcer.     Return office visit    3 months                 Told patient to return for periodic foot care and evaluation due to potential at risk complications.   Gardiner Barefoot DPM

## 2019-11-15 DIAGNOSIS — J3089 Other allergic rhinitis: Secondary | ICD-10-CM | POA: Diagnosis not present

## 2019-11-15 DIAGNOSIS — J301 Allergic rhinitis due to pollen: Secondary | ICD-10-CM | POA: Diagnosis not present

## 2019-11-15 DIAGNOSIS — J3081 Allergic rhinitis due to animal (cat) (dog) hair and dander: Secondary | ICD-10-CM | POA: Diagnosis not present

## 2019-11-20 ENCOUNTER — Other Ambulatory Visit: Payer: Self-pay | Admitting: Nurse Practitioner

## 2019-11-20 DIAGNOSIS — I10 Essential (primary) hypertension: Secondary | ICD-10-CM

## 2019-11-29 DIAGNOSIS — J3081 Allergic rhinitis due to animal (cat) (dog) hair and dander: Secondary | ICD-10-CM | POA: Diagnosis not present

## 2019-11-29 DIAGNOSIS — J301 Allergic rhinitis due to pollen: Secondary | ICD-10-CM | POA: Diagnosis not present

## 2019-11-29 DIAGNOSIS — J3089 Other allergic rhinitis: Secondary | ICD-10-CM | POA: Diagnosis not present

## 2019-12-06 ENCOUNTER — Other Ambulatory Visit: Payer: Self-pay | Admitting: Nurse Practitioner

## 2019-12-10 ENCOUNTER — Other Ambulatory Visit: Payer: Self-pay | Admitting: *Deleted

## 2019-12-10 DIAGNOSIS — R05 Cough: Secondary | ICD-10-CM

## 2019-12-10 DIAGNOSIS — R053 Chronic cough: Secondary | ICD-10-CM

## 2019-12-10 MED ORDER — HYDROCOD POLST-CPM POLST ER 10-8 MG/5ML PO SUER: 5.0000 mL | Freq: Two times a day (BID) | ORAL | 0 refills | Status: DC | PRN

## 2019-12-10 NOTE — Telephone Encounter (Signed)
Patient called and requested refill on her Tussionex. Stated that she has chronic cough.  Pole Ojea Verified LR: 03/20/19  Pended Rx and sent to [facility] for approval.

## 2019-12-13 DIAGNOSIS — J3081 Allergic rhinitis due to animal (cat) (dog) hair and dander: Secondary | ICD-10-CM | POA: Diagnosis not present

## 2019-12-13 DIAGNOSIS — J3089 Other allergic rhinitis: Secondary | ICD-10-CM | POA: Diagnosis not present

## 2019-12-13 DIAGNOSIS — J301 Allergic rhinitis due to pollen: Secondary | ICD-10-CM | POA: Diagnosis not present

## 2019-12-27 DIAGNOSIS — J301 Allergic rhinitis due to pollen: Secondary | ICD-10-CM | POA: Diagnosis not present

## 2019-12-27 DIAGNOSIS — J3081 Allergic rhinitis due to animal (cat) (dog) hair and dander: Secondary | ICD-10-CM | POA: Diagnosis not present

## 2019-12-27 DIAGNOSIS — J3089 Other allergic rhinitis: Secondary | ICD-10-CM | POA: Diagnosis not present

## 2020-01-10 DIAGNOSIS — J3089 Other allergic rhinitis: Secondary | ICD-10-CM | POA: Diagnosis not present

## 2020-01-10 DIAGNOSIS — J301 Allergic rhinitis due to pollen: Secondary | ICD-10-CM | POA: Diagnosis not present

## 2020-01-10 DIAGNOSIS — J3081 Allergic rhinitis due to animal (cat) (dog) hair and dander: Secondary | ICD-10-CM | POA: Diagnosis not present

## 2020-01-17 ENCOUNTER — Other Ambulatory Visit: Payer: Self-pay | Admitting: Nurse Practitioner

## 2020-01-23 DIAGNOSIS — J3089 Other allergic rhinitis: Secondary | ICD-10-CM | POA: Diagnosis not present

## 2020-01-23 DIAGNOSIS — J301 Allergic rhinitis due to pollen: Secondary | ICD-10-CM | POA: Diagnosis not present

## 2020-01-23 DIAGNOSIS — J3081 Allergic rhinitis due to animal (cat) (dog) hair and dander: Secondary | ICD-10-CM | POA: Diagnosis not present

## 2020-02-07 DIAGNOSIS — J3089 Other allergic rhinitis: Secondary | ICD-10-CM | POA: Diagnosis not present

## 2020-02-07 DIAGNOSIS — J301 Allergic rhinitis due to pollen: Secondary | ICD-10-CM | POA: Diagnosis not present

## 2020-02-07 DIAGNOSIS — J3081 Allergic rhinitis due to animal (cat) (dog) hair and dander: Secondary | ICD-10-CM | POA: Diagnosis not present

## 2020-02-14 ENCOUNTER — Other Ambulatory Visit: Payer: Self-pay

## 2020-02-14 ENCOUNTER — Ambulatory Visit: Payer: PPO | Admitting: Podiatry

## 2020-02-14 ENCOUNTER — Encounter: Payer: Self-pay | Admitting: Podiatry

## 2020-02-14 DIAGNOSIS — M79674 Pain in right toe(s): Secondary | ICD-10-CM | POA: Diagnosis not present

## 2020-02-14 DIAGNOSIS — I872 Venous insufficiency (chronic) (peripheral): Secondary | ICD-10-CM | POA: Diagnosis not present

## 2020-02-14 DIAGNOSIS — B351 Tinea unguium: Secondary | ICD-10-CM

## 2020-02-14 DIAGNOSIS — M79675 Pain in left toe(s): Secondary | ICD-10-CM | POA: Diagnosis not present

## 2020-02-14 NOTE — Progress Notes (Signed)
This patient returns to my office for at risk foot care.  This patient requires this care by a professional since this patient will be at risk due to having venous insufficiency  This patient is unable to cut nails herself since the patient cannot reach her nails.These nails are painful walking and wearing shoes.  This patient presents for at risk foot care today.  General Appearance  Alert, conversant and in no acute stress.  Vascular  Dorsalis pedis and posterior tibial  pulses are palpable  bilaterally.  Capillary return is within normal limits  bilaterally. Temperature is within normal limits  bilaterally.  Neurologic  Senn-Weinstein monofilament wire test within normal limits  bilaterally. Muscle power within normal limits bilaterally.  Nails Thick disfigured discolored nails with subungual debris  from hallux to fifth toes bilaterally. No evidence of bacterial infection or drainage bilaterally.  Orthopedic  No limitations of motion  feet .  No crepitus or effusions noted.  HAV with hammer toes  B/L.  Skin  normotropic skin with no porokeratosis noted bilaterally.  No signs of infections or ulcers noted.     Onychomycosis  Pain in right toes  Pain in left toes  Consent was obtained for treatment procedures.   Mechanical debridement of nails 1-5  bilaterally performed with a nail nipper.  Filed with dremel without incident. No infection or ulcer.     Return office visit    10 weeks                Told patient to return for periodic foot care and evaluation due to potential at risk complications.   Gardiner Barefoot DPM

## 2020-02-17 ENCOUNTER — Ambulatory Visit (INDEPENDENT_AMBULATORY_CARE_PROVIDER_SITE_OTHER): Payer: PPO | Admitting: Nurse Practitioner

## 2020-02-17 ENCOUNTER — Encounter: Payer: Self-pay | Admitting: Nurse Practitioner

## 2020-02-17 ENCOUNTER — Other Ambulatory Visit: Payer: Self-pay

## 2020-02-17 VITALS — BP 128/78 | HR 83 | Temp 96.6°F | Ht 63.0 in | Wt 224.0 lb

## 2020-02-17 DIAGNOSIS — K219 Gastro-esophageal reflux disease without esophagitis: Secondary | ICD-10-CM | POA: Diagnosis not present

## 2020-02-17 DIAGNOSIS — Z6839 Body mass index (BMI) 39.0-39.9, adult: Secondary | ICD-10-CM

## 2020-02-17 DIAGNOSIS — Z7189 Other specified counseling: Secondary | ICD-10-CM

## 2020-02-17 DIAGNOSIS — R05 Cough: Secondary | ICD-10-CM | POA: Diagnosis not present

## 2020-02-17 DIAGNOSIS — E785 Hyperlipidemia, unspecified: Secondary | ICD-10-CM | POA: Diagnosis not present

## 2020-02-17 DIAGNOSIS — F4322 Adjustment disorder with anxiety: Secondary | ICD-10-CM

## 2020-02-17 DIAGNOSIS — M8580 Other specified disorders of bone density and structure, unspecified site: Secondary | ICD-10-CM

## 2020-02-17 DIAGNOSIS — J3089 Other allergic rhinitis: Secondary | ICD-10-CM | POA: Diagnosis not present

## 2020-02-17 DIAGNOSIS — J4520 Mild intermittent asthma, uncomplicated: Secondary | ICD-10-CM

## 2020-02-17 DIAGNOSIS — I10 Essential (primary) hypertension: Secondary | ICD-10-CM

## 2020-02-17 DIAGNOSIS — R053 Chronic cough: Secondary | ICD-10-CM

## 2020-02-17 NOTE — Progress Notes (Signed)
Careteam: Patient Care Team: Lauree Chandler, NP as PCP - General (Geriatric Medicine) Monna Fam, MD as Consulting Physician (Ophthalmology)  PLACE OF SERVICE:  [employer] Directive information Does Patient Have a Medical Advance Directive?: Yes, Type of Advance Directive: Living will, Does patient want to make changes to medical advance directive?: No - Patient declined  Allergies  Allergen Reactions  . Demerol Hives    All over the body   . Lisinopril-Hydrochlorothiazide Hives    All over the body  . Penicillins Hives    Has patient had a PCN reaction causing immediate rash, facial/tongue/throat swelling, SOB or lightheadedness with hypotension: No Has patient had a PCN reaction causing severe rash involving mucus membranes or skin necrosis: No Has patient had a PCN reaction that required hospitalization: No Has patient had a PCN reaction occurring within the last 10 years: No If all of the above answers are "NO", then may proceed with Cephalosporin use.   All over the body    Chief Complaint  Patient presents with  . Medical Management of Chronic Issues    6 month follow up      HPI: Patient is a 74 y.o. female for routine follow up.   Chronic cough- ongoing, bothering her husband now. Continues to have tussionex but does not like to take it.  Uses rarely if she is going somewhere. Continues on xyzal, singulair  And spiriva daily  Hyperlipidemia- continues on pravastatin 40 mg, attempting heart healthy diet.   Anemia- continues on b12 supplement.   GERD- stable on protonix 40 mg twice daily   Anxiety- controlled on cymbalta 60 mg daily   htn- on nifedipine and losartan for blood pressure   LE edema- controlled on lasix and potassium.   Osteopenia- cal and vit d, no exercise.   Review of Systems:  Review of Systems  Constitutional: Negative for chills, fever and weight loss.  HENT: Negative for hearing loss.   Respiratory: Positive  for cough. Negative for sputum production and shortness of breath.   Cardiovascular: Positive for leg swelling. Negative for chest pain and palpitations.  Gastrointestinal: Negative for abdominal pain, constipation, diarrhea and heartburn.  Genitourinary: Positive for frequency (on lasix). Negative for dysuria and urgency.  Musculoskeletal: Negative for back pain, falls, joint pain and myalgias.  Skin: Negative.   Neurological: Negative for dizziness and headaches.  Psychiatric/Behavioral: Negative for depression and memory loss. The patient is not nervous/anxious and does not have insomnia.    Past Medical History:  Diagnosis Date  . Abnormal Pap smear    Over 66yrs ago  . ALKALINE PHOSPHATASE, ELEVATED 07/01/2009   Qualifier: Diagnosis of  By: [name] MD, [name]   . Allergic rhinitis   . Anemia   . Arthritis   . Asthma   . Cataract    bilateral implants  . Cough    occasional; 9-21: reports as ongoing cough , managing with cough medicine and scheduled and prn inhalers, reports mucus is clear and scant    . Cystocele 11/05/08  . Diverticulosis of colon   . DJD (degenerative joint disease)    no per pt  . Elevated alkaline phosphatase level   . GERD (gastroesophageal reflux disease)   . H/O urinary incontinence 11/06/2008  . H/O varicella   . Hard measles   . Hx of colonic polyps   . Hypercholesterolemia   . Hypertension   . Leg swelling    occasional  . 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 Overweight(278.02)   . Rectocele 11/06/2008   Large  . Sebaceous cyst    on back  . Sinusitis   . Venous insufficiency   . Wheezing    occasional  . Yeast infection    Past Surgical History:  Procedure Laterality Date  . ABDOMINAL HYSTERECTOMY  1988  . ANTERIOR AND POSTERIOR REPAIR  11/05/08  . ANTERIOR AND POSTERIOR REPAIR N/A 05/15/2019   Procedure: ANTERIOR (CYSTOCELE) AND POSTERIOR REPAIR (RECTOCELE);  Surgeon: Everett Graff, MD;  Location: [facility];  Service: Gynecology;  Laterality: N/A;  . APOGEE / PERIGEE REPAIR  10/2008   Dr. Octavio Manns  . BLADDER SUSPENSION N/A 05/15/2019   Procedure: TRANSVAGINAL TAPE (TVT) PROCEDURE;  Surgeon: Everett Graff, MD;  Location: [facility];  Service: Gynecology;  Laterality: N/A;  . bladder tack  2011  . BREAST SURGERY  1978   cyst removed from both removed  . cataracts   approx 10 years   Dr Johnnye Sima , ctaract with lens placement   . COLONOSCOPY    . CYSTOSCOPY N/A 05/15/2019   Procedure: CYSTOSCOPY;  Surgeon: Everett Graff, MD;  Location: [facility];  Service: Gynecology;  Laterality: N/A;  . ESOPHAGOGASTRODUODENOSCOPY     Social History:   reports that she has never smoked. She has never used smokeless tobacco. She reports current alcohol use of about 2.0 standard drinks of alcohol per week. She reports that she does not use drugs.  Family History  Problem Relation Age of Onset  . Aneurysm Mother   . Lung cancer Father   . Hypertension Daughter   . Hypertension Daughter   . Colon cancer Neg Hx   . Esophageal cancer Neg Hx   . Stomach cancer Neg Hx   . Rectal cancer Neg Hx   . Colon polyps Neg Hx     Medications: Patient's Medications  New Prescriptions   No medications on file  Previous Medications   ALBUTEROL (PROVENTIL HFA;VENTOLIN HFA) 108 (90 BASE) MCG/ACT INHALER    Inhale 2 puffs into the lungs every 6 (six) hours as needed for wheezing or shortness of breath.   BUDESONIDE-FORMOTEROL (SYMBICORT) 160-4.5 MCG/ACT INHALER    Inhale 2 puffs into the lungs 2 (two) times daily.   CALCIUM CARB-CHOLECALCIFEROL (CALCIUM 600 + D PO)    Take 1 tablet by mouth 2 (two) times daily.    CETIRIZINE (ZYRTEC) 10 MG TABLET    Take 10 mg by mouth daily.   CHLORPHENIRAMINE-HYDROCODONE (TUSSIONEX PENNKINETIC ER) 10-8 MG/5ML SUER    Take 5 mLs by mouth every 12 (twelve) hours as needed for cough.   DULOXETINE (CYMBALTA) 60 MG CAPSULE    Take 1 capsule (60 mg  total) by mouth daily. DX (F43.22)   EPINEPHRINE 0.3 MG/0.3 ML IJ SOAJ INJECTION    epinephrine 0.3 mg/0.3 mL injection, auto-injector   FLUTICASONE (FLONASE) 50 MCG/ACT NASAL SPRAY    2 sprays daily.   FUROSEMIDE (LASIX) 40 MG TABLET    TAKE 1 TABLET BY MOUTH EVERY DAY   LEVOCETIRIZINE (XYZAL) 5 MG TABLET    Take 1 tablet (5 mg total) by mouth every evening.   LOSARTAN (COZAAR) 25 MG TABLET    TAKE 3 TABLETS BY MOUTH EVERY DAY   MONTELUKAST (SINGULAIR) 10 MG TABLET    Take 10 mg by mouth at bedtime.     MULTIPLE VITAMINS-MINERALS (MACULAR VITAMIN BENEFIT PO)    Take 1 capsule by mouth 2 (two)  times daily.     NEOMYCIN-POLYMYXIN B-DEXAMETHASONE (MAXITROL) 3.5-10000-0.1 OINT    Place 3.5 application into the left eye as needed.   NIFEDIPINE (PROCARDIA XL/NIFEDICAL XL) 60 MG 24 HR TABLET    TAKE 1 TABLET BY MOUTH EVERY DAY   NON FORMULARY    Allergy shots once every other week   OMEGA-3 FATTY ACIDS (FISH OIL PO)    Take by mouth daily.   PANTOPRAZOLE (PROTONIX) 40 MG TABLET    TAKE 1 TABLET (40 MG TOTAL) BY MOUTH 2 (TWO) TIMES DAILY. FOR STOMACH   POTASSIUM CHLORIDE SA (KLOR-CON M20) 20 MEQ TABLET    TAKE TWO TABLETS BY MOUTH ONCE DAILY FOR POTASSIUM SUPPLEMENT   PRAVASTATIN (PRAVACHOL) 40 MG TABLET    TAKE 1 TABLET BY MOUTH EVERY DAY   PSYLLIUM (METAMUCIL) 58.6 % PACKET    Take 1 packet by mouth daily as needed (constipation).   SPIRIVA HANDIHALER 18 MCG INHALATION CAPSULE    Place 18 mcg into inhaler and inhale daily.    SYSTANE ULTRA 0.4-0.3 % SOLN    SMARTSIG:1 Drop(s) In Eye(s) As Needed   VITAMIN B-12 (CYANOCOBALAMIN) 1000 MCG TABLET    Take 1,000 mcg by mouth daily.  Modified Medications   No medications on file  Discontinued Medications   IBUPROFEN (ADVIL) 600 MG TABLET    take 1 tablet po pc every 6 hours for 5 days then as needed for post operative pain   OXYCODONE-ACETAMINOPHEN (PERCOCET) 5-325 MG TABLET    take 1 tablet po every 6 hours as needed for breakthrough post operative pain     SOLIFENACIN (VESICARE) 5 MG TABLET    Take 5 mg by mouth daily.    Physical Exam:  Vitals:   02/17/20 1029  BP: 128/78  Pulse: 83  Temp: (!) 96.6 F (35.9 C)  TempSrc: Temporal  SpO2: 98%  Weight: 224 lb (101.6 kg)  Height: 5\' 3"  (1.6 m)   Body mass index is 39.68 kg/m. Wt Readings from Last 3 Encounters:  02/17/20 224 lb (101.6 kg)  09/19/19 223 lb (101.2 kg)  09/05/19 223 lb (101.2 kg)    Physical Exam Constitutional:      General: She is not in acute distress.    Appearance: She is well-developed. She is not diaphoretic.  HENT:     Head: Normocephalic and atraumatic.  Eyes:     Conjunctiva/sclera: Conjunctivae normal.     Pupils: Pupils are equal, round, and reactive to light.  Cardiovascular:     Rate and Rhythm: Normal rate and regular rhythm.     Heart sounds: Normal heart sounds.  Pulmonary:     Effort: Pulmonary effort is normal.     Breath sounds: Normal breath sounds.  Abdominal:     General: Bowel sounds are normal.     Palpations: Abdomen is soft.  Musculoskeletal:        General: No tenderness.     Cervical back: Normal range of motion and neck supple.     Right lower leg: Edema (1+) present.     Left lower leg: Edema (1+) present.  Skin:    General: Skin is warm and dry.  Neurological:     Mental Status: She is alert and oriented to person, place, and time.    Labs reviewed: Basic Metabolic Panel: Recent Labs    03/15/19 0840 05/13/19 1426 08/26/19 0845  NA 140 138 143  K 3.8 4.2 3.7  CL 108 106 107  CO2 27 23 28  GLUCOSE 107* 123* 101*  BUN 13 12 16   CREATININE 0.86 0.94 0.83  CALCIUM 9.5 9.2 9.6   Liver Function Tests: Recent Labs    03/15/19 0840 08/26/19 0845  AST 18 20  ALT 13 13  BILITOT 0.5 0.4  PROT 7.1 7.4   No results for input(s): LIPASE, AMYLASE in the last 8760 hours. No results for input(s): AMMONIA in the last 8760 hours. CBC: Recent Labs    03/15/19 0840 03/15/19 0840 05/13/19 1426 05/16/19 0621  08/26/19 0845  WBC 5.8   < > 6.5 14.3* 6.1  NEUTROABS 2,714  --   --   --  3,050  HGB 11.4*   < > 11.3* 11.1* 11.4*  HCT 34.9*   < > 36.4 35.8* 35.9  MCV 81.2   < > 86.3 86.3 82.7  PLT 264   < > 264 253 275   < > = values in this interval not displayed.   Lipid Panel: Recent Labs    08/26/19 0845  CHOL 157  HDL 57  LDLCALC 84  TRIG 70  CHOLHDL 2.8   TSH: No results for input(s): TSH in the last 8760 hours. A1C: Lab Results  Component Value Date   HGBA1C 5.6 05/26/2014     Assessment/Plan 1. Essential hypertension -stable, encouraged to continue to work on dietary modifications. DASH diet. Continues on losartan and nifedipine 60 mg daily.  - CBC with Differential/Platelet - COMPLETE METABOLIC PANEL WITH GFR - COMPLETE METABOLIC PANEL WITH GFR; Future - CBC with Differential/Platelet; Future  2. Other allergic rhinitis Stable, continues on zxyzal and singulair.   3. Mild intermittent asthma, unspecified whether complicated -stable, continues on advair, uses albuterol PRN  4. Gastroesophageal reflux disease without esophagitis Controlled on protonix BID  5. Osteopenia, unspecified location -discussed importance of weight bearing activity, she does not current do any exercise. She is taking cal and vit d BID. Up to date on dexa scan  6. Hyperlipidemia LDL goal <130 Continue heart healthy diet with pravastatin.  - COMPLETE METABOLIC PANEL WITH GFR - COMPLETE METABOLIC PANEL WITH GFR; Future - Lipid Panel; Future  7. Chronic cough -ongoing, uses tussinex PRN, tries not to use often, insurance does not cover medication.  8. Adjustment disorder with anxious mood Stable on cymbalta, reports mood is doing well.   9. Class 2 severe obesity due to excess calories with serious comorbidity and body mass index (BMI) of 39.0 to 39.9 in adult [facility]) Discussed today, encouraged weight loss through increase in physical activity and decrease in calories, heart healthy diet.     10. Advance care planning Reviewed the MOST form with pt today, she is hesitant to put anything on file but reports she will review it. We can fill out at next visit or she can make an appt to complete form.  Next appt: 6 months, labs prior Laquanda Bick K[facility] [place], [facility] [phone]

## 2020-02-17 NOTE — Patient Instructions (Signed)
To increase physical activity slowly  To look over MOST form- you can make appt for [place] to fill this out together or we can do it at your next office visit

## 2020-02-18 LAB — COMPLETE METABOLIC PANEL WITH GFR
AG Ratio: 1.2 (calc) (ref 1.0–2.5)
ALT: 14 U/L (ref 6–29)
AST: 19 U/L (ref 10–35)
Albumin: 4 g/dL (ref 3.6–5.1)
Alkaline phosphatase (APISO): 163 U/L — ABNORMAL HIGH (ref 37–153)
BUN: 13 mg/dL (ref 7–25)
CO2: 26 mmol/L (ref 20–32)
Calcium: 9.5 mg/dL (ref 8.6–10.4)
Chloride: 107 mmol/L (ref 98–110)
Creat: 0.72 mg/dL (ref 0.60–0.93)
GFR, Est African American: 94 mL/min/{1.73_m2} (ref >=60)
GFR, Est Non African American: 81 mL/min/{1.73_m2} (ref >=60)
Globulin: 3.4 g/dL (calc) (ref 1.9–3.7)
Glucose, Bld: 90 mg/dL (ref 65–139)
Potassium: 4.4 mmol/L (ref 3.5–5.3)
Sodium: 140 mmol/L (ref 135–146)
Total Bilirubin: 0.4 mg/dL (ref 0.2–1.2)
Total Protein: 7.4 g/dL (ref 6.1–8.1)

## 2020-02-18 LAB — CBC WITH DIFFERENTIAL/PLATELET
Absolute Monocytes: 451 cells/uL (ref 200–950)
Basophils Absolute: 43 cells/uL (ref 0–200)
Basophils Relative: 0.7 %
Eosinophils Absolute: 189 cells/uL (ref 15–500)
Eosinophils Relative: 3.1 %
HCT: 36.3 % (ref 35.0–45.0)
Hemoglobin: 11.8 g/dL (ref 11.7–15.5)
Lymphs Abs: 2208 cells/uL (ref 850–3900)
MCH: 26.5 pg — ABNORMAL LOW (ref 27.0–33.0)
MCHC: 32.5 g/dL (ref 32.0–36.0)
MCV: 81.6 fL (ref 80.0–100.0)
MPV: 11.4 fL (ref 7.5–12.5)
Monocytes Relative: 7.4 %
Neutro Abs: 3209 cells/uL (ref 1500–7800)
Neutrophils Relative %: 52.6 %
Platelets: 277 10*3/uL (ref 140–400)
RBC: 4.45 10*6/uL (ref 3.80–5.10)
RDW: 14.4 % (ref 11.0–15.0)
Total Lymphocyte: 36.2 %
WBC: 6.1 10*3/uL (ref 3.8–10.8)

## 2020-02-20 DIAGNOSIS — R05 Cough: Secondary | ICD-10-CM | POA: Diagnosis not present

## 2020-02-20 DIAGNOSIS — K219 Gastro-esophageal reflux disease without esophagitis: Secondary | ICD-10-CM | POA: Diagnosis not present

## 2020-02-21 DIAGNOSIS — J3089 Other allergic rhinitis: Secondary | ICD-10-CM | POA: Diagnosis not present

## 2020-02-21 DIAGNOSIS — J301 Allergic rhinitis due to pollen: Secondary | ICD-10-CM | POA: Diagnosis not present

## 2020-02-21 DIAGNOSIS — J3081 Allergic rhinitis due to animal (cat) (dog) hair and dander: Secondary | ICD-10-CM | POA: Diagnosis not present

## 2020-02-27 DIAGNOSIS — Z6841 Body Mass Index (BMI) 40.0 and over, adult: Secondary | ICD-10-CM | POA: Diagnosis not present

## 2020-02-27 DIAGNOSIS — Z01419 Encounter for gynecological examination (general) (routine) without abnormal findings: Secondary | ICD-10-CM | POA: Diagnosis not present

## 2020-02-27 DIAGNOSIS — R32 Unspecified urinary incontinence: Secondary | ICD-10-CM | POA: Diagnosis not present

## 2020-02-27 DIAGNOSIS — R05 Cough: Secondary | ICD-10-CM | POA: Diagnosis not present

## 2020-02-28 ENCOUNTER — Other Ambulatory Visit: Payer: Self-pay | Admitting: Nurse Practitioner

## 2020-02-28 DIAGNOSIS — F4322 Adjustment disorder with anxiety: Secondary | ICD-10-CM

## 2020-03-04 DIAGNOSIS — H40023 Open angle with borderline findings, high risk, bilateral: Secondary | ICD-10-CM | POA: Diagnosis not present

## 2020-03-06 DIAGNOSIS — J3089 Other allergic rhinitis: Secondary | ICD-10-CM | POA: Diagnosis not present

## 2020-03-06 DIAGNOSIS — J3081 Allergic rhinitis due to animal (cat) (dog) hair and dander: Secondary | ICD-10-CM | POA: Diagnosis not present

## 2020-03-06 DIAGNOSIS — J301 Allergic rhinitis due to pollen: Secondary | ICD-10-CM | POA: Diagnosis not present

## 2020-03-17 DIAGNOSIS — Z1231 Encounter for screening mammogram for malignant neoplasm of breast: Secondary | ICD-10-CM | POA: Diagnosis not present

## 2020-03-17 LAB — HM MAMMOGRAPHY

## 2020-03-20 ENCOUNTER — Telehealth: Payer: Self-pay

## 2020-03-20 DIAGNOSIS — J301 Allergic rhinitis due to pollen: Secondary | ICD-10-CM | POA: Diagnosis not present

## 2020-03-20 DIAGNOSIS — J3089 Other allergic rhinitis: Secondary | ICD-10-CM | POA: Diagnosis not present

## 2020-03-20 DIAGNOSIS — J3081 Allergic rhinitis due to animal (cat) (dog) hair and dander: Secondary | ICD-10-CM | POA: Diagnosis not present

## 2020-03-20 NOTE — Telephone Encounter (Signed)
Called patient x 2 on home phone, number is a non working number.  I called patient mobile phone and got a recording stating the [employer] customer is unavailable and I need to try my call again later.  Reason for call: Confirm that patient was notified by the [facility] that her mammogram was abnormal and a diagnostic mammogram is recommended.   I will try to contact patient again on Monday 03/23/2020

## 2020-03-23 NOTE — Telephone Encounter (Signed)
Spoke with patient, patient was notified by [facility] and has a follow-up appointment on Monday

## 2020-03-30 ENCOUNTER — Encounter: Payer: Self-pay | Admitting: Nurse Practitioner

## 2020-03-30 DIAGNOSIS — R921 Mammographic calcification found on diagnostic imaging of breast: Secondary | ICD-10-CM | POA: Diagnosis not present

## 2020-04-03 DIAGNOSIS — J301 Allergic rhinitis due to pollen: Secondary | ICD-10-CM | POA: Diagnosis not present

## 2020-04-03 DIAGNOSIS — J3081 Allergic rhinitis due to animal (cat) (dog) hair and dander: Secondary | ICD-10-CM | POA: Diagnosis not present

## 2020-04-03 DIAGNOSIS — J3089 Other allergic rhinitis: Secondary | ICD-10-CM | POA: Diagnosis not present

## 2020-04-06 ENCOUNTER — Ambulatory Visit: Payer: PPO | Admitting: Pulmonary Disease

## 2020-04-16 DIAGNOSIS — J301 Allergic rhinitis due to pollen: Secondary | ICD-10-CM | POA: Diagnosis not present

## 2020-04-16 DIAGNOSIS — J3081 Allergic rhinitis due to animal (cat) (dog) hair and dander: Secondary | ICD-10-CM | POA: Diagnosis not present

## 2020-04-16 DIAGNOSIS — J3089 Other allergic rhinitis: Secondary | ICD-10-CM | POA: Diagnosis not present

## 2020-04-17 ENCOUNTER — Other Ambulatory Visit: Payer: Self-pay | Admitting: Nurse Practitioner

## 2020-04-23 ENCOUNTER — Ambulatory Visit: Payer: PPO | Admitting: Pulmonary Disease

## 2020-04-24 DIAGNOSIS — J3089 Other allergic rhinitis: Secondary | ICD-10-CM | POA: Diagnosis not present

## 2020-04-24 DIAGNOSIS — J3081 Allergic rhinitis due to animal (cat) (dog) hair and dander: Secondary | ICD-10-CM | POA: Diagnosis not present

## 2020-04-24 DIAGNOSIS — J4540 Moderate persistent asthma, uncomplicated: Secondary | ICD-10-CM | POA: Diagnosis not present

## 2020-04-24 DIAGNOSIS — J301 Allergic rhinitis due to pollen: Secondary | ICD-10-CM | POA: Diagnosis not present

## 2020-04-29 ENCOUNTER — Other Ambulatory Visit: Payer: Self-pay | Admitting: Nurse Practitioner

## 2020-04-29 NOTE — Telephone Encounter (Signed)
Pharmacy requested refill.  Pended Rx and sent to [facility] for approval due to HIGH ALERT Warning. Jessica out of office.  

## 2020-05-01 DIAGNOSIS — J301 Allergic rhinitis due to pollen: Secondary | ICD-10-CM | POA: Diagnosis not present

## 2020-05-01 DIAGNOSIS — J3081 Allergic rhinitis due to animal (cat) (dog) hair and dander: Secondary | ICD-10-CM | POA: Diagnosis not present

## 2020-05-01 DIAGNOSIS — J3089 Other allergic rhinitis: Secondary | ICD-10-CM | POA: Diagnosis not present

## 2020-05-08 ENCOUNTER — Other Ambulatory Visit: Payer: Self-pay

## 2020-05-08 ENCOUNTER — Ambulatory Visit: Payer: PPO | Admitting: Podiatry

## 2020-05-08 ENCOUNTER — Encounter: Payer: Self-pay | Admitting: Podiatry

## 2020-05-08 DIAGNOSIS — I872 Venous insufficiency (chronic) (peripheral): Secondary | ICD-10-CM

## 2020-05-08 DIAGNOSIS — B351 Tinea unguium: Secondary | ICD-10-CM | POA: Diagnosis not present

## 2020-05-08 DIAGNOSIS — M79675 Pain in left toe(s): Secondary | ICD-10-CM

## 2020-05-08 DIAGNOSIS — M79674 Pain in right toe(s): Secondary | ICD-10-CM | POA: Diagnosis not present

## 2020-05-08 NOTE — Progress Notes (Signed)
This patient returns to my office for at risk foot care.  This patient requires this care by a professional since this patient will be at risk due to having venous insufficiency  This patient is unable to cut nails herself since the patient cannot reach her nails.These nails are painful walking and wearing shoes.  This patient presents for at risk foot care today.  General Appearance  Alert, conversant and in no acute stress.  Vascular  Dorsalis pedis and posterior tibial  pulses are palpable  bilaterally.  Capillary return is within normal limits  bilaterally. Temperature is within normal limits  bilaterally.  Neurologic  Senn-Weinstein monofilament wire test within normal limits  bilaterally. Muscle power within normal limits bilaterally.  Nails Thick disfigured discolored nails with subungual debris  from hallux to fifth toes bilaterally. No evidence of bacterial infection or drainage bilaterally.  Orthopedic  No limitations of motion  feet .  No crepitus or effusions noted.  HAV with hammer toes  B/L.  Skin  normotropic skin with no porokeratosis noted bilaterally.  No signs of infections or ulcers noted.     Onychomycosis  Pain in right toes  Pain in left toes  Consent was obtained for treatment procedures.   Mechanical debridement of nails 1-5  bilaterally performed with a nail nipper.  Filed with dremel without incident. No infection or ulcer.     Return office visit    12 weeks                Told patient to return for periodic foot care and evaluation due to potential at risk complications.   Hiro Vipond DPM  

## 2020-05-12 ENCOUNTER — Telehealth: Payer: Self-pay

## 2020-05-12 ENCOUNTER — Ambulatory Visit (INDEPENDENT_AMBULATORY_CARE_PROVIDER_SITE_OTHER): Payer: PPO | Admitting: Nurse Practitioner

## 2020-05-12 ENCOUNTER — Other Ambulatory Visit: Payer: Self-pay

## 2020-05-12 ENCOUNTER — Encounter: Payer: Self-pay | Admitting: Nurse Practitioner

## 2020-05-12 DIAGNOSIS — Z0000 Encounter for general adult medical examination without abnormal findings: Secondary | ICD-10-CM | POA: Diagnosis not present

## 2020-05-12 DIAGNOSIS — Z1159 Encounter for screening for other viral diseases: Secondary | ICD-10-CM | POA: Diagnosis not present

## 2020-05-12 NOTE — Progress Notes (Signed)
Subjective:   Wendy Figueroa is a 74 y.o. female who presents for Medicare Annual (Subsequent) preventive examination.  Review of Systems     Cardiac Risk Factors include: advanced age (>63men, >62 women);hypertension;dyslipidemia;obesity (BMI >30kg/m2);sedentary lifestyle     Objective:    There were no vitals filed for this visit. There is no height or weight on file to calculate BMI.  Advanced Directives 05/12/2020 02/17/2020 08/19/2019 05/15/2019 05/06/2019 03/20/2019 11/15/2018  Does Patient Have a Medical Advance Directive? No Yes No No No No No  Type of Advance Directive - Living will - - - - -  Does patient want to make changes to medical advance directive? - No - Patient declined Yes (MAU/Ambulatory/Procedural Areas - Information given) - - - -  Would patient like information on creating a medical advance directive? - - - No - Patient declined Yes (MAU/Ambulatory/Procedural Areas - Information given) Yes (MAU/Ambulatory/Procedural Areas - Information given) No - Patient declined    Current Medications (verified) Outpatient Encounter Medications as of 05/12/2020  Medication Sig  . albuterol (PROVENTIL HFA;VENTOLIN HFA) 108 (90 Base) MCG/ACT inhaler Inhale 2 puffs into the lungs every 6 (six) hours as needed for wheezing or shortness of breath.  . budesonide-formoterol (SYMBICORT) 160-4.5 MCG/ACT inhaler Inhale 2 puffs into the lungs 2 (two) times daily.  . Calcium Carb-Cholecalciferol (CALCIUM 600 + D PO) Take 1 tablet by mouth 2 (two) times daily.   . cetirizine (ZYRTEC) 10 MG tablet Take 10 mg by mouth daily.  . chlorpheniramine-HYDROcodone (TUSSIONEX PENNKINETIC ER) 10-8 MG/5ML SUER Take 5 mLs by mouth every 12 (twelve) hours as needed for cough.  . DULoxetine (CYMBALTA) 60 MG capsule TAKE 1 CAPSULE (60 MG TOTAL) BY MOUTH DAILY. DX (F43.22)  . EPINEPHrine 0.3 mg/0.3 mL IJ SOAJ injection epinephrine 0.3 mg/0.3 mL injection, auto-injector  . fluticasone (FLONASE) 50 MCG/ACT  nasal spray 2 sprays daily.  . furosemide (LASIX) 40 MG tablet TAKE 1 TABLET BY MOUTH EVERY DAY  . levocetirizine (XYZAL) 5 MG tablet Take 1 tablet (5 mg total) by mouth every evening.  Marland Kitchen losartan (COZAAR) 25 MG tablet TAKE 3 TABLETS BY MOUTH EVERY DAY  . montelukast (SINGULAIR) 10 MG tablet Take 10 mg by mouth at bedtime.    . Multiple Vitamins-Minerals (MACULAR VITAMIN BENEFIT PO) Take 1 capsule by mouth 2 (two) times daily.    Marland Kitchen neomycin-polymyxin b-dexamethasone (MAXITROL) 3.5-10000-0.1 OINT Place 3.5 application into the left eye as needed.  Marland Kitchen NIFEdipine (PROCARDIA XL/NIFEDICAL XL) 60 MG 24 hr tablet TAKE 1 TABLET BY MOUTH EVERY DAY  . NON FORMULARY Allergy shots once every other week  . Omega-3 Fatty Acids (FISH OIL PO) Take by mouth daily.  Marland Kitchen oxybutynin (DITROPAN) 5 MG tablet oxybutynin chloride 5 mg tablet  TAKE 1 TABLET BY MOUTH TWICE A DAY  . pantoprazole (PROTONIX) 40 MG tablet TAKE 1 TABLET (40 MG TOTAL) BY MOUTH 2 (TWO) TIMES DAILY. FOR STOMACH  . potassium chloride SA (KLOR-CON M20) 20 MEQ tablet TAKE TWO TABLETS BY MOUTH ONCE DAILY FOR POTASSIUM SUPPLEMENT  . pravastatin (PRAVACHOL) 40 MG tablet TAKE 1 TABLET BY MOUTH EVERY DAY  . psyllium (METAMUCIL) 58.6 % packet Take 1 packet by mouth daily as needed (constipation).  . SPIRIVA HANDIHALER 18 MCG inhalation capsule Place 18 mcg into inhaler and inhale daily.   . SYSTANE ULTRA 0.4-0.3 % SOLN SMARTSIG:1 Drop(s) In Eye(s) As Needed  . vitamin B-12 (CYANOCOBALAMIN) 1000 MCG tablet Take 1,000 mcg by mouth daily.   Facility-Administered  Encounter Medications as of 05/12/2020  Medication  . 0.9 %  sodium chloride infusion    Allergies (verified) Demerol, Lisinopril-hydrochlorothiazide, and Penicillins   History: Past Medical History:  Diagnosis Date  . Abnormal Pap smear    Over 72yrs ago  . ALKALINE PHOSPHATASE, ELEVATED 07/01/2009   Qualifier: Diagnosis of  By: [name] MD, [name]   . Allergic rhinitis   . Anemia   .  Arthritis   . Asthma   . Cataract    bilateral implants  . Cough    occasional; 9-21: reports as ongoing cough , managing with cough medicine and scheduled and prn inhalers, reports mucus is clear and scant    . Cystocele 11/05/08  . Diverticulosis of colon   . DJD (degenerative joint disease)    no per pt  . Elevated alkaline phosphatase level   . GERD (gastroesophageal reflux disease)   . H/O urinary incontinence 11/06/2008  . H/O varicella   . Hard measles   . Hx of colonic polyps   . Hypercholesterolemia   . Hypertension   . Leg swelling    occasional  . 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 Overweight(278.02)   . Rectocele 11/06/2008   Large  . Sebaceous cyst    on back  . Sinusitis   . Venous insufficiency   . Wheezing    occasional  . Yeast infection    Past Surgical History:  Procedure Laterality Date  . ABDOMINAL HYSTERECTOMY  1988  . ANTERIOR AND POSTERIOR REPAIR  11/05/08  . ANTERIOR AND POSTERIOR REPAIR N/A 05/15/2019   Procedure: ANTERIOR (CYSTOCELE) AND POSTERIOR REPAIR (RECTOCELE);  Surgeon: Everett Graff, MD;  Location[facility];  Service: Gynecology;  Laterality: N/A;  . APOGEE / PERIGEE REPAIR  10/2008   Dr. Octavio Manns  . BLADDER SUSPENSION N/A 05/15/2019   Procedure: TRANSVAGINAL TAPE (TVT) PROCEDURE;  Surgeon: Everett Graff, MD;  Location: [facility];  Service: Gynecology;  Laterality: N/A;  . bladder tack  2011  . BREAST SURGERY  1978   cyst removed from both removed  . cataracts   approx 10 years   Dr Johnnye Sima , ctaract with lens placement   . COLONOSCOPY    . CYSTOSCOPY N/A 05/15/2019   Procedure: CYSTOSCOPY;  Surgeon: Everett Graff, MD;  Location: [facility];  Service: Gynecology;  Laterality: N/A;  . ESOPHAGOGASTRODUODENOSCOPY     Family History  Problem Relation Age of Onset  . Aneurysm Mother   . Lung cancer Father   . Hypertension Daughter   . Hypertension Daughter   .  Colon cancer Neg Hx   . Esophageal cancer Neg Hx   . Stomach cancer Neg Hx   . Rectal cancer Neg Hx   . Colon polyps Neg Hx    Social History   Socioeconomic History  . Marital status: Married    Spouse name: [name]  . Number of children: 2  . Years of education: Not on file  . Highest education level: Not on file  Occupational History  . Occupation: LPN   Tobacco Use  . Smoking status: Never Smoker  . Smokeless tobacco: Never Used  Vaping Use  . Vaping Use: Never used  Substance and Sexual Activity  . Alcohol use: Yes    Alcohol/week: 2.0 standard drinks    Types: 2 Cans of beer per week    Comment: 2 beers on the weekend  . Drug use: No  . Sexual activity: Not  Currently    Partners: Male    Birth control/protection: None  Other Topics Concern  . Not on file  Social History Narrative   Married   Never smoked   Alcohol few beers on week-end   No Advance Directive    Social Determinants of Health   Financial Resource Strain:   . Difficulty of Paying Living Expenses: Not on file  Food Insecurity:   . Worried About Charity fundraiser in the Last Year: Not on file  . Ran Out of Food in the Last Year: Not on file  Transportation Needs:   . Lack of Transportation (Medical): Not on file  . Lack of Transportation (Non-Medical): Not on file  Physical Activity:   . Days of Exercise per Week: Not on file  . Minutes of Exercise per Session: Not on file  Stress:   . Feeling of Stress : Not on file  Social Connections:   . Frequency of Communication with Friends and Family: Not on file  . Frequency of Social Gatherings with Friends and Family: Not on file  . Attends Religious Services: Not on file  . Active Member of Clubs or Organizations: Not on file  . Attends Archivist Meetings: Not on file  . Marital Status: Not on file    Tobacco Counseling Counseling given: Not Answered   Clinical Intake:  Pre-visit preparation completed: Yes  Pain :  No/denies pain     BMI - recorded: 39 Nutritional Status: BMI > 30  Obese Diabetes: No  How often do you need to have someone help you when you read instructions, pamphlets, or other written materials from your doctor or pharmacy?: 1 - Never  Diabetic?no         Activities of Daily Living In your present state of health, do you have any difficulty performing the following activities: 05/12/2020 05/15/2019  Hearing? N N  Vision? N N  Difficulty concentrating or making decisions? Y N  Walking or climbing stairs? N N  Dressing or bathing? N N  Doing errands, shopping? N -  Preparing Food and eating ? N -  Using the Toilet? N -  In the past six months, have you accidently leaked urine? Y -  Do you have problems with loss of bowel control? Y -  Managing your Medications? N -  Managing your Finances? N -  Housekeeping or managing your Housekeeping? N -  Some recent data might be hidden    Patient Care Team: Lauree Chandler, NP as PCP - General (Geriatric Medicine) Monna Fam, MD as Consulting Physician (Ophthalmology)  Indicate any recent Medical Services you may have received from other than [facility] providers in the past year (date may be approximate).     Assessment:   This is a routine wellness examination for Shantele.  Hearing/Vision screen  Hearing Screening   125Hz  250Hz  500Hz  1000Hz  2000Hz  3000Hz  4000Hz  6000Hz  8000Hz   Right ear:           Left ear:           Comments: Patient has no hearing problems.  Vision Screening Comments: Patient wears glasses. Patient has had a recent eye exam. Patient goes every six months.  Dietary issues and exercise activities discussed:    Goals    .  Exercise 3x per week (30 min per time) (pt-stated)      Starting today I will get up early enough to go to the gym 2-3 times a week.    Marland Kitchen  Patient Stated      Better controlled of urinary incontinence.       Depression Screen PHQ 2/9 Scores 05/12/2020 02/17/2020 08/19/2019  05/06/2019 04/30/2018 08/29/2017 01/18/2017  PHQ - 2 Score 0 0 0 0 0 0 0    Fall Risk Fall Risk  05/12/2020 02/17/2020 08/19/2019 05/06/2019 07/17/2018  Falls in the past year? 0 0 0 0 0  Number falls in past yr: 0 0 0 0 -  Injury with Fall? 0 0 0 0 -    Any stairs in or around the home? Yes  If so, are there any without handrails? no Home free of loose throw rugs in walkways, pet beds, electrical cords, etc? Yes  Adequate lighting in your home to reduce risk of falls? Yes   ASSISTIVE DEVICES UTILIZED TO PREVENT FALLS:  Life alert? No  Use of a cane, walker or w/c? No  Grab bars in the bathroom? Yes  Shower chair or bench in shower? No  Elevated toilet seat or a handicapped toilet? No   TIMED UP AND GO:  Was the test performed? No .   Cognitive Function: MMSE - Mini Mental State Exam 04/30/2018 01/18/2017 09/09/2016 05/29/2015  Orientation to time 4 5 4 5   Orientation to Place 5 5 5 5   Registration 3 3 3 3   Attention/ Calculation 4 5 3 5   Recall 2 2 2 3   Language- name 2 objects 2 2 2 2   Language- repeat 1 1 1 1   Language- follow 3 step command 3 3 2 3   Language- read & follow direction 1 1 1 1   Write a sentence 1 1 1 1   Copy design 1 1 1 1   Total score 27 29 25 30      6CIT Screen 05/12/2020 05/06/2019  What Year? 0 points 0 points  What month? 0 points 0 points  What time? 0 points 0 points  Count back from 20 0 points 0 points  Months in reverse 4 points 0 points  Repeat phrase 0 points 0 points  Total Score 4 0    Immunizations Immunization History  Administered Date(s) Administered  . Influenza Split 06/11/2011, 05/27/2013  . Influenza Whole 07/05/2010, 04/23/2012  . Influenza, High Dose Seasonal PF 04/30/2018, 06/06/2019  . Influenza,inj,Quad PF,6+ Mos 05/29/2015, 05/06/2016, 05/17/2017  . Influenza-Unspecified 05/22/2014, 10/26/2015  . Moderna SARS-COVID-2 Vaccination 09/27/2019, 10/18/2019  . PFIZER SARS-COV-2 Vaccination 09/27/2019  . Pneumococcal Conjugate-13  10/14/2014  . Pneumococcal Polysaccharide-23 07/29/2010  . Tdap 08/22/2016    TDAP status: Up to date Due for flu shot, educated Pneumococcal vaccine status: Up to date Covid-19 vaccine status: Completed vaccines  Qualifies for Shingles Vaccine? Yes   Zostavax completed No   Shingrix Completed?: No.    Education has been provided regarding the importance of this vaccine. Patient has been advised to call insurance company to determine out of pocket expense if they have not yet received this vaccine. Advised may also receive vaccine at local pharmacy or Health Dept. Verbalized acceptance and understanding.  Screening Tests Health Maintenance  Topic Date Due  . INFLUENZA VACCINE  03/22/2020  . TETANUS/TDAP  08/22/2026  . DEXA SCAN  Completed  . COVID-19 Vaccine  Completed  . PNA vac Low Risk Adult  Completed  . Hepatitis C Screening  Discontinued    Health Maintenance  Health Maintenance Due  Topic Date Due  . INFLUENZA VACCINE  03/22/2020    Colorectal cancer screening: No longer required.  Mammogram status: Completed 2021. Repeat  every year Bone Density status: Completed 2020. Results reflect: Bone density results: OSTEOPENIA. Repeat every 2 years.  Lung Cancer Screening: (Low Dose CT Chest recommended if Age 79-80 years, 30 pack-year currently smoking OR have quit w/in 15years.) does not qualify.   Lung Cancer Screening Referral: na  Additional Screening:  Hepatitis C Screening: does qualify; will  Complete with next blood work.   Vision Screening: Recommended annual ophthalmology exams for early detection of glaucoma and other disorders of the eye. Is the patient up to date with their annual eye exam?  Yes  Who is the provider or what is the name of the office in which the patient attends annual eye exams? Dr Herbert Deaner If pt is not established with a provider, would they like to be referred to a provider to establish care? No .   Dental Screening: Recommended annual  dental exams for proper oral hygiene  Community Resource Referral / Chronic Care Management: CRR required this visit?  No   CCM required this visit?  No      Plan:     I have personally reviewed and noted the following in the patient's chart:   . Medical and social history . Use of alcohol, tobacco or illicit drugs  . Current medications and supplements . Functional ability and status . Nutritional status . Physical activity . Advanced directives . List of other physicians . Hospitalizations, surgeries, and ER visits in previous 12 months . Vitals . Screenings to include cognitive, depression, and falls . Referrals and appointments  In addition, I have reviewed and discussed with patient certain preventive protocols, quality metrics, and best practice recommendations. A written personalized care plan for preventive services as well as general preventive health recommendations were provided to patient.     Lauree Chandler, NP   05/12/2020    Virtual Visit via Telephone Note  I connected with@ on 05/12/20 at  1:00 PM EDT by telephone and verified that I am speaking with the correct person using two identifiers.  Location: Patient: home Provider: [facility] [place] [facility]   I discussed the limitations, risks, security and privacy concerns of performing an evaluation and management service by telephone and the availability of in person appointments. I also discussed with the patient that there may be a patient responsible charge related to this service. The patient expressed understanding and agreed to proceed.   I discussed the assessment and treatment plan with the patient. The patient was provided an opportunity to ask questions and all were answered. The patient agreed with the plan and demonstrated an understanding of the instructions.   The patient was advised to call back or seek an in-person evaluation if the symptoms worsen or if the condition fails to improve as  anticipated.  I provided 16 minutes of non-face-to-face time during this encounter.  Carlos American. Harle Battiest Avs printed and mailed

## 2020-05-12 NOTE — Progress Notes (Signed)
This service is provided via telemedicine  No vital signs collected/recorded due to the encounter was a telemedicine visit.   Location of patient (ex: home, work):  Home  Patient consents to a telephone visit:  Yes, see encounter dated 05/12/2020  Location of the provider (ex: office, home):  [place]  Name of any referring provider:  N/A  Names of all persons participating in the telemedicine service and their role in the encounter:  Sherrie Mustache, Nurse Practitioner, Carroll Kinds, CMA, and patient.   Time spent on call:  11 minutes with medical assistant.

## 2020-05-12 NOTE — Telephone Encounter (Signed)
Ms. ivie, savitt are scheduled for a virtual visit with your provider today.    Just as we do with appointments in the office, we must obtain your consent to participate.  Your consent will be active for this visit and any virtual visit you may have with one of our providers in the next 365 days.    If you have a MyChart account, I can also send a copy of this consent to you electronically.  All virtual visits are billed to your insurance company just like a traditional visit in the office.  As this is a virtual visit, video technology does not allow for your provider to perform a traditional examination.  This may limit your provider's ability to fully assess your condition.  If your provider identifies any concerns that need to be evaluated in person or the need to arrange testing such as labs, EKG, etc, we will make arrangements to do so.    Although advances in technology are sophisticated, we cannot ensure that it will always work on either your end or our end.  If the connection with a video visit is poor, we may have to switch to a telephone visit.  With either a video or telephone visit, we are not always able to ensure that we have a secure connection.   I need to obtain your verbal consent now.   Are you willing to proceed with your visit today?   GLENNYS SCHORSCH has provided verbal consent on 05/12/2020 for a virtual visit (video or telephone).   Carroll Kinds, CMA 05/12/2020  11:44 AM

## 2020-05-12 NOTE — Patient Instructions (Signed)
Wendy Figueroa , Thank you for taking time to come for your Medicare Wellness Visit. I appreciate your ongoing commitment to your health goals. Please review the following plan we discussed and let me know if I can assist you in the future.   Screening recommendations/referrals: Colonoscopy aged out Mammogram up to date Bone Density up to date Recommended yearly ophthalmology/optometry visit for glaucoma screening and checkup Recommended yearly dental visit for hygiene and checkup  Vaccinations: Influenza vaccine RECOMMENDED at this time.  Pneumococcal vaccine up to date Tdap vaccine up to date Shingles vaccine RECOMMENDED to get shingrix at your local pharmacy    Advanced directives: would recommend completing so we can place on file. We will go over MOST form together at your next office visit  Conditions/risks identified: obesity, memory loss, decrease in activity.   Next appointment: 1 year for AWV   Preventive Care 76 Years and Older, Female Preventive care refers to lifestyle choices and visits with your health care provider that can promote health and wellness. What does preventive care include?  A yearly physical exam. This is also called an annual well check.  Dental exams once or twice a year.  Routine eye exams. Ask your health care provider how often you should have your eyes checked.  Personal lifestyle choices, including:  Daily care of your teeth and gums.  Regular physical activity.  Eating a healthy diet.  Avoiding tobacco and drug use.  Limiting alcohol use.  Practicing safe sex.  Taking low-dose aspirin every day.  Taking vitamin and mineral supplements as recommended by your health care provider. What happens during an annual well check? The services and screenings done by your health care provider during your annual well check will depend on your age, overall health, lifestyle risk factors, and family history of disease. Counseling  Your health  care provider may ask you questions about your:  Alcohol use.  Tobacco use.  Drug use.  Emotional well-being.  Home and relationship well-being.  Sexual activity.  Eating habits.  History of falls.  Memory and ability to understand (cognition).  Work and work Statistician.  Reproductive health. Screening  You may have the following tests or measurements:  Height, weight, and BMI.  Blood pressure.  Lipid and cholesterol levels. These may be checked every 5 years, or more frequently if you are over 64 years old.  Skin check.  Lung cancer screening. You may have this screening every year starting at age 4 if you have a 30-pack-year history of smoking and currently smoke or have quit within the past 15 years.  Fecal occult blood test (FOBT) of the stool. You may have this test every year starting at age 43.  Flexible sigmoidoscopy or colonoscopy. You may have a sigmoidoscopy every 5 years or a colonoscopy every 10 years starting at age 69.  Hepatitis C blood test.  Hepatitis B blood test.  Sexually transmitted disease (STD) testing.  Diabetes screening. This is done by checking your blood sugar (glucose) after you have not eaten for a while (fasting). You may have this done every 1-3 years.  Bone density scan. This is done to screen for osteoporosis. You may have this done starting at age 49.  Mammogram. This may be done every 1-2 years. Talk to your health care provider about how often you should have regular mammograms. Talk with your health care provider about your test results, treatment options, and if necessary, the need for more tests. Vaccines  Your health care provider  may recommend certain vaccines, such as:  Influenza vaccine. This is recommended every year.  Tetanus, diphtheria, and acellular pertussis (Tdap, Td) vaccine. You may need a Td booster every 10 years.  Zoster vaccine. You may need this after age 79.  Pneumococcal 13-valent conjugate  (PCV13) vaccine. One dose is recommended after age 24.  Pneumococcal polysaccharide (PPSV23) vaccine. One dose is recommended after age 10. Talk to your health care provider about which screenings and vaccines you need and how often you need them. This information is not intended to replace advice given to you by your health care provider. Make sure you discuss any questions you have with your health care provider. Document Released: 09/04/2015 Document Revised: 04/27/2016 Document Reviewed: 06/09/2015 Elsevier Interactive Patient Education  2017 [employer] Prevention in the Home Falls can cause injuries. They can happen to people of all ages. There are many things you can do to make your home safe and to help prevent falls. What can I do on the outside of my home?  Regularly fix the edges of walkways and driveways and fix any cracks.  Remove anything that might make you trip as you walk through a door, such as a raised step or threshold.  Trim any bushes or trees on the path to your home.  Use bright outdoor lighting.  Clear any walking paths of anything that might make someone trip, such as rocks or tools.  Regularly check to see if handrails are loose or broken. Make sure that both sides of any steps have handrails.  Any raised decks and porches should have guardrails on the edges.  Have any leaves, snow, or ice cleared regularly.  Use sand or salt on walking paths during winter.  Clean up any spills in your garage right away. This includes oil or grease spills. What can I do in the bathroom?  Use night lights.  Install grab bars by the toilet and in the tub and shower. Do not use towel bars as grab bars.  Use non-skid mats or decals in the tub or shower.  If you need to sit down in the shower, use a plastic, non-slip stool.  Keep the floor dry. Clean up any water that spills on the floor as soon as it happens.  Remove soap buildup in the tub or shower  regularly.  Attach bath mats securely with double-sided non-slip rug tape.  Do not have throw rugs and other things on the floor that can make you trip. What can I do in the bedroom?  Use night lights.  Make sure that you have a light by your bed that is easy to reach.  Do not use any sheets or blankets that are too big for your bed. They should not hang down onto the floor.  Have a firm chair that has side arms. You can use this for support while you get dressed.  Do not have throw rugs and other things on the floor that can make you trip. What can I do in the kitchen?  Clean up any spills right away.  Avoid walking on wet floors.  Keep items that you use a lot in easy-to-reach places.  If you need to reach something above you, use a strong step stool that has a grab bar.  Keep electrical cords out of the way.  Do not use floor polish or wax that makes floors slippery. If you must use wax, use non-skid floor wax.  Do not have throw rugs  and other things on the floor that can make you trip. What can I do with my stairs?  Do not leave any items on the stairs.  Make sure that there are handrails on both sides of the stairs and use them. Fix handrails that are broken or loose. Make sure that handrails are as long as the stairways.  Check any carpeting to make sure that it is firmly attached to the stairs. Fix any carpet that is loose or worn.  Avoid having throw rugs at the top or bottom of the stairs. If you do have throw rugs, attach them to the floor with carpet tape.  Make sure that you have a light switch at the top of the stairs and the bottom of the stairs. If you do not have them, ask someone to add them for you. What else can I do to help prevent falls?  Wear shoes that:  Do not have high heels.  Have rubber bottoms.  Are comfortable and fit you well.  Are closed at the toe. Do not wear sandals.  If you use a stepladder:  Make sure that it is fully  opened. Do not climb a closed stepladder.  Make sure that both sides of the stepladder are locked into place.  Ask someone to hold it for you, if possible.  Clearly mark and make sure that you can see:  Any grab bars or handrails.  First and last steps.  Where the edge of each step is.  Use tools that help you move around (mobility aids) if they are needed. These include:  Canes.  Walkers.  Scooters.  Crutches.  Turn on the lights when you go into a dark area. Replace any light bulbs as soon as they burn out.  Set up your furniture so you have a clear path. Avoid moving your furniture around.  If any of your floors are uneven, fix them.  If there are any pets around you, be aware of where they are.  Review your medicines with your doctor. Some medicines can make you feel dizzy. This can increase your chance of falling. Ask your doctor what other things that you can do to help prevent falls. This information is not intended to replace advice given to you by your health care provider. Make sure you discuss any questions you have with your health care provider. Document Released: 06/04/2009 Document Revised: 01/14/2016 Document Reviewed: 09/12/2014 Elsevier Interactive Patient Education  2017 [employer].

## 2020-05-15 DIAGNOSIS — J3081 Allergic rhinitis due to animal (cat) (dog) hair and dander: Secondary | ICD-10-CM | POA: Diagnosis not present

## 2020-05-15 DIAGNOSIS — J301 Allergic rhinitis due to pollen: Secondary | ICD-10-CM | POA: Diagnosis not present

## 2020-05-15 DIAGNOSIS — J3089 Other allergic rhinitis: Secondary | ICD-10-CM | POA: Diagnosis not present

## 2020-05-25 ENCOUNTER — Ambulatory Visit (INDEPENDENT_AMBULATORY_CARE_PROVIDER_SITE_OTHER): Payer: PPO

## 2020-05-25 ENCOUNTER — Other Ambulatory Visit: Payer: Self-pay

## 2020-05-25 DIAGNOSIS — Z23 Encounter for immunization: Secondary | ICD-10-CM

## 2020-05-25 DIAGNOSIS — H40023 Open angle with borderline findings, high risk, bilateral: Secondary | ICD-10-CM | POA: Diagnosis not present

## 2020-05-25 DIAGNOSIS — H11423 Conjunctival edema, bilateral: Secondary | ICD-10-CM | POA: Diagnosis not present

## 2020-05-25 DIAGNOSIS — H04123 Dry eye syndrome of bilateral lacrimal glands: Secondary | ICD-10-CM | POA: Diagnosis not present

## 2020-05-25 LAB — HM DIABETES EYE EXAM

## 2020-05-29 DIAGNOSIS — J3089 Other allergic rhinitis: Secondary | ICD-10-CM | POA: Diagnosis not present

## 2020-05-29 DIAGNOSIS — J3081 Allergic rhinitis due to animal (cat) (dog) hair and dander: Secondary | ICD-10-CM | POA: Diagnosis not present

## 2020-05-29 DIAGNOSIS — J301 Allergic rhinitis due to pollen: Secondary | ICD-10-CM | POA: Diagnosis not present

## 2020-06-04 ENCOUNTER — Other Ambulatory Visit: Payer: Self-pay | Admitting: Nurse Practitioner

## 2020-06-04 ENCOUNTER — Encounter: Payer: Self-pay | Admitting: Emergency Medicine

## 2020-06-04 ENCOUNTER — Ambulatory Visit (INDEPENDENT_AMBULATORY_CARE_PROVIDER_SITE_OTHER): Payer: PPO

## 2020-06-04 ENCOUNTER — Other Ambulatory Visit: Payer: Self-pay

## 2020-06-04 ENCOUNTER — Ambulatory Visit: Payer: PPO | Admitting: Emergency Medicine

## 2020-06-04 VITALS — BP 150/80 | HR 101 | Temp 97.3°F | Ht 64.0 in | Wt 227.0 lb

## 2020-06-04 DIAGNOSIS — R053 Chronic cough: Secondary | ICD-10-CM | POA: Diagnosis not present

## 2020-06-04 DIAGNOSIS — J90 Pleural effusion, not elsewhere classified: Secondary | ICD-10-CM | POA: Diagnosis not present

## 2020-06-04 DIAGNOSIS — R059 Cough, unspecified: Secondary | ICD-10-CM | POA: Diagnosis not present

## 2020-06-04 MED ORDER — SPIRIVA RESPIMAT 2.5 MCG/ACT IN AERS
2.0000 | INHALATION_SPRAY | Freq: Every day | RESPIRATORY_TRACT | 0 refills | Status: DC
Start: 2020-06-04 — End: 2020-08-19

## 2020-06-04 NOTE — Progress Notes (Signed)
Subjective:    Patient ID: Wendy Figueroa, female    DOB: 03/12/1946, 74 y.o.   MRN: [id number]   HPI 74 year old woman, never smoker, previously followed by Dr. [name] in our office for asthma and obstructive lung disease.  She has chronic allergic rhinitis, GERD with esophageal narrowing requiring dilation in the past, hypertension DJD, pulmonary embolism in 03/2017.  She has longstanding chronic cough.  She has been followed by ENT most recently Dr. Constance Holster.  Prior examinations have shown pharyngeal esophageal dysphagia with dysphonia.  She is tried Botox injections, esophageal dilatation, Neurontin with some intermittent improvement with treatment. On Symbicort, Spiriva handihaler. Gets immunotherapy with Dr Donneta Romberg. She continues to have a lot of cough, has had some better days since she cut down on caffeine, chocolate, etc. She does feel some breakthrough reflux. Non-productive, often at night. Occasional globus sensation, not always.  Zyrtec, Xyzal, Singulair, fluticasone nasal spray, Protonix twice daily.  Uses Tussionex as needed - about 2x a week.  She is on fish oil and losartan. She does not have much nasal gtt on the regimen above.   Most recent chest x-ray was 10/11/2017, no active disease.    Review of Systems As per HPI  Past Medical History:  Diagnosis Date  . Abnormal Pap smear    Over 18yrs ago  . ALKALINE PHOSPHATASE, ELEVATED 07/01/2009   Qualifier: Diagnosis of  By: [name] MD, [name]   . Allergic rhinitis   . Anemia   . Arthritis   . Asthma   . Cataract    bilateral implants  . Cough    occasional; 9-21: reports as ongoing cough , managing with cough medicine and scheduled and prn inhalers, reports mucus is clear and scant    . Cystocele 11/05/08  . Diverticulosis of colon   . DJD (degenerative joint disease)    no per pt  . Elevated alkaline phosphatase level   . GERD (gastroesophageal reflux disease)   . H/O urinary incontinence 11/06/2008  . H/O varicella     . Hard measles   . Hx of colonic polyps   . Hypercholesterolemia   . Hypertension   . Leg swelling    occasional  . 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 Overweight(278.02)   . Rectocele 11/06/2008   Large  . Sebaceous cyst    on back  . Sinusitis   . Venous insufficiency   . Wheezing    occasional  . Yeast infection      Family History  Problem Relation Age of Onset  . Aneurysm Mother   . Lung cancer Father   . Hypertension Daughter   . Hypertension Daughter   . Colon cancer Neg Hx   . Esophageal cancer Neg Hx   . Stomach cancer Neg Hx   . Rectal cancer Neg Hx   . Colon polyps Neg Hx      Social History   Socioeconomic History  . Marital status: Married    Spouse name: [name]  . Number of children: 2  . Years of education: Not on file  . Highest education level: Not on file  Occupational History  . Occupation: LPN   Tobacco Use  . Smoking status: Never Smoker  . Smokeless tobacco: Never Used  Vaping Use  . Vaping Use: Never used  Substance and Sexual Activity  . Alcohol use: Yes    Alcohol/week: 2.0 standard drinks    Types: 2 Cans of beer per week    Comment:  2 beers on the weekend  . Drug use: No  . Sexual activity: Not Currently    Partners: Male    Birth control/protection: None  Other Topics Concern  . Not on file  Social History Narrative   Married   Never smoked   Alcohol few beers on week-end   No Advance Directive    Social Determinants of Health   Financial Resource Strain:   . Difficulty of Paying Living Expenses: Not on file  Food Insecurity:   . Worried About Charity fundraiser in the Last Year: Not on file  . Ran Out of Food in the Last Year: Not on file  Transportation Needs:   . Lack of Transportation (Medical): Not on file  . Lack of Transportation (Non-Medical): Not on file  Physical Activity:   . Days of Exercise per Week: Not on file  . Minutes of Exercise per Session: Not on file  Stress:   . Feeling  of Stress : Not on file  Social Connections:   . Frequency of Communication with Friends and Family: Not on file  . Frequency of Social Gatherings with Friends and Family: Not on file  . Attends Religious Services: Not on file  . Active Member of Clubs or Organizations: Not on file  . Attends Archivist Meetings: Not on file  . Marital Status: Not on file  Intimate Partner Violence:   . Fear of Current or Ex-Partner: Not on file  . Emotionally Abused: Not on file  . Physically Abused: Not on file  . Sexually Abused: Not on file     Allergies  Allergen Reactions  . Demerol Hives    All over the body   . Lisinopril-Hydrochlorothiazide Hives    All over the body  . Penicillins Hives    Has patient had a PCN reaction causing immediate rash, facial/tongue/throat swelling, SOB or lightheadedness with hypotension: No Has patient had a PCN reaction causing severe rash involving mucus membranes or skin necrosis: No Has patient had a PCN reaction that required hospitalization: No Has patient had a PCN reaction occurring within the last 10 years: No If all of the above answers are "NO", then may proceed with Cephalosporin use.   All over the body     Outpatient Medications Prior to Visit  Medication Sig Dispense Refill  . albuterol (PROVENTIL HFA;VENTOLIN HFA) 108 (90 Base) MCG/ACT inhaler Inhale 2 puffs into the lungs every 6 (six) hours as needed for wheezing or shortness of breath.    . budesonide-formoterol (SYMBICORT) 160-4.5 MCG/ACT inhaler Inhale 2 puffs into the lungs 2 (two) times daily. 2 Inhaler 0  . Calcium Carb-Cholecalciferol (CALCIUM 600 + D PO) Take 1 tablet by mouth 2 (two) times daily.     . cetirizine (ZYRTEC) 10 MG tablet Take 10 mg by mouth daily.    . chlorpheniramine-HYDROcodone (TUSSIONEX PENNKINETIC ER) 10-8 MG/5ML SUER Take 5 mLs by mouth every 12 (twelve) hours as needed for cough. 300 mL 0  . DULoxetine (CYMBALTA) 60 MG capsule TAKE 1 CAPSULE (60 MG  TOTAL) BY MOUTH DAILY. DX (F43.22) 90 capsule 1  . EPINEPHrine 0.3 mg/0.3 mL IJ SOAJ injection epinephrine 0.3 mg/0.3 mL injection, auto-injector    . fluticasone (FLONASE) 50 MCG/ACT nasal spray 2 sprays daily.  3  . furosemide (LASIX) 40 MG tablet TAKE 1 TABLET BY MOUTH EVERY DAY 90 tablet 1  . levocetirizine (XYZAL) 5 MG tablet Take 1 tablet (5 mg total) by mouth every evening.  90 tablet 3  . losartan (COZAAR) 25 MG tablet TAKE 3 TABLETS BY MOUTH EVERY DAY 270 tablet 1  . montelukast (SINGULAIR) 10 MG tablet Take 10 mg by mouth at bedtime.      . Multiple Vitamins-Minerals (MACULAR VITAMIN BENEFIT PO) Take 1 capsule by mouth 2 (two) times daily.      Marland Kitchen neomycin-polymyxin b-dexamethasone (MAXITROL) 3.5-10000-0.1 OINT Place 3.5 application into the left eye as needed.    Marland Kitchen NIFEdipine (PROCARDIA XL/NIFEDICAL XL) 60 MG 24 hr tablet TAKE 1 TABLET BY MOUTH EVERY DAY 90 tablet 3  . NON FORMULARY Allergy shots once every other week    . Omega-3 Fatty Acids (FISH OIL PO) Take by mouth daily.    Marland Kitchen oxybutynin (DITROPAN) 5 MG tablet oxybutynin chloride 5 mg tablet  TAKE 1 TABLET BY MOUTH TWICE A DAY    . pantoprazole (PROTONIX) 40 MG tablet TAKE 1 TABLET (40 MG TOTAL) BY MOUTH 2 (TWO) TIMES DAILY. FOR STOMACH 180 tablet 2  . potassium chloride SA (KLOR-CON M20) 20 MEQ tablet TAKE TWO TABLETS BY MOUTH ONCE DAILY FOR POTASSIUM SUPPLEMENT 180 tablet 1  . pravastatin (PRAVACHOL) 40 MG tablet TAKE 1 TABLET BY MOUTH EVERY DAY 90 tablet 1  . psyllium (METAMUCIL) 58.6 % packet Take 1 packet by mouth daily as needed (constipation).    . SPIRIVA HANDIHALER 18 MCG inhalation capsule Place 18 mcg into inhaler and inhale daily.   6  . SYSTANE ULTRA 0.4-0.3 % SOLN SMARTSIG:1 Drop(s) In Eye(s) As Needed    . vitamin B-12 (CYANOCOBALAMIN) 1000 MCG tablet Take 1,000 mcg by mouth daily.     Facility-Administered Medications Prior to Visit  Medication Dose Route Frequency Provider Last Rate Last Admin  . 0.9 %  sodium  chloride infusion  500 mL Intravenous Once Doran Stabler, MD             Objective:   Physical Exam Vitals:   06/04/20 1603  BP: (!) 150/80  Pulse: (!) 101  Temp: (!) 97.3 F (36.3 C)  TempSrc: Temporal  SpO2: 98%  Weight: 227 lb (103 kg)  Height: 5\' 4"  (1.626 m)   Gen: Pleasant, obese woman, in no distress,  normal affect  ENT: No lesions,  mouth clear,  oropharynx clear, no postnasal drip. Cough is quiet during our visit, no throat clearing  Neck: No JVD, no stridor, strong voice.   Lungs: No use of accessory muscles, no crackles or wheezing on normal respiration, no wheeze on forced expiration  Cardiovascular: RRR, heart sounds normal, no murmur or gallops, trace peripheral edema  Musculoskeletal: No deformities, no cyanosis or clubbing  Neuro: alert, awake, non focal  Skin: Warm, no lesions or rash      Assessment & Plan:  Chronic cough Longstanding with probably influence from chronic gastroesophageal and laryngeal pharyngeal reflux, chronic allergic rhinitis (which appears to be fairly well controlled).  She has had multiple interventions including laryngoscopy, Botox therapy, Neurontin with mixed results.  The Botox may have helped her the first time it was done.  She needs a chest x-ray today to ensure no new process that could be contributing to cough.  There may be other changes that we can make that might influence the cough some although doubt that these would stop the cough altogether.  For example she may benefit from stopping her fish oil as a contributor to her reflux, changing her powdered Spiriva to the Spiriva Respimat.  Sometimes losartan has properties that are  akin to ACE inhibitor's including cough.  We can consider changing her to another ARB going forward.  Because of the longstanding nature of her cough I doubt that any of these maneuvers will stop it altogether.  If it continues to persist then we may want to consider an airway inspection and  bronchoscopy.  I will follow up with her in 1 month to see how much these interventions have helped.  Chest x-ray today Temporarily stop your Spiriva HandiHaler.  We will try starting Spiriva Respimat 2 puffs once daily. Continue Symbicort 2 puffs twice a day.  Rinse and gargle after you use this medication. Continue your allergy shots, Zyrtec, Xyzal, Singulair, fluticasone nasal spray as you have been doing them. Continue Protonix 40 mg twice a day.  Take this medication 1 hour before eating. Stop your fish oil for now. We could consider changing your losartan at some point in the future.  For now we will continue it. Continue to follow with ENT.  Adhere to your reflux avoidance diet.  Sleep with your head of bed elevated. Try to avoid throat clearing if at all possible Follow with Dr Lamonte Sakai in 1 month or next available.  Baltazar Apo, MD, PhD 06/04/2020, 4:49 PM [facility] [phone] or if no answer [phone]

## 2020-06-04 NOTE — Assessment & Plan Note (Signed)
Longstanding with probably influence from chronic gastroesophageal and laryngeal pharyngeal reflux, chronic allergic rhinitis (which appears to be fairly well controlled).  She has had multiple interventions including laryngoscopy, Botox therapy, Neurontin with mixed results.  The Botox may have helped her the first time it was done.  She needs a chest x-ray today to ensure no new process that could be contributing to cough.  There may be other changes that we can make that might influence the cough some although doubt that these would stop the cough altogether.  For example she may benefit from stopping her fish oil as a contributor to her reflux, changing her powdered Spiriva to the Spiriva Respimat.  Sometimes losartan has properties that are akin to ACE inhibitor's including cough.  We can consider changing her to another ARB going forward.  Because of the longstanding nature of her cough I doubt that any of these maneuvers will stop it altogether.  If it continues to persist then we may want to consider an airway inspection and bronchoscopy.  I will follow up with her in 1 month to see how much these interventions have helped.  Chest x-ray today Temporarily stop your Spiriva HandiHaler.  We will try starting Spiriva Respimat 2 puffs once daily. Continue Symbicort 2 puffs twice a day.  Rinse and gargle after you use this medication. Continue your allergy shots, Zyrtec, Xyzal, Singulair, fluticasone nasal spray as you have been doing them. Continue Protonix 40 mg twice a day.  Take this medication 1 hour before eating. Stop your fish oil for now. We could consider changing your losartan at some point in the future.  For now we will continue it. Continue to follow with ENT.  Adhere to your reflux avoidance diet.  Sleep with your head of bed elevated. Try to avoid throat clearing if at all possible Follow with Dr Lamonte Sakai in 1 month or next available.

## 2020-06-04 NOTE — Patient Instructions (Addendum)
Chest x-ray today Temporarily stop your Spiriva HandiHaler.  We will try starting Spiriva Respimat 2 puffs once daily. Continue Symbicort 2 puffs twice a day.  Rinse and gargle after you use this medication. Continue your allergy shots, Zyrtec, Xyzal, Singulair, fluticasone nasal spray as you have been doing them. Continue Protonix 40 mg twice a day.  Take this medication 1 hour before eating. Stop your fish oil for now. We could consider changing your losartan at some point in the future.  For now we will continue it. Continue to follow with ENT.  Adhere to your reflux avoidance diet.  Sleep with your head of bed elevated. Try to avoid throat clearing if at all possible Follow with Wendy Figueroa in 1 month or next available.

## 2020-06-12 DIAGNOSIS — J3081 Allergic rhinitis due to animal (cat) (dog) hair and dander: Secondary | ICD-10-CM | POA: Diagnosis not present

## 2020-06-12 DIAGNOSIS — J301 Allergic rhinitis due to pollen: Secondary | ICD-10-CM | POA: Diagnosis not present

## 2020-06-12 DIAGNOSIS — J3089 Other allergic rhinitis: Secondary | ICD-10-CM | POA: Diagnosis not present

## 2020-06-15 DIAGNOSIS — J3081 Allergic rhinitis due to animal (cat) (dog) hair and dander: Secondary | ICD-10-CM | POA: Diagnosis not present

## 2020-06-15 DIAGNOSIS — J301 Allergic rhinitis due to pollen: Secondary | ICD-10-CM | POA: Diagnosis not present

## 2020-06-15 DIAGNOSIS — J3089 Other allergic rhinitis: Secondary | ICD-10-CM | POA: Diagnosis not present

## 2020-06-25 ENCOUNTER — Other Ambulatory Visit: Payer: Self-pay | Admitting: Nurse Practitioner

## 2020-06-25 DIAGNOSIS — F4322 Adjustment disorder with anxiety: Secondary | ICD-10-CM

## 2020-06-26 DIAGNOSIS — J301 Allergic rhinitis due to pollen: Secondary | ICD-10-CM | POA: Diagnosis not present

## 2020-06-26 DIAGNOSIS — J3089 Other allergic rhinitis: Secondary | ICD-10-CM | POA: Diagnosis not present

## 2020-06-26 DIAGNOSIS — J3081 Allergic rhinitis due to animal (cat) (dog) hair and dander: Secondary | ICD-10-CM | POA: Diagnosis not present

## 2020-06-30 DIAGNOSIS — J3081 Allergic rhinitis due to animal (cat) (dog) hair and dander: Secondary | ICD-10-CM | POA: Diagnosis not present

## 2020-06-30 DIAGNOSIS — J301 Allergic rhinitis due to pollen: Secondary | ICD-10-CM | POA: Diagnosis not present

## 2020-06-30 DIAGNOSIS — J3089 Other allergic rhinitis: Secondary | ICD-10-CM | POA: Diagnosis not present

## 2020-07-03 DIAGNOSIS — J3081 Allergic rhinitis due to animal (cat) (dog) hair and dander: Secondary | ICD-10-CM | POA: Diagnosis not present

## 2020-07-03 DIAGNOSIS — J3089 Other allergic rhinitis: Secondary | ICD-10-CM | POA: Diagnosis not present

## 2020-07-03 DIAGNOSIS — J301 Allergic rhinitis due to pollen: Secondary | ICD-10-CM | POA: Diagnosis not present

## 2020-07-07 DIAGNOSIS — J301 Allergic rhinitis due to pollen: Secondary | ICD-10-CM | POA: Diagnosis not present

## 2020-07-07 DIAGNOSIS — J3089 Other allergic rhinitis: Secondary | ICD-10-CM | POA: Diagnosis not present

## 2020-07-07 DIAGNOSIS — J3081 Allergic rhinitis due to animal (cat) (dog) hair and dander: Secondary | ICD-10-CM | POA: Diagnosis not present

## 2020-07-13 ENCOUNTER — Encounter: Payer: Self-pay | Admitting: Emergency Medicine

## 2020-07-13 ENCOUNTER — Ambulatory Visit: Payer: PPO | Admitting: Emergency Medicine

## 2020-07-13 ENCOUNTER — Other Ambulatory Visit: Payer: Self-pay

## 2020-07-13 DIAGNOSIS — J301 Allergic rhinitis due to pollen: Secondary | ICD-10-CM | POA: Diagnosis not present

## 2020-07-13 DIAGNOSIS — K219 Gastro-esophageal reflux disease without esophagitis: Secondary | ICD-10-CM | POA: Diagnosis not present

## 2020-07-13 DIAGNOSIS — J849 Interstitial pulmonary disease, unspecified: Secondary | ICD-10-CM | POA: Insufficient documentation

## 2020-07-13 DIAGNOSIS — J3089 Other allergic rhinitis: Secondary | ICD-10-CM | POA: Diagnosis not present

## 2020-07-13 DIAGNOSIS — J4520 Mild intermittent asthma, uncomplicated: Secondary | ICD-10-CM | POA: Diagnosis not present

## 2020-07-13 DIAGNOSIS — R053 Chronic cough: Secondary | ICD-10-CM

## 2020-07-13 DIAGNOSIS — J3081 Allergic rhinitis due to animal (cat) (dog) hair and dander: Secondary | ICD-10-CM | POA: Diagnosis not present

## 2020-07-13 NOTE — Addendum Note (Signed)
Addended by: Gavin Potters R on: 07/13/2020 03:17 PM   Modules accepted: Orders

## 2020-07-13 NOTE — Assessment & Plan Note (Signed)
Appears to be principally associated with reflux and possible dysphagia, aspiration.  Plan work-up as above.

## 2020-07-13 NOTE — Patient Instructions (Addendum)
We will arrange for a modified swallowing test to evaluate for aspiration and reflux when you eat and drink. We will arrange for a CT scan of your chest to evaluate for lung inflammation, possibly from aspiration We will refer you to see Dr. Loletha Carrow with gastroenterology after your swallowing testing to work with [place] on possible connection between aspiration, reflux and your chronic cough Continue your Symbicort 2 puffs twice a day.  Rinse and gargle after using. Go back to your usual Spiriva HandiHaler 1 inhalation once daily. Stay off the fish oil for now Continue your reflux prevention diet Avoid throat clearing if at all possible Follow with Dr Lamonte Sakai in 1 month or next available

## 2020-07-13 NOTE — Assessment & Plan Note (Signed)
Progressive interstitial markings on her chest x-ray.  I think she needs a CT scan of the chest, high-resolution cuts.  This could relate to or cause cough.  I suspect instead it is downstream effect of chronic intermittent aspiration

## 2020-07-13 NOTE — Assessment & Plan Note (Signed)
Severe disease, has been worked on in detail before with gastroenterology and also ENT, speech therapy at [facility].  She is now having aggressive overt dysphagia associated with cough.  She has sensation of reflux up into her throat that triggers cough.  We will arrange for a modified swallowing test to evaluate for aspiration and reflux when you eat and drink. We will refer you to see Dr. Loletha Carrow with gastroenterology after your swallowing testing to work with [place] on possible connection between aspiration, reflux and your chronic cough Stay off the fish oil for now Continue your reflux prevention diet Avoid throat clearing if at all possible Follow with Dr Lamonte Sakai in 1 month or next available

## 2020-07-13 NOTE — Assessment & Plan Note (Signed)
Continue Symbicort and Spiriva for now, change back to her usual HandiHaler.  We may be able to stop the Spiriva altogether at some point going forward.

## 2020-07-13 NOTE — Progress Notes (Signed)
Subjective:    Patient ID: Wendy Figueroa, female    DOB: 1945-12-10, 74 y.o.   MRN: [id number]   HPI 74 year old woman, never smoker, previously followed by Dr. [name] in our office for asthma and obstructive lung disease.  She has chronic allergic rhinitis, GERD with esophageal narrowing requiring dilation in the past, hypertension DJD, pulmonary embolism in 03/2017.  She has longstanding chronic cough.  She has been followed by ENT most recently Dr. Constance Holster.  Prior examinations have shown pharyngeal esophageal dysphagia with dysphonia.  She is tried Botox injections, esophageal dilatation, Neurontin in [place] with some intermittent improvement with treatment. On Symbicort, Spiriva handihaler. Gets immunotherapy with Dr Donneta Romberg. She continues to have a lot of cough, has had some better days since she cut down on caffeine, chocolate, etc. She does feel some breakthrough reflux. Non-productive, often at night. Occasional globus sensation, not always.  Zyrtec, Xyzal, Singulair, fluticasone nasal spray, Protonix twice daily.  Uses Tussionex as needed - about 2x a week.  She is on fish oil and losartan. She does not have much nasal gtt on the regimen above.   Most recent chest x-ray was 10/11/2017, no active disease.  ROV 07/13/20 --74 year old woman who follows up today for chronic longstanding cough.  She has known upper airway instability with prior Botox treatment, has failed multiple interventions.  She has a history of asthma and is on Symbicort and Spiriva.  I changed her to Spiriva Respimat to try to avoid the powder formulation as it upper airway irritant.  She is on an aggressive allergy regimen including immunotherapy, Protonix twice a day.  I stopped her fish oil to see if she would get benefit.  Chest x-ray done on 06/04/2020 showed some increased bilateral pulmonary interstitial markings, unclear etiology, consider edema versus evolving interstitial inflammation. She has noticed an increased since  last time in episodes of "getting strangled" with food. Makes her cough. The cough is worse when she drinks something afterwards - coughing certainly won't stop the cough. The change in Spiriva didn't change much. The cough can happen at other times - feels that something comes up in her throat.   MDM: Reviewed notes from ENT, gastroenterology  Review of Systems As per HPI       Objective:   Physical Exam Vitals:   07/13/20 1440  BP: 134/82  Pulse: 95  Temp: 97.7 F (36.5 C)  SpO2: 99%  Weight: 227 lb 12.8 oz (103.3 kg)  Height: 5\' 4"  (1.626 m)   Gen: Pleasant, obese woman, in no distress,  normal affect  ENT: No lesions,  mouth clear,  oropharynx clear, no postnasal drip. Cough is quiet during our visit, no throat clearing  Neck: No JVD, no stridor, strong voice.   Lungs: No use of accessory muscles, no crackles or wheezing on normal respiration, no wheeze on forced expiration  Cardiovascular: RRR, heart sounds normal, no murmur or gallops, trace peripheral edema  Musculoskeletal: No deformities, no cyanosis or clubbing  Neuro: alert, awake, non focal  Skin: Warm, no lesions or rash      Assessment & Plan:  LPRD (laryngopharyngeal reflux disease) Severe disease, has been worked on in detail before with gastroenterology and also ENT, speech therapy at [facility].  She is now having aggressive overt dysphagia associated with cough.  She has sensation of reflux up into her throat that triggers cough.  We will arrange for a modified swallowing test to evaluate for aspiration and reflux when you eat and  drink. We will refer you to see Dr. Loletha Carrow with gastroenterology after your swallowing testing to work with [place] on possible connection between aspiration, reflux and your chronic cough Stay off the fish oil for now Continue your reflux prevention diet Avoid throat clearing if at all possible Follow with Dr Lamonte Sakai in 1 month or next available  Chronic cough Appears to be  principally associated with reflux and possible dysphagia, aspiration.  Plan work-up as above.  ILD (interstitial lung disease) ([place]) Progressive interstitial markings on her chest x-ray.  I think she needs a CT scan of the chest, high-resolution cuts.  This could relate to or cause cough.  I suspect instead it is downstream effect of chronic intermittent aspiration  Asthma, mild intermittent Continue Symbicort and Spiriva for now, change back to her usual HandiHaler.  We may be able to stop the Spiriva altogether at some point going forward.  Baltazar Apo, MD, PhD 07/13/2020, 3:11 PM [facility] [phone] or if no answer [phone]

## 2020-07-15 ENCOUNTER — Other Ambulatory Visit (HOSPITAL_COMMUNITY): Payer: Self-pay | Admitting: *Deleted

## 2020-07-15 DIAGNOSIS — R1310 Dysphagia, unspecified: Secondary | ICD-10-CM

## 2020-07-20 ENCOUNTER — Ambulatory Visit (HOSPITAL_COMMUNITY)
Admission: RE | Admit: 2020-07-20 | Discharge: 2020-07-20 | Disposition: A | Discharge status: Home / Self Care | Payer: PPO | Source: Ambulatory Visit | Attending: Emergency Medicine | Admitting: Emergency Medicine

## 2020-07-20 ENCOUNTER — Other Ambulatory Visit: Payer: Self-pay

## 2020-07-20 DIAGNOSIS — R1310 Dysphagia, unspecified: Secondary | ICD-10-CM | POA: Insufficient documentation

## 2020-07-20 DIAGNOSIS — K219 Gastro-esophageal reflux disease without esophagitis: Secondary | ICD-10-CM

## 2020-07-20 DIAGNOSIS — R059 Cough, unspecified: Secondary | ICD-10-CM | POA: Diagnosis not present

## 2020-07-20 NOTE — Progress Notes (Signed)
Modified Barium Swallow Progress Note  Patient Details  Name: Wendy Figueroa MRN: [id number] Date of Birth: 04-07-1946  Today's Date: 07/20/2020  Modified Barium Swallow completed.  Full report located under Chart Review in the Imaging Section.  Brief recommendations include the following:  Clinical Impression  Pt's oropharyngeal exectution in regards to motor strength, timing and sensation was within functional limits. One instance of flash penetration which is completely within normal range. She propelled barium pill with several tirals thin to posterior oropharynx. Pill stopped mid esophagus needing addtional time to reach GE junction and stopped. Puree barium facilitated pill through GE juncture. Therapist reviewed results of video and impression and prior diagnosis of esophageal dysmotility and GERD as suspected etiology of symptoms. Reviewed (like last year) strategies to mitigate GER symptoms. Pt voiced understanding and has been following these.         Swallow Evaluation Recommendations       SLP Diet Recommendations: Regular solids;Thin liquid   Liquid Administration via: Straw;Cup   Medication Administration: Whole meds with liquid   Supervision: Patient able to self feed       Postural Changes: Seated upright at 90 degrees;Remain semi-upright after after feeds/meals (Comment) [place] Mon.Ed Actor Pager [phone] Office [phone]   Oral Care Recommendations: Oral care BID        Houston Siren 07/20/2020,2:59 PM

## 2020-07-24 ENCOUNTER — Other Ambulatory Visit: Payer: Self-pay | Admitting: Nurse Practitioner

## 2020-07-29 ENCOUNTER — Ambulatory Visit (INDEPENDENT_AMBULATORY_CARE_PROVIDER_SITE_OTHER)
Admission: RE | Admit: 2020-07-29 | Discharge: 2020-07-29 | Disposition: A | Discharge status: Home / Self Care | Payer: PPO | Source: Ambulatory Visit | Attending: Emergency Medicine | Admitting: Emergency Medicine

## 2020-07-29 ENCOUNTER — Other Ambulatory Visit: Payer: Self-pay

## 2020-07-29 DIAGNOSIS — R059 Cough, unspecified: Secondary | ICD-10-CM | POA: Diagnosis not present

## 2020-07-29 DIAGNOSIS — J849 Interstitial pulmonary disease, unspecified: Secondary | ICD-10-CM | POA: Diagnosis not present

## 2020-07-31 DIAGNOSIS — J3089 Other allergic rhinitis: Secondary | ICD-10-CM | POA: Diagnosis not present

## 2020-07-31 DIAGNOSIS — J301 Allergic rhinitis due to pollen: Secondary | ICD-10-CM | POA: Diagnosis not present

## 2020-07-31 DIAGNOSIS — J3081 Allergic rhinitis due to animal (cat) (dog) hair and dander: Secondary | ICD-10-CM | POA: Diagnosis not present

## 2020-08-07 ENCOUNTER — Ambulatory Visit: Payer: PPO | Admitting: Podiatry

## 2020-08-07 ENCOUNTER — Other Ambulatory Visit: Payer: Self-pay

## 2020-08-07 ENCOUNTER — Encounter: Payer: Self-pay | Admitting: Podiatry

## 2020-08-07 DIAGNOSIS — M79675 Pain in left toe(s): Secondary | ICD-10-CM

## 2020-08-07 DIAGNOSIS — M79674 Pain in right toe(s): Secondary | ICD-10-CM | POA: Diagnosis not present

## 2020-08-07 DIAGNOSIS — I872 Venous insufficiency (chronic) (peripheral): Secondary | ICD-10-CM | POA: Diagnosis not present

## 2020-08-07 DIAGNOSIS — B351 Tinea unguium: Secondary | ICD-10-CM | POA: Diagnosis not present

## 2020-08-07 NOTE — Progress Notes (Signed)
This patient returns to my office for at risk foot care.  This patient requires this care by a professional since this patient will be at risk due to having venous insufficiency  This patient is unable to cut nails herself since the patient cannot reach her nails.These nails are painful walking and wearing shoes.  This patient presents for at risk foot care today.  General Appearance  Alert, conversant and in no acute stress.  Vascular  Dorsalis pedis and posterior tibial  pulses are palpable  bilaterally.  Capillary return is within normal limits  bilaterally. Temperature is within normal limits  bilaterally.  Neurologic  Senn-Weinstein monofilament wire test within normal limits  bilaterally. Muscle power within normal limits bilaterally.  Nails Thick disfigured discolored nails with subungual debris  from hallux to fifth toes bilaterally. No evidence of bacterial infection or drainage bilaterally.  Orthopedic  No limitations of motion  feet .  No crepitus or effusions noted.  HAV with hammer toes  B/L.  Skin  normotropic skin with no porokeratosis noted bilaterally.  No signs of infections or ulcers noted.     Onychomycosis  Pain in right toes  Pain in left toes  Consent was obtained for treatment procedures.   Mechanical debridement of nails 1-5  bilaterally performed with a nail nipper.  Filed with dremel without incident. No infection or ulcer.     Return office visit    12 weeks                Told patient to return for periodic foot care and evaluation due to potential at risk complications.   Gearldene Fiorenza DPM  

## 2020-08-09 ENCOUNTER — Other Ambulatory Visit: Payer: Self-pay | Admitting: Family

## 2020-08-09 ENCOUNTER — Other Ambulatory Visit: Payer: Self-pay | Admitting: Nurse Practitioner

## 2020-08-12 ENCOUNTER — Ambulatory Visit: Payer: PPO | Admitting: Emergency Medicine

## 2020-08-12 ENCOUNTER — Other Ambulatory Visit: Payer: Self-pay

## 2020-08-12 ENCOUNTER — Other Ambulatory Visit: Payer: PPO

## 2020-08-12 DIAGNOSIS — Z1159 Encounter for screening for other viral diseases: Secondary | ICD-10-CM

## 2020-08-12 DIAGNOSIS — E785 Hyperlipidemia, unspecified: Secondary | ICD-10-CM

## 2020-08-12 DIAGNOSIS — I10 Essential (primary) hypertension: Secondary | ICD-10-CM

## 2020-08-12 DIAGNOSIS — R739 Hyperglycemia, unspecified: Secondary | ICD-10-CM | POA: Diagnosis not present

## 2020-08-14 LAB — HEPATITIS C ANTIBODY
Hepatitis C Ab: NONREACTIVE
SIGNAL TO CUT-OFF: 0.01 (ref <1.00)

## 2020-08-14 LAB — CBC WITH DIFFERENTIAL/PLATELET
Absolute Monocytes: 453 cells/uL (ref 200–950)
Basophils Absolute: 50 cells/uL (ref 0–200)
Basophils Relative: 0.8 %
Eosinophils Absolute: 143 cells/uL (ref 15–500)
Eosinophils Relative: 2.3 %
HCT: 36 % (ref 35.0–45.0)
Hemoglobin: 11.8 g/dL (ref 11.7–15.5)
Lymphs Abs: 2499 cells/uL (ref 850–3900)
MCH: 27.1 pg (ref 27.0–33.0)
MCHC: 32.8 g/dL (ref 32.0–36.0)
MCV: 82.6 fL (ref 80.0–100.0)
MPV: 11.5 fL (ref 7.5–12.5)
Monocytes Relative: 7.3 %
Neutro Abs: 3057 cells/uL (ref 1500–7800)
Neutrophils Relative %: 49.3 %
Platelets: 272 10*3/uL (ref 140–400)
RBC: 4.36 10*6/uL (ref 3.80–5.10)
RDW: 13.7 % (ref 11.0–15.0)
Total Lymphocyte: 40.3 %
WBC: 6.2 10*3/uL (ref 3.8–10.8)

## 2020-08-14 LAB — COMPLETE METABOLIC PANEL WITH GFR
AG Ratio: 1.2 (calc) (ref 1.0–2.5)
ALT: 15 U/L (ref 6–29)
AST: 20 U/L (ref 10–35)
Albumin: 4.2 g/dL (ref 3.6–5.1)
Alkaline phosphatase (APISO): 156 U/L — ABNORMAL HIGH (ref 37–153)
BUN: 14 mg/dL (ref 7–25)
CO2: 26 mmol/L (ref 20–32)
Calcium: 9.5 mg/dL (ref 8.6–10.4)
Chloride: 109 mmol/L (ref 98–110)
Creat: 0.86 mg/dL (ref 0.60–0.93)
GFR, Est African American: 76 mL/min/{1.73_m2} (ref >=60)
GFR, Est Non African American: 66 mL/min/{1.73_m2} (ref >=60)
Globulin: 3.5 g/dL (calc) (ref 1.9–3.7)
Glucose, Bld: 113 mg/dL — ABNORMAL HIGH (ref 65–99)
Potassium: 3.9 mmol/L (ref 3.5–5.3)
Sodium: 142 mmol/L (ref 135–146)
Total Bilirubin: 0.5 mg/dL (ref 0.2–1.2)
Total Protein: 7.7 g/dL (ref 6.1–8.1)

## 2020-08-14 LAB — LIPID PANEL
Cholesterol: 162 mg/dL (ref <200)
HDL: 57 mg/dL (ref >=50)
LDL Cholesterol (Calc): 86 mg/dL (calc)
Non-HDL Cholesterol (Calc): 105 mg/dL (calc) (ref <130)
Total CHOL/HDL Ratio: 2.8 (calc) (ref <5.0)
Triglycerides: 97 mg/dL (ref <150)

## 2020-08-14 LAB — TEST AUTHORIZATION

## 2020-08-14 LAB — HEMOGLOBIN A1C
Hgb A1c MFr Bld: 5.6 % of total Hgb (ref <5.7)
Mean Plasma Glucose: 114 mg/dL
eAG (mmol/L): 6.3 mmol/L

## 2020-08-18 DIAGNOSIS — J3081 Allergic rhinitis due to animal (cat) (dog) hair and dander: Secondary | ICD-10-CM | POA: Diagnosis not present

## 2020-08-18 DIAGNOSIS — J301 Allergic rhinitis due to pollen: Secondary | ICD-10-CM | POA: Diagnosis not present

## 2020-08-18 DIAGNOSIS — J3089 Other allergic rhinitis: Secondary | ICD-10-CM | POA: Diagnosis not present

## 2020-08-19 ENCOUNTER — Other Ambulatory Visit: Payer: Self-pay

## 2020-08-19 ENCOUNTER — Ambulatory Visit (INDEPENDENT_AMBULATORY_CARE_PROVIDER_SITE_OTHER): Payer: PPO | Admitting: Nurse Practitioner

## 2020-08-19 ENCOUNTER — Encounter: Payer: Self-pay | Admitting: Nurse Practitioner

## 2020-08-19 VITALS — BP 126/78 | HR 84 | Temp 96.9°F | Ht 64.0 in | Wt 226.0 lb

## 2020-08-19 DIAGNOSIS — R053 Chronic cough: Secondary | ICD-10-CM | POA: Diagnosis not present

## 2020-08-19 DIAGNOSIS — I10 Essential (primary) hypertension: Secondary | ICD-10-CM | POA: Diagnosis not present

## 2020-08-19 DIAGNOSIS — J4520 Mild intermittent asthma, uncomplicated: Secondary | ICD-10-CM | POA: Diagnosis not present

## 2020-08-19 DIAGNOSIS — R600 Localized edema: Secondary | ICD-10-CM

## 2020-08-19 DIAGNOSIS — K219 Gastro-esophageal reflux disease without esophagitis: Secondary | ICD-10-CM | POA: Diagnosis not present

## 2020-08-19 DIAGNOSIS — M8580 Other specified disorders of bone density and structure, unspecified site: Secondary | ICD-10-CM

## 2020-08-19 DIAGNOSIS — E785 Hyperlipidemia, unspecified: Secondary | ICD-10-CM | POA: Diagnosis not present

## 2020-08-19 MED ORDER — HYDROCOD POLST-CPM POLST ER 10-8 MG/5ML PO SUER: 5.0000 mL | Freq: Two times a day (BID) | ORAL | 0 refills | Status: DC | PRN

## 2020-08-19 NOTE — Progress Notes (Signed)
Careteam: Patient Care Team: Sharon Seller, NP as PCP - General (Geriatric Medicine) Mateo Flow, MD as Consulting Physician (Ophthalmology)  PLACE OF SERVICE:  [facility]  Advanced Directive information Does Patient Have a Medical Advance Directive?: No, Would patient like information on creating a medical advance directive?: Yes (MAU/Ambulatory/Procedural Areas - Information given)  Allergies  Allergen Reactions  . Atenolol Other (See Comments)  . Lisinopril Other (See Comments)  . Meperidine Hcl Other (See Comments)  . Penicillin G Sodium Other (See Comments)  . Demerol Hives    All over the body   . Lisinopril-Hydrochlorothiazide Hives    All over the body  . Penicillins Hives    Has patient had a PCN reaction causing immediate rash, facial/tongue/throat swelling, SOB or lightheadedness with hypotension: No Has patient had a PCN reaction causing severe rash involving mucus membranes or skin necrosis: No Has patient had a PCN reaction that required hospitalization: No Has patient had a PCN reaction occurring within the last 10 years: No If all of the above answers are "NO", then may proceed with Cephalosporin use.   All over the body    Chief Complaint  Patient presents with  . Medical Management of Chronic Issues    6 month follow-up and review labs (copy printed). Discuss MOST form. Patient plans to get shingrix in the near future      HPI: Patient is a 74 y.o. female for routine follow up.   Chronic cough- continues to see allergies, pulmonary and plans to go to GI as well.   htn- controlled on current on nifedipine and losartan.   Anxiety- doing well. Continues on cymbalta. Feels her time with something.   Hyperlipidemia- low fat diet. Continues on pravastatin.   GERD- continues on protonix   LE edema- on lasix and potassium  Reports she walks for exercise. 20-30 mins 3 times a week.   Osteopenia- continues on cal and vit. d   Review of  Systems:  Review of Systems  Constitutional: Negative for chills, fever and weight loss.  HENT: Negative for tinnitus.   Respiratory: Positive for cough. Negative for sputum production and shortness of breath.   Cardiovascular: Positive for leg swelling. Negative for chest pain and palpitations.  Gastrointestinal: Negative for abdominal pain, constipation, diarrhea and heartburn.  Genitourinary: Negative for dysuria, frequency and urgency.  Musculoskeletal: Negative for back pain, falls, joint pain and myalgias.  Skin: Negative.   Neurological: Negative for dizziness and headaches.  Psychiatric/Behavioral: Negative for depression and memory loss. The patient does not have insomnia.     Past Medical History:  Diagnosis Date  . Abnormal Pap smear    Over 73yrs ago  . ALKALINE PHOSPHATASE, ELEVATED 07/01/2009   Qualifier: Diagnosis of  By: Kriste Basque MD, Lonzo Cloud   . Allergic rhinitis   . Anemia   . Arthritis   . Asthma   . Cataract    bilateral implants  . Cough    occasional; 9-21: reports as ongoing cough , managing with cough medicine and scheduled and prn inhalers, reports mucus is clear and scant    . Cystocele 11/05/08  . Diverticulosis of colon   . DJD (degenerative joint disease)    no per pt  . Elevated alkaline phosphatase level   . GERD (gastroesophageal reflux disease)   . H/O urinary incontinence 11/06/2008  . H/O varicella   . Hard measles   . Hx of colonic polyps   . Hypercholesterolemia   . Hypertension   .  Leg swelling    occasional  . 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 Overweight(278.02)   . Rectocele 11/06/2008   Large  . Sebaceous cyst    on back  . Sinusitis   . Venous insufficiency   . Wheezing    occasional  . Yeast infection    Past Surgical History:  Procedure Laterality Date  . ABDOMINAL HYSTERECTOMY  1988  . ANTERIOR AND POSTERIOR REPAIR  11/05/08  . ANTERIOR AND POSTERIOR REPAIR N/A 05/15/2019   Procedure: ANTERIOR  (CYSTOCELE) AND POSTERIOR REPAIR (RECTOCELE);  Surgeon: Everett Graff, MD;  Location: [facility];  Service: Gynecology;  Laterality: N/A;  . APOGEE / PERIGEE REPAIR  10/2008   Dr. Octavio Manns  . BLADDER SUSPENSION N/A 05/15/2019   Procedure: TRANSVAGINAL TAPE (TVT) PROCEDURE;  Surgeon: Everett Graff, MD;  Location: [facility];  Service: Gynecology;  Laterality: N/A;  . bladder tack  2011  . BREAST SURGERY  1978   cyst removed from both removed  . cataracts   approx 10 years   Dr Johnnye Sima , ctaract with lens placement   . COLONOSCOPY    . CYSTOSCOPY N/A 05/15/2019   Procedure: CYSTOSCOPY;  Surgeon: Everett Graff, MD;  Location: [facility];  Service: Gynecology;  Laterality: N/A;  . ESOPHAGOGASTRODUODENOSCOPY     Social History:   reports that she has never smoked. She has never used smokeless tobacco. She reports current alcohol use of about 2.0 standard drinks of alcohol per week. She reports that she does not use drugs.  Family History  Problem Relation Age of Onset  . Aneurysm Mother   . Lung cancer Father   . Hypertension Daughter   . Hypertension Daughter   . Colon cancer Neg Hx   . Esophageal cancer Neg Hx   . Stomach cancer Neg Hx   . Rectal cancer Neg Hx   . Colon polyps Neg Hx     Medications: Patient's Medications  New Prescriptions   No medications on file  Previous Medications   ALBUTEROL (PROVENTIL HFA;VENTOLIN HFA) 108 (90 BASE) MCG/ACT INHALER    Inhale 2 puffs into the lungs every 6 (six) hours as needed for wheezing or shortness of breath.   BUDESONIDE-FORMOTEROL (SYMBICORT) 160-4.5 MCG/ACT INHALER    Inhale 2 puffs into the lungs 2 (two) times daily.   CALCIUM CARB-CHOLECALCIFEROL (CALCIUM 600 + D PO)    Take 1 tablet by mouth 2 (two) times daily.    CETIRIZINE (ZYRTEC) 10 MG TABLET    Take 10 mg by mouth daily.   CHLORPHENIRAMINE-HYDROCODONE (TUSSIONEX PENNKINETIC ER) 10-8 MG/5ML SUER    Take 5 mLs by mouth  every 12 (twelve) hours as needed for cough.   DULOXETINE (CYMBALTA) 60 MG CAPSULE    TAKE 1 CAPSULE (60 MG TOTAL) BY MOUTH DAILY. DX (F43.22)   EPINEPHRINE 0.3 MG/0.3 ML IJ SOAJ INJECTION    epinephrine 0.3 mg/0.3 mL injection, auto-injector   FLUTICASONE (FLONASE) 50 MCG/ACT NASAL SPRAY    2 sprays daily.   FUROSEMIDE (LASIX) 40 MG TABLET    TAKE 1 TABLET BY MOUTH EVERY DAY   LEVOCETIRIZINE (XYZAL) 5 MG TABLET    Take 1 tablet (5 mg total) by mouth every evening.   LOSARTAN (COZAAR) 25 MG TABLET    TAKE 3 TABLETS BY MOUTH EVERY DAY   MONTELUKAST (SINGULAIR) 10 MG TABLET    Take 10 mg by mouth at bedtime.   MULTIPLE VITAMINS-MINERALS (MACULAR VITAMIN BENEFIT PO)  Take 1 capsule by mouth 2 (two) times daily.   NEOMYCIN-POLYMYXIN B-DEXAMETHASONE (MAXITROL) 3.5-10000-0.1 OINT    Place 3.5 application into the left eye as needed.   NIFEDIPINE (PROCARDIA XL/NIFEDICAL XL) 60 MG 24 HR TABLET    TAKE 1 TABLET BY MOUTH EVERY DAY   NON FORMULARY    Allergy shots once every other week   OMEGA-3 FATTY ACIDS (FISH OIL PO)    Take by mouth daily.   OXYBUTYNIN (DITROPAN) 5 MG TABLET    oxybutynin chloride 5 mg tablet  TAKE 1 TABLET BY MOUTH TWICE A DAY   PANTOPRAZOLE (PROTONIX) 40 MG TABLET    TAKE 1 TABLET (40 MG TOTAL) BY MOUTH 2 (TWO) TIMES DAILY. FOR STOMACH   POTASSIUM CHLORIDE SA (KLOR-CON M20) 20 MEQ TABLET    TAKE TWO TABLETS BY MOUTH ONCE DAILY FOR POTASSIUM SUPPLEMENT   PRAVASTATIN (PRAVACHOL) 40 MG TABLET    TAKE 1 TABLET BY MOUTH EVERY DAY   PSYLLIUM (METAMUCIL) 58.6 % PACKET    Take 1 packet by mouth daily as needed (constipation).   SPIRIVA HANDIHALER 18 MCG INHALATION CAPSULE    Place 18 mcg into inhaler and inhale daily.    SYSTANE ULTRA 0.4-0.3 % SOLN    SMARTSIG:1 Drop(s) In Eye(s) As Needed   VITAMIN B-12 (CYANOCOBALAMIN) 1000 MCG TABLET    Take 1,000 mcg by mouth daily.  Modified Medications   No medications on file  Discontinued Medications   TIOTROPIUM BROMIDE MONOHYDRATE (SPIRIVA  RESPIMAT) 2.5 MCG/ACT AERS    Inhale 2 puffs into the lungs daily.    Physical Exam:  Vitals:   08/19/20 1008  BP: 126/78  Pulse: 84  Temp: (!) 96.9 F (36.1 C)  TempSrc: Temporal  SpO2: 97%  Weight: 226 lb (102.5 kg)  Height: 5\' 4"  (1.626 m)   Body mass index is 38.79 kg/m. Wt Readings from Last 3 Encounters:  08/19/20 226 lb (102.5 kg)  07/13/20 227 lb 12.8 oz (103.3 kg)  06/04/20 227 lb (103 kg)    Physical Exam Constitutional:      General: She is not in acute distress.    Appearance: She is well-developed and well-nourished. She is not diaphoretic.  HENT:     Head: Normocephalic and atraumatic.     Mouth/Throat:     Mouth: Oropharynx is clear and moist.     Pharynx: No oropharyngeal exudate.  Eyes:     Conjunctiva/sclera: Conjunctivae normal.     Pupils: Pupils are equal, round, and reactive to light.  Cardiovascular:     Rate and Rhythm: Normal rate and regular rhythm.     Heart sounds: Normal heart sounds.  Pulmonary:     Effort: Pulmonary effort is normal.     Breath sounds: Normal breath sounds.  Abdominal:     General: Bowel sounds are normal.     Palpations: Abdomen is soft.  Musculoskeletal:        General: No tenderness.     Cervical back: Normal range of motion and neck supple.     Right lower leg: Edema present.     Left lower leg: Edema present.  Skin:    General: Skin is warm and dry.  Neurological:     Mental Status: She is alert and oriented to person, place, and time.  Psychiatric:        Mood and Affect: Mood and affect and mood normal.        Behavior: Behavior normal.     Labs reviewed:  Basic Metabolic Panel: Recent Labs    08/26/19 0845 02/17/20 1113 08/12/20 0818  NA 143 140 142  K 3.7 4.4 3.9  CL 107 107 109  CO2 28 26 26   GLUCOSE 101* 90 113*  BUN 16 13 14   CREATININE 0.83 0.72 0.86  CALCIUM 9.6 9.5 9.5   Liver Function Tests: Recent Labs    08/26/19 0845 02/17/20 1113 08/12/20 0818  AST 20 19 20   ALT 13 14  15   BILITOT 0.4 0.4 0.5  PROT 7.4 7.4 7.7   No results for input(s): LIPASE, AMYLASE in the last 8760 hours. No results for input(s): AMMONIA in the last 8760 hours. CBC: Recent Labs    08/26/19 0845 02/17/20 1113 08/12/20 0818  WBC 6.1 6.1 6.2  NEUTROABS 3,050 3,209 3,057  HGB 11.4* 11.8 11.8  HCT 35.9 36.3 36.0  MCV 82.7 81.6 82.6  PLT 275 277 272   Lipid Panel: Recent Labs    08/26/19 0845 08/12/20 0818  CHOL 157 162  HDL 57 57  LDLCALC 84 86  TRIG 70 97  CHOLHDL 2.8 2.8   TSH: No results for input(s): TSH in the last 8760 hours. A1C: Lab Results  Component Value Date   HGBA1C 5.6 08/12/2020     Assessment/Plan 1. Chronic cough -ongoing, continues to follow up with pulmonary, allergies, and GI.  - chlorpheniramine-HYDROcodone (TUSSIONEX PENNKINETIC ER) 10-8 MG/5ML SUER; Take 5 mLs by mouth every 12 (twelve) hours as needed for cough.  Dispense: 300 mL; Refill: 0  2. Essential hypertension Controlled on losartan, nifedipine and lasix. Continue dietary modifications.   3. Mild intermittent asthma, unspecified whether complicated Stable on symbicort and spiriva  4. Gastroesophageal reflux disease without esophagitis Controlled on protonix twice daily.  5. Hyperlipidemia LDL goal <130 LDL at goal on pravastatin, continue dietary modifications.   6. Osteopenia, unspecified location Continue weight bearing exercises 5 days a week, 30 mins with cal and vit d  7. Bilateral leg edema Ongoing and stable with lasix and compression hose.   8. Morbid obesity ([place]) BMI 38 with asthma, GERD, hypertension, and hyperlipidemia. Discussed today, encouraged healthy weight loss through diet and exercise.   Next appt: 6 month, labs at appt. Carlos American. [place], [facility] [phone]

## 2020-09-01 ENCOUNTER — Ambulatory Visit: Payer: PPO | Admitting: Gastroenterology

## 2020-09-01 ENCOUNTER — Encounter: Payer: Self-pay | Admitting: Gastroenterology

## 2020-09-01 VITALS — BP 138/80 | HR 66 | Ht 64.0 in | Wt 229.0 lb

## 2020-09-01 DIAGNOSIS — K224 Dyskinesia of esophagus: Secondary | ICD-10-CM

## 2020-09-01 DIAGNOSIS — R053 Chronic cough: Secondary | ICD-10-CM

## 2020-09-01 DIAGNOSIS — K449 Diaphragmatic hernia without obstruction or gangrene: Secondary | ICD-10-CM

## 2020-09-01 DIAGNOSIS — R1314 Dysphagia, pharyngoesophageal phase: Secondary | ICD-10-CM | POA: Diagnosis not present

## 2020-09-01 NOTE — Patient Instructions (Signed)
If you are age 77 or older, your body mass index should be between 23-30. Your Body mass index is 39.31 kg/m. If this is out of the aforementioned range listed, please consider follow up with your Primary Care Provider.  If you are age 64 or younger, your body mass index should be between 19-25. Your Body mass index is 39.31 kg/m. If this is out of the aformentioned range listed, please consider follow up with your Primary Care Provider.   It was a pleasure to see you today!  Dr. Danis  

## 2020-09-01 NOTE — Progress Notes (Signed)
[facility] Progress Note  Chief Complaint: Dysphagia and cough  Subjective  History: Last seen for surveillance colonoscopy a year ago, 3 small adenomatous and serrated polyps removed.  Markedly redundant colon noted making scope advancement challenging.  Given age, current guidelines and findings, no surveillance colonoscopy recommended.  From a clinical history of modified barium study 07/20/2020; "HPI: 75 yr old seen for outpatient MBS. PMH:  HTN, GERD, allergies, asthma. She reports " chronic cough with strangling sometimes when eating" with problem initating "10 years ago". She states she coughs with saliva, coughing is not worse when eating and reports hoarse vocal quality. MBS 01/09/19 with mild pyriform sinus residue, prominent cricopharyngeus, reg/thin recommended. Barium swallow 12/31/18 small hiatal hernia, GERD to level of the thoracic inlet, mild reflux esophagitis, mild esophageal dysmotility and mild cricopharyngeus muscle dysfunction " " Pt's oropharyngeal exectution in regards to motor strength, timing and sensation was within functional limits. One instance of flash penetration which is completely within normal range. She propelled barium pill with several tirals thin to posterior oropharynx. Pill stopped mid esophagus needing addtional time to reach GE junction and stopped. Puree barium facilitated pill through GE juncture. Therapist reviewed results of video and impression and prior diagnosis of esophageal dysmotility and GERD as suspected etiology of symptoms. Reviewed (like last year) strategies to mitigate GER symptoms. Pt voiced understanding and has been following these.     "   Patient was previously seen by Dr. Olevia Perches for these problems while under the care of an ENT practice in [place].  Upper endoscopy with Dr. Olevia Perches May 2016 with a reported Schatzki ring, though on endoscopic appearance looks more like hiatal hernia related stricture.  Bougie dilation  performed. __________________________________   Gaspar Skeeters was referred back by Dr. Lamonte Sakai of pulmonology for chronic cough and consideration of reflux as a contributing factor. She has had many years of intermittent dysphagia, or food or liquid to feel stuck in the throat and may set off coughing. She has occasional feelings of regurgitation and rare heartburn. These have been similar symptoms since at least as far back as when Dr. Olevia Perches was seeing her. She has had extensive work-up by pulmonary and ENT noted below. She sleeps with the head of bed elevated on a wedge, takes twice daily PPI, has been on allergy and asthma treatments. It is difficult for her to tell whether these treatments have improved the cough and throat symptoms. She has cough suppressant and typically takes it if she is going to be in public such as going to church.  ROS: Cardiovascular:  no chest pain Respiratory: no dyspnea. No sputum production with the cough or hemoptysis. Denies dysphonia Has arthralgias Remainder systems negative except as above  The patient's Past Medical, Family and Social History were reviewed and are on file in the EMR. Past Medical History:  Diagnosis Date  . Abnormal Pap smear    Over 35yrs ago  . ALKALINE PHOSPHATASE, ELEVATED 07/01/2009   Qualifier: Diagnosis of  By: [name] MD, [name]   . Allergic rhinitis   . Anemia   . Arthritis   . Asthma   . Cataract    bilateral implants  . Cough    occasional; 9-21: reports as ongoing cough , managing with cough medicine and scheduled and prn inhalers, reports mucus is clear and scant    . Cystocele 11/05/08  . Diverticulosis of colon   . DJD (degenerative joint disease)    no per pt  .  Elevated alkaline phosphatase level   . GERD (gastroesophageal reflux disease)   . H/O urinary incontinence 11/06/2008  . H/O varicella   . Hard measles   . Hx of colonic polyps   . Hypercholesterolemia   . Hypertension   . Leg swelling    occasional  .  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 Overweight(278.02)   . Rectocele 11/06/2008   Large  . Sebaceous cyst    on back  . Sinusitis   . Venous insufficiency   . Wheezing    occasional  . Yeast infection    Most recent pulmonary clinic note by Dr. Lamonte Sakai 07/13/2020 reviewed. Longstanding chronic cough, seen by ENT.  Apparently treated previously with Botox injections, esophageal dilation, and Neurontin in [place]. Patient also has asthma and allergies.  CT scan findings with interstitial markings suggesting possible interstitial lung disease. Objective:  Med list reviewed  Current Outpatient Medications:  .  albuterol (PROVENTIL HFA;VENTOLIN HFA) 108 (90 Base) MCG/ACT inhaler, Inhale 2 puffs into the lungs every 6 (six) hours as needed for wheezing or shortness of breath., Disp: , Rfl:  .  budesonide-formoterol (SYMBICORT) 160-4.5 MCG/ACT inhaler, Inhale 2 puffs into the lungs 2 (two) times daily., Disp: 2 Inhaler, Rfl: 0 .  Calcium Carb-Cholecalciferol (CALCIUM 600 + D PO), Take 1 tablet by mouth 2 (two) times daily. , Disp: , Rfl:  .  cetirizine (ZYRTEC) 10 MG tablet, Take 10 mg by mouth daily., Disp: , Rfl:  .  chlorpheniramine-HYDROcodone (TUSSIONEX PENNKINETIC ER) 10-8 MG/5ML SUER, Take 5 mLs by mouth every 12 (twelve) hours as needed for cough., Disp: 300 mL, Rfl: 0 .  DULoxetine (CYMBALTA) 60 MG capsule, TAKE 1 CAPSULE (60 MG TOTAL) BY MOUTH DAILY. DX (F43.22), Disp: 90 capsule, Rfl: 1 .  EPINEPHrine 0.3 mg/0.3 mL IJ SOAJ injection, epinephrine 0.3 mg/0.3 mL injection, auto-injector, Disp: , Rfl:  .  fluticasone (FLONASE) 50 MCG/ACT nasal spray, 2 sprays daily., Disp: , Rfl: 3 .  furosemide (LASIX) 40 MG tablet, TAKE 1 TABLET BY MOUTH EVERY DAY, Disp: 90 tablet, Rfl: 1 .  levocetirizine (XYZAL) 5 MG tablet, Take 1 tablet (5 mg total) by mouth every evening., Disp: 90 tablet, Rfl: 3 .  losartan (COZAAR) 25 MG tablet, TAKE 3 TABLETS BY MOUTH EVERY DAY, Disp: 270  tablet, Rfl: 1 .  montelukast (SINGULAIR) 10 MG tablet, Take 10 mg by mouth at bedtime., Disp: , Rfl:  .  Multiple Vitamins-Minerals (MACULAR VITAMIN BENEFIT PO), Take 1 capsule by mouth 2 (two) times daily., Disp: , Rfl:  .  neomycin-polymyxin b-dexamethasone (MAXITROL) 3.5-10000-0.1 OINT, Place 3.5 application into the left eye as needed., Disp: , Rfl:  .  NIFEdipine (PROCARDIA XL/NIFEDICAL XL) 60 MG 24 hr tablet, TAKE 1 TABLET BY MOUTH EVERY DAY, Disp: 90 tablet, Rfl: 3 .  NON FORMULARY, Allergy shots once every other week, Disp: , Rfl:  .  Omega-3 Fatty Acids (FISH OIL PO), Take by mouth daily., Disp: , Rfl:  .  oxybutynin (DITROPAN) 5 MG tablet, oxybutynin chloride 5 mg tablet  TAKE 1 TABLET BY MOUTH TWICE A DAY, Disp: , Rfl:  .  pantoprazole (PROTONIX) 40 MG tablet, TAKE 1 TABLET (40 MG TOTAL) BY MOUTH 2 (TWO) TIMES DAILY. FOR STOMACH, Disp: 180 tablet, Rfl: 2 .  potassium chloride SA (KLOR-CON M20) 20 MEQ tablet, TAKE TWO TABLETS BY MOUTH ONCE DAILY FOR POTASSIUM SUPPLEMENT, Disp: 180 tablet, Rfl: 1 .  pravastatin (PRAVACHOL) 40 MG tablet, TAKE 1 TABLET BY MOUTH EVERY  DAY, Disp: 90 tablet, Rfl: 1 .  psyllium (METAMUCIL) 58.6 % packet, Take 1 packet by mouth daily as needed (constipation)., Disp: , Rfl:  .  SPIRIVA HANDIHALER 18 MCG inhalation capsule, Place 18 mcg into inhaler and inhale daily. , Disp: , Rfl: 6 .  SYSTANE ULTRA 0.4-0.3 % SOLN, SMARTSIG:1 Drop(s) In Eye(s) As Needed, Disp: , Rfl:  .  vitamin B-12 (CYANOCOBALAMIN) 1000 MCG tablet, Take 1,000 mcg by mouth daily., Disp: , Rfl:   Current Facility-Administered Medications:  .  0.9 %  sodium chloride infusion, 500 mL, Intravenous, Once, Danis, Estill Cotta III, MD   Vital signs in last 24 hrs: Vitals:   09/01/20 1404  BP: 138/80  Pulse: 66  SpO2: 97%   Wt Readings from Last 3 Encounters:  09/01/20 229 lb (103.9 kg)  08/19/20 226 lb (102.5 kg)  07/13/20 227 lb 12.8 oz (103.3 kg)    Physical Exam  Well-appearing, normal  vocal quality  HEENT: sclera anicteric, oral mucosa moist without lesions  Neck: supple, no thyromegaly, JVD or lymphadenopathy  Cardiac: RRR without murmurs, S1S2 heard, no peripheral edema  Pulm: clear to auscultation bilaterally, normal RR and effort noted  Abdomen: soft, no tenderness, with active bowel sounds. No guarding or palpable hepatosplenomegaly.  Skin; warm and dry, no jaundice or rash  Labs:   ___________________________________________ Radiologic studies:  CLINICAL DATA:  75 year old female with worsening chronic cough. History of esophageal dilation.   EXAM: ESOPHOGRAM / BARIUM SWALLOW / BARIUM TABLET STUDY   TECHNIQUE: Combined double contrast and single contrast examination performed using effervescent crystals, thick barium liquid, and thin barium liquid. The patient was observed with fluoroscopy swallowing a 13 mm barium sulphate tablet.   FLUOROSCOPY TIME:  Fluoroscopy Time:  2 minutes 54 seconds   Radiation Exposure Index (if provided by the fluoroscopic device): 255 mGy   Number of Acquired Spot Images: 7   COMPARISON:  03/26/2017 chest CT angiogram.   FINDINGS: The oral and pharyngeal phases of swallowing are normal, with no laryngeal penetration or tracheobronchial aspiration. There is mild cricopharyngeus muscle dysfunction with mildly delayed opening of the cricopharyngeus muscle.   Mild esophageal dysmotility, characterized by intermittent mild weakening of primary peristalsis in the lower thoracic esophagus. Small fixed hiatal hernia. Marked spontaneous gastroesophageal reflux to the level of the thoracic inlet. There is a small traction diverticulum in the mid thoracic esophagus. Mild granularity of the esophageal mucosa in the lower thoracic esophagus, compatible with mild reflux esophagitis. No evidence of esophageal mass, stricture or ulcer. Barium tablet traversed the esophagus into the stomach without delay.   IMPRESSION: 1.  Small hiatal hernia. Marked spontaneous gastroesophageal reflux to the level of the thoracic inlet. 2. Mild reflux esophagitis in the lower thoracic esophagus. No evidence of esophageal mass, stricture or ulcer. 3. Mild esophageal dysmotility and mild cricopharyngeus muscle dysfunction, characteristic of chronic gastroesophageal reflux disease. 4. Normal oral and pharyngeal phase of swallowing, with no laryngeal penetration or tracheobronchial aspiration.     Electronically Signed   By: Ilona Sorrel M.D.   On: 12/31/2018 10:57  ____________________________________________ Other: CLINICAL DATA:  75 year old female with history of constant cough. Evaluate for interstitial lung disease.   EXAM: CT CHEST WITHOUT CONTRAST   TECHNIQUE: Multidetector CT imaging of the chest was performed following the standard protocol without intravenous contrast. High resolution imaging of the lungs, as well as inspiratory and expiratory imaging, was performed.   COMPARISON:  CTA of the chest 03/26/2017.   FINDINGS: Cardiovascular: Heart  size is normal. There is no significant pericardial fluid, thickening or pericardial calcification. There is aortic atherosclerosis, as well as atherosclerosis of the great vessels of the mediastinum and the coronary arteries, including calcified atherosclerotic plaque in the left main, left anterior descending and right coronary arteries.   Mediastinum/Nodes: No pathologically enlarged mediastinal or hilar lymph nodes. Please note that accurate exclusion of hilar adenopathy is limited on noncontrast CT scans. Esophagus is unremarkable in appearance.   Lungs/Pleura: Multiple small pulmonary nodules scattered throughout the lungs bilaterally measuring 5 mm or less in size. Many of these are stable compared to the prior study from 2018, considered definitively benign. Several other lesions are not confidently identified on the prior examination, but are  considered nonspecific. No acute consolidative airspace disease. No pleural effusions. High-resolution images demonstrate thickening of the peribronchovascular interstitium in the lower lungs where there is some patchy parenchymal banding and architectural distortion, most evident in the lower lobes of the lungs, favored to reflect some mild post infectious or inflammatory scarring. No more generalized regions of ground-glass attenuation, septal thickening, subpleural reticulation, traction bronchiectasis or honeycombing is noted to suggest interstitial lung disease. Inspiratory and expiratory imaging demonstrates some mild air trapping indicative of mild small airways disease.   Upper Abdomen: Aortic atherosclerosis. Incompletely imaged partially calcified lesion in the upper pole of the right kidney. Mural calcifications of the gallbladder.   Musculoskeletal: There are no aggressive appearing lytic or blastic lesions noted in the visualized portions of the skeleton.   IMPRESSION: 1. No compelling imaging findings to clearly indicate interstitial lung disease. 2. Findings in the lung bases suggesting mild post infectious or inflammatory scarring or sequela of mild recurrent aspiration. 3. Multiple small pulmonary nodules measuring 5 mm or less in size. Many of these are stable compared to 2018 and considered benign, however, other nodules cannot be confidently identified on the prior study. No follow-up needed if patient is low-risk (and has no known or suspected primary neoplasm). Non-contrast chest CT can be considered in 12 months if patient is high-risk. This recommendation follows the consensus statement: Guidelines for Management of Incidental Pulmonary Nodules Detected on CT Images: From the Fleischner Society 2017; Radiology 2017; 284:228-243. 4. Aortic atherosclerosis, in addition to left main and 2 vessel coronary artery disease. Assessment for potential risk  factor modification, dietary therapy or pharmacologic therapy may be warranted, if clinically indicated. 5. Incompletely imaged partially calcified lesion in the upper pole of the right kidney, and porcelain gallbladder. Nonemergent abdominal MRI with and without IV gadolinium is suggested in the near future to better evaluate both of these findings.   Aortic Atherosclerosis (ICD10-I70.0).     Electronically Signed   By: Vinnie Langton M.D.   On: 07/30/2020 09:02   _____________________________________________ Assessment & Plan  Assessment: Encounter Diagnoses  Name Primary?  . Pharyngoesophageal dysphagia Yes  . Hiatal hernia   . Chronic cough   . Esophageal dysmotility    Challenging management of chronic multifactorial cough. There are believed to be allergy and asthmatic components, and GERD may be contributing. We certainly know she has GERD based on upper GI studies, but the extent to which it is contributing to her cough is impossible to know. Neither upper endoscopy nor dedicated pH testing, even if they could quantify the patient's reflux, would be able to answer this question with more certainty. It seems the more practical question is if GERD is assumed to be contributing to some degree, what is its optimal management? She is  already on twice daily acid suppression, and we know the limitation those medicines have because they may help heartburn but do not decrease the actual amount of reflux. She has engaged in diet and lifestyle antireflux measures. There is a small hiatal hernia based on imaging and prior endoscopy. Swallow studies indicates she has a significant component of cricopharyngeal dysfunction and esophageal dysmotility. I believe that is the cause of her dysphagia, which cannot be made better by esophageal dilation. Given the demonstrated dysmotility on imaging, I would not advocate surgical therapy such as fundoplication because there is no way to know if it  will truly help her cough and it could worsen her dysphagia.  Therefore, she appears to be on maximal practical therapy for her reflux as we understand it. Attention to the other contributing factors is always warranted and cough suppression is necessary. I would not advocate any additional reflux related testing nor change in medicines at this point.  She will see me as needed   35 minutes were spent on this encounter (including chart review, history/exam, counseling/coordination of care, and documentation) > 50% of that time was spent on counseling and coordination of care.  Topics discussed included: GERD, its contributing factors and treatment. I summarized the results of her testing as charted above.  [name]

## 2020-09-03 DIAGNOSIS — J3081 Allergic rhinitis due to animal (cat) (dog) hair and dander: Secondary | ICD-10-CM | POA: Diagnosis not present

## 2020-09-03 DIAGNOSIS — J3089 Other allergic rhinitis: Secondary | ICD-10-CM | POA: Diagnosis not present

## 2020-09-03 DIAGNOSIS — J301 Allergic rhinitis due to pollen: Secondary | ICD-10-CM | POA: Diagnosis not present

## 2020-09-11 ENCOUNTER — Encounter: Payer: Self-pay | Admitting: Emergency Medicine

## 2020-09-11 ENCOUNTER — Other Ambulatory Visit: Payer: Self-pay

## 2020-09-11 ENCOUNTER — Ambulatory Visit (INDEPENDENT_AMBULATORY_CARE_PROVIDER_SITE_OTHER): Payer: PPO | Admitting: Emergency Medicine

## 2020-09-11 DIAGNOSIS — K219 Gastro-esophageal reflux disease without esophagitis: Secondary | ICD-10-CM | POA: Diagnosis not present

## 2020-09-11 DIAGNOSIS — J4520 Mild intermittent asthma, uncomplicated: Secondary | ICD-10-CM | POA: Diagnosis not present

## 2020-09-11 DIAGNOSIS — J3089 Other allergic rhinitis: Secondary | ICD-10-CM

## 2020-09-11 DIAGNOSIS — J849 Interstitial pulmonary disease, unspecified: Secondary | ICD-10-CM | POA: Diagnosis not present

## 2020-09-11 NOTE — Assessment & Plan Note (Signed)
Continue her current regimen 

## 2020-09-11 NOTE — Assessment & Plan Note (Signed)
No evidence for ILD noted on her high-resolution CT scan of the chest.  She does have some bibasilar scar likely from prior episodes of aspiration.  Discussed this with her today

## 2020-09-11 NOTE — Patient Instructions (Addendum)
Continue to practice safe swallowing techniques.  Continue your Symbicort 2 puffs twice a day.  Rinse and gargle after using. Continue Spiriva 1 inhalation once daily. Keep albuterol available to use 2 puffs when needed for shortness of breath, chest tightness, wheezing. Continue Protonix 40 mg twice a day. Stay off the fish oil Continue Singulair, Xyzal and Flonase as you have been using them. Follow with Dr. Lamonte Sakai in 12 months or sooner if you have any problems.

## 2020-09-11 NOTE — Assessment & Plan Note (Signed)
Appreciate Dr. Loletha Carrow evaluation, swallowing evaluation.  No intervention to be made except for continuing her high-dose PPI.

## 2020-09-11 NOTE — Progress Notes (Signed)
Subjective:    Patient ID: Wendy Figueroa, female    DOB: 10/09/1945, 75 y.o.   MRN: [id number]   HPI 75 year old woman, never smoker, previously followed by Dr. [name] in our office for asthma and obstructive lung disease.  She has chronic allergic rhinitis, GERD with esophageal narrowing requiring dilation in the past, hypertension DJD, pulmonary embolism in 03/2017.  She has longstanding chronic cough.  She has been followed by ENT most recently Dr. Constance Holster.  Prior examinations have shown pharyngeal esophageal dysphagia with dysphonia.  She is tried Botox injections, esophageal dilatation, Neurontin in [place] with some intermittent improvement with treatment. On Symbicort, Spiriva handihaler. Gets immunotherapy with Dr Donneta Romberg. She continues to have a lot of cough, has had some better days since she cut down on caffeine, chocolate, etc. She does feel some breakthrough reflux. Non-productive, often at night. Occasional globus sensation, not always.  Zyrtec, Xyzal, Singulair, fluticasone nasal spray, Protonix twice daily.  Uses Tussionex as needed - about 2x a week.  She is on fish oil and losartan. She does not have much nasal gtt on the regimen above.   Most recent chest x-ray was 10/11/2017, no active disease.  ROV 07/13/20 --75 year old woman who follows up today for chronic longstanding cough.  She has known upper airway instability with prior Botox treatment, has failed multiple interventions.  She has a history of asthma and is on Symbicort and Spiriva.  I changed her to Spiriva Respimat to try to avoid the powder formulation as it upper airway irritant.  She is on an aggressive allergy regimen including immunotherapy, Protonix twice a day.  I stopped her fish oil to see if she would get benefit.  Chest x-ray done on 06/04/2020 showed some increased bilateral pulmonary interstitial markings, unclear etiology, consider edema versus evolving interstitial inflammation. She has noticed an increased since  last time in episodes of "getting strangled" with food. Makes her cough. The cough is worse when she drinks something afterwards - coughing certainly won't stop the cough. The change in Spiriva didn't change much. The cough can happen at other times - feels that something comes up in her throat.   MDM: Reviewed notes from ENT, gastroenterology  ROV 09/11/20 --follow-up visit for 75 year old woman with longstanding chronic cough with known upper airway instability (prior Botox) and multiple failed interventions.  Also with probable asthma, allergic rhinitis, GERD, dysphagia.  At her last visit I stopped her fish oil, continue to reflux medications.  Continued Symbicort and Spiriva.  I also obtained a CT scan of her chest to evaluate possible interstitial changes. She reports that her cough is about the same, does happen every day - has ups and downs.   She underwent a swallowing evaluation 07/20/2020 which I have reviewed, shows some nonspecific dysphagia with pills stopping in mid esophagus, no overt aspiration/penetration.  Regular diet with thin liquids recommended, swallowing precautions were taught.  She saw Dr. Loletha Carrow on 09/01/2020, maximal medical management of her GERD was recommended. She is not feeling any reflux sx.   CT chest 07/30/2020 reviewed by me, shows no clear evidence for interstitial lung disease, some bibasilar scarring consistent with her chronic recurrent aspiration, multiple small pulmonary nodules in a stable pattern compared with 2018.  MDM: Reviewed gastroenterology notes 09/01/2020  Review of Systems As per HPI       Objective:   Physical Exam Vitals:   09/11/20 1341  BP: (!) 144/84  Pulse: 91  Temp: (!) 97 F (36.1 C)  SpO2:  98%  Weight: 227 lb (103 kg)  Height: 5\' 4"  (1.626 m)   Gen: Pleasant, obese woman, in no distress,  normal affect  ENT: No lesions,  mouth clear,  oropharynx clear, no postnasal drip. Cough is quiet during our visit, no throat  clearing  Neck: No JVD, no stridor, strong voice.   Lungs: No use of accessory muscles, no crackles or wheezing on normal respiration, no wheeze on forced expiration  Cardiovascular: RRR, heart sounds normal, no murmur or gallops, trace peripheral edema  Musculoskeletal: No deformities, no cyanosis or clubbing  Neuro: alert, awake, non focal  Skin: Warm, no lesions or rash      Assessment & Plan:  ILD (interstitial lung disease) ([place]) No evidence for ILD noted on her high-resolution CT scan of the chest.  She does have some bibasilar scar likely from prior episodes of aspiration.  Discussed this with her today  Asthma, mild intermittent She would like to stay on the Spiriva in addition to the Symbicort.  She feels that she benefits from both.  We will plan to continue although I suspect that most of her symptoms are driven by upper airway and that she could get away with coming off the Spiriva  Allergic rhinitis Continue her current regimen  LPRD (laryngopharyngeal reflux disease) Appreciate Dr. Loletha Carrow evaluation, swallowing evaluation.  No intervention to be made except for continuing her high-dose PPI.  Baltazar Apo, MD, PhD 09/11/2020, 1:59 PM [facility] [phone] or if no answer [phone]

## 2020-09-11 NOTE — Assessment & Plan Note (Signed)
She would like to stay on the Spiriva in addition to the Symbicort.  She feels that she benefits from both.  We will plan to continue although I suspect that most of her symptoms are driven by upper airway and that she could get away with coming off the Spiriva

## 2020-09-16 DIAGNOSIS — J3089 Other allergic rhinitis: Secondary | ICD-10-CM | POA: Diagnosis not present

## 2020-09-16 DIAGNOSIS — J3081 Allergic rhinitis due to animal (cat) (dog) hair and dander: Secondary | ICD-10-CM | POA: Diagnosis not present

## 2020-09-16 DIAGNOSIS — J301 Allergic rhinitis due to pollen: Secondary | ICD-10-CM | POA: Diagnosis not present

## 2020-09-24 ENCOUNTER — Other Ambulatory Visit: Payer: Self-pay | Admitting: Nurse Practitioner

## 2020-09-24 DIAGNOSIS — I10 Essential (primary) hypertension: Secondary | ICD-10-CM

## 2020-09-25 DIAGNOSIS — H353132 Nonexudative age-related macular degeneration, bilateral, intermediate dry stage: Secondary | ICD-10-CM | POA: Diagnosis not present

## 2020-09-25 DIAGNOSIS — H40023 Open angle with borderline findings, high risk, bilateral: Secondary | ICD-10-CM | POA: Diagnosis not present

## 2020-09-25 DIAGNOSIS — H11423 Conjunctival edema, bilateral: Secondary | ICD-10-CM | POA: Diagnosis not present

## 2020-09-25 DIAGNOSIS — H04123 Dry eye syndrome of bilateral lacrimal glands: Secondary | ICD-10-CM | POA: Diagnosis not present

## 2020-10-02 DIAGNOSIS — J3081 Allergic rhinitis due to animal (cat) (dog) hair and dander: Secondary | ICD-10-CM | POA: Diagnosis not present

## 2020-10-02 DIAGNOSIS — J301 Allergic rhinitis due to pollen: Secondary | ICD-10-CM | POA: Diagnosis not present

## 2020-10-02 DIAGNOSIS — J3089 Other allergic rhinitis: Secondary | ICD-10-CM | POA: Diagnosis not present

## 2020-10-16 DIAGNOSIS — J301 Allergic rhinitis due to pollen: Secondary | ICD-10-CM | POA: Diagnosis not present

## 2020-10-16 DIAGNOSIS — J3089 Other allergic rhinitis: Secondary | ICD-10-CM | POA: Diagnosis not present

## 2020-10-16 DIAGNOSIS — J3081 Allergic rhinitis due to animal (cat) (dog) hair and dander: Secondary | ICD-10-CM | POA: Diagnosis not present

## 2020-10-30 DIAGNOSIS — J3081 Allergic rhinitis due to animal (cat) (dog) hair and dander: Secondary | ICD-10-CM | POA: Diagnosis not present

## 2020-10-30 DIAGNOSIS — J301 Allergic rhinitis due to pollen: Secondary | ICD-10-CM | POA: Diagnosis not present

## 2020-10-30 DIAGNOSIS — J3089 Other allergic rhinitis: Secondary | ICD-10-CM | POA: Diagnosis not present

## 2020-11-13 DIAGNOSIS — J301 Allergic rhinitis due to pollen: Secondary | ICD-10-CM | POA: Diagnosis not present

## 2020-11-13 DIAGNOSIS — J3089 Other allergic rhinitis: Secondary | ICD-10-CM | POA: Diagnosis not present

## 2020-11-13 DIAGNOSIS — J3081 Allergic rhinitis due to animal (cat) (dog) hair and dander: Secondary | ICD-10-CM | POA: Diagnosis not present

## 2020-11-17 ENCOUNTER — Encounter: Payer: Self-pay | Admitting: Podiatry

## 2020-11-17 ENCOUNTER — Ambulatory Visit: Payer: PPO | Admitting: Podiatry

## 2020-11-17 ENCOUNTER — Other Ambulatory Visit: Payer: Self-pay

## 2020-11-17 DIAGNOSIS — B351 Tinea unguium: Secondary | ICD-10-CM | POA: Diagnosis not present

## 2020-11-17 DIAGNOSIS — I872 Venous insufficiency (chronic) (peripheral): Secondary | ICD-10-CM | POA: Diagnosis not present

## 2020-11-17 DIAGNOSIS — M79674 Pain in right toe(s): Secondary | ICD-10-CM

## 2020-11-17 DIAGNOSIS — M79675 Pain in left toe(s): Secondary | ICD-10-CM | POA: Diagnosis not present

## 2020-11-17 NOTE — Progress Notes (Signed)
This patient returns to my office for at risk foot care.  This patient requires this care by a professional since this patient will be at risk due to having venous insufficiency  This patient is unable to cut nails herself since the patient cannot reach her nails.These nails are painful walking and wearing shoes.  This patient presents for at risk foot care today.  General Appearance  Alert, conversant and in no acute stress.  Vascular  Dorsalis pedis and posterior tibial  pulses are palpable  bilaterally.  Capillary return is within normal limits  bilaterally. Temperature is within normal limits  bilaterally.  Neurologic  Senn-Weinstein monofilament wire test within normal limits  bilaterally. Muscle power within normal limits bilaterally.  Nails Thick disfigured discolored nails with subungual debris  from hallux to fifth toes bilaterally. No evidence of bacterial infection or drainage bilaterally.  Orthopedic  No limitations of motion  feet .  No crepitus or effusions noted.  HAV with hammer toes  B/L.  Skin  normotropic skin with no porokeratosis noted bilaterally.  No signs of infections or ulcers noted.     Onychomycosis  Pain in right toes  Pain in left toes  Consent was obtained for treatment procedures.   Mechanical debridement of nails 1-5  bilaterally performed with a nail nipper.  Filed with dremel without incident. No infection or ulcer.     Return office visit    12 weeks                Told patient to return for periodic foot care and evaluation due to potential at risk complications.   Elmo Shumard DPM  

## 2020-11-20 ENCOUNTER — Other Ambulatory Visit: Payer: Self-pay | Admitting: Nurse Practitioner

## 2020-11-20 DIAGNOSIS — F4322 Adjustment disorder with anxiety: Secondary | ICD-10-CM

## 2020-11-27 ENCOUNTER — Other Ambulatory Visit: Payer: Self-pay | Admitting: *Deleted

## 2020-11-27 DIAGNOSIS — J301 Allergic rhinitis due to pollen: Secondary | ICD-10-CM | POA: Diagnosis not present

## 2020-11-27 DIAGNOSIS — J3081 Allergic rhinitis due to animal (cat) (dog) hair and dander: Secondary | ICD-10-CM | POA: Diagnosis not present

## 2020-11-27 DIAGNOSIS — J3089 Other allergic rhinitis: Secondary | ICD-10-CM | POA: Diagnosis not present

## 2020-11-27 MED ORDER — PRAVASTATIN SODIUM 40 MG PO TABS
40.0000 mg | ORAL_TABLET | Freq: Every day | ORAL | 1 refills | Status: DC
Start: 2020-11-27 — End: 2021-09-14

## 2020-11-27 NOTE — Telephone Encounter (Signed)
Patient requested refill

## 2020-12-11 DIAGNOSIS — J3089 Other allergic rhinitis: Secondary | ICD-10-CM | POA: Diagnosis not present

## 2020-12-11 DIAGNOSIS — J301 Allergic rhinitis due to pollen: Secondary | ICD-10-CM | POA: Diagnosis not present

## 2020-12-11 DIAGNOSIS — J3081 Allergic rhinitis due to animal (cat) (dog) hair and dander: Secondary | ICD-10-CM | POA: Diagnosis not present

## 2020-12-25 DIAGNOSIS — J301 Allergic rhinitis due to pollen: Secondary | ICD-10-CM | POA: Diagnosis not present

## 2020-12-25 DIAGNOSIS — J3089 Other allergic rhinitis: Secondary | ICD-10-CM | POA: Diagnosis not present

## 2020-12-25 DIAGNOSIS — J3081 Allergic rhinitis due to animal (cat) (dog) hair and dander: Secondary | ICD-10-CM | POA: Diagnosis not present

## 2021-01-08 DIAGNOSIS — J3089 Other allergic rhinitis: Secondary | ICD-10-CM | POA: Diagnosis not present

## 2021-01-08 DIAGNOSIS — J301 Allergic rhinitis due to pollen: Secondary | ICD-10-CM | POA: Diagnosis not present

## 2021-01-08 DIAGNOSIS — J3081 Allergic rhinitis due to animal (cat) (dog) hair and dander: Secondary | ICD-10-CM | POA: Diagnosis not present

## 2021-01-22 DIAGNOSIS — J3089 Other allergic rhinitis: Secondary | ICD-10-CM | POA: Diagnosis not present

## 2021-01-22 DIAGNOSIS — J3081 Allergic rhinitis due to animal (cat) (dog) hair and dander: Secondary | ICD-10-CM | POA: Diagnosis not present

## 2021-01-22 DIAGNOSIS — J301 Allergic rhinitis due to pollen: Secondary | ICD-10-CM | POA: Diagnosis not present

## 2021-02-02 ENCOUNTER — Other Ambulatory Visit: Payer: Self-pay | Admitting: Nurse Practitioner

## 2021-02-05 DIAGNOSIS — J3081 Allergic rhinitis due to animal (cat) (dog) hair and dander: Secondary | ICD-10-CM | POA: Diagnosis not present

## 2021-02-05 DIAGNOSIS — J301 Allergic rhinitis due to pollen: Secondary | ICD-10-CM | POA: Diagnosis not present

## 2021-02-05 DIAGNOSIS — J3089 Other allergic rhinitis: Secondary | ICD-10-CM | POA: Diagnosis not present

## 2021-02-19 ENCOUNTER — Ambulatory Visit (INDEPENDENT_AMBULATORY_CARE_PROVIDER_SITE_OTHER): Payer: PPO | Admitting: Nurse Practitioner

## 2021-02-19 ENCOUNTER — Other Ambulatory Visit: Payer: Self-pay

## 2021-02-19 ENCOUNTER — Encounter: Payer: Self-pay | Admitting: Nurse Practitioner

## 2021-02-19 ENCOUNTER — Ambulatory Visit: Payer: PPO | Admitting: Podiatry

## 2021-02-19 VITALS — BP 128/80 | HR 95 | Temp 97.9°F | Ht 64.0 in | Wt 215.0 lb

## 2021-02-19 DIAGNOSIS — F4322 Adjustment disorder with anxiety: Secondary | ICD-10-CM | POA: Diagnosis not present

## 2021-02-19 DIAGNOSIS — I10 Essential (primary) hypertension: Secondary | ICD-10-CM | POA: Diagnosis not present

## 2021-02-19 DIAGNOSIS — J3089 Other allergic rhinitis: Secondary | ICD-10-CM | POA: Diagnosis not present

## 2021-02-19 DIAGNOSIS — K219 Gastro-esophageal reflux disease without esophagitis: Secondary | ICD-10-CM | POA: Diagnosis not present

## 2021-02-19 DIAGNOSIS — M8580 Other specified disorders of bone density and structure, unspecified site: Secondary | ICD-10-CM

## 2021-02-19 DIAGNOSIS — R053 Chronic cough: Secondary | ICD-10-CM

## 2021-02-19 DIAGNOSIS — J3081 Allergic rhinitis due to animal (cat) (dog) hair and dander: Secondary | ICD-10-CM | POA: Diagnosis not present

## 2021-02-19 DIAGNOSIS — J301 Allergic rhinitis due to pollen: Secondary | ICD-10-CM | POA: Diagnosis not present

## 2021-02-19 DIAGNOSIS — J4520 Mild intermittent asthma, uncomplicated: Secondary | ICD-10-CM | POA: Diagnosis not present

## 2021-02-19 DIAGNOSIS — E785 Hyperlipidemia, unspecified: Secondary | ICD-10-CM | POA: Diagnosis not present

## 2021-02-19 MED ORDER — HYDROCOD POLST-CPM POLST ER 10-8 MG/5ML PO SUER: 5.0000 mL | Freq: Two times a day (BID) | ORAL | 0 refills | Status: DC | PRN

## 2021-02-19 NOTE — Patient Instructions (Signed)
Go ahead and get COVID booster  To get shingrix vaccine at the local pharmacy

## 2021-02-19 NOTE — Progress Notes (Signed)
Careteam: Patient Care Team: Sharon Seller, NP as PCP - General (Geriatric Medicine) Mateo Flow, MD as Consulting Physician (Ophthalmology)  PLACE OF SERVICE:  [facility]  Advanced Directive information Does Patient Have a Medical Advance Directive?: Yes, Type of Advance Directive: Out of facility DNR (pink MOST or yellow form), Pre-existing out of facility DNR order (yellow form or pink MOST form): Pink MOST form placed in chart (order not valid for inpatient use), Does patient want to make changes to medical advance directive?: No - Patient declined  Allergies  Allergen Reactions   Atenolol Other (See Comments)   Lisinopril Other (See Comments)   Meperidine Hcl Other (See Comments)   Penicillin G Sodium Other (See Comments)   Demerol Hives    All over the body    Lisinopril-Hydrochlorothiazide Hives    All over the body   Penicillins Hives    Has patient had a PCN reaction causing immediate rash, facial/tongue/throat swelling, SOB or lightheadedness with hypotension: No Has patient had a PCN reaction causing severe rash involving mucus membranes or skin necrosis: No Has patient had a PCN reaction that required hospitalization: No Has patient had a PCN reaction occurring within the last 10 years: No If all of the above answers are "NO", then may proceed with Cephalosporin use.   All over the body    Chief Complaint  Patient presents with   Medical Management of Chronic Issues    6 month follow-up. Discuss need for shingrix and covid # 4 vaccine or exclude.      HPI: Patient is a 75 y.o. female for routine follow up.   Has lost 13 lbs. She has stopped caffeine, sodas, and chocolate due to her cough and increase in walking.   Mood is good, good days and bads days. Stays hopeful.   Continues on xyzal, singulair, and flonase for allergies  Blood pressure controlled on nifedipine 60 mg daily, no chest pains or shortness of breath.   Continues on pravachol  for hyperlipidemia  Review of Systems:  Review of Systems  Constitutional:  Negative for chills, fever and weight loss.  HENT:  Negative for tinnitus.   Respiratory:  Positive for cough. Negative for sputum production and shortness of breath.   Cardiovascular:  Negative for chest pain, palpitations and leg swelling.  Gastrointestinal:  Negative for abdominal pain, constipation, diarrhea and heartburn.  Genitourinary:  Negative for dysuria, frequency and urgency.  Musculoskeletal:  Negative for back pain, falls, joint pain and myalgias.  Skin: Negative.   Neurological:  Negative for dizziness and headaches.  Psychiatric/Behavioral:  Negative for depression and memory loss. The patient does not have insomnia.    Past Medical History:  Diagnosis Date   Abnormal Pap smear    Over 66yrs ago   ALKALINE PHOSPHATASE, ELEVATED 07/01/2009   Qualifier: Diagnosis of  By: Kriste Basque MD, Lonzo Cloud    Allergic rhinitis    Anemia    Arthritis    Asthma    Cataract    bilateral implants   Cough    occasional; 9-21: reports as ongoing cough , managing with cough medicine and scheduled and prn inhalers, reports mucus is clear and scant     Cystocele 11/05/08   Diverticulosis of colon    DJD (degenerative joint disease)    no per pt   Elevated alkaline phosphatase level    GERD (gastroesophageal reflux disease)    H/O urinary incontinence 11/06/2008   H/O varicella    Hard  measles    Hx of colonic polyps    Hypercholesterolemia    Hypertension    Leg swelling    occasional   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 Octavio Manns   BLADDER SUSPENSION N/A 05/15/2019   Procedure: TRANSVAGINAL TAPE (TVT) PROCEDURE;  Surgeon: Everett Graff, MD;  Location: [facility];  Service: Gynecology;  Laterality: N/A;   bladder tack  2011   BREAST SURGERY  1978   cyst removed from both removed   cataracts   approx 10 years   Dr Johnnye Sima , ctaract with lens placement    COLONOSCOPY     CYSTOSCOPY N/A 05/15/2019   Procedure: CYSTOSCOPY;  Surgeon: Everett Graff, MD;  Location: [facility];  Service: Gynecology;  Laterality: N/A;   ESOPHAGOGASTRODUODENOSCOPY     Social History:   reports that she has never smoked. She has never used smokeless tobacco. She reports current alcohol use of about 2.0 standard drinks of alcohol per week. She reports that she does not use drugs.  Family History  Problem Relation Age of Onset   Aneurysm Mother    Lung cancer Father    Hypertension Daughter    Hypertension Daughter    Colon cancer Neg Hx    Esophageal cancer Neg Hx    Stomach cancer Neg Hx    Rectal cancer Neg Hx    Colon polyps Neg Hx     Medications: Patient's Medications  New Prescriptions   No medications on file  Previous Medications   ALBUTEROL (PROVENTIL HFA;VENTOLIN HFA) 108 (90 BASE) MCG/ACT INHALER    Inhale 2 puffs into the lungs every 6 (six) hours as needed for wheezing or shortness of breath.   BUDESONIDE-FORMOTEROL (SYMBICORT) 160-4.5 MCG/ACT INHALER    Inhale 2 puffs into the lungs 2 (two) times daily.   CALCIUM CARB-CHOLECALCIFEROL (CALCIUM 600 + D PO)    Take 1 tablet by mouth 2 (two) times daily.    CETIRIZINE (ZYRTEC) 10 MG TABLET    Take 10 mg by mouth daily.   CHLORPHENIRAMINE-HYDROCODONE (TUSSIONEX PENNKINETIC ER) 10-8 MG/5ML SUER    Take 5 mLs by mouth every 12 (twelve) hours as needed  for cough.   DULOXETINE (CYMBALTA) 60 MG CAPSULE    TAKE 1 CAPSULE (60 MG TOTAL) BY MOUTH DAILY. DX (F43.22)   EPINEPHRINE 0.3 MG/0.3 ML IJ SOAJ INJECTION    epinephrine 0.3 mg/0.3 mL injection, auto-injector   FLUTICASONE (FLONASE) 50 MCG/ACT NASAL SPRAY    2 sprays daily.   FUROSEMIDE (LASIX) 40 MG TABLET    TAKE 1 TABLET BY MOUTH EVERY DAY   LEVOCETIRIZINE (XYZAL) 5 MG TABLET    Take 1 tablet (5 mg total) by mouth every evening.   LOSARTAN (COZAAR) 25 MG TABLET    TAKE 3 TABLETS BY MOUTH EVERY DAY   MONTELUKAST (SINGULAIR) 10 MG TABLET  Take 10 mg by mouth at bedtime.   MULTIPLE VITAMINS-MINERALS (MACULAR VITAMIN BENEFIT PO)    Take 1 capsule by mouth 2 (two) times daily.   NEOMYCIN-POLYMYXIN B-DEXAMETHASONE (MAXITROL) 3.5-10000-0.1 OINT    Place 3.5 application into the left eye as needed.   NIFEDIPINE (PROCARDIA XL/NIFEDICAL XL) 60 MG 24 HR TABLET    TAKE 1 TABLET BY MOUTH EVERY DAY   NON FORMULARY    Allergy shots once every other week   OXYBUTYNIN (DITROPAN) 5 MG TABLET    oxybutynin chloride 5 mg tablet  TAKE 1 TABLET BY MOUTH TWICE A DAY   PANTOPRAZOLE (PROTONIX) 40 MG TABLET    TAKE 1 TABLET (40 MG TOTAL) BY MOUTH 2 (TWO) TIMES DAILY. FOR STOMACH   POTASSIUM CHLORIDE SA (KLOR-CON M20) 20 MEQ TABLET    TAKE TWO TABLETS BY MOUTH ONCE DAILY FOR POTASSIUM SUPPLEMENT   PRAVASTATIN (PRAVACHOL) 40 MG TABLET    Take 1 tablet (40 mg total) by mouth daily.   PSYLLIUM (METAMUCIL) 58.6 % PACKET    Take 1 packet by mouth daily as needed (constipation).   SPIRIVA HANDIHALER 18 MCG INHALATION CAPSULE    Place 18 mcg into inhaler and inhale daily.    SYSTANE ULTRA 0.4-0.3 % SOLN    SMARTSIG:1 Drop(s) In Eye(s) As Needed   VITAMIN B-12 (CYANOCOBALAMIN) 1000 MCG TABLET    Take 1,000 mcg by mouth daily.  Modified Medications   No medications on file  Discontinued Medications   OMEGA-3 FATTY ACIDS (FISH OIL PO)    Take by mouth daily.    Physical Exam:  Vitals:   02/19/21 1028  BP: 128/80   Pulse: 95  Temp: 97.9 F (36.6 C)  TempSrc: Temporal  SpO2: 96%  Weight: 215 lb (97.5 kg)  Height: $Remove'5\' 4"'WsZGaqE$  (1.626 m)   Body mass index is 36.9 kg/m. Wt Readings from Last 3 Encounters:  02/19/21 215 lb (97.5 kg)  09/11/20 227 lb (103 kg)  09/01/20 229 lb (103.9 kg)    Physical Exam Constitutional:      General: She is not in acute distress.    Appearance: She is well-developed. She is not diaphoretic.  HENT:     Head: Normocephalic and atraumatic.     Mouth/Throat:     Pharynx: No oropharyngeal exudate.  Eyes:     Conjunctiva/sclera: Conjunctivae normal.     Pupils: Pupils are equal, round, and reactive to light.  Cardiovascular:     Rate and Rhythm: Normal rate and regular rhythm.     Heart sounds: Normal heart sounds.  Pulmonary:     Effort: Pulmonary effort is normal.     Breath sounds: Normal breath sounds.  Abdominal:     General: Bowel sounds are normal.     Palpations: Abdomen is soft.  Musculoskeletal:     Cervical back: Normal range of motion and neck supple.     Right lower leg: No edema.     Left lower leg: No edema.  Skin:    General: Skin is warm and dry.  Neurological:     Mental Status: She is alert.  Psychiatric:        Mood and Affect: Mood normal.    Labs reviewed: Basic Metabolic Panel: Recent Labs    08/12/20 0818  NA 142  K 3.9  CL 109  CO2 26  GLUCOSE 113*  BUN 14  CREATININE 0.86  CALCIUM 9.5   Liver Function Tests: Recent Labs    08/12/20 0818  AST 20  ALT 15  BILITOT 0.5  PROT 7.7   No results for input(s): LIPASE, AMYLASE in the last 8760 hours. No results for input(s): AMMONIA in the last 8760 hours. CBC: Recent Labs    08/12/20 0818  WBC 6.2  NEUTROABS 3,057  HGB 11.8  HCT 36.0  MCV 82.6  PLT 272   Lipid Panel: Recent Labs    08/12/20 0818  CHOL 162  HDL 57  LDLCALC 86  TRIG 97  CHOLHDL 2.8   TSH: No results for input(s): TSH in the last 8760 hours. A1C: Lab Results  Component Value Date    HGBA1C 5.6 08/12/2020     Assessment/Plan 1. Essential hypertension -controlled on losartan and lasix 40 mg daily - CMP with eGFR([facility]) - CBC with Differential/Platelet  2. Mild intermittent asthma, unspecified whether complicated -controlled at this time, continues on Symbicort, singulair and albuterol PRN - CBC with Differential/Platelet  3. Hyperlipidemia LDL goal <130 LDL at goal in December, continues on pravastatin.  - CMP with eGFR([facility])  4. Morbid obesity ([place]) -education provided on healthy weight loss through increase in physical activity and proper nutrition   5. Adjustment disorder with anxious mood Controlled on cymbalta.  - CMP with eGFR([facility])  6. Osteopenia, unspecified location -continue cal and vit d with weight bearing exercises recommended.  - CMP with eGFR([facility])  7. Gastroesophageal reflux disease without esophagitis Stable on protonix daily   8. Chronic cough Stable at this time, using tussionex rarely if needed - chlorpheniramine-HYDROcodone (TUSSIONEX PENNKINETIC ER) 10-8 MG/5ML SUER; Take 5 mLs by mouth every 12 (twelve) hours as needed for cough.  Dispense: 300 mL; Refill: 0    Next appt: 6 months.  Carlos American. [place], [facility] [phone]

## 2021-02-20 LAB — COMPLETE METABOLIC PANEL WITH GFR
AG Ratio: 1.1 (calc) (ref 1.0–2.5)
ALT: 13 U/L (ref 6–29)
AST: 21 U/L (ref 10–35)
Albumin: 3.8 g/dL (ref 3.6–5.1)
Alkaline phosphatase (APISO): 136 U/L (ref 37–153)
BUN: 15 mg/dL (ref 7–25)
CO2: 20 mmol/L (ref 20–32)
Calcium: 9.6 mg/dL (ref 8.6–10.4)
Chloride: 109 mmol/L (ref 98–110)
Creat: 0.93 mg/dL (ref 0.60–0.93)
GFR, Est African American: 69 mL/min/{1.73_m2} (ref >=60)
GFR, Est Non African American: 59 mL/min/{1.73_m2} — ABNORMAL LOW (ref >=60)
Globulin: 3.4 g/dL (calc) (ref 1.9–3.7)
Glucose, Bld: 83 mg/dL (ref 65–99)
Potassium: 4.7 mmol/L (ref 3.5–5.3)
Sodium: 139 mmol/L (ref 135–146)
Total Bilirubin: 0.5 mg/dL (ref 0.2–1.2)
Total Protein: 7.2 g/dL (ref 6.1–8.1)

## 2021-02-20 LAB — CBC WITH DIFFERENTIAL/PLATELET
Absolute Monocytes: 502 cells/uL (ref 200–950)
Basophils Absolute: 51 cells/uL (ref 0–200)
Basophils Relative: 0.9 %
Eosinophils Absolute: 143 cells/uL (ref 15–500)
Eosinophils Relative: 2.5 %
HCT: 36.3 % (ref 35.0–45.0)
Hemoglobin: 11.5 g/dL — ABNORMAL LOW (ref 11.7–15.5)
Lymphs Abs: 2605 cells/uL (ref 850–3900)
MCH: 26.3 pg — ABNORMAL LOW (ref 27.0–33.0)
MCHC: 31.7 g/dL — ABNORMAL LOW (ref 32.0–36.0)
MCV: 82.9 fL (ref 80.0–100.0)
MPV: 11.4 fL (ref 7.5–12.5)
Monocytes Relative: 8.8 %
Neutro Abs: 2400 cells/uL (ref 1500–7800)
Neutrophils Relative %: 42.1 %
Platelets: 266 10*3/uL (ref 140–400)
RBC: 4.38 10*6/uL (ref 3.80–5.10)
RDW: 14.3 % (ref 11.0–15.0)
Total Lymphocyte: 45.7 %
WBC: 5.7 10*3/uL (ref 3.8–10.8)

## 2021-02-23 ENCOUNTER — Ambulatory Visit: Payer: PPO | Admitting: Podiatry

## 2021-02-23 ENCOUNTER — Other Ambulatory Visit: Payer: Self-pay

## 2021-02-23 ENCOUNTER — Encounter: Payer: Self-pay | Admitting: Podiatry

## 2021-02-23 DIAGNOSIS — M79674 Pain in right toe(s): Secondary | ICD-10-CM

## 2021-02-23 DIAGNOSIS — B351 Tinea unguium: Secondary | ICD-10-CM | POA: Diagnosis not present

## 2021-02-23 DIAGNOSIS — I872 Venous insufficiency (chronic) (peripheral): Secondary | ICD-10-CM

## 2021-02-23 DIAGNOSIS — M79675 Pain in left toe(s): Secondary | ICD-10-CM

## 2021-02-23 NOTE — Progress Notes (Signed)
This patient returns to my office for at risk foot care.  This patient requires this care by a professional since this patient will be at risk due to having venous insufficiency  This patient is unable to cut nails herself since the patient cannot reach her nails.These nails are painful walking and wearing shoes.  This patient presents for at risk foot care today.  General Appearance  Alert, conversant and in no acute stress.  Vascular  Dorsalis pedis and posterior tibial  pulses are palpable  bilaterally.  Capillary return is within normal limits  bilaterally. Temperature is within normal limits  bilaterally.  Neurologic  Senn-Weinstein monofilament wire test within normal limits  bilaterally. Muscle power within normal limits bilaterally.  Nails Thick disfigured discolored nails with subungual debris  from hallux to fifth toes bilaterally. No evidence of bacterial infection or drainage bilaterally.  Orthopedic  No limitations of motion  feet .  No crepitus or effusions noted.  HAV with hammer toes  B/L.  Skin  normotropic skin with no porokeratosis noted bilaterally.  No signs of infections or ulcers noted.     Onychomycosis  Pain in right toes  Pain in left toes  Consent was obtained for treatment procedures.   Mechanical debridement of nails 1-5  bilaterally performed with a nail nipper.  Filed with dremel without incident. No infection or ulcer.     Return office visit    12 weeks                Told patient to return for periodic foot care and evaluation due to potential at risk complications.   Gilmar Bua DPM  

## 2021-03-08 DIAGNOSIS — J3081 Allergic rhinitis due to animal (cat) (dog) hair and dander: Secondary | ICD-10-CM | POA: Diagnosis not present

## 2021-03-08 DIAGNOSIS — J301 Allergic rhinitis due to pollen: Secondary | ICD-10-CM | POA: Diagnosis not present

## 2021-03-08 DIAGNOSIS — J3089 Other allergic rhinitis: Secondary | ICD-10-CM | POA: Diagnosis not present

## 2021-03-11 DIAGNOSIS — D229 Melanocytic nevi, unspecified: Secondary | ICD-10-CM | POA: Diagnosis not present

## 2021-03-11 DIAGNOSIS — R159 Full incontinence of feces: Secondary | ICD-10-CM | POA: Diagnosis not present

## 2021-03-11 DIAGNOSIS — Z01419 Encounter for gynecological examination (general) (routine) without abnormal findings: Secondary | ICD-10-CM | POA: Diagnosis not present

## 2021-03-12 DIAGNOSIS — J301 Allergic rhinitis due to pollen: Secondary | ICD-10-CM | POA: Diagnosis not present

## 2021-03-12 DIAGNOSIS — J3089 Other allergic rhinitis: Secondary | ICD-10-CM | POA: Diagnosis not present

## 2021-03-12 DIAGNOSIS — J3081 Allergic rhinitis due to animal (cat) (dog) hair and dander: Secondary | ICD-10-CM | POA: Diagnosis not present

## 2021-03-25 ENCOUNTER — Other Ambulatory Visit: Payer: Self-pay | Admitting: *Deleted

## 2021-03-25 DIAGNOSIS — J3081 Allergic rhinitis due to animal (cat) (dog) hair and dander: Secondary | ICD-10-CM | POA: Diagnosis not present

## 2021-03-25 DIAGNOSIS — J301 Allergic rhinitis due to pollen: Secondary | ICD-10-CM | POA: Diagnosis not present

## 2021-03-25 DIAGNOSIS — J3089 Other allergic rhinitis: Secondary | ICD-10-CM | POA: Diagnosis not present

## 2021-03-25 MED ORDER — OXYBUTYNIN CHLORIDE 5 MG PO TABS: ORAL_TABLET | ORAL | 1 refills | Status: DC

## 2021-03-25 NOTE — Telephone Encounter (Signed)
Patient requested refill.  Pended Rx and sent to Jessica for approval.  

## 2021-04-08 DIAGNOSIS — J301 Allergic rhinitis due to pollen: Secondary | ICD-10-CM | POA: Diagnosis not present

## 2021-04-08 DIAGNOSIS — J3081 Allergic rhinitis due to animal (cat) (dog) hair and dander: Secondary | ICD-10-CM | POA: Diagnosis not present

## 2021-04-08 DIAGNOSIS — J3089 Other allergic rhinitis: Secondary | ICD-10-CM | POA: Diagnosis not present

## 2021-04-15 DIAGNOSIS — J3081 Allergic rhinitis due to animal (cat) (dog) hair and dander: Secondary | ICD-10-CM | POA: Diagnosis not present

## 2021-04-15 DIAGNOSIS — J301 Allergic rhinitis due to pollen: Secondary | ICD-10-CM | POA: Diagnosis not present

## 2021-04-15 DIAGNOSIS — J3089 Other allergic rhinitis: Secondary | ICD-10-CM | POA: Diagnosis not present

## 2021-04-16 ENCOUNTER — Telehealth: Payer: Self-pay | Admitting: *Deleted

## 2021-04-16 DIAGNOSIS — M8580 Other specified disorders of bone density and structure, unspecified site: Secondary | ICD-10-CM

## 2021-04-16 NOTE — Telephone Encounter (Signed)
Patient called and stated that she has an appointment scheduled for a Mammogram at [facility] on 04/30/2021. Stated that she is also due for a Bone Density and wants an order placed.   Requesting order for Bone Density to be placed for [facility].   Please Advise.

## 2021-04-16 NOTE — Telephone Encounter (Signed)
Will order bone density to be done at [facility] mammogram 04/30/2021

## 2021-04-23 DIAGNOSIS — J3089 Other allergic rhinitis: Secondary | ICD-10-CM | POA: Diagnosis not present

## 2021-04-23 DIAGNOSIS — J3081 Allergic rhinitis due to animal (cat) (dog) hair and dander: Secondary | ICD-10-CM | POA: Diagnosis not present

## 2021-04-23 DIAGNOSIS — J301 Allergic rhinitis due to pollen: Secondary | ICD-10-CM | POA: Diagnosis not present

## 2021-04-30 ENCOUNTER — Encounter: Payer: Self-pay | Admitting: Nurse Practitioner

## 2021-04-30 DIAGNOSIS — J3081 Allergic rhinitis due to animal (cat) (dog) hair and dander: Secondary | ICD-10-CM | POA: Diagnosis not present

## 2021-04-30 DIAGNOSIS — Z1231 Encounter for screening mammogram for malignant neoplasm of breast: Secondary | ICD-10-CM | POA: Diagnosis not present

## 2021-04-30 DIAGNOSIS — M85851 Other specified disorders of bone density and structure, right thigh: Secondary | ICD-10-CM | POA: Diagnosis not present

## 2021-04-30 DIAGNOSIS — J301 Allergic rhinitis due to pollen: Secondary | ICD-10-CM | POA: Diagnosis not present

## 2021-04-30 DIAGNOSIS — M85852 Other specified disorders of bone density and structure, left thigh: Secondary | ICD-10-CM | POA: Diagnosis not present

## 2021-04-30 DIAGNOSIS — J3089 Other allergic rhinitis: Secondary | ICD-10-CM | POA: Diagnosis not present

## 2021-04-30 DIAGNOSIS — J4540 Moderate persistent asthma, uncomplicated: Secondary | ICD-10-CM | POA: Diagnosis not present

## 2021-05-03 DIAGNOSIS — K5900 Constipation, unspecified: Secondary | ICD-10-CM | POA: Diagnosis not present

## 2021-05-03 DIAGNOSIS — R159 Full incontinence of feces: Secondary | ICD-10-CM | POA: Diagnosis not present

## 2021-05-04 ENCOUNTER — Telehealth: Payer: Self-pay | Admitting: Nurse Practitioner

## 2021-05-04 NOTE — Telephone Encounter (Signed)
Please call pt in regards to her bone density-report is under media tab Shows osteopenia- low bone mass but has improved since scan. Continue cal and vit d with weight bearing exercises 30 mins 5 days a week as tolerates.

## 2021-05-04 NOTE — Telephone Encounter (Signed)
Patient notified and agreed.  

## 2021-05-06 DIAGNOSIS — J301 Allergic rhinitis due to pollen: Secondary | ICD-10-CM | POA: Diagnosis not present

## 2021-05-06 DIAGNOSIS — J3081 Allergic rhinitis due to animal (cat) (dog) hair and dander: Secondary | ICD-10-CM | POA: Diagnosis not present

## 2021-05-06 DIAGNOSIS — J3089 Other allergic rhinitis: Secondary | ICD-10-CM | POA: Diagnosis not present

## 2021-05-09 ENCOUNTER — Other Ambulatory Visit: Payer: Self-pay | Admitting: Nurse Practitioner

## 2021-05-10 ENCOUNTER — Other Ambulatory Visit: Payer: Self-pay | Admitting: Family

## 2021-05-10 NOTE — Telephone Encounter (Signed)
Patient has request refill on medication "Losartan '25mg'$ ". Patient last refill was 08/10/2020. Patient medication is requesting alternative medication. Message routed to PCP Dewaine Oats, Carlos American, NP . Please Advise.

## 2021-05-12 DIAGNOSIS — J3089 Other allergic rhinitis: Secondary | ICD-10-CM | POA: Diagnosis not present

## 2021-05-12 DIAGNOSIS — J3081 Allergic rhinitis due to animal (cat) (dog) hair and dander: Secondary | ICD-10-CM | POA: Diagnosis not present

## 2021-05-12 DIAGNOSIS — J301 Allergic rhinitis due to pollen: Secondary | ICD-10-CM | POA: Diagnosis not present

## 2021-05-17 ENCOUNTER — Other Ambulatory Visit: Payer: Self-pay | Admitting: Nurse Practitioner

## 2021-05-19 ENCOUNTER — Ambulatory Visit (INDEPENDENT_AMBULATORY_CARE_PROVIDER_SITE_OTHER): Payer: PPO | Admitting: Nurse Practitioner

## 2021-05-19 ENCOUNTER — Other Ambulatory Visit: Payer: Self-pay

## 2021-05-19 DIAGNOSIS — Z23 Encounter for immunization: Secondary | ICD-10-CM

## 2021-05-26 ENCOUNTER — Ambulatory Visit: Payer: PPO | Admitting: Podiatry

## 2021-05-26 ENCOUNTER — Encounter: Payer: Self-pay | Admitting: Podiatry

## 2021-05-26 ENCOUNTER — Other Ambulatory Visit: Payer: Self-pay

## 2021-05-26 DIAGNOSIS — M79674 Pain in right toe(s): Secondary | ICD-10-CM

## 2021-05-26 DIAGNOSIS — M79675 Pain in left toe(s): Secondary | ICD-10-CM

## 2021-05-26 DIAGNOSIS — B351 Tinea unguium: Secondary | ICD-10-CM

## 2021-05-26 DIAGNOSIS — I872 Venous insufficiency (chronic) (peripheral): Secondary | ICD-10-CM

## 2021-05-26 NOTE — Progress Notes (Signed)
This patient returns to my office for at risk foot care.  This patient requires this care by a professional since this patient will be at risk due to having venous insufficiency  This patient is unable to cut nails herself since the patient cannot reach her nails.These nails are painful walking and wearing shoes.  This patient presents for at risk foot care today.  General Appearance  Alert, conversant and in no acute stress.  Vascular  Dorsalis pedis and posterior tibial  pulses are palpable  bilaterally.  Capillary return is within normal limits  bilaterally. Temperature is within normal limits  bilaterally.  Neurologic  Senn-Weinstein monofilament wire test within normal limits  bilaterally. Muscle power within normal limits bilaterally.  Nails Thick disfigured discolored nails with subungual debris  from hallux to fifth toes bilaterally. No evidence of bacterial infection or drainage bilaterally.  Orthopedic  No limitations of motion  feet .  No crepitus or effusions noted.  HAV with hammer toes  B/L.  Skin  normotropic skin with no porokeratosis noted bilaterally.  No signs of infections or ulcers noted.     Onychomycosis  Pain in right toes  Pain in left toes  Consent was obtained for treatment procedures.   Mechanical debridement of nails 1-5  bilaterally performed with a nail nipper.  Filed with dremel without incident. No infection or ulcer.     Return office visit    12 weeks                Told patient to return for periodic foot care and evaluation due to potential at risk complications.   Landen Knoedler DPM  

## 2021-05-27 DIAGNOSIS — J301 Allergic rhinitis due to pollen: Secondary | ICD-10-CM | POA: Diagnosis not present

## 2021-05-27 DIAGNOSIS — J3089 Other allergic rhinitis: Secondary | ICD-10-CM | POA: Diagnosis not present

## 2021-05-27 DIAGNOSIS — J3081 Allergic rhinitis due to animal (cat) (dog) hair and dander: Secondary | ICD-10-CM | POA: Diagnosis not present

## 2021-05-31 DIAGNOSIS — H40023 Open angle with borderline findings, high risk, bilateral: Secondary | ICD-10-CM | POA: Diagnosis not present

## 2021-06-10 DIAGNOSIS — J301 Allergic rhinitis due to pollen: Secondary | ICD-10-CM | POA: Diagnosis not present

## 2021-06-10 DIAGNOSIS — J3081 Allergic rhinitis due to animal (cat) (dog) hair and dander: Secondary | ICD-10-CM | POA: Diagnosis not present

## 2021-06-10 DIAGNOSIS — J3089 Other allergic rhinitis: Secondary | ICD-10-CM | POA: Diagnosis not present

## 2021-06-21 DIAGNOSIS — L820 Inflamed seborrheic keratosis: Secondary | ICD-10-CM | POA: Diagnosis not present

## 2021-06-23 ENCOUNTER — Other Ambulatory Visit: Payer: Self-pay | Admitting: Nurse Practitioner

## 2021-07-02 DIAGNOSIS — J3081 Allergic rhinitis due to animal (cat) (dog) hair and dander: Secondary | ICD-10-CM | POA: Diagnosis not present

## 2021-07-02 DIAGNOSIS — J3089 Other allergic rhinitis: Secondary | ICD-10-CM | POA: Diagnosis not present

## 2021-07-02 DIAGNOSIS — J301 Allergic rhinitis due to pollen: Secondary | ICD-10-CM | POA: Diagnosis not present

## 2021-07-23 DIAGNOSIS — J3089 Other allergic rhinitis: Secondary | ICD-10-CM | POA: Diagnosis not present

## 2021-07-23 DIAGNOSIS — J301 Allergic rhinitis due to pollen: Secondary | ICD-10-CM | POA: Diagnosis not present

## 2021-07-23 DIAGNOSIS — J3081 Allergic rhinitis due to animal (cat) (dog) hair and dander: Secondary | ICD-10-CM | POA: Diagnosis not present

## 2021-08-11 DIAGNOSIS — J3089 Other allergic rhinitis: Secondary | ICD-10-CM | POA: Diagnosis not present

## 2021-08-11 DIAGNOSIS — J3081 Allergic rhinitis due to animal (cat) (dog) hair and dander: Secondary | ICD-10-CM | POA: Diagnosis not present

## 2021-08-11 DIAGNOSIS — J301 Allergic rhinitis due to pollen: Secondary | ICD-10-CM | POA: Diagnosis not present

## 2021-08-13 ENCOUNTER — Telehealth: Payer: Self-pay | Admitting: Nurse Practitioner

## 2021-08-13 NOTE — Telephone Encounter (Signed)
Called, left pt a vm to call back and schedule next AWV

## 2021-08-17 ENCOUNTER — Other Ambulatory Visit: Payer: Self-pay | Admitting: Nurse Practitioner

## 2021-08-17 DIAGNOSIS — E785 Hyperlipidemia, unspecified: Secondary | ICD-10-CM

## 2021-08-17 DIAGNOSIS — I10 Essential (primary) hypertension: Secondary | ICD-10-CM

## 2021-08-19 ENCOUNTER — Encounter: Payer: Self-pay | Admitting: Nurse Practitioner

## 2021-08-19 ENCOUNTER — Other Ambulatory Visit: Payer: Self-pay

## 2021-08-19 ENCOUNTER — Ambulatory Visit (INDEPENDENT_AMBULATORY_CARE_PROVIDER_SITE_OTHER): Payer: PPO | Admitting: Nurse Practitioner

## 2021-08-19 ENCOUNTER — Telehealth: Payer: Self-pay

## 2021-08-19 DIAGNOSIS — Z0000 Encounter for general adult medical examination without abnormal findings: Secondary | ICD-10-CM | POA: Diagnosis not present

## 2021-08-19 NOTE — Telephone Encounter (Signed)
Ms. Wendy Figueroa, Wendy Figueroa are scheduled for a virtual visit with your provider today.    Just as we do with appointments in the office, we must obtain your consent to participate.  Your consent will be active for this visit and any virtual visit you may have with one of our providers in the next 365 days.    If you have a MyChart account, I can also send a copy of this consent to you electronically.  All virtual visits are billed to your insurance company just like a traditional visit in the office.  As this is a virtual visit, video technology does not allow for your provider to perform a traditional examination.  This may limit your provider's ability to fully assess your condition.  If your provider identifies any concerns that need to be evaluated in person or the need to arrange testing such as labs, EKG, etc, we will make arrangements to do so.    Although advances in technology are sophisticated, we cannot ensure that it will always work on either your end or our end.  If the connection with a video visit is poor, we may have to switch to a telephone visit.  With either a video or telephone visit, we are not always able to ensure that we have a secure connection.   I need to obtain your verbal consent now.   Are you willing to proceed with your visit today?   Wendy Figueroa has provided verbal consent on 08/19/2021 for a virtual visit (video or telephone).   Carroll Kinds, CMA 08/19/2021  11:14 AM

## 2021-08-19 NOTE — Progress Notes (Signed)
This service is provided via telemedicine  No vital signs collected/recorded due to the encounter was a telemedicine visit.   Location of patient (ex: home, work):  Home  Patient consents to a telephone visit:  Yes, see encounter dated 08/19/2021  Location of the provider (ex: office, home):  Home  Name of any referring provider:  N/A  Names of all persons participating in the telemedicine service and their role in the encounter:  Sherrie Mustache, Nurse Practitioner, Carroll Kinds, CMA, and patient.   Time spent on call:  12 minutes with medical assistant

## 2021-08-19 NOTE — Progress Notes (Signed)
Subjective:   Wendy Figueroa is a 75 y.o. female who presents for Medicare Annual (Subsequent) preventive examination.  Review of Systems           Objective:    There were no vitals filed for this visit. There is no height or weight on file to calculate BMI.  Advanced Directives 02/19/2021 08/19/2020 05/12/2020 02/17/2020 08/19/2019 05/15/2019 05/06/2019  Does Patient Have a Medical Advance Directive? Yes No No Yes No No No  Type of Advance Directive Out of facility DNR (pink MOST or yellow form) - - Living will - - -  Does patient want to make changes to medical advance directive? No - Patient declined - - No - Patient declined Yes (MAU/Ambulatory/Procedural Areas - Information given) - -  Would patient like information on creating a medical advance directive? - Yes (MAU/Ambulatory/Procedural Areas - Information given) - - - No - Patient declined Yes (MAU/Ambulatory/Procedural Areas - Information given)  Pre-existing out of facility DNR order (yellow form or pink MOST form) Pink MOST form placed in chart (order not valid for inpatient use) - - - - - -    Current Medications (verified) Outpatient Encounter Medications as of 08/19/2021  Medication Sig   albuterol (PROVENTIL HFA;VENTOLIN HFA) 108 (90 Base) MCG/ACT inhaler Inhale 2 puffs into the lungs every 6 (six) hours as needed for wheezing or shortness of breath.   budesonide-formoterol (SYMBICORT) 160-4.5 MCG/ACT inhaler Inhale 2 puffs into the lungs 2 (two) times daily.   Calcium Carb-Cholecalciferol (CALCIUM 600 + D PO) Take 1 tablet by mouth 2 (two) times daily.    cetirizine (ZYRTEC) 10 MG tablet Take 10 mg by mouth daily.   chlorpheniramine-HYDROcodone (TUSSIONEX PENNKINETIC ER) 10-8 MG/5ML SUER Take 5 mLs by mouth every 12 (twelve) hours as needed for cough.   DULoxetine (CYMBALTA) 60 MG capsule TAKE 1 CAPSULE (60 MG TOTAL) BY MOUTH DAILY. DX (F43.22)   EPINEPHrine 0.3 mg/0.3 mL IJ SOAJ injection epinephrine 0.3 mg/0.3 mL  injection, auto-injector   fluticasone (FLONASE) 50 MCG/ACT nasal spray 2 sprays daily.   furosemide (LASIX) 40 MG tablet TAKE 1 TABLET BY MOUTH EVERY DAY   levocetirizine (XYZAL) 5 MG tablet Take 1 tablet (5 mg total) by mouth every evening.   losartan (COZAAR) 25 MG tablet TAKE 3 TABLETS BY MOUTH EVERY DAY   montelukast (SINGULAIR) 10 MG tablet Take 10 mg by mouth at bedtime.   Multiple Vitamins-Minerals (MACULAR VITAMIN BENEFIT PO) Take 1 capsule by mouth 2 (two) times daily.   neomycin-polymyxin b-dexamethasone (MAXITROL) 3.5-10000-0.1 OINT Place 3.5 application into the left eye as needed.   NIFEdipine (PROCARDIA XL/NIFEDICAL XL) 60 MG 24 hr tablet TAKE 1 TABLET BY MOUTH EVERY DAY   NON FORMULARY Allergy shots once every other week   oxybutynin (DITROPAN) 5 MG tablet Take one tablet by mouth twice daily.   pantoprazole (PROTONIX) 40 MG tablet TAKE 1 TABLET (40 MG TOTAL) BY MOUTH 2 (TWO) TIMES DAILY. FOR STOMACH   potassium chloride SA (KLOR-CON M20) 20 MEQ tablet TAKE TWO TABLETS BY MOUTH ONCE DAILY FOR POTASSIUM SUPPLEMENT   pravastatin (PRAVACHOL) 40 MG tablet Take 1 tablet (40 mg total) by mouth daily.   psyllium (METAMUCIL) 58.6 % packet Take 1 packet by mouth daily as needed (constipation).   SPIRIVA HANDIHALER 18 MCG inhalation capsule Place 18 mcg into inhaler and inhale daily.    SYSTANE ULTRA 0.4-0.3 % SOLN SMARTSIG:1 Drop(s) In Eye(s) As Needed   vitamin B-12 (CYANOCOBALAMIN) 1000 MCG tablet Take  1,000 mcg by mouth daily.   Facility-Administered Encounter Medications as of 08/19/2021  Medication   0.9 %  sodium chloride infusion    Allergies (verified) Atenolol, Lisinopril, Meperidine hcl, Penicillin g sodium, Demerol, Lisinopril-hydrochlorothiazide, and Penicillins   History: Past Medical History:  Diagnosis Date   Abnormal Pap smear    Over 5yrs ago   ALKALINE PHOSPHATASE, ELEVATED 07/01/2009   Qualifier: Diagnosis of  By: [name] MD, [name]    Allergic rhinitis     Anemia    Arthritis    Asthma    Cataract    bilateral implants   Cough    occasional; 9-21: reports as ongoing cough , managing with cough medicine and scheduled and prn inhalers, reports mucus is clear and scant     Cystocele 11/05/08   Diverticulosis of colon    DJD (degenerative joint disease)    no per pt   Elevated alkaline phosphatase level    GERD (gastroesophageal reflux disease)    H/O urinary incontinence 11/06/2008   H/O varicella    Hard measles    Hx of colonic polyps    Hypercholesterolemia    Hypertension    Leg swelling    occasional   LPRD (laryngopharyngeal reflux disease) 05/29/2015   Overweight(278.02)    Rectocele 11/06/2008   Large   Sebaceous cyst    on back   Sinusitis    Venous insufficiency    Wheezing    occasional   Yeast infection    Past Surgical History:  Procedure Laterality Date   ABDOMINAL HYSTERECTOMY  1988   ANTERIOR AND POSTERIOR REPAIR  11/05/08   ANTERIOR AND POSTERIOR REPAIR N/A 05/15/2019   Procedure: ANTERIOR (CYSTOCELE) AND POSTERIOR REPAIR (RECTOCELE);  Surgeon: Everett Graff, MD;  Location: [facility];  Service: Gynecology;  Laterality: N/A;   APOGEE / PERIGEE REPAIR  10/2008   Dr. Octavio Manns   BLADDER SUSPENSION N/A 05/15/2019   Procedure: TRANSVAGINAL TAPE (TVT) PROCEDURE;  Surgeon: Everett Graff, MD;  Location: [facility];  Service: Gynecology;  Laterality: N/A;   bladder tack  2011   BREAST SURGERY  1978   cyst removed from both removed   cataracts   approx 10 years   Dr Johnnye Sima , ctaract with lens placement    COLONOSCOPY     CYSTOSCOPY N/A 05/15/2019   Procedure: CYSTOSCOPY;  Surgeon: Everett Graff, MD;  Location: [facility];  Service: Gynecology;  Laterality: N/A;   ESOPHAGOGASTRODUODENOSCOPY     Family History  Problem Relation Age of Onset   Aneurysm Mother    Lung cancer Father    Hypertension Daughter    Hypertension Daughter    Colon cancer Neg Hx     Esophageal cancer Neg Hx    Stomach cancer Neg Hx    Rectal cancer Neg Hx    Colon polyps Neg Hx    Social History   Socioeconomic History   Marital status: Married    Spouse name: [name]   Number of children: 2   Years of education: Not on file   Highest education level: Not on file  Occupational History   Occupation: LPN   Tobacco Use   Smoking status: Never   Smokeless tobacco: Never  Vaping Use   Vaping Use: Never used  Substance and Sexual Activity   Alcohol use: Yes    Alcohol/week: 2.0 standard drinks    Types: 2 Cans of beer per week    Comment: 2 beers  on the weekend   Drug use: No   Sexual activity: Not Currently    Partners: Male    Birth control/protection: None  Other Topics Concern   Not on file  Social History Narrative   Married   Never smoked   Alcohol few beers on week-end   No Advance Directive    Social Determinants of Health   Financial Resource Strain: Not on file  Food Insecurity: Not on file  Transportation Needs: Not on file  Physical Activity: Not on file  Stress: Not on file  Social Connections: Not on file    Tobacco Counseling Counseling given: Not Answered   Clinical Intake:                 Diabetic?no         Activities of Daily Living No flowsheet data found.  Patient Care Team: Lauree Chandler, NP as PCP - General (Geriatric Medicine) Monna Fam, MD as Consulting Physician (Ophthalmology)  Indicate any recent Medical Services you may have received from other than [facility] providers in the past year (date may be approximate).     Assessment:   This is a routine wellness examination for [place].  Hearing/Vision screen No results found.  Dietary issues and exercise activities discussed:     Goals Addressed   None    Depression Screen PHQ 2/9 Scores 08/19/2020 05/12/2020 02/17/2020 08/19/2019 05/06/2019 04/30/2018 08/29/2017  PHQ - 2 Score 0 0 0 0 0 0 0    Fall Risk Fall Risk  02/19/2021  08/19/2020 05/12/2020 02/17/2020 08/19/2019  Falls in the past year? 0 0 0 0 0  Number falls in past yr: 0 0 0 0 0  Injury with Fall? 0 0 0 0 0  Risk for fall due to : No Fall Risks - - - -  Follow up Falls evaluation completed - - - -    FALL RISK PREVENTION PERTAINING TO THE HOME:  Any stairs in or around the home? Yes  If so, are there any without handrails? No  Home free of loose throw rugs in walkways, pet beds, electrical cords, etc? Yes  Adequate lighting in your home to reduce risk of falls? Yes   ASSISTIVE DEVICES UTILIZED TO PREVENT FALLS:  Life alert? No  Use of a cane, walker or w/c? No  Grab bars in the bathroom? Yes  Shower chair or bench in shower? Yes  Elevated toilet seat or a handicapped toilet? No   TIMED UP AND GO:  Was the test performed? No .    Cognitive Function: MMSE - Mini Mental State Exam 04/30/2018 01/18/2017 09/09/2016 05/29/2015  Orientation to time 4 5 4 5   Orientation to Place 5 5 5 5   Registration 3 3 3 3   Attention/ Calculation 4 5 3 5   Recall 2 2 2 3   Language- name 2 objects 2 2 2 2   Language- repeat 1 1 1 1   Language- follow 3 step command 3 3 2 3   Language- read & follow direction 1 1 1 1   Write a sentence 1 1 1 1   Copy design 1 1 1 1   Total score 27 29 25 30      6CIT Screen 05/12/2020 05/06/2019  What Year? 0 points 0 points  What month? 0 points 0 points  What time? 0 points 0 points  Count back from 20 0 points 0 points  Months in reverse 4 points 0 points  Repeat phrase 0 points 0 points  Total  Score 4 0    Immunizations Immunization History  Administered Date(s) Administered   Fluad Quad(high Dose 65+) 05/25/2020, 05/19/2021   Influenza Split 06/11/2011, 05/27/2013   Influenza Whole 07/05/2010, 04/23/2012   Influenza, High Dose Seasonal PF 04/30/2018, 06/06/2019   Influenza,inj,Quad PF,6+ Mos 05/29/2015, 05/06/2016, 05/17/2017   Influenza-Unspecified 05/22/2014, 10/26/2015   PFIZER(Purple Top)SARS-COV-2 Vaccination  09/27/2019, 10/18/2019, 06/02/2020   Pneumococcal Conjugate-13 10/14/2014   Pneumococcal Polysaccharide-23 07/29/2010   Tdap 02/28/2017    TDAP status: Up to date  Flu Vaccine status: Up to date  Pneumococcal vaccine status: Up to date  Covid-19 vaccine status: Information provided on how to obtain vaccines.   Qualifies for Shingles Vaccine? Yes   Zostavax completed No   Shingrix Completed?: No.    Education has been provided regarding the importance of this vaccine. Patient has been advised to call insurance company to determine out of pocket expense if they have not yet received this vaccine. Advised may also receive vaccine at local pharmacy or Health Dept. Verbalized acceptance and understanding.  Screening Tests Health Maintenance  Topic Date Due   Zoster Vaccines- Shingrix (1 of 2) Never done   COVID-19 Vaccine (4 - Booster for Pfizer series) 07/28/2020   TETANUS/TDAP  03/01/2027   Pneumonia Vaccine 58+ Years old  Completed   INFLUENZA VACCINE  Completed   DEXA SCAN  Completed   HPV VACCINES  Aged Out   COLONOSCOPY (Pts 45-48yrs Insurance coverage will need to be confirmed)  Discontinued   Hepatitis C Screening  Discontinued    Health Maintenance  Health Maintenance Due  Topic Date Due   Zoster Vaccines- Shingrix (1 of 2) Never done   COVID-19 Vaccine (4 - Booster for Pfizer series) 07/28/2020    Colorectal cancer screening: No longer required.   Mammogram status: No longer required due to age.  Bone Density status: Completed 9/22. Results reflect: Bone density results: OSTEOPENIA. Repeat every 2 years.  Lung Cancer Screening: (Low Dose CT Chest recommended if Age 57-80 years, 30 pack-year currently smoking OR have quit w/in 15years.) does not qualify.   Lung Cancer Screening Referral: na  Additional Screening:  Hepatitis C Screening: does qualify; Completed 2021   Vision Screening: Recommended annual ophthalmology exams for early detection of glaucoma and  other disorders of the eye. Is the patient up to date with their annual eye exam?  Yes  Who is the provider or what is the name of the office in which the patient attends annual eye exams? [facility] If pt is not established with a provider, would they like to be referred to a provider to establish care? No .   Dental Screening: Recommended annual dental exams for proper oral hygiene  Community Resource Referral / Chronic Care Management: CRR required this visit?  No   CCM required this visit?  No      Plan:     I have personally reviewed and noted the following in the patients chart:   Medical and social history Use of alcohol, tobacco or illicit drugs  Current medications and supplements including opioid prescriptions.  Functional ability and status Nutritional status Physical activity Advanced directives List of other physicians Hospitalizations, surgeries, and ER visits in previous 12 months Vitals Screenings to include cognitive, depression, and falls Referrals and appointments  In addition, I have reviewed and discussed with patient certain preventive protocols, quality metrics, and best practice recommendations. A written personalized care plan for preventive services as well as general preventive health recommendations were provided to  patient.     Lauree Chandler, NP   08/19/2021    Virtual Visit via Telephone Note  I connected [email or web address] on 08/19/21 at 11:00 AM EST by telephone and verified that I am speaking with the correct person using two identifiers.  Location: Patient: home Provider: home   I discussed the limitations, risks, security and privacy concerns of performing an evaluation and management service by telephone and the availability of in person appointments. I also discussed with the patient that there may be a patient responsible charge related to this service. The patient expressed understanding and agreed to proceed.   I discussed the  assessment and treatment plan with the patient. The patient was provided an opportunity to ask questions and all were answered. The patient agreed with the plan and demonstrated an understanding of the instructions.   The patient was advised to call back or seek an in-person evaluation if the symptoms worsen or if the condition fails to improve as anticipated.  I provided 16 minutes of non-face-to-face time during this encounter.  [employer]. Harle Battiest [employer] printed and mailed

## 2021-08-19 NOTE — Patient Instructions (Signed)
Wendy Figueroa , Thank you for taking time to come for your Medicare Wellness Visit. I appreciate your ongoing commitment to your health goals. Please review the following plan we discussed and let me know if I can assist you in the future.   Screening recommendations/referrals: Colonoscopy aged out Mammogram aged out Bone Density up to date Recommended yearly ophthalmology/optometry visit for glaucoma screening and checkup Recommended yearly dental visit for hygiene and checkup  Vaccinations: Influenza vaccine up to date Pneumococcal vaccine up to date Tdap vaccine up to date Shingles vaccine RECOMMENDED- to get at local pharmacy COVID booster- up to date    Advanced directives: up to date  Conditions/risks identified: obesity, advance age, sedentary lifestyle   Next appointment: yearly    Preventive Care 75 Years and Older, Female Preventive care refers to lifestyle choices and visits with your health care provider that can promote health and wellness. What does preventive care include? A yearly physical exam. This is also called an annual well check. Dental exams once or twice a year. Routine eye exams. Ask your health care provider how often you should have your eyes checked. Personal lifestyle choices, including: Daily care of your teeth and gums. Regular physical activity. Eating a healthy diet. Avoiding tobacco and drug use. Limiting alcohol use. Practicing safe sex. Taking low-dose aspirin every day. Taking vitamin and mineral supplements as recommended by your health care provider. What happens during an annual well check? The services and screenings done by your health care provider during your annual well check will depend on your age, overall health, lifestyle risk factors, and family history of disease. Counseling  Your health care provider may ask you questions about your: Alcohol use. Tobacco use. Drug use. Emotional well-being. Home and relationship  well-being. Sexual activity. Eating habits. History of falls. Memory and ability to understand (cognition). Work and work Statistician. Reproductive health. Screening  You may have the following tests or measurements: Height, weight, and BMI. Blood pressure. Lipid and cholesterol levels. These may be checked every 5 years, or more frequently if you are over 8 years old. Skin check. Lung cancer screening. You may have this screening every year starting at age 35 if you have a 30-pack-year history of smoking and currently smoke or have quit within the past 15 years. Fecal occult blood test (FOBT) of the stool. You may have this test every year starting at age 72. Flexible sigmoidoscopy or colonoscopy. You may have a sigmoidoscopy every 5 years or a colonoscopy every 10 years starting at age 30. Hepatitis C blood test. Hepatitis B blood test. Sexually transmitted disease (STD) testing. Diabetes screening. This is done by checking your blood sugar (glucose) after you have not eaten for a while (fasting). You may have this done every 1-3 years. Bone density scan. This is done to screen for osteoporosis. You may have this done starting at age 54. Mammogram. This may be done every 1-2 years. Talk to your health care provider about how often you should have regular mammograms. Talk with your health care provider about your test results, treatment options, and if necessary, the need for more tests. Vaccines  Your health care provider may recommend certain vaccines, such as: Influenza vaccine. This is recommended every year. Tetanus, diphtheria, and acellular pertussis (Tdap, Td) vaccine. You may need a Td booster every 10 years. Zoster vaccine. You may need this after age 1. Pneumococcal 13-valent conjugate (PCV13) vaccine. One dose is recommended after age 34. Pneumococcal polysaccharide (PPSV23) vaccine. One dose  is recommended after age 70. Talk to your health care provider about which  screenings and vaccines you need and how often you need them. This information is not intended to replace advice given to you by your health care provider. Make sure you discuss any questions you have with your health care provider. Document Released: 09/04/2015 Document Revised: 04/27/2016 Document Reviewed: 06/09/2015 Elsevier Interactive Patient Education  2017 [place] Prevention in the Home Falls can cause injuries. They can happen to people of all ages. There are many things you can do to make your home safe and to help prevent falls. What can I do on the outside of my home? Regularly fix the edges of walkways and driveways and fix any cracks. Remove anything that might make you trip as you walk through a door, such as a raised step or threshold. Trim any bushes or trees on the path to your home. Use bright outdoor lighting. Clear any walking paths of anything that might make someone trip, such as rocks or tools. Regularly check to see if handrails are loose or broken. Make sure that both sides of any steps have handrails. Any raised decks and porches should have guardrails on the edges. Have any leaves, snow, or ice cleared regularly. Use sand or salt on walking paths during winter. Clean up any spills in your garage right away. This includes oil or grease spills. What can I do in the bathroom? Use night lights. Install grab bars by the toilet and in the tub and shower. Do not use towel bars as grab bars. Use non-skid mats or decals in the tub or shower. If you need to sit down in the shower, use a plastic, non-slip stool. Keep the floor dry. Clean up any water that spills on the floor as soon as it happens. Remove soap buildup in the tub or shower regularly. Attach bath mats securely with double-sided non-slip rug tape. Do not have throw rugs and other things on the floor that can make you trip. What can I do in the bedroom? Use night lights. Make sure that you have a  light by your bed that is easy to reach. Do not use any sheets or blankets that are too big for your bed. They should not hang down onto the floor. Have a firm chair that has side arms. You can use this for support while you get dressed. Do not have throw rugs and other things on the floor that can make you trip. What can I do in the kitchen? Clean up any spills right away. Avoid walking on wet floors. Keep items that you use a lot in easy-to-reach places. If you need to reach something above you, use a strong step stool that has a grab bar. Keep electrical cords out of the way. Do not use floor polish or wax that makes floors slippery. If you must use wax, use non-skid floor wax. Do not have throw rugs and other things on the floor that can make you trip. What can I do with my stairs? Do not leave any items on the stairs. Make sure that there are handrails on both sides of the stairs and use them. Fix handrails that are broken or loose. Make sure that handrails are as long as the stairways. Check any carpeting to make sure that it is firmly attached to the stairs. Fix any carpet that is loose or worn. Avoid having throw rugs at the top or bottom of the stairs.  If you do have throw rugs, attach them to the floor with carpet tape. Make sure that you have a light switch at the top of the stairs and the bottom of the stairs. If you do not have them, ask someone to add them for you. What else can I do to help prevent falls? Wear shoes that: Do not have high heels. Have rubber bottoms. Are comfortable and fit you well. Are closed at the toe. Do not wear sandals. If you use a stepladder: Make sure that it is fully opened. Do not climb a closed stepladder. Make sure that both sides of the stepladder are locked into place. Ask someone to hold it for you, if possible. Clearly mark and make sure that you can see: Any grab bars or handrails. First and last steps. Where the edge of each step  is. Use tools that help you move around (mobility aids) if they are needed. These include: Canes. Walkers. Scooters. Crutches. Turn on the lights when you go into a dark area. Replace any light bulbs as soon as they burn out. Set up your furniture so you have a clear path. Avoid moving your furniture around. If any of your floors are uneven, fix them. If there are any pets around you, be aware of where they are. Review your medicines with your doctor. Some medicines can make you feel dizzy. This can increase your chance of falling. Ask your doctor what other things that you can do to help prevent falls. This information is not intended to replace advice given to you by your health care provider. Make sure you discuss any questions you have with your health care provider. Document Released: 06/04/2009 Document Revised: 01/14/2016 Document Reviewed: 09/12/2014 Elsevier Interactive Patient Education  2017 [employer].

## 2021-08-24 ENCOUNTER — Other Ambulatory Visit: Payer: PPO

## 2021-08-24 ENCOUNTER — Other Ambulatory Visit: Payer: Self-pay

## 2021-08-24 DIAGNOSIS — I10 Essential (primary) hypertension: Secondary | ICD-10-CM

## 2021-08-24 DIAGNOSIS — E785 Hyperlipidemia, unspecified: Secondary | ICD-10-CM

## 2021-08-24 LAB — CBC WITH DIFFERENTIAL/PLATELET
Absolute Monocytes: 441 cells/uL (ref 200–950)
Basophils Absolute: 52 cells/uL (ref 0–200)
Basophils Relative: 0.9 %
Eosinophils Absolute: 220 cells/uL (ref 15–500)
Eosinophils Relative: 3.8 %
HCT: 37.1 % (ref 35.0–45.0)
Hemoglobin: 11.7 g/dL (ref 11.7–15.5)
Lymphs Abs: 2697 cells/uL (ref 850–3900)
MCH: 26.2 pg — ABNORMAL LOW (ref 27.0–33.0)
MCHC: 31.5 g/dL — ABNORMAL LOW (ref 32.0–36.0)
MCV: 83.2 fL (ref 80.0–100.0)
MPV: 11.3 fL (ref 7.5–12.5)
Monocytes Relative: 7.6 %
Neutro Abs: 2390 cells/uL (ref 1500–7800)
Neutrophils Relative %: 41.2 %
Platelets: 277 10*3/uL (ref 140–400)
RBC: 4.46 10*6/uL (ref 3.80–5.10)
RDW: 13.9 % (ref 11.0–15.0)
Total Lymphocyte: 46.5 %
WBC: 5.8 10*3/uL (ref 3.8–10.8)

## 2021-08-24 LAB — COMPLETE METABOLIC PANEL WITH GFR
AG Ratio: 1.1 (calc) (ref 1.0–2.5)
ALT: 16 U/L (ref 6–29)
AST: 21 U/L (ref 10–35)
Albumin: 4 g/dL (ref 3.6–5.1)
Alkaline phosphatase (APISO): 152 U/L (ref 37–153)
BUN: 13 mg/dL (ref 7–25)
CO2: 29 mmol/L (ref 20–32)
Calcium: 9.9 mg/dL (ref 8.6–10.4)
Chloride: 107 mmol/L (ref 98–110)
Creat: 0.9 mg/dL (ref 0.60–1.00)
Globulin: 3.6 g/dL (calc) (ref 1.9–3.7)
Glucose, Bld: 105 mg/dL — ABNORMAL HIGH (ref 65–99)
Potassium: 4.7 mmol/L (ref 3.5–5.3)
Sodium: 142 mmol/L (ref 135–146)
Total Bilirubin: 0.5 mg/dL (ref 0.2–1.2)
Total Protein: 7.6 g/dL (ref 6.1–8.1)
eGFR: 66 mL/min/{1.73_m2} (ref >=60)

## 2021-08-24 LAB — LIPID PANEL
Cholesterol: 177 mg/dL (ref <200)
HDL: 63 mg/dL (ref >=50)
LDL Cholesterol (Calc): 99 mg/dL (calc)
Non-HDL Cholesterol (Calc): 114 mg/dL (calc) (ref <130)
Total CHOL/HDL Ratio: 2.8 (calc) (ref <5.0)
Triglycerides: 61 mg/dL (ref <150)

## 2021-08-27 ENCOUNTER — Ambulatory Visit (INDEPENDENT_AMBULATORY_CARE_PROVIDER_SITE_OTHER): Payer: PPO | Admitting: Nurse Practitioner

## 2021-08-27 ENCOUNTER — Other Ambulatory Visit: Payer: Self-pay

## 2021-08-27 ENCOUNTER — Encounter: Payer: Self-pay | Admitting: Nurse Practitioner

## 2021-08-27 VITALS — BP 146/84 | HR 80 | Temp 97.8°F | Ht 64.0 in | Wt 219.0 lb

## 2021-08-27 DIAGNOSIS — N3281 Overactive bladder: Secondary | ICD-10-CM

## 2021-08-27 DIAGNOSIS — K219 Gastro-esophageal reflux disease without esophagitis: Secondary | ICD-10-CM

## 2021-08-27 DIAGNOSIS — R053 Chronic cough: Secondary | ICD-10-CM | POA: Diagnosis not present

## 2021-08-27 DIAGNOSIS — J3089 Other allergic rhinitis: Secondary | ICD-10-CM | POA: Diagnosis not present

## 2021-08-27 DIAGNOSIS — I10 Essential (primary) hypertension: Secondary | ICD-10-CM

## 2021-08-27 DIAGNOSIS — J3081 Allergic rhinitis due to animal (cat) (dog) hair and dander: Secondary | ICD-10-CM | POA: Diagnosis not present

## 2021-08-27 DIAGNOSIS — J301 Allergic rhinitis due to pollen: Secondary | ICD-10-CM | POA: Diagnosis not present

## 2021-08-27 DIAGNOSIS — E785 Hyperlipidemia, unspecified: Secondary | ICD-10-CM | POA: Diagnosis not present

## 2021-08-27 MED ORDER — HYDROCOD POLST-CPM POLST ER 10-8 MG/5ML PO SUER: 5.0000 mL | Freq: Two times a day (BID) | ORAL | 0 refills | Status: DC | PRN

## 2021-08-27 NOTE — Progress Notes (Signed)
Careteam: Patient Care Team: Lauree Chandler, NP as PCP - General (Geriatric Medicine) Monna Fam, MD as Consulting Physician (Ophthalmology)  PLACE OF SERVICE:  [place] Directive information Does Patient Have a Medical Advance Directive?: Yes, Type of Advance Directive: Out of facility DNR (pink MOST or yellow form), Pre-existing out of facility DNR order (yellow form or pink MOST form): Pink MOST form placed in chart (order not valid for inpatient use), Does patient want to make changes to medical advance directive?: No - Patient declined  Allergies  Allergen Reactions   Atenolol Other (See Comments)   Lisinopril Other (See Comments)   Meperidine Hcl Other (See Comments)   Penicillin G Sodium Other (See Comments)   Demerol Hives    All over the body    Lisinopril-Hydrochlorothiazide Hives    All over the body   Penicillins Hives    Has patient had a PCN reaction causing immediate rash, facial/tongue/throat swelling, SOB or lightheadedness with hypotension: No Has patient had a PCN reaction causing severe rash involving mucus membranes or skin necrosis: No Has patient had a PCN reaction that required hospitalization: No Has patient had a PCN reaction occurring within the last 10 years: No If all of the above answers are "NO", then may proceed with Cephalosporin use.   All over the body    Chief Complaint  Patient presents with   Medical Management of Chronic Issues    6 month follow-up and discuss need for shingrix or post pone if patient refuses (if vaccines are up to date patient will need to provide documentation).     HPI: Patient is a 76 y.o. female for routine follow up.   Pt reports the holidays were too much -gained weight due to the sweets. She does walk.   Chronic cough- stable, continues ot use tussinex PRN (tries to use rarely but will use weekly when going to church or in a meeting) .   OAB- stable on oxybutynin.   Hyperlipidemia-  on pravastatin, likes to sweets   Htn- blood pressure generally better at home.  Review of Systems:  Review of Systems  Constitutional:  Negative for chills, fever and weight loss.  HENT:  Negative for tinnitus.   Respiratory:  Positive for cough. Negative for sputum production and shortness of breath.   Cardiovascular:  Negative for chest pain, palpitations and leg swelling.  Gastrointestinal:  Negative for abdominal pain, constipation, diarrhea and heartburn.  Genitourinary:  Negative for dysuria, frequency and urgency.  Musculoskeletal:  Negative for back pain, falls, joint pain and myalgias.  Skin: Negative.   Neurological:  Negative for dizziness and headaches.  Psychiatric/Behavioral:  Negative for depression and memory loss. The patient does not have insomnia.    Past Medical History:  Diagnosis Date   Abnormal Pap smear    Over 28yrs ago   ALKALINE PHOSPHATASE, ELEVATED 07/01/2009   Qualifier: Diagnosis of  By: [name] MD, [name]    Allergic rhinitis    Anemia    Arthritis    Asthma    Cataract    bilateral implants   Cough    occasional; 9-21: reports as ongoing cough , managing with cough medicine and scheduled and prn inhalers, reports mucus is clear and scant     Cystocele 11/05/08   Diverticulosis of colon    DJD (degenerative joint disease)    no per pt   Elevated alkaline phosphatase level    GERD (gastroesophageal reflux disease)  H/O urinary incontinence 11/06/2008   H/O varicella    Hard measles    Hx of colonic polyps    Hypercholesterolemia    Hypertension    Leg swelling    occasional   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 Octavio Manns   BLADDER SUSPENSION N/A 05/15/2019   Procedure: TRANSVAGINAL TAPE (TVT) PROCEDURE;  Surgeon: Everett Graff, MD;  Location: [facility];  Service: Gynecology;  Laterality: N/A;   bladder tack  2011   BREAST SURGERY  1978   cyst removed from both removed   cataracts   approx 10 years   Dr Johnnye Sima , ctaract with lens placement    COLONOSCOPY     CYSTOSCOPY N/A 05/15/2019   Procedure: CYSTOSCOPY;  Surgeon: Everett Graff, MD;  Location: [facility];  Service: Gynecology;  Laterality: N/A;   ESOPHAGOGASTRODUODENOSCOPY     Social History:   reports that she has never smoked. She has never used smokeless tobacco. She reports current alcohol use of about 2.0 standard drinks per week. She reports that she does not use drugs.  Family History  Problem Relation Age of Onset   Aneurysm Mother    Lung cancer Father    Hypertension Daughter    Hypertension Daughter    Colon cancer Neg Hx    Esophageal cancer Neg Hx    Stomach cancer Neg Hx    Rectal cancer Neg Hx    Colon polyps Neg Hx     Medications: Patient's Medications  New Prescriptions   No medications on file  Previous Medications   ALBUTEROL (PROVENTIL HFA;VENTOLIN HFA) 108 (90 BASE) MCG/ACT INHALER    Inhale 2 puffs into the lungs every 6 (six) hours as needed for wheezing or shortness of breath.   BUDESONIDE-FORMOTEROL (SYMBICORT) 160-4.5 MCG/ACT INHALER    Inhale 2 puffs into the lungs 2 (two) times daily.   CALCIUM CARB-CHOLECALCIFEROL (CALCIUM 600 + D PO)    Take 1 tablet by mouth 2 (two) times daily.    CETIRIZINE (ZYRTEC) 10 MG TABLET    Take 10 mg by mouth daily.   CHLORPHENIRAMINE-HYDROCODONE (TUSSIONEX PENNKINETIC ER) 10-8 MG/5ML SUER     Take 5 mLs by mouth every 12 (twelve) hours as needed for cough.   DULOXETINE (CYMBALTA) 60 MG CAPSULE    TAKE 1 CAPSULE (60 MG TOTAL) BY MOUTH DAILY. DX (F43.22)   EPINEPHRINE 0.3 MG/0.3 ML IJ SOAJ INJECTION    epinephrine 0.3 mg/0.3 mL injection, auto-injector   FLUTICASONE (FLONASE) 50 MCG/ACT NASAL SPRAY    2 sprays daily.   FUROSEMIDE (LASIX) 40 MG TABLET    TAKE 1 TABLET BY MOUTH EVERY DAY   LEVOCETIRIZINE (XYZAL) 5 MG TABLET    Take 1 tablet (5 mg total) by mouth every evening.   LOSARTAN (COZAAR) 25 MG TABLET    TAKE 3 TABLETS BY  MOUTH EVERY DAY   MONTELUKAST (SINGULAIR) 10 MG TABLET    Take 10 mg by mouth at bedtime.   MULTIPLE VITAMINS-MINERALS (MACULAR VITAMIN BENEFIT PO)    Take 1 capsule by mouth 2 (two) times daily.   NEOMYCIN-POLYMYXIN B-DEXAMETHASONE (MAXITROL) 3.5-10000-0.1 OINT    Place 3.5 application into the left eye as needed.   NIFEDIPINE (PROCARDIA XL/NIFEDICAL XL) 60 MG 24 HR TABLET    TAKE 1 TABLET BY MOUTH EVERY DAY   NON FORMULARY    Allergy shots once every other week   OXYBUTYNIN (DITROPAN) 5 MG TABLET    Take one tablet by mouth twice daily.   PANTOPRAZOLE (PROTONIX) 40 MG TABLET    TAKE 1 TABLET (40 MG TOTAL) BY MOUTH 2 (TWO) TIMES DAILY. FOR STOMACH   POTASSIUM CHLORIDE SA (KLOR-CON M20) 20 MEQ TABLET    TAKE TWO TABLETS BY MOUTH ONCE DAILY FOR POTASSIUM SUPPLEMENT   PRAVASTATIN (PRAVACHOL) 40 MG TABLET    Take 1 tablet (40 mg total) by mouth daily.   SPIRIVA HANDIHALER 18 MCG INHALATION CAPSULE    Place 18 mcg into inhaler and inhale daily.    SYSTANE ULTRA 0.4-0.3 % SOLN    SMARTSIG:1 Drop(s) In Eye(s) As Needed   VITAMIN B-12 (CYANOCOBALAMIN) 1000 MCG TABLET    Take 1,000 mcg by mouth daily.   WHEAT DEXTRIN (BENEFIBER PO)    Take by mouth as needed.  Modified Medications   No medications on file  Discontinued Medications   No medications on file    Physical Exam:  Vitals:   08/27/21 1033  BP: (!) 146/84  Pulse: 80  Temp: 97.8 F (36.6 C)   TempSrc: Temporal  SpO2: 99%  Weight: 219 lb (99.3 kg)  Height: 5\' 4"  (1.626 m)   Body mass index is 37.59 kg/m. Wt Readings from Last 3 Encounters:  08/27/21 219 lb (99.3 kg)  02/19/21 215 lb (97.5 kg)  09/11/20 227 lb (103 kg)    Physical Exam Constitutional:      General: She is not in acute distress.    Appearance: She is well-developed. She is not diaphoretic.  HENT:     Head: Normocephalic and atraumatic.     Mouth/Throat:     Pharynx: No oropharyngeal exudate.  Eyes:     Conjunctiva/sclera: Conjunctivae normal.     Pupils: Pupils are equal, round, and reactive to light.  Cardiovascular:     Rate and Rhythm: Normal rate and regular rhythm.     Heart sounds: Normal heart sounds.  Pulmonary:     Effort: Pulmonary effort is normal.     Breath sounds: Normal breath sounds.  Abdominal:     General: Bowel sounds are normal.     Palpations: Abdomen is soft.  Musculoskeletal:     Cervical back: Normal range of motion and neck supple.     Right lower leg: No edema.     Left lower leg: No edema.  Skin:    General: Skin is warm and dry.  Neurological:     Mental Status: She is alert.  Psychiatric:        Mood and Affect: Mood normal.    Labs reviewed: Basic Metabolic Panel: Recent Labs    02/19/21 0000 08/24/21 0819  NA 139 142  K 4.7 4.7  CL 109 107  CO2 20 29  GLUCOSE 83 105*  BUN 15 13  CREATININE 0.93 0.90  CALCIUM 9.6 9.9   Liver Function Tests: Recent Labs  02/19/21 0000 08/24/21 0819  AST 21 21  ALT 13 16  BILITOT 0.5 0.5  PROT 7.2 7.6   No results for input(s): LIPASE, AMYLASE in the last 8760 hours. No results for input(s): AMMONIA in the last 8760 hours. CBC: Recent Labs    02/19/21 0000 08/24/21 0819  WBC 5.7 5.8  NEUTROABS 2,400 2,390  HGB 11.5* 11.7  HCT 36.3 37.1  MCV 82.9 83.2  PLT 266 277   Lipid Panel: Recent Labs    08/24/21 0819  CHOL 177  HDL 63  LDLCALC 99  TRIG 61  CHOLHDL 2.8   TSH: No results for  input(s): TSH in the last 8760 hours. A1C: Lab Results  Component Value Date   HGBA1C 5.6 08/12/2020     Assessment/Plan 1. Chronic cough -ongoing but improved with lifestyle modifications.  - chlorpheniramine-HYDROcodone (TUSSIONEX PENNKINETIC ER) 10-8 MG/5ML SUER; Take 5 mLs by mouth every 12 (twelve) hours as needed for cough.  Dispense: 300 mL; Refill: 0  2. Hyperlipidemia LDL goal <130 Continues on pravastatin, LDL at goal. Continue dietary modifications.   3. Essential hypertension --Blood pressure elevated today, but typically well controlled -Patient reports bp typically elevated in office and home blood pressures are well controlled -No changes to medications today  -will have pt continue to monitor home bp goal <003/70 -follow metabolic panel  4. Morbid obesity ([place]) -education provided on healthy weight loss through increase in physical activity and proper nutrition   5. Gastroesophageal reflux disease without esophagitis -stable on protonix, continue medication with dietary modifications.   6. Overactive bladder The current medical regimen is effective;  continue present plan and medications.  Next appt: 4 months.  Carlos American. [employer], [facility] [phone]

## 2021-08-27 NOTE — Patient Instructions (Addendum)
Go to the pharmacy for shingles vaccine  Get COVID booster at pharmacy   Continue to monitor bp at home  Goal <140/90

## 2021-08-31 ENCOUNTER — Ambulatory Visit: Payer: PPO | Admitting: Podiatry

## 2021-09-03 ENCOUNTER — Other Ambulatory Visit: Payer: Self-pay | Admitting: Nurse Practitioner

## 2021-09-03 DIAGNOSIS — F4322 Adjustment disorder with anxiety: Secondary | ICD-10-CM

## 2021-09-13 ENCOUNTER — Other Ambulatory Visit: Payer: Self-pay | Admitting: Nurse Practitioner

## 2021-09-13 DIAGNOSIS — I10 Essential (primary) hypertension: Secondary | ICD-10-CM

## 2021-09-15 ENCOUNTER — Ambulatory Visit: Payer: PPO | Admitting: Podiatry

## 2021-09-15 ENCOUNTER — Other Ambulatory Visit: Payer: Self-pay

## 2021-09-15 DIAGNOSIS — B351 Tinea unguium: Secondary | ICD-10-CM

## 2021-09-15 DIAGNOSIS — M79674 Pain in right toe(s): Secondary | ICD-10-CM | POA: Diagnosis not present

## 2021-09-15 DIAGNOSIS — M79675 Pain in left toe(s): Secondary | ICD-10-CM | POA: Diagnosis not present

## 2021-09-15 DIAGNOSIS — I872 Venous insufficiency (chronic) (peripheral): Secondary | ICD-10-CM | POA: Diagnosis not present

## 2021-09-15 NOTE — Progress Notes (Signed)
This patient returns to my office for at risk foot care.  This patient requires this care by a professional since this patient will be at risk due to having venous insufficiency  This patient is unable to cut nails herself since the patient cannot reach her nails.These nails are painful walking and wearing shoes.  This patient presents for at risk foot care today.  General Appearance  Alert, conversant and in no acute stress.  Vascular  Dorsalis pedis and posterior tibial  pulses are palpable  bilaterally.  Capillary return is within normal limits  bilaterally. Temperature is within normal limits  bilaterally.  Neurologic  Senn-Weinstein monofilament wire test within normal limits  bilaterally. Muscle power within normal limits bilaterally.  Nails Thick disfigured discolored nails with subungual debris  from hallux to fifth toes bilaterally. No evidence of bacterial infection or drainage bilaterally.  Orthopedic  No limitations of motion  feet .  No crepitus or effusions noted.  HAV with hammer toes  B/L.  Skin  normotropic skin with no porokeratosis noted bilaterally.  No signs of infections or ulcers noted.     Onychomycosis  Pain in right toes  Pain in left toes  Consent was obtained for treatment procedures.   Mechanical debridement of nails 1-5  bilaterally performed with a nail nipper.  Filed with dremel without incident. No infection or ulcer.     Return office visit    12 weeks                Told patient to return for periodic foot care and evaluation due to potential at risk complications.   Nabria Nevin DPM  

## 2021-09-17 DIAGNOSIS — J301 Allergic rhinitis due to pollen: Secondary | ICD-10-CM | POA: Diagnosis not present

## 2021-09-17 DIAGNOSIS — J3081 Allergic rhinitis due to animal (cat) (dog) hair and dander: Secondary | ICD-10-CM | POA: Diagnosis not present

## 2021-09-17 DIAGNOSIS — J3089 Other allergic rhinitis: Secondary | ICD-10-CM | POA: Diagnosis not present

## 2021-10-04 DIAGNOSIS — H35033 Hypertensive retinopathy, bilateral: Secondary | ICD-10-CM | POA: Diagnosis not present

## 2021-10-04 DIAGNOSIS — H04123 Dry eye syndrome of bilateral lacrimal glands: Secondary | ICD-10-CM | POA: Diagnosis not present

## 2021-10-04 DIAGNOSIS — H11423 Conjunctival edema, bilateral: Secondary | ICD-10-CM | POA: Diagnosis not present

## 2021-10-04 DIAGNOSIS — H40023 Open angle with borderline findings, high risk, bilateral: Secondary | ICD-10-CM | POA: Diagnosis not present

## 2021-10-04 DIAGNOSIS — H353132 Nonexudative age-related macular degeneration, bilateral, intermediate dry stage: Secondary | ICD-10-CM | POA: Diagnosis not present

## 2021-10-04 DIAGNOSIS — H35373 Puckering of macula, bilateral: Secondary | ICD-10-CM | POA: Diagnosis not present

## 2021-10-08 DIAGNOSIS — J301 Allergic rhinitis due to pollen: Secondary | ICD-10-CM | POA: Diagnosis not present

## 2021-10-08 DIAGNOSIS — J3081 Allergic rhinitis due to animal (cat) (dog) hair and dander: Secondary | ICD-10-CM | POA: Diagnosis not present

## 2021-10-08 DIAGNOSIS — J3089 Other allergic rhinitis: Secondary | ICD-10-CM | POA: Diagnosis not present

## 2021-11-02 LAB — HEMOGLOBIN A1C: Hemoglobin A1C: 5.6

## 2021-12-14 ENCOUNTER — Ambulatory Visit: Payer: Medicare Other | Admitting: Podiatry

## 2021-12-14 ENCOUNTER — Encounter: Payer: Self-pay | Admitting: Podiatry

## 2021-12-14 DIAGNOSIS — I872 Venous insufficiency (chronic) (peripheral): Secondary | ICD-10-CM

## 2021-12-14 DIAGNOSIS — B351 Tinea unguium: Secondary | ICD-10-CM | POA: Diagnosis not present

## 2021-12-14 DIAGNOSIS — M79675 Pain in left toe(s): Secondary | ICD-10-CM

## 2021-12-14 DIAGNOSIS — M79674 Pain in right toe(s): Secondary | ICD-10-CM | POA: Diagnosis not present

## 2021-12-14 NOTE — Progress Notes (Signed)
This patient returns to my office for at risk foot care.  This patient requires this care by a professional since this patient will be at risk due to having venous insufficiency  This patient is unable to cut nails herself since the patient cannot reach her nails.These nails are painful walking and wearing shoes.  This patient presents for at risk foot care today.  General Appearance  Alert, conversant and in no acute stress.  Vascular  Dorsalis pedis and posterior tibial  pulses are palpable  bilaterally.  Capillary return is within normal limits  bilaterally. Temperature is within normal limits  bilaterally.  Neurologic  Senn-Weinstein monofilament wire test within normal limits  bilaterally. Muscle power within normal limits bilaterally.  Nails Thick disfigured discolored nails with subungual debris  from hallux to fifth toes bilaterally. No evidence of bacterial infection or drainage bilaterally.  Orthopedic  No limitations of motion  feet .  No crepitus or effusions noted.  HAV with hammer toes  B/L.  Skin  normotropic skin with no porokeratosis noted bilaterally.  No signs of infections or ulcers noted.     Onychomycosis  Pain in right toes  Pain in left toes  Consent was obtained for treatment procedures.   Mechanical debridement of nails 1-5  bilaterally performed with a nail nipper.  Filed with dremel without incident. No infection or ulcer.     Return office visit    12 weeks                Told patient to return for periodic foot care and evaluation due to potential at risk complications.   Shivansh Hardaway DPM  

## 2021-12-24 ENCOUNTER — Other Ambulatory Visit: Payer: Self-pay | Admitting: Nurse Practitioner

## 2021-12-27 ENCOUNTER — Ambulatory Visit (INDEPENDENT_AMBULATORY_CARE_PROVIDER_SITE_OTHER): Payer: Medicare Other | Admitting: Nurse Practitioner

## 2021-12-27 ENCOUNTER — Encounter: Payer: Self-pay | Admitting: Nurse Practitioner

## 2021-12-27 VITALS — BP 140/83 | HR 84 | Temp 97.1°F | Ht 64.0 in

## 2021-12-27 DIAGNOSIS — R053 Chronic cough: Secondary | ICD-10-CM | POA: Diagnosis not present

## 2021-12-27 DIAGNOSIS — E785 Hyperlipidemia, unspecified: Secondary | ICD-10-CM

## 2021-12-27 DIAGNOSIS — M8580 Other specified disorders of bone density and structure, unspecified site: Secondary | ICD-10-CM

## 2021-12-27 DIAGNOSIS — I10 Essential (primary) hypertension: Secondary | ICD-10-CM | POA: Diagnosis not present

## 2021-12-27 DIAGNOSIS — J4520 Mild intermittent asthma, uncomplicated: Secondary | ICD-10-CM

## 2021-12-27 DIAGNOSIS — R600 Localized edema: Secondary | ICD-10-CM

## 2021-12-27 DIAGNOSIS — K219 Gastro-esophageal reflux disease without esophagitis: Secondary | ICD-10-CM

## 2021-12-27 DIAGNOSIS — N3281 Overactive bladder: Secondary | ICD-10-CM

## 2021-12-27 MED ORDER — HYDROCOD POLI-CHLORPHE POLI ER 10-8 MG/5ML PO SUER: 5.0000 mL | Freq: Two times a day (BID) | ORAL | 0 refills | Status: DC | PRN

## 2021-12-27 MED ORDER — LOSARTAN POTASSIUM 100 MG PO TABS: 100.0000 mg | ORAL_TABLET | Freq: Every day | ORAL | 1 refills | Status: DC

## 2021-12-27 NOTE — Progress Notes (Signed)
? ? ?Careteam: ?Patient Care Team: ?Lauree Chandler, NP as PCP - General (Geriatric Medicine) ?Monna Fam, MD as Consulting Physician (Ophthalmology) ? ?PLACE OF SERVICE:  ?[facility]  ?Advanced Directive information ?Does Patient Have a Medical Advance Directive?: Yes, Type of Advance Directive: Out of facility DNR (pink MOST or yellow form), Pre-existing out of facility DNR order (yellow form or pink MOST form): Pink MOST form placed in chart (order not valid for inpatient use), Does patient want to make changes to medical advance directive?: No - Patient declined ? ?Allergies  ?Allergen Reactions  ? Atenolol Other (See Comments)  ? Lisinopril Other (See Comments)  ? Meperidine Hcl Other (See Comments)  ? Penicillin G Sodium Other (See Comments)  ? Demerol Hives  ?  All over the body   ? Lisinopril-Hydrochlorothiazide Hives  ?  All over the body  ? Penicillins Hives  ?  Has patient had a PCN reaction causing immediate rash, facial/tongue/throat swelling, SOB or lightheadedness with hypotension: No ?Has patient had a PCN reaction causing severe rash involving mucus membranes or skin necrosis: No ?Has patient had a PCN reaction that required hospitalization: No ?Has patient had a PCN reaction occurring within the last 10 years: No ?If all of the above answers are "NO", then may proceed with Cephalosporin use. ? ? ?All over the body  ? ? ?Chief Complaint  ?Patient presents with  ? Medical Management of Chronic Issues  ?  4 month follow up  ? ? ? ?HPI: Patient is a 76 y.o. female for routine follow up.  ? ?Htn- brings in blood pressure log- in last 60 days. 139-151/77-88. This is after she takes her medication. Taking losartan 75 mg daily, procardia 60 mg daily on lasix ? ?LE edema- stable on lasix ? ?GERD- stable on protonix.  ? ?Cough/allergies- bad time of year for her, tries to stay in. Using xyzal daily and singulair daily  ?Using tussionex sparingly but still uses as needed.  ?Using symbicort twice  daily  ?  ?Constipation managed with diet  ? ?OAB stable on oxybutynin ? ? ?Review of Systems:  ?Review of Systems  ?Constitutional:  Negative for chills, fever and weight loss.  ?HENT:  Negative for tinnitus.   ?Respiratory:  Positive for cough. Negative for sputum production and shortness of breath.   ?Cardiovascular:  Negative for chest pain, palpitations and leg swelling.  ?Gastrointestinal:  Negative for abdominal pain, constipation, diarrhea and heartburn.  ?Genitourinary:  Negative for dysuria, frequency and urgency.  ?Musculoskeletal:  Negative for back pain, falls, joint pain and myalgias.  ?Skin: Negative.   ?Neurological:  Negative for dizziness and headaches.  ?Endo/Heme/Allergies:  Positive for environmental allergies.  ?Psychiatric/Behavioral:  Negative for depression and memory loss. The patient does not have insomnia.   ? ?Past Medical History:  ?Diagnosis Date  ? Abnormal Pap smear   ? Over 88yr ago  ? ALKALINE PHOSPHATASE, ELEVATED 07/01/2009  ? Qualifier: Diagnosis of  By: NLenna GilfordMD, SDeborra Medina  ? Allergic rhinitis   ? Anemia   ? Arthritis   ? Asthma   ? Cataract   ? bilateral implants  ? Cough   ? occasional; 9-21: reports as ongoing cough , managing with cough medicine and scheduled and prn inhalers, reports mucus is clear and scant    ? Cystocele 11/05/08  ? Diverticulosis of colon   ? DJD (degenerative joint disease)   ? no per pt  ? Elevated alkaline phosphatase level   ?  GERD (gastroesophageal reflux disease)   ? H/O urinary incontinence 11/06/2008  ? H/O varicella   ? Hard measles   ? Hx of colonic polyps   ? Hypercholesterolemia   ? Hypertension   ? Leg swelling   ? occasional  ? 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 Overweight(278.02)   ? Rectocele 11/06/2008  ? Large  ? Sebaceous cyst   ? on back  ? Sinusitis   ? Venous insufficiency   ? Wheezing   ? occasional  ? Yeast infection   ? ?Past Surgical History:  ?Procedure Laterality Date  ? ABDOMINAL HYSTERECTOMY  1988  ? ANTERIOR  AND POSTERIOR REPAIR  11/05/08  ? ANTERIOR AND POSTERIOR REPAIR N/A 05/15/2019  ? Procedure: ANTERIOR (CYSTOCELE) AND POSTERIOR REPAIR (RECTOCELE);  Surgeon: Everett Graff, MD;  Location[facility];  Service: Gynecology;  Laterality: N/A;  ? APOGEE / [place]  10/2008  ? Dr. Octavio Manns  ? BLADDER SUSPENSION N/A 05/15/2019  ? Procedure: TRANSVAGINAL TAPE (TVT) PROCEDURE;  Surgeon: Everett Graff, MD;  [facility]: [facility];  Service: Gynecology;  Laterality: N/A;  ? bladder tack  2011  ? BREAST SURGERY  1978  ? cyst removed from both removed  ? cataracts   approx 10 years  ? Dr Johnnye Sima , ctaract with lens placement   ? COLONOSCOPY    ? CYSTOSCOPY N/A 05/15/2019  ? Procedure: CYSTOSCOPY;  Surgeon: Everett Graff, MD;  Location[facility];  Service: Gynecology;  Laterality: N/A;  ? ESOPHAGOGASTRODUODENOSCOPY    ? ?Social History: ?  reports that she has never smoked. She has never used smokeless tobacco. She reports current alcohol use of about 2.0 standard drinks per week. She reports that she does not use drugs. ? ?Family History  ?Problem Relation Age of Onset  ? Aneurysm Mother   ? Lung cancer Father   ? Hypertension Daughter   ? Hypertension Daughter   ? Colon cancer Neg Hx   ? Esophageal cancer Neg Hx   ? Stomach cancer Neg Hx   ? Rectal cancer Neg Hx   ? Colon polyps Neg Hx   ? ? ?Medications: ?Patient's Medications  ?New Prescriptions  ? No medications on file  ?Previous Medications  ? ALBUTEROL (PROVENTIL HFA;VENTOLIN HFA) 108 (90 BASE) MCG/ACT INHALER    Inhale 2 puffs into the lungs every 6 (six) hours as needed for wheezing or shortness of breath.  ? BUDESONIDE-FORMOTEROL (SYMBICORT) 160-4.5 MCG/ACT INHALER    Inhale 2 puffs into the lungs 2 (two) times daily.  ? CALCIUM CARB-CHOLECALCIFEROL (CALCIUM 600 + D PO)    Take 1 tablet by mouth 2 (two) times daily.   ? CETIRIZINE (ZYRTEC) 10 MG TABLET    Take 10 mg by mouth daily.  ?  CHLORPHENIRAMINE-HYDROCODONE (TUSSIONEX PENNKINETIC ER) 10-8 MG/5ML SUER    Take 5 mLs by mouth every 12 (twelve) hours as needed for cough.  ? DULOXETINE (CYMBALTA) 60 MG CAPSULE    TAKE 1 CAPSULE (60 MG TOTAL) BY MOUTH DAILY. DX (F43.22)  ? EPINEPHRINE 0.3 MG/0.3 ML IJ SOAJ INJECTION    epinephrine 0.3 mg/0.3 mL injection, auto-injector  ? FLUTICASONE (FLONASE) 50 MCG/ACT NASAL SPRAY    2 sprays daily.  ? FUROSEMIDE (LASIX) 40 MG TABLET    TAKE 1 TABLET BY MOUTH EVERY DAY  ? LEVOCETIRIZINE (XYZAL) 5 MG TABLET    Take 1 tablet (5 mg total) by mouth every evening.  ? LOSARTAN (COZAAR) 25 MG TABLET  TAKE 3 TABLETS BY MOUTH EVERY DAY  ? MONTELUKAST (SINGULAIR) 10 MG TABLET    Take 10 mg by mouth at bedtime.  ? MULTIPLE VITAMINS-MINERALS (MACULAR VITAMIN BENEFIT PO)    Take 1 capsule by mouth 2 (two) times daily.  ? NEOMYCIN-POLYMYXIN B-DEXAMETHASONE (MAXITROL) 3.5-10000-0.1 OINT    Place 3.5 application into the left eye as needed.  ? NIFEDIPINE (PROCARDIA XL/NIFEDICAL XL) 60 MG 24 HR TABLET    TAKE 1 TABLET BY MOUTH EVERY DAY  ? NON FORMULARY    Allergy shots once every other week  ? OXYBUTYNIN (DITROPAN) 5 MG TABLET    TAKE 1 TABLET BY MOUTH TWICE A DAY  ? PANTOPRAZOLE (PROTONIX) 40 MG TABLET    TAKE 1 TABLET (40 MG TOTAL) BY MOUTH 2 (TWO) TIMES DAILY. FOR STOMACH  ? POTASSIUM CHLORIDE SA (KLOR-CON M20) 20 MEQ TABLET    TAKE TWO TABLETS BY MOUTH ONCE DAILY FOR POTASSIUM SUPPLEMENT  ? PRAVASTATIN (PRAVACHOL) 40 MG TABLET    TAKE 1 TABLET BY MOUTH EVERY DAY  ? SPIRIVA HANDIHALER 18 MCG INHALATION CAPSULE    Place 18 mcg into inhaler and inhale daily.   ? SYSTANE ULTRA 0.4-0.3 % SOLN    SMARTSIG:1 Drop(s) In Eye(s) As Needed  ? VITAMIN B-12 (CYANOCOBALAMIN) 1000 MCG TABLET    Take 1,000 mcg by mouth daily.  ? WHEAT DEXTRIN (BENEFIBER PO)    Take by mouth as needed.  ?Modified Medications  ? No medications on file  ?Discontinued Medications  ? No medications on file  ? ? ?Physical Exam: ? ?Vitals:  ? 12/27/21 1023   ?BP: 140/83  ?Pulse: 84  ?Temp: (!) 97.1 ?F (36.2 ?C)  ?TempSrc: Temporal  ?SpO2: 98%  ?Height: '5\' 4"'$  (1.626 m)  ? ?Body mass index is 37.59 kg/m?. ?Wt Readings from Last 3 Encounters:  ?08/27/21 219 lb (99.3 kg)  ?02/19/21 215 lb (

## 2021-12-27 NOTE — Patient Instructions (Signed)
Increase losartan to 100mg daily

## 2022-01-10 ENCOUNTER — Ambulatory Visit (INDEPENDENT_AMBULATORY_CARE_PROVIDER_SITE_OTHER): Payer: Medicare Other | Admitting: Nurse Practitioner

## 2022-01-10 ENCOUNTER — Encounter: Payer: Self-pay | Admitting: Nurse Practitioner

## 2022-01-10 VITALS — BP 136/78 | HR 82 | Temp 97.5°F | Ht 64.0 in | Wt 221.0 lb

## 2022-01-10 DIAGNOSIS — I10 Essential (primary) hypertension: Secondary | ICD-10-CM

## 2022-01-10 LAB — COMPLETE METABOLIC PANEL WITH GFR
AG Ratio: 1.2 (calc) (ref 1.0–2.5)
ALT: 13 U/L (ref 6–29)
AST: 19 U/L (ref 10–35)
Albumin: 4 g/dL (ref 3.6–5.1)
Alkaline phosphatase (APISO): 148 U/L (ref 37–153)
BUN: 16 mg/dL (ref 7–25)
CO2: 26 mmol/L (ref 20–32)
Calcium: 9.6 mg/dL (ref 8.6–10.4)
Chloride: 108 mmol/L (ref 98–110)
Creat: 0.81 mg/dL (ref 0.60–1.00)
Globulin: 3.3 g/dL (calc) (ref 1.9–3.7)
Glucose, Bld: 66 mg/dL (ref 65–139)
Potassium: 4.1 mmol/L (ref 3.5–5.3)
Sodium: 141 mmol/L (ref 135–146)
Total Bilirubin: 0.4 mg/dL (ref 0.2–1.2)
Total Protein: 7.3 g/dL (ref 6.1–8.1)
eGFR: 74 mL/min/{1.73_m2} (ref >=60)

## 2022-01-10 LAB — CBC WITH DIFFERENTIAL/PLATELET
Absolute Monocytes: 486 cells/uL (ref 200–950)
Basophils Absolute: 42 cells/uL (ref 0–200)
Basophils Relative: 0.7 %
Eosinophils Absolute: 222 cells/uL (ref 15–500)
Eosinophils Relative: 3.7 %
HCT: 34.6 % — ABNORMAL LOW (ref 35.0–45.0)
Hemoglobin: 11.2 g/dL — ABNORMAL LOW (ref 11.7–15.5)
Lymphs Abs: 2442 cells/uL (ref 850–3900)
MCH: 27.1 pg (ref 27.0–33.0)
MCHC: 32.4 g/dL (ref 32.0–36.0)
MCV: 83.6 fL (ref 80.0–100.0)
MPV: 11 fL (ref 7.5–12.5)
Monocytes Relative: 8.1 %
Neutro Abs: 2808 cells/uL (ref 1500–7800)
Neutrophils Relative %: 46.8 %
Platelets: 243 10*3/uL (ref 140–400)
RBC: 4.14 10*6/uL (ref 3.80–5.10)
RDW: 13.9 % (ref 11.0–15.0)
Total Lymphocyte: 40.7 %
WBC: 6 10*3/uL (ref 3.8–10.8)

## 2022-01-10 NOTE — Progress Notes (Signed)
Careteam: Patient Care Team: Lauree Chandler, NP as PCP - General (Geriatric Medicine) Monna Fam, MD as Consulting Physician (Ophthalmology)  PLACE OF SERVICE:  [place] Directive information Does Patient Have a Medical Advance Directive?: Yes, Type of Advance Directive: Out of facility DNR (pink MOST or yellow form), Pre-existing out of facility DNR order (yellow form or pink MOST form): Pink MOST form placed in chart (order not valid for inpatient use), Does patient want to make changes to medical advance directive?: No - Patient declined  Allergies  Allergen Reactions   Atenolol Other (See Comments)   Lisinopril Other (See Comments)   Meperidine Hcl Other (See Comments)   Penicillin G Sodium Other (See Comments)   Demerol Hives    All over the body    Lisinopril-Hydrochlorothiazide Hives    All over the body   Penicillins Hives    Has patient had a PCN reaction causing immediate rash, facial/tongue/throat swelling, SOB or lightheadedness with hypotension: No Has patient had a PCN reaction causing severe rash involving mucus membranes or skin necrosis: No Has patient had a PCN reaction that required hospitalization: No Has patient had a PCN reaction occurring within the last 10 years: No If all of the above answers are "NO", then may proceed with Cephalosporin use.   All over the body    Chief Complaint  Patient presents with   Follow-up    2 week b/p follow-up      HPI: Patient is a 76 y.o. female for follow up on blood pressure  At last visit increase losartan to 100 mg daily. 126/70 at home when she checked.  No side effects noted from medication  No headaches, dizziness.    Review of Systems:  Review of Systems  Constitutional:  Negative for chills, fever and weight loss.  HENT:  Negative for tinnitus.   Respiratory:  Negative for cough, sputum production and shortness of breath.   Cardiovascular:  Negative for chest pain,  palpitations and leg swelling.  Gastrointestinal:  Negative for abdominal pain, constipation, diarrhea and heartburn.  Genitourinary:  Negative for dysuria, frequency and urgency.  Musculoskeletal:  Negative for back pain, falls, joint pain and myalgias.  Skin: Negative.   Neurological:  Negative for dizziness and headaches.  Psychiatric/Behavioral:  Negative for depression and memory loss. The patient does not have insomnia.    Past Medical History:  Diagnosis Date   Abnormal Pap smear    Over 67yr ago   ALKALINE PHOSPHATASE, ELEVATED 07/01/2009   Qualifier: Diagnosis of  By: NLenna GilfordMD, SDeborra Medina   Allergic rhinitis    Anemia    Arthritis    Asthma    Cataract    bilateral implants   Cough    occasional; 9-21: reports as ongoing cough , managing with cough medicine and scheduled and prn inhalers, reports mucus is clear and scant     Cystocele 11/05/08   Diverticulosis of colon    DJD (degenerative joint disease)    no per pt   Elevated alkaline phosphatase level    GERD (gastroesophageal reflux disease)    H/O urinary incontinence 11/06/2008   H/O varicella    Hard measles    Hx of colonic polyps    Hypercholesterolemia    Hypertension    Leg swelling    occasional   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 Octavio Manns   BLADDER SUSPENSION N/A 05/15/2019   Procedure: TRANSVAGINAL TAPE (TVT) PROCEDURE;  Surgeon:  Everett Graff, MD;  Location: [facility];  Service: Gynecology;  Laterality: N/A;   bladder tack  2011   BREAST SURGERY  1978   cyst removed from both removed   cataracts   approx 10 years   Dr Johnnye Sima , ctaract with lens placement    COLONOSCOPY     CYSTOSCOPY N/A 05/15/2019   Procedure: CYSTOSCOPY;  Surgeon: Everett Graff, MD;  Location: [facility];  Service: Gynecology;  Laterality: N/A;   ESOPHAGOGASTRODUODENOSCOPY     Social History:   reports that she has never smoked. She has never used smokeless tobacco. She reports current alcohol use of about 2.0 standard drinks per week. She reports that she does not use drugs.  Family History  Problem Relation Age of Onset   Aneurysm Mother    Lung cancer Father    Hypertension Daughter    Hypertension Daughter    Colon cancer Neg Hx    Esophageal cancer Neg Hx    Stomach cancer Neg Hx    Rectal cancer Neg Hx    Colon polyps Neg Hx     Medications: Patient's Medications  New Prescriptions   No medications on file  Previous Medications   ALBUTEROL (PROVENTIL HFA;VENTOLIN HFA) 108 (90 BASE) MCG/ACT INHALER    Inhale 2 puffs into the lungs every 6 (six) hours as needed for wheezing or shortness of breath.   BUDESONIDE-FORMOTEROL (SYMBICORT) 160-4.5 MCG/ACT INHALER    Inhale 2 puffs into the lungs 2 (two) times daily.   CALCIUM CARB-CHOLECALCIFEROL (CALCIUM 600 + D PO)    Take 1 tablet by mouth 2 (two) times daily.    CETIRIZINE (ZYRTEC) 10 MG TABLET    Take 10 mg by mouth daily.   CHLORPHENIRAMINE-HYDROCODONE (TUSSIONEX PENNKINETIC ER) 10-8 MG/5ML    Take 5 mLs by mouth every 12 (twelve) hours as needed for cough.   DULOXETINE (CYMBALTA) 60 MG CAPSULE    TAKE 1 CAPSULE (60 MG TOTAL) BY MOUTH DAILY. DX (F43.22)   EPINEPHRINE 0.3 MG/0.3 ML IJ SOAJ INJECTION    epinephrine 0.3 mg/0.3 mL injection, auto-injector   FLUTICASONE (FLONASE) 50 MCG/ACT NASAL SPRAY    2 sprays daily.   FUROSEMIDE (LASIX) 40 MG  TABLET    TAKE 1 TABLET BY MOUTH EVERY DAY   LEVOCETIRIZINE (XYZAL) 5 MG TABLET    Take 1 tablet (5 mg total) by mouth every evening.   LOSARTAN (COZAAR) 100 MG TABLET    Take 1 tablet (100 mg total) by mouth daily.   MONTELUKAST (SINGULAIR) 10 MG TABLET    Take 10 mg by mouth at bedtime.   MULTIPLE VITAMINS-MINERALS (MACULAR VITAMIN BENEFIT PO)    Take 1 capsule by mouth 2 (two) times daily.   NEOMYCIN-POLYMYXIN B-DEXAMETHASONE (MAXITROL) 3.5-10000-0.1 OINT    Place 3.5 application into the left eye as needed.   NIFEDIPINE (  PROCARDIA XL/NIFEDICAL XL) 60 MG 24 HR TABLET    TAKE 1 TABLET BY MOUTH EVERY DAY   NON FORMULARY    Allergy shots once every other week   OXYBUTYNIN (DITROPAN) 5 MG TABLET    TAKE 1 TABLET BY MOUTH TWICE A DAY   PANTOPRAZOLE (PROTONIX) 40 MG TABLET    TAKE 1 TABLET (40 MG TOTAL) BY MOUTH 2 (TWO) TIMES DAILY. FOR STOMACH   POTASSIUM CHLORIDE SA (KLOR-CON M20) 20 MEQ TABLET    TAKE TWO TABLETS BY MOUTH ONCE DAILY FOR POTASSIUM SUPPLEMENT   PRAVASTATIN (PRAVACHOL) 40 MG TABLET    TAKE 1 TABLET BY MOUTH EVERY DAY   SPIRIVA HANDIHALER 18 MCG INHALATION CAPSULE    Place 18 mcg into inhaler and inhale daily.    SYSTANE ULTRA 0.4-0.3 % SOLN    SMARTSIG:1 Drop(s) In Eye(s) As Needed   VITAMIN B-12 (CYANOCOBALAMIN) 1000 MCG TABLET    Take 1,000 mcg by mouth daily.   WHEAT DEXTRIN (BENEFIBER PO)    Take by mouth as needed.  Modified Medications   No medications on file  Discontinued Medications   No medications on file    Physical Exam:  Vitals:   01/10/22 0925  BP: 136/78  Pulse: 82  Temp: (!) 97.5 F (36.4 C)  TempSrc: Temporal  SpO2: 98%  Weight: 221 lb (100.2 kg)  Height: _0  (1.626 m)   Body mass index is 37.93 kg/m. Wt Readings from Last 3 Encounters:  01/10/22 221 lb (100.2 kg)  08/27/21 219 lb (99.3 kg)  02/19/21 215 lb (97.5 kg)    Physical Exam Constitutional:      General: She is not in acute distress.    Appearance: She is well-developed. She is  not diaphoretic.  HENT:     Head: Normocephalic and atraumatic.  Eyes:     Conjunctiva/sclera: Conjunctivae normal.     Pupils: Pupils are equal, round, and reactive to light.  Cardiovascular:     Rate and Rhythm: Normal rate and regular rhythm.     Heart sounds: Normal heart sounds.  Pulmonary:     Effort: Pulmonary effort is normal.     Breath sounds: Normal breath sounds.  Musculoskeletal:     Cervical back: Normal range of motion and neck supple.     Right lower leg: No edema.     Left lower leg: No edema.  Skin:    General: Skin is warm and dry.  Neurological:     Mental Status: She is alert.  Psychiatric:        Mood and Affect: Mood normal.    Labs reviewed: Basic Metabolic Panel: Recent Labs    02/19/21 0000 08/24/21 0819  NA 139 142  K 4.7 4.7  CL 109 107  CO2 20 29  GLUCOSE 83 105*  BUN 15 13  CREATININE 0.93 0.90  CALCIUM 9.6 9.9   Liver Function Tests: Recent Labs    02/19/21 0000 08/24/21 0819  AST 21 21  ALT 13 16  BILITOT 0.5 0.5  PROT 7.2 7.6   No results for input(s): LIPASE, AMYLASE in the last 8760 hours. No results for input(s): AMMONIA in the last 8760 hours. CBC: Recent Labs    02/19/21 0000 08/24/21 0819  WBC 5.7 5.8  NEUTROABS 2,400 2,390  HGB 11.5* 11.7  HCT 36.3 37.1  MCV 82.9 83.2  PLT 266 277   Lipid Panel: Recent Labs    08/24/21 0819  CHOL 177  HDL 63  LDLCALC 99  TRIG 61  CHOLHDL 2.8   TSH: No results for input(s): TSH in the last 8760 hours. A1C: Lab Results  Component Value Date   HGBA1C 5.6 08/12/2020     Assessment/Plan 1. Essential hypertension -Blood pressure well controlled with increase in losartan Continue current medications Recheck metabolic panel - CBC with Differential/Platelet - CMP with eGFR([facility]) -continue dietary modifications.  Return in about 6 months (around 07/13/2022) for routine follow up- blood work with visit. Wendy Figueroa. [place], [facility] [phone]

## 2022-03-01 ENCOUNTER — Other Ambulatory Visit: Payer: Self-pay | Admitting: Nurse Practitioner

## 2022-03-01 DIAGNOSIS — F4322 Adjustment disorder with anxiety: Secondary | ICD-10-CM

## 2022-03-21 ENCOUNTER — Ambulatory Visit: Payer: Medicare Other | Admitting: Podiatry

## 2022-03-21 ENCOUNTER — Encounter: Payer: Self-pay | Admitting: Podiatry

## 2022-03-21 DIAGNOSIS — M79675 Pain in left toe(s): Secondary | ICD-10-CM

## 2022-03-21 DIAGNOSIS — I872 Venous insufficiency (chronic) (peripheral): Secondary | ICD-10-CM | POA: Diagnosis not present

## 2022-03-21 DIAGNOSIS — M79674 Pain in right toe(s): Secondary | ICD-10-CM | POA: Diagnosis not present

## 2022-03-21 DIAGNOSIS — B351 Tinea unguium: Secondary | ICD-10-CM | POA: Diagnosis not present

## 2022-03-21 NOTE — Progress Notes (Signed)
This patient returns to my office for at risk foot care.  This patient requires this care by a professional since this patient will be at risk due to having venous insufficiency  This patient is unable to cut nails herself since the patient cannot reach her nails.These nails are painful walking and wearing shoes.  This patient presents for at risk foot care today.  General Appearance  Alert, conversant and in no acute stress.  Vascular  Dorsalis pedis and posterior tibial  pulses are palpable  bilaterally.  Capillary return is within normal limits  bilaterally. Temperature is within normal limits  bilaterally.  Neurologic  Senn-Weinstein monofilament wire test within normal limits  bilaterally. Muscle power within normal limits bilaterally.  Nails Thick disfigured discolored nails with subungual debris  from hallux to fifth toes bilaterally. No evidence of bacterial infection or drainage bilaterally.  Orthopedic  No limitations of motion  feet .  No crepitus or effusions noted.  HAV with hammer toes  B/L.  Skin  normotropic skin with no porokeratosis noted bilaterally.  No signs of infections or ulcers noted.     Onychomycosis  Pain in right toes  Pain in left toes  Consent was obtained for treatment procedures.   Mechanical debridement of nails 1-5  bilaterally performed with a nail nipper.  Filed with dremel without incident. No infection or ulcer.     Return office visit    12 weeks                Told patient to return for periodic foot care and evaluation due to potential at risk complications.   Rhian Funari DPM  

## 2022-05-12 ENCOUNTER — Other Ambulatory Visit: Payer: Self-pay | Admitting: Nurse Practitioner

## 2022-06-03 ENCOUNTER — Other Ambulatory Visit: Payer: Self-pay

## 2022-06-03 DIAGNOSIS — R053 Chronic cough: Secondary | ICD-10-CM

## 2022-06-03 MED ORDER — HYDROCOD POLI-CHLORPHE POLI ER 10-8 MG/5ML PO SUER: 5.0000 mL | Freq: Two times a day (BID) | ORAL | 0 refills | Status: DC | PRN

## 2022-06-03 NOTE — Telephone Encounter (Signed)
Patient called requesting a refill for Tussionex.

## 2022-06-21 ENCOUNTER — Encounter: Payer: Self-pay | Admitting: Podiatry

## 2022-06-21 ENCOUNTER — Ambulatory Visit: Payer: Medicare Other | Admitting: Podiatry

## 2022-06-21 DIAGNOSIS — I872 Venous insufficiency (chronic) (peripheral): Secondary | ICD-10-CM | POA: Diagnosis not present

## 2022-06-21 DIAGNOSIS — M79674 Pain in right toe(s): Secondary | ICD-10-CM | POA: Diagnosis not present

## 2022-06-21 DIAGNOSIS — B351 Tinea unguium: Secondary | ICD-10-CM

## 2022-06-21 DIAGNOSIS — M79675 Pain in left toe(s): Secondary | ICD-10-CM | POA: Diagnosis not present

## 2022-06-21 NOTE — Progress Notes (Signed)
This patient returns to my office for at risk foot care.  This patient requires this care by a professional since this patient will be at risk due to having venous insufficiency  This patient is unable to cut nails herself since the patient cannot reach her nails.These nails are painful walking and wearing shoes.  This patient presents for at risk foot care today.  General Appearance  Alert, conversant and in no acute stress.  Vascular  Dorsalis pedis and posterior tibial  pulses are palpable  bilaterally.  Capillary return is within normal limits  bilaterally. Temperature is within normal limits  bilaterally.  Neurologic  Senn-Weinstein monofilament wire test within normal limits  bilaterally. Muscle power within normal limits bilaterally.  Nails Thick disfigured discolored nails with subungual debris  from hallux to fifth toes bilaterally. No evidence of bacterial infection or drainage bilaterally.  Orthopedic  No limitations of motion  feet .  No crepitus or effusions noted.  HAV with hammer toes  B/L.  Skin  normotropic skin with no porokeratosis noted bilaterally.  No signs of infections or ulcers noted.     Onychomycosis  Pain in right toes  Pain in left toes  Consent was obtained for treatment procedures.   Mechanical debridement of nails 1-5  bilaterally performed with a nail nipper.  Filed with dremel without incident. No infection or ulcer.     Return office visit    12 weeks                Told patient to return for periodic foot care and evaluation due to potential at risk complications.   Gardiner Barefoot DPM

## 2022-07-07 LAB — HM MAMMOGRAPHY

## 2022-07-22 ENCOUNTER — Encounter: Payer: Self-pay | Admitting: Nurse Practitioner

## 2022-07-22 ENCOUNTER — Ambulatory Visit (INDEPENDENT_AMBULATORY_CARE_PROVIDER_SITE_OTHER): Payer: Medicare Other | Admitting: Nurse Practitioner

## 2022-07-22 VITALS — BP 136/80 | HR 93 | Temp 96.6°F | Ht 64.0 in | Wt 224.6 lb

## 2022-07-22 DIAGNOSIS — J849 Interstitial pulmonary disease, unspecified: Secondary | ICD-10-CM

## 2022-07-22 DIAGNOSIS — N3281 Overactive bladder: Secondary | ICD-10-CM

## 2022-07-22 DIAGNOSIS — K219 Gastro-esophageal reflux disease without esophagitis: Secondary | ICD-10-CM

## 2022-07-22 DIAGNOSIS — R600 Localized edema: Secondary | ICD-10-CM

## 2022-07-22 DIAGNOSIS — R053 Chronic cough: Secondary | ICD-10-CM | POA: Diagnosis not present

## 2022-07-22 DIAGNOSIS — I10 Essential (primary) hypertension: Secondary | ICD-10-CM | POA: Diagnosis not present

## 2022-07-22 DIAGNOSIS — E785 Hyperlipidemia, unspecified: Secondary | ICD-10-CM

## 2022-07-22 NOTE — Progress Notes (Signed)
Careteam: Patient Care Team: Lauree Chandler, NP as PCP - General (Geriatric Medicine) Monna Fam, MD as Consulting Physician (Ophthalmology)  PLACE OF SERVICE:  [place]  Advanced Directive information    Allergies  Allergen Reactions   Atenolol Other (See Comments)   Lisinopril Other (See Comments)   Meperidine Hcl Other (See Comments)   Penicillin G Sodium Other (See Comments)   Demerol Hives    All over the body    Lisinopril-Hydrochlorothiazide Hives    All over the body   Penicillins Hives    Has patient had a PCN reaction causing immediate rash, facial/tongue/throat swelling, SOB or lightheadedness with hypotension: No Has patient had a PCN reaction causing severe rash involving mucus membranes or skin necrosis: No Has patient had a PCN reaction that required hospitalization: No Has patient had a PCN reaction occurring within the last 10 years: No If all of the above answers are "NO", then may proceed with Cephalosporin use.   All over the body    Chief Complaint  Patient presents with   Medical Management of Chronic Issues     HPI: Patient is a 76 y.o. female for routine follow up.   She has already had flu and covid booster.   Mood has been good, sleeping well.   GERD- controlled on Protonix.   She is trying to walk more; 3-4 days a week.   Chronic cough- ongoing  Asthma- well controlled on current medication- no recent flares.   Oab- controlled on oxybutynin- has dry eyes but not too bad.    Review of Systems:  Review of Systems  Constitutional:  Negative for chills, fever and weight loss.  HENT:  Negative for tinnitus.   Respiratory:  Positive for cough. Negative for sputum production and shortness of breath.   Cardiovascular:  Negative for chest pain, palpitations and leg swelling.  Gastrointestinal:  Negative for abdominal pain, constipation, diarrhea and heartburn.  Genitourinary:  Negative for dysuria, frequency and urgency.   Musculoskeletal:  Negative for back pain, falls, joint pain and myalgias.  Skin: Negative.   Neurological:  Negative for dizziness and headaches.  Psychiatric/Behavioral:  Negative for depression and memory loss. The patient does not have insomnia.     Past Medical History:  Diagnosis Date   Abnormal Pap smear    Over 34yr ago   ALKALINE PHOSPHATASE, ELEVATED 07/01/2009   Qualifier: Diagnosis of  By: NLenna GilfordMD, SDeborra Medina   Allergic rhinitis    Anemia    Arthritis    Asthma    Cataract    bilateral implants   Cough    occasional; 9-21: reports as ongoing cough , managing with cough medicine and scheduled and prn inhalers, reports mucus is clear and scant     Cystocele 11/05/08   Diverticulosis of colon    DJD (degenerative joint disease)    no per pt   Elevated alkaline phosphatase level    GERD (gastroesophageal reflux disease)    H/O urinary incontinence 11/06/2008   H/O varicella    Hard measles    Hx of colonic polyps    Hypercholesterolemia    Hypertension    Leg swelling    occasional   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 Octavio Manns   BLADDER SUSPENSION N/A 05/15/2019   Procedure: TRANSVAGINAL TAPE (TVT) PROCEDURE;  Surgeon: Everett Graff, MD;  Location: [facility];  Service: Gynecology;  Laterality: N/A;   bladder tack  2011   BREAST SURGERY  1978   cyst removed from both removed    cataracts   approx 10 years   Dr Johnnye Sima , ctaract with lens placement    COLONOSCOPY     CYSTOSCOPY N/A 05/15/2019   Procedure: CYSTOSCOPY;  Surgeon: Everett Graff, MD;  [facility];  Service: Gynecology;  Laterality: N/A;   ESOPHAGOGASTRODUODENOSCOPY     Social History:   reports that she has never smoked. She has never used smokeless tobacco. She reports current alcohol use of about 2.0 standard drinks of alcohol per week. She reports that she does not use drugs.  Family History  Problem Relation Age of Onset   Aneurysm Mother    Lung cancer Father    Hypertension Daughter    Hypertension Daughter    Colon cancer Neg Hx    Esophageal cancer Neg Hx    Stomach cancer Neg Hx    Rectal cancer Neg Hx    Colon polyps Neg Hx     Medications: Patient's Medications  New Prescriptions   No medications on file  Previous Medications   ALBUTEROL (PROVENTIL HFA;VENTOLIN HFA) 108 (90 BASE) MCG/ACT INHALER    Inhale 2 puffs into the lungs every 6 (six) hours as needed for wheezing or shortness of breath.   BUDESONIDE-FORMOTEROL (SYMBICORT) 160-4.5 MCG/ACT INHALER    Inhale 2 puffs into the lungs 2 (two) times daily.   CALCIUM CARB-CHOLECALCIFEROL (CALCIUM 600 + D PO)    Take 1 tablet by mouth 2 (two) times daily.    CETIRIZINE (ZYRTEC) 10 MG TABLET    Take 10 mg by mouth daily.   CHLORPHENIRAMINE-HYDROCODONE (TUSSIONEX) 10-8 MG/5ML    Take 5 mLs by mouth every 12 (twelve) hours as needed for cough.   DULOXETINE (CYMBALTA) 60 MG CAPSULE    TAKE 1 CAPSULE (60 MG TOTAL) BY MOUTH DAILY. DX (F43.22)   EPINEPHRINE 0.3 MG/0.3 ML IJ SOAJ INJECTION    epinephrine 0.3 mg/0.3 mL injection, auto-injector   FLUTICASONE (FLONASE) 50 MCG/ACT NASAL SPRAY    2 sprays daily.   FUROSEMIDE (LASIX) 40 MG TABLET    TAKE 1 TABLET BY MOUTH EVERY DAY   LEVOCETIRIZINE (XYZAL) 5 MG TABLET    Take 1 tablet (5 mg total) by mouth every evening.   LOSARTAN (COZAAR) 100 MG TABLET    Take 1 tablet  (100 mg total) by mouth daily.   MONTELUKAST (SINGULAIR) 10 MG TABLET    Take 10 mg by mouth at bedtime.   MULTIPLE VITAMINS-MINERALS (MACULAR VITAMIN BENEFIT PO)    Take 1 capsule by mouth 2 (two) times daily.   NEOMYCIN-POLYMYXIN B-DEXAMETHASONE (MAXITROL) 3.5-10000-0.1 OINT    Place 3.5 application into the left eye as needed.   NIFEDIPINE (PROCARDIA XL/NIFEDICAL XL) 60 MG 24 HR TABLET    TAKE 1 TABLET BY MOUTH EVERY DAY   NON FORMULARY  Allergy shots every 2 weeks   OXYBUTYNIN (DITROPAN) 5 MG TABLET    TAKE 1 TABLET BY MOUTH TWICE A DAY   PANTOPRAZOLE (PROTONIX) 40 MG TABLET    TAKE 1 TABLET (40 MG TOTAL) BY MOUTH 2 (TWO) TIMES DAILY. FOR STOMACH   POTASSIUM CHLORIDE SA (KLOR-CON M20) 20 MEQ TABLET    TAKE TWO TABLETS BY MOUTH ONCE DAILY FOR POTASSIUM SUPPLEMENT   PRAVASTATIN (PRAVACHOL) 40 MG TABLET    TAKE 1 TABLET BY MOUTH EVERY DAY   SPIRIVA HANDIHALER 18 MCG INHALATION CAPSULE    Place 18 mcg into inhaler and inhale daily.    SYSTANE ULTRA 0.4-0.3 % SOLN    SMARTSIG:1 Drop(s) In Eye(s) As Needed   VITAMIN B-12 (CYANOCOBALAMIN) 1000 MCG TABLET    Take 1,000 mcg by mouth daily.   WHEAT DEXTRIN (BENEFIBER PO)    Take by mouth as needed.  Modified Medications   No medications on file  Discontinued Medications   No medications on file    Physical Exam:  Vitals:   07/22/22 0957  BP: 136/80  Pulse: 93  Temp: (!) 96.6 F (35.9 C)  SpO2: 98%  Weight: 224 lb 9.6 oz (101.9 kg)  Height: '5\' 4"'$  (1.626 m)   Body mass index is 38.55 kg/m. Wt Readings from Last 3 Encounters:  07/22/22 224 lb 9.6 oz (101.9 kg)  01/10/22 221 lb (100.2 kg)  08/27/21 219 lb (99.3 kg)    Physical Exam Constitutional:      General: She is not in acute distress.    Appearance: She is well-developed. She is not diaphoretic.  HENT:     Head: Normocephalic and atraumatic.     Mouth/Throat:     Pharynx: No oropharyngeal exudate.  Eyes:     Conjunctiva/sclera: Conjunctivae normal.     Pupils: Pupils  are equal, round, and reactive to light.  Cardiovascular:     Rate and Rhythm: Normal rate and regular rhythm.     Heart sounds: Normal heart sounds.  Pulmonary:     Effort: Pulmonary effort is normal.     Breath sounds: Normal breath sounds.  Abdominal:     General: Bowel sounds are normal.     Palpations: Abdomen is soft.  Musculoskeletal:     Cervical back: Normal range of motion and neck supple.     Right lower leg: No edema.     Left lower leg: No edema.  Skin:    General: Skin is warm and dry.  Neurological:     Mental Status: She is alert.  Psychiatric:        Mood and Affect: Mood normal.    Labs reviewed: Basic Metabolic Panel: Recent Labs    08/24/21 0819 01/10/22 0940  NA 142 141  K 4.7 4.1  CL 107 108  CO2 29 26  GLUCOSE 105* 66  BUN 13 16  CREATININE 0.90 0.81  CALCIUM 9.9 9.6   Liver Function Tests: Recent Labs    08/24/21 0819 01/10/22 0940  AST 21 19  ALT 16 13  BILITOT 0.5 0.4  PROT 7.6 7.3   No results for input(s): "LIPASE", "AMYLASE" in the last 8760 hours. No results for input(s): "AMMONIA" in the last 8760 hours. CBC: Recent Labs    08/24/21 0819 01/10/22 0940  WBC 5.8 6.0  NEUTROABS 2,390 2,808  HGB 11.7 11.2*  HCT 37.1 34.6*  MCV 83.2 83.6  PLT 277 243   Lipid Panel: Recent Labs    08/24/21 0819  CHOL 177  HDL 63  LDLCALC 99  TRIG 61  CHOLHDL 2.8   TSH: No results for input(s): "TSH" in the last 8760 hours. A1C: Lab Results  Component Value Date   HGBA1C 5.6 08/12/2020     Assessment/Plan 1. Essential hypertension -Blood pressure well controlled, goal bp <140/90 Continue current medications and dietary modifications follow metabolic panel - COMPLETE METABOLIC PANEL WITH GFR - CBC with Differential/Platelet  2. Gastroesophageal reflux disease without esophagitis -controlled on Protonix  3. Chronic cough -ongoing, continues to use tussinex sparingly for cough,   4. Morbid obesity ([place]) --education  provided on healthy weight loss through increase in physical activity and proper nutrition   6. Bilateral leg edema -ongoing and stable, continues on lasix 40 mg daily,  -encouraged to elevate legs above level of heart as tolerates, low sodium diet, compression hose as tolerates (on in am, off in pm)  7. Hyperlipidemia LDL goal <130 -continues on  - Lipid panel - COMPLETE METABOLIC PANEL WITH GFR  8. Overactive bladder Stable on   9. ILD (interstitial lung disease) ([place]) -stable, continues on Spiriva, Symbicort and Singulair   Return in about 6 months (around 01/21/2023) for routine follow up . Carlos American. [place], [facility] [phone]

## 2022-07-26 ENCOUNTER — Other Ambulatory Visit: Payer: Self-pay | Admitting: Nurse Practitioner

## 2022-07-26 DIAGNOSIS — I10 Essential (primary) hypertension: Secondary | ICD-10-CM

## 2022-07-28 LAB — IRON,TIBC AND FERRITIN PANEL
%SAT: 19 % (calc) (ref 16–45)
Ferritin: 55 ng/mL (ref 16–288)
Iron: 55 ug/dL (ref 45–160)
TIBC: 296 mcg/dL (calc) (ref 250–450)

## 2022-07-28 LAB — COMPLETE METABOLIC PANEL WITH GFR
AG Ratio: 1.1 (calc) (ref 1.0–2.5)
ALT: 20 U/L (ref 6–29)
AST: 25 U/L (ref 10–35)
Albumin: 4 g/dL (ref 3.6–5.1)
Alkaline phosphatase (APISO): 158 U/L — ABNORMAL HIGH (ref 37–153)
BUN: 14 mg/dL (ref 7–25)
CO2: 24 mmol/L (ref 20–32)
Calcium: 9.5 mg/dL (ref 8.6–10.4)
Chloride: 107 mmol/L (ref 98–110)
Creat: 0.77 mg/dL (ref 0.60–1.00)
Globulin: 3.5 g/dL (calc) (ref 1.9–3.7)
Glucose, Bld: 72 mg/dL (ref 65–139)
Potassium: 4.6 mmol/L (ref 3.5–5.3)
Sodium: 140 mmol/L (ref 135–146)
Total Bilirubin: 0.4 mg/dL (ref 0.2–1.2)
Total Protein: 7.5 g/dL (ref 6.1–8.1)
eGFR: 79 mL/min/{1.73_m2} (ref >=60)

## 2022-07-28 LAB — TEST AUTHORIZATION

## 2022-07-28 LAB — CBC WITH DIFFERENTIAL/PLATELET
Absolute Monocytes: 546 cells/uL (ref 200–950)
Basophils Absolute: 68 cells/uL (ref 0–200)
Basophils Relative: 1.1 %
Eosinophils Absolute: 167 cells/uL (ref 15–500)
Eosinophils Relative: 2.7 %
HCT: 35.6 % (ref 35.0–45.0)
Hemoglobin: 11.3 g/dL — ABNORMAL LOW (ref 11.7–15.5)
Lymphs Abs: 2629 cells/uL (ref 850–3900)
MCH: 26.8 pg — ABNORMAL LOW (ref 27.0–33.0)
MCHC: 31.7 g/dL — ABNORMAL LOW (ref 32.0–36.0)
MCV: 84.6 fL (ref 80.0–100.0)
MPV: 11 fL (ref 7.5–12.5)
Monocytes Relative: 8.8 %
Neutro Abs: 2790 cells/uL (ref 1500–7800)
Neutrophils Relative %: 45 %
Platelets: 274 10*3/uL (ref 140–400)
RBC: 4.21 10*6/uL (ref 3.80–5.10)
RDW: 13.2 % (ref 11.0–15.0)
Total Lymphocyte: 42.4 %
WBC: 6.2 10*3/uL (ref 3.8–10.8)

## 2022-07-28 LAB — LIPID PANEL
Cholesterol: 161 mg/dL (ref <200)
HDL: 62 mg/dL (ref >=50)
LDL Cholesterol (Calc): 85 mg/dL (calc)
Non-HDL Cholesterol (Calc): 99 mg/dL (calc) (ref <130)
Total CHOL/HDL Ratio: 2.6 (calc) (ref <5.0)
Triglycerides: 67 mg/dL (ref <150)

## 2022-08-23 ENCOUNTER — Encounter: Payer: Medicare Other | Admitting: Nurse Practitioner

## 2022-08-23 ENCOUNTER — Encounter: Payer: Self-pay | Admitting: Nurse Practitioner

## 2022-08-23 NOTE — Progress Notes (Unsigned)
   This service is provided via telemedicine  No vital signs collected/recorded due to the encounter was a telemedicine visit.   Location of patient (ex: home, work):  Home  Patient consents to a telephone visit: Yes, see telephone visit dated 08/23/22  Location of the provider (ex: office, home):  [place], [place] Location   Name of any referring provider:  N/A  Names of all persons participating in the telemedicine service and their role in the encounter:  S.Chrae B/CMA, Sherrie Mustache, NP, and Patient   Time spent on call:  9 min with medical assistant

## 2022-08-25 ENCOUNTER — Other Ambulatory Visit: Payer: Self-pay | Admitting: Nurse Practitioner

## 2022-08-25 DIAGNOSIS — I10 Essential (primary) hypertension: Secondary | ICD-10-CM

## 2022-08-25 DIAGNOSIS — F4322 Adjustment disorder with anxiety: Secondary | ICD-10-CM

## 2022-08-25 NOTE — Progress Notes (Signed)
This encounter was created in error - please disregard.

## 2022-09-21 ENCOUNTER — Other Ambulatory Visit: Payer: Self-pay | Admitting: Nurse Practitioner

## 2022-09-26 ENCOUNTER — Ambulatory Visit (INDEPENDENT_AMBULATORY_CARE_PROVIDER_SITE_OTHER): Payer: Medicare Other | Admitting: Nurse Practitioner

## 2022-09-26 ENCOUNTER — Encounter: Payer: Self-pay | Admitting: Nurse Practitioner

## 2022-09-26 ENCOUNTER — Ambulatory Visit: Payer: Medicare Other | Admitting: Podiatry

## 2022-09-26 VITALS — BP 122/82 | HR 85 | Temp 97.5°F | Ht 64.0 in | Wt 227.0 lb

## 2022-09-26 DIAGNOSIS — M79674 Pain in right toe(s): Secondary | ICD-10-CM

## 2022-09-26 DIAGNOSIS — I872 Venous insufficiency (chronic) (peripheral): Secondary | ICD-10-CM | POA: Diagnosis not present

## 2022-09-26 DIAGNOSIS — M79675 Pain in left toe(s): Secondary | ICD-10-CM

## 2022-09-26 DIAGNOSIS — B351 Tinea unguium: Secondary | ICD-10-CM

## 2022-09-26 DIAGNOSIS — Z0000 Encounter for general adult medical examination without abnormal findings: Secondary | ICD-10-CM

## 2022-09-26 NOTE — Progress Notes (Signed)
This patient returns to my office for at risk foot care.  This patient requires this care by a professional since this patient will be at risk due to having venous insufficiency  This patient is unable to cut nails herself since the patient cannot reach her nails.These nails are painful walking and wearing shoes.  This patient presents for at risk foot care today.  General Appearance  Alert, conversant and in no acute stress.  Vascular  Dorsalis pedis and posterior tibial  pulses are palpable  bilaterally.  Capillary return is within normal limits  bilaterally. Temperature is within normal limits  bilaterally.  Neurologic  Senn-Weinstein monofilament wire test within normal limits  bilaterally. Muscle power within normal limits bilaterally.  Nails Thick disfigured discolored nails with subungual debris  from hallux to fifth toes bilaterally. No evidence of bacterial infection or drainage bilaterally.  Orthopedic  No limitations of motion  feet .  No crepitus or effusions noted.  HAV with hammer toes  B/L.  Skin  normotropic skin with no porokeratosis noted bilaterally.  No signs of infections or ulcers noted.     Onychomycosis  Pain in right toes  Pain in left toes  Consent was obtained for treatment procedures.   Mechanical debridement of nails 1-5  bilaterally performed with a nail nipper.  Filed with dremel without incident. No infection or ulcer.     Return office visit    12 weeks                Told patient to return for periodic foot care and evaluation due to potential at risk complications.   Gardiner Barefoot DPM

## 2022-09-26 NOTE — Patient Instructions (Signed)
Wendy Figueroa , Thank you for taking time to come for your Medicare Wellness Visit. I appreciate your ongoing commitment to your health goals. Please review the following plan we discussed and let me know if I can assist you in the future.   Screening recommendations/referrals: Colonoscopy aged out Mammogram aged out Bone Density DUE SEPT 2024 Recommended yearly ophthalmology/optometry visit for glaucoma screening and checkup Recommended yearly dental visit for hygiene and checkup  Vaccinations: Influenza vaccine- due annually in September/October Pneumococcal vaccine up to date Tdap vaccine up to date Shingles vaccine up to date    Advanced directives: on file  Conditions/risks identified: advanced age  Next appointment: yearly    Preventive Care 60 Years and Older, Female Preventive care refers to lifestyle choices and visits with your health care provider that can promote health and wellness. What does preventive care include? A yearly physical exam. This is also called an annual well check. Dental exams once or twice a year. Routine eye exams. Ask your health care provider how often you should have your eyes checked. Personal lifestyle choices, including: Daily care of your teeth and gums. Regular physical activity. Eating a healthy diet. Avoiding tobacco and drug use. Limiting alcohol use. Practicing safe sex. Taking low-dose aspirin every day. Taking vitamin and mineral supplements as recommended by your health care provider. What happens during an annual well check? The services and screenings done by your health care provider during your annual well check will depend on your age, overall health, lifestyle risk factors, and family history of disease. Counseling  Your health care provider may ask you questions about your: Alcohol use. Tobacco use. Drug use. Emotional well-being. Home and relationship well-being. Sexual activity. Eating habits. History of  falls. Memory and ability to understand (cognition). Work and work Statistician. Reproductive health. Screening  You may have the following tests or measurements: Height, weight, and BMI. Blood pressure. Lipid and cholesterol levels. These may be checked every 5 years, or more frequently if you are over 101 years old. Skin check. Lung cancer screening. You may have this screening every year starting at age 16 if you have a 30-pack-year history of smoking and currently smoke or have quit within the past 15 years. Fecal occult blood test (FOBT) of the stool. You may have this test every year starting at age 74. Flexible sigmoidoscopy or colonoscopy. You may have a sigmoidoscopy every 5 years or a colonoscopy every 10 years starting at age 60. Hepatitis C blood test. Hepatitis B blood test. Sexually transmitted disease (STD) testing. Diabetes screening. This is done by checking your blood sugar (glucose) after you have not eaten for a while (fasting). You may have this done every 1-3 years. Bone density scan. This is done to screen for osteoporosis. You may have this done starting at age 18. Mammogram. This may be done every 1-2 years. Talk to your health care provider about how often you should have regular mammograms. Talk with your health care provider about your test results, treatment options, and if necessary, the need for more tests. Vaccines  Your health care provider may recommend certain vaccines, such as: Influenza vaccine. This is recommended every year. Tetanus, diphtheria, and acellular pertussis (Tdap, Td) vaccine. You may need a Td booster every 10 years. Zoster vaccine. You may need this after age 36. Pneumococcal 13-valent conjugate (PCV13) vaccine. One dose is recommended after age 45. Pneumococcal polysaccharide (PPSV23) vaccine. One dose is recommended after age 31. Talk to your health care provider about  which screenings and vaccines you need and how often you need  them. This information is not intended to replace advice given to you by your health care provider. Make sure you discuss any questions you have with your health care provider. Document Released: 09/04/2015 Document Revised: 04/27/2016 Document Reviewed: 06/09/2015 Elsevier Interactive Patient Education  2017 [employer] Prevention in the Home Falls can cause injuries. They can happen to people of all ages. There are many things you can do to make your home safe and to help prevent falls. What can I do on the outside of my home? Regularly fix the edges of walkways and driveways and fix any cracks. Remove anything that might make you trip as you walk through a door, such as a raised step or threshold. Trim any bushes or trees on the path to your home. Use bright outdoor lighting. Clear any walking paths of anything that might make someone trip, such as rocks or tools. Regularly check to see if handrails are loose or broken. Make sure that both sides of any steps have handrails. Any raised decks and porches should have guardrails on the edges. Have any leaves, snow, or ice cleared regularly. Use sand or salt on walking paths during winter. Clean up any spills in your garage right away. This includes oil or grease spills. What can I do in the bathroom? Use night lights. Install grab bars by the toilet and in the tub and shower. Do not use towel bars as grab bars. Use non-skid mats or decals in the tub or shower. If you need to sit down in the shower, use a plastic, non-slip stool. Keep the floor dry. Clean up any water that spills on the floor as soon as it happens. Remove soap buildup in the tub or shower regularly. Attach bath mats securely with double-sided non-slip rug tape. Do not have throw rugs and other things on the floor that can make you trip. What can I do in the bedroom? Use night lights. Make sure that you have a light by your bed that is easy to reach. Do not use  any sheets or blankets that are too big for your bed. They should not hang down onto the floor. Have a firm chair that has side arms. You can use this for support while you get dressed. Do not have throw rugs and other things on the floor that can make you trip. What can I do in the kitchen? Clean up any spills right away. Avoid walking on wet floors. Keep items that you use a lot in easy-to-reach places. If you need to reach something above you, use a strong step stool that has a grab bar. Keep electrical cords out of the way. Do not use floor polish or wax that makes floors slippery. If you must use wax, use non-skid floor wax. Do not have throw rugs and other things on the floor that can make you trip. What can I do with my stairs? Do not leave any items on the stairs. Make sure that there are handrails on both sides of the stairs and use them. Fix handrails that are broken or loose. Make sure that handrails are as long as the stairways. Check any carpeting to make sure that it is firmly attached to the stairs. Fix any carpet that is loose or worn. Avoid having throw rugs at the top or bottom of the stairs. If you do have throw rugs, attach them to the floor with  carpet tape. Make sure that you have a light switch at the top of the stairs and the bottom of the stairs. If you do not have them, ask someone to add them for you. What else can I do to help prevent falls? Wear shoes that: Do not have high heels. Have rubber bottoms. Are comfortable and fit you well. Are closed at the toe. Do not wear sandals. If you use a stepladder: Make sure that it is fully opened. Do not climb a closed stepladder. Make sure that both sides of the stepladder are locked into place. Ask someone to hold it for you, if possible. Clearly mark and make sure that you can see: Any grab bars or handrails. First and last steps. Where the edge of each step is. Use tools that help you move around (mobility aids)  if they are needed. These include: Canes. Walkers. Scooters. Crutches. Turn on the lights when you go into a dark area. Replace any light bulbs as soon as they burn out. Set up your furniture so you have a clear path. Avoid moving your furniture around. If any of your floors are uneven, fix them. If there are any pets around you, be aware of where they are. Review your medicines with your doctor. Some medicines can make you feel dizzy. This can increase your chance of falling. Ask your doctor what other things that you can do to help prevent falls. This information is not intended to replace advice given to you by your health care provider. Make sure you discuss any questions you have with your health care provider. Document Released: 06/04/2009 Document Revised: 01/14/2016 Document Reviewed: 09/12/2014 Elsevier Interactive Patient Education  2017 [employer].

## 2022-09-26 NOTE — Progress Notes (Signed)
Subjective:   Wendy Figueroa is a 77 y.o. female who presents for Medicare Annual (Subsequent) preventive examination.  Review of Systems     Cardiac Risk Factors include: obesity (BMI >30kg/m2);family history of premature cardiovascular disease;advanced age (>65mn, >>37women);sedentary lifestyle;hypertension     Objective:    Today's Vitals   09/26/22 1020  BP: 122/82  Pulse: 85  Temp: (!) 97.5 F (36.4 C)  TempSrc: Temporal  SpO2: 98%  Weight: 227 lb (103 kg)  Height: '5\' 4"'$  (1.626 m)   Body mass index is 38.96 kg/m.     09/26/2022   10:23 AM 01/10/2022    9:27 AM 12/27/2021   10:22 AM 08/27/2021    8:31 AM 08/19/2021   11:07 AM 02/19/2021   10:30 AM 08/19/2020   10:14 AM  Advanced Directives  Does Patient Have a Medical Advance Directive? Yes Yes Yes Yes Yes Yes No  Type of Advance Directive Out of facility DNR (pink MOST or yellow form) Out of facility DNR (pink MOST or yellow form) Out of facility DNR (pink MOST or yellow form) Out of facility DNR (pink MOST or yellow form) Out of facility DNR (pink MOST or yellow form) Out of facility DNR (pink MOST or yellow form)   Does patient want to make changes to medical advance directive? No - Patient declined No - Patient declined No - Patient declined No - Patient declined No - Patient declined No - Patient declined   Would patient like information on creating a medical advance directive?       Yes (MAU/Ambulatory/Procedural Areas - Information given)  Pre-existing out of facility DNR order (yellow form or pink MOST form) Pink MOST form placed in chart (order not valid for inpatient use) Pink MOST form placed in chart (order not valid for inpatient use) Pink MOST form placed in chart (order not valid for inpatient use) Pink MOST form placed in chart (order not valid for inpatient use) Pink MOST form placed in chart (order not valid for inpatient use) Pink MOST form placed in chart (order not valid for inpatient use)     Current  Medications (verified) Outpatient Encounter Medications as of 09/26/2022  Medication Sig   albuterol (PROVENTIL HFA;VENTOLIN HFA) 108 (90 Base) MCG/ACT inhaler Inhale 2 puffs into the lungs every 6 (six) hours as needed for wheezing or shortness of breath.   budesonide-formoterol (SYMBICORT) 160-4.5 MCG/ACT inhaler Inhale 2 puffs into the lungs 2 (two) times daily.   Calcium Carb-Cholecalciferol (CALCIUM 600 + D PO) Take 1 tablet by mouth 2 (two) times daily.    cetirizine (ZYRTEC) 10 MG tablet Take 10 mg by mouth daily.   chlorpheniramine-HYDROcodone (TUSSIONEX) 10-8 MG/5ML Take 5 mLs by mouth every 12 (twelve) hours as needed for cough.   DULoxetine (CYMBALTA) 60 MG capsule TAKE 1 CAPSULE (60 MG TOTAL) BY MOUTH DAILY. DX (F43.22)   EPINEPHrine 0.3 mg/0.3 mL IJ SOAJ injection epinephrine 0.3 mg/0.3 mL injection, auto-injector   fluticasone (FLONASE) 50 MCG/ACT nasal spray 2 sprays daily.   furosemide (LASIX) 40 MG tablet TAKE 1 TABLET BY MOUTH EVERY DAY   levocetirizine (XYZAL) 5 MG tablet Take 1 tablet (5 mg total) by mouth every evening.   losartan (COZAAR) 100 MG tablet TAKE 1 TABLET BY MOUTH EVERY DAY   montelukast (SINGULAIR) 10 MG tablet Take 10 mg by mouth at bedtime.   Multiple Vitamins-Minerals (MACULAR VITAMIN BENEFIT PO) Take 1 capsule by mouth 2 (two) times daily.   neomycin-polymyxin b-dexamethasone (MAXITROL)  3.5-10000-0.1 OINT Place 3.5 application into the left eye as needed.   NIFEdipine (PROCARDIA XL/NIFEDICAL XL) 60 MG 24 hr tablet TAKE 1 TABLET BY MOUTH EVERY DAY   NON FORMULARY Allergy shots every 2 weeks   oxybutynin (DITROPAN) 5 MG tablet TAKE 1 TABLET BY MOUTH TWICE A DAY   pantoprazole (PROTONIX) 40 MG tablet TAKE 1 TABLET (40 MG TOTAL) BY MOUTH 2 (TWO) TIMES DAILY. FOR STOMACH   potassium chloride SA (KLOR-CON M20) 20 MEQ tablet TAKE TWO TABLETS BY MOUTH ONCE DAILY FOR POTASSIUM SUPPLEMENT   pravastatin (PRAVACHOL) 40 MG tablet TAKE 1 TABLET BY MOUTH EVERY DAY    SPIRIVA HANDIHALER 18 MCG inhalation capsule Place 18 mcg into inhaler and inhale daily.    SYSTANE ULTRA 0.4-0.3 % SOLN SMARTSIG:1 Drop(s) In Eye(s) As Needed   vitamin B-12 (CYANOCOBALAMIN) 1000 MCG tablet Take 1,000 mcg by mouth daily.   Wheat Dextrin (BENEFIBER PO) Take by mouth as needed.   Facility-Administered Encounter Medications as of 09/26/2022  Medication   0.9 %  sodium chloride infusion    Allergies (verified) Atenolol, Lisinopril, Meperidine hcl, Penicillin g sodium, Demerol, Lisinopril-hydrochlorothiazide, and Penicillins   History: Past Medical History:  Diagnosis Date   Abnormal Pap smear    Over 44yr ago   ALKALINE PHOSPHATASE, ELEVATED 07/01/2009   Qualifier: Diagnosis of  By: NLenna GilfordMD, SDeborra Medina   Allergic rhinitis    Anemia    Arthritis    Asthma    Cataract    bilateral implants   Cough    occasional; 9-21: reports as ongoing cough , managing with cough medicine and scheduled and prn inhalers, reports mucus is clear and scant     Cystocele 11/05/08   Diverticulosis of colon    DJD (degenerative joint disease)    no per pt   Elevated alkaline phosphatase level    GERD (gastroesophageal reflux disease)    H/O urinary incontinence 11/06/2008   H/O varicella    Hard measles    Hx of colonic polyps    Hypercholesterolemia    Hypertension    Leg swelling    occasional   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 AOctavio Manns  BLADDER SUSPENSION N/A 05/15/2019   Procedure: TRANSVAGINAL TAPE (TVT) PROCEDURE;  Surgeon: REverett Graff MD;  [facility]  Service: Gynecology;  Laterality: N/A;   bladder tack  2011   BREAST SURGERY  1978   cyst removed from both removed   cataracts   approx 10 years   Dr HJohnnye Sima, ctaract with lens placement    COLONOSCOPY     CYSTOSCOPY N/A 05/15/2019   Procedure: CYSTOSCOPY;  Surgeon: REverett Graff MD;  [facility]  Service: Gynecology;  Laterality: N/A;   ESOPHAGOGASTRODUODENOSCOPY     Family History  Problem Relation Age of Onset  Aneurysm Mother    Lung cancer Father    Hypertension Daughter    Hypertension Daughter    Colon cancer Neg Hx    Esophageal cancer Neg Hx    Stomach cancer Neg Hx    Rectal cancer Neg Hx    Colon polyps Neg Hx    Social History   Socioeconomic History   Marital status: Married    Spouse name: [name]   Number of children: 2   Years of education: Not on file   Highest education level: Not on file  Occupational History   Occupation: LPN   Tobacco Use   Smoking status: Never   Smokeless tobacco: Never  Vaping Use   Vaping Use: Never used  Substance and Sexual Activity   Alcohol use: Yes    Alcohol/week: 2.0 standard drinks of alcohol    Types: 2 Cans of beer per week    Comment: 2 beers on the weekend   Drug use: No   Sexual activity: Not Currently    Partners: Male    Birth control/protection: None  Other Topics Concern   Not on file  Social History Narrative   Married   Never smoked   Alcohol few beers on week-end   No Advance Directive    Social Determinants of Health   Financial Resource Strain: Low Risk  (04/30/2018)   Overall Financial Resource Strain (CARDIA)    Difficulty of Paying Living Expenses: Not hard at all  Food Insecurity: No Food Insecurity (04/30/2018)   Hunger Vital Sign    Worried About Running Out of Food in the Last  Year: Never true    [place] in the Last Year: Never true  Transportation Needs: No Transportation Needs (04/30/2018)   PRAPARE - Hydrologist (Medical): No    Lack of Transportation (Non-Medical): No  Physical Activity: Inactive (04/30/2018)   Exercise Vital Sign    Days of Exercise per Week: 0 days    Minutes of Exercise per Session: 0 min  Stress: No Stress Concern Present (04/30/2018)   [place]    Feeling of Stress : Only a little  Social Connections: Socially Integrated (04/30/2018)   Social Connection and Isolation Panel [NHANES]    Frequency of Communication with Friends and Family: More than three times a week    Frequency of Social Gatherings with Friends and Family: More than three times a week    Attends Religious Services: More than 4 times per year    Active Member of [employer] or Organizations: Yes    Attends Music therapist: More than 4 times per year    Marital Status: Married    Tobacco Counseling Counseling given: Not Answered   Clinical Intake:  Pre-visit preparation completed: Yes  Pain : No/denies pain     BMI - recorded: 38 Nutritional Status: BMI > 30  Obese Nutritional Risks: None Diabetes: No  How often do you need to have someone help you when you read instructions, pamphlets, or other written materials from your doctor or pharmacy?: 1 - Never  Diabetic?no         Activities of Daily Living    09/26/2022   11:08 AM  In your present state of health, do you have any difficulty performing the following activities:  Hearing? 0  Vision? 0  Difficulty concentrating or making decisions? 0  Walking or climbing stairs? 0  Dressing or bathing?  0  Doing errands, shopping? 0  Preparing Food and eating ? N  Using the Toilet? N  In the past six months, have you accidently leaked urine? Y  Do you have problems with loss of bowel control? Y   Managing your Medications? N  Managing your Finances? N  Housekeeping or managing your Housekeeping? N    Patient Care Team: Lauree Chandler, NP as PCP - General (Geriatric Medicine) Monna Fam, MD as Consulting Physician (Ophthalmology)  Indicate any recent Medical Services you may have received from other than [facility] providers in the past year (date may be approximate).     Assessment:   This is a routine wellness examination for Wendy Figueroa.  Hearing/Vision screen Hearing Screening - Comments:: No hearing issues  Vision Screening - Comments:: Last eye exam less than 12 months ago with Dr.Hecker, pending appointment next week.   Dietary issues and exercise activities discussed: Current Exercise Habits: The patient does not participate in regular exercise at present   Goals Addressed   None    Depression Screen    09/26/2022   10:23 AM 08/27/2021   10:31 AM 08/19/2021   11:05 AM 08/19/2020   10:12 AM 05/12/2020    1:09 PM 02/17/2020   10:30 AM 08/19/2019   10:03 AM  PHQ 2/9 Scores  PHQ - 2 Score 0 0 0 0 0 0 0    Fall Risk    09/26/2022   10:23 AM 12/27/2021   10:22 AM 08/27/2021   10:31 AM 08/19/2021   11:06 AM 02/19/2021   10:30 AM  Fall Risk   Falls in the past year? 0 0 0 0 0  Number falls in past yr: 0 0 0 0 0  Injury with Fall? 0 0 0 0 0  Risk for fall due to : No Fall Risks No Fall Risks No Fall Risks No Fall Risks No Fall Risks  Follow up Falls evaluation completed Falls evaluation completed Falls evaluation completed Falls evaluation completed Falls evaluation completed    FALL RISK PREVENTION PERTAINING TO THE HOME:  Any stairs in or around the home? Yes  If so, are there any without handrails? No  Home free of loose throw rugs in walkways, pet beds, electrical cords, etc? Yes  Adequate lighting in your home to reduce risk of falls? Yes   ASSISTIVE DEVICES UTILIZED TO PREVENT FALLS:  Life alert? No  Use of a cane, walker or w/c? No  Grab bars in the  bathroom? Yes  Shower chair or bench in shower? No  Elevated toilet seat or a handicapped toilet? No   TIMED UP AND GO:  Was the test performed? No .    Cognitive Function:    09/26/2022   11:01 AM 04/30/2018    8:59 AM 01/18/2017    8:53 AM 09/09/2016   10:17 AM 05/29/2015   10:00 AM  MMSE - Mini Mental State Exam  Orientation to time '3 4 5 4 5  '$ Orientation to Place '5 5 5 5 5  '$ Registration '3 3 3 3 3  '$ Attention/ Calculation '5 4 5 3 5  '$ Recall '3 2 2 2 3  '$ Language- name 2 objects '2 2 2 2 2  '$ Language- repeat '1 1 1 1 1  '$ Language- follow 3 step command '3 3 3 2 3  '$ Language- read & follow direction '1 1 1 1 1  '$ Write a sentence '1 1 1 1 1  '$ Copy design '1 1 1 1 1  '$ Total  score '28 27 29 25 30        '$ 08/19/2021   11:08 AM 05/12/2020    1:12 PM 05/06/2019    8:32 AM  6CIT Screen  What Year? 0 points 0 points 0 points  What month? 0 points 0 points 0 points  What time? 0 points 0 points 0 points  Count back from 20 0 points 0 points 0 points  Months in reverse 2 points 4 points 0 points  Repeat phrase 0 points 0 points 0 points  Total Score 2 points 4 points 0 points    Immunizations Immunization History  Administered Date(s) Administered   Fluad Quad(high Dose 65+) 05/25/2020, 05/19/2021, 05/15/2022   Influenza Split 06/11/2011, 05/27/2013   Influenza Whole 07/05/2010, 04/23/2012   Influenza, High Dose Seasonal PF 04/30/2018, 06/06/2019   Influenza,inj,Quad PF,6+ Mos 05/29/2015, 05/06/2016, 05/17/2017   Influenza-Unspecified 05/22/2014, 10/26/2015   PFIZER(Purple Top)SARS-COV-2 Vaccination 09/27/2019, 10/18/2019, 06/02/2020   Pfizer Covid-19 Vaccine Bivalent Booster 71yr & up 03/17/2021, 10/25/2021, 06/09/2022   Pneumococcal Conjugate-13 10/14/2014   Pneumococcal Polysaccharide-23 07/29/2010, 04/30/2021   Tdap 02/28/2017   Zoster Recombinat (Shingrix) 09/03/2021, 10/29/2021    TDAP status: Up to date  Flu Vaccine status: Up to date  Pneumococcal vaccine status: Up to  date  Covid-19 vaccine status: Information provided on how to obtain vaccines.   Qualifies for Shingles Vaccine? Yes   Zostavax completed No   Shingrix Completed?: Yes  Screening Tests Health Maintenance  Topic Date Due   COVID-19 Vaccine (7 - 2023-24 season) 12/25/2022 (Originally 08/04/2022)   Medicare Annual Wellness (AWV)  09/27/2023   DTaP/Tdap/Td (2 - Td or Tdap) 03/01/2027   Pneumonia Vaccine 77 Years old  Completed   INFLUENZA VACCINE  Completed   DEXA SCAN  Completed   Zoster Vaccines- Shingrix  Completed   HPV VACCINES  Aged Out   COLONOSCOPY (Pts 45-435yrInsurance coverage will need to be confirmed)  Discontinued   Hepatitis C Screening  Discontinued    Health Maintenance  There are no preventive care reminders to display for this patient.   Colorectal cancer screening: No longer required.   Mammogram status: No longer required due to age.  Bone Density status: Completed 8/22. Results reflect: Bone density results: OSTEOPENIA. Repeat every 2 years.  Lung Cancer Screening: (Low Dose CT Chest recommended if Age 972-80ears, 30 pack-year currently smoking OR have quit w/in 15years.) does not qualify.   Lung Cancer Screening Referral: na  Additional Screening:  Hepatitis C Screening: does qualify; Completed 2021  Vision Screening: Recommended annual ophthalmology exams for early detection of glaucoma and other disorders of the eye. Is the patient up to date with their annual eye exam?  Yes  Who is the provider or what is the name of the office in which the patient attends annual eye exams? Dr HeHerbert Deanerf pt is not established with a provider, would they like to be referred to a provider to establish care? No .   Dental Screening: Recommended annual dental exams for proper oral hygiene  Community Resource Referral / Chronic Care Management: CRR required this visit?  No   CCM required this visit?  No      Plan:     I have personally reviewed and noted  the following in the patient's chart:   Medical and social history Use of alcohol, tobacco or illicit drugs  Current medications and supplements including opioid prescriptions. Patient is not currently taking opioid prescriptions. Functional ability and status Nutritional status  Physical activity Advanced directives List of other physicians Hospitalizations, surgeries, and ER visits in previous 12 months Vitals Screenings to include cognitive, depression, and falls Referrals and appointments  In addition, I have reviewed and discussed with patient certain preventive protocols, quality metrics, and best practice recommendations. A written personalized care plan for preventive services as well as general preventive health recommendations were provided to patient.     Lauree Chandler, NP   09/26/2022   Place of service: [facility]

## 2022-10-03 ENCOUNTER — Emergency Department (HOSPITAL_COMMUNITY): Payer: Medicare Other

## 2022-10-03 ENCOUNTER — Encounter (HOSPITAL_COMMUNITY): Payer: Self-pay | Admitting: Emergency Medicine

## 2022-10-03 ENCOUNTER — Other Ambulatory Visit: Payer: Self-pay

## 2022-10-03 ENCOUNTER — Emergency Department (HOSPITAL_COMMUNITY)
Admission: EM | Admit: 2022-10-03 | Discharge: 2022-10-03 | Disposition: A | Discharge status: Home / Self Care | Payer: Medicare Other | Attending: Emergency Medicine | Admitting: Emergency Medicine

## 2022-10-03 DIAGNOSIS — Z1152 Encounter for screening for COVID-19: Secondary | ICD-10-CM | POA: Insufficient documentation

## 2022-10-03 DIAGNOSIS — Z79899 Other long term (current) drug therapy: Secondary | ICD-10-CM | POA: Insufficient documentation

## 2022-10-03 DIAGNOSIS — Z7951 Long term (current) use of inhaled steroids: Secondary | ICD-10-CM | POA: Diagnosis not present

## 2022-10-03 DIAGNOSIS — J441 Chronic obstructive pulmonary disease with (acute) exacerbation: Secondary | ICD-10-CM | POA: Diagnosis not present

## 2022-10-03 DIAGNOSIS — I10 Essential (primary) hypertension: Secondary | ICD-10-CM | POA: Diagnosis not present

## 2022-10-03 DIAGNOSIS — R0602 Shortness of breath: Secondary | ICD-10-CM | POA: Diagnosis present

## 2022-10-03 LAB — CBC
HCT: 37 % (ref 36.0–46.0)
Hemoglobin: 11.5 g/dL — ABNORMAL LOW (ref 12.0–15.0)
MCH: 26.2 pg (ref 26.0–34.0)
MCHC: 31.1 g/dL (ref 30.0–36.0)
MCV: 84.3 fL (ref 80.0–100.0)
Platelets: 259 10*3/uL (ref 150–400)
RBC: 4.39 MIL/uL (ref 3.87–5.11)
RDW: 15.2 % (ref 11.5–15.5)
WBC: 6.4 10*3/uL (ref 4.0–10.5)
nRBC: 0 % (ref 0.0–0.2)

## 2022-10-03 LAB — COMPREHENSIVE METABOLIC PANEL
ALT: 24 U/L (ref 0–44)
AST: 36 U/L (ref 15–41)
Albumin: 3.8 g/dL (ref 3.5–5.0)
Alkaline Phosphatase: 163 U/L — ABNORMAL HIGH (ref 38–126)
Anion gap: 5 (ref 5–15)
BUN: 13 mg/dL (ref 8–23)
CO2: 25 mmol/L (ref 22–32)
Calcium: 9.3 mg/dL (ref 8.9–10.3)
Chloride: 110 mmol/L (ref 98–111)
Creatinine, Ser: 0.75 mg/dL (ref 0.44–1.00)
GFR, Estimated: >60 mL/min (ref >60)
Glucose, Bld: 111 mg/dL — ABNORMAL HIGH (ref 70–99)
Potassium: 3.6 mmol/L (ref 3.5–5.1)
Sodium: 140 mmol/L (ref 135–145)
Total Bilirubin: 0.5 mg/dL (ref 0.3–1.2)
Total Protein: 7.9 g/dL (ref 6.5–8.1)

## 2022-10-03 LAB — RESP PANEL BY RT-PCR (RSV, FLU A&B, COVID)  RVPGX2
Influenza A by PCR: NEGATIVE
Influenza B by PCR: NEGATIVE
Resp Syncytial Virus by PCR: NEGATIVE
SARS Coronavirus 2 by RT PCR: NEGATIVE

## 2022-10-03 LAB — BRAIN NATRIURETIC PEPTIDE: B Natriuretic Peptide: 19.9 pg/mL (ref 0.0–100.0)

## 2022-10-03 MED ORDER — ALBUTEROL SULFATE (2.5 MG/3ML) 0.083% IN NEBU
2.5000 mg | INHALATION_SOLUTION | Freq: Once | RESPIRATORY_TRACT | Status: AC
  Administered 2022-10-03: 2.5 mg via RESPIRATORY_TRACT
  Filled 2022-10-03: qty 3

## 2022-10-03 MED ORDER — IPRATROPIUM-ALBUTEROL 0.5-2.5 (3) MG/3ML IN SOLN
3.0000 mL | Freq: Once | RESPIRATORY_TRACT | Status: AC
  Administered 2022-10-03: 3 mL via RESPIRATORY_TRACT
  Filled 2022-10-03: qty 3

## 2022-10-03 MED ORDER — DOXYCYCLINE HYCLATE 100 MG PO TABS
100.0000 mg | ORAL_TABLET | Freq: Once | ORAL | Status: AC
  Administered 2022-10-03: 100 mg via ORAL
  Filled 2022-10-03: qty 1

## 2022-10-03 MED ORDER — PREDNISONE 20 MG PO TABS: ORAL_TABLET | ORAL | 0 refills | Status: DC

## 2022-10-03 MED ORDER — PREDNISONE 20 MG PO TABS
60.0000 mg | ORAL_TABLET | Freq: Once | ORAL | Status: AC
  Administered 2022-10-03: 60 mg via ORAL
  Filled 2022-10-03: qty 3

## 2022-10-03 MED ORDER — ALBUTEROL SULFATE HFA 108 (90 BASE) MCG/ACT IN AERS
2.0000 | INHALATION_SPRAY | Freq: Once | RESPIRATORY_TRACT | Status: DC
  Filled 2022-10-03: qty 6.7

## 2022-10-03 MED ORDER — DOXYCYCLINE HYCLATE 100 MG PO CAPS: 100.0000 mg | ORAL_CAPSULE | Freq: Two times a day (BID) | ORAL | 0 refills | Status: DC

## 2022-10-03 NOTE — ED Provider Triage Note (Signed)
Emergency Medicine Provider Triage Evaluation Note  Wendy Figueroa , a 77 y.o. female  was evaluated in triage.  Pt complains of shortness of breath.  Pt has asthma.  Pt reports she became sick after going to the dentist last week   Review of Systems  Positive: cough Negative: fever  Physical Exam  BP (!) 190/101 (BP Location: Right Arm)   Pulse (!) 101   Temp 98.4 F (36.9 C) (Oral)   Resp 18   SpO2 100%  Gen:   Awake, no distress   Resp:  Normal effort  MSK:   Moves extremities without difficulty  Other:    Medical Decision Making  Medically screening exam initiated at 11:36 AM.  Appropriate orders placed.  KIMBALL VANDER was informed that the remainder of the evaluation will be completed by another provider, this initial triage assessment does not replace that evaluation, and the importance of remaining in the ED until their evaluation is complete.     Fransico Meadow, [place] 10/03/22 1137

## 2022-10-03 NOTE — Discharge Instructions (Signed)
Follow-up with your family doctor in a week for recheck

## 2022-10-03 NOTE — ED Triage Notes (Signed)
SOB and productive cough x 3 days. Denies fever, denies chest pain.

## 2022-10-03 NOTE — ED Provider Notes (Signed)
[facility] Provider Note   CSN: [id number] Arrival date & time: 10/03/22  1114     History {Add pertinent medical, surgical, social history, OB history to HPI:1} Chief Complaint  Patient presents with   Shortness of Breath   Cough    Wendy Figueroa is a 77 y.o. female.  Patient has a history of hypertension COPD.  Patient complains of cough with yellow sputum production and shortness of breath   Shortness of Breath Associated symptoms: cough   Cough Associated symptoms: shortness of breath        Home Medications Prior to Admission medications   Medication Sig Start Date End Date Taking? Authorizing Provider  doxycycline (VIBRAMYCIN) 100 MG capsule Take 1 capsule (100 mg total) by mouth 2 (two) times daily. One po bid x 7 days 10/03/22  Yes Milton Ferguson, MD  predniSONE (DELTASONE) 20 MG tablet 2 tabs po daily x 3 days 10/03/22  Yes Milton Ferguson, MD  albuterol (PROVENTIL HFA;VENTOLIN HFA) 108 (90 Base) MCG/ACT inhaler Inhale 2 puffs into the lungs every 6 (six) hours as needed for wheezing or shortness of breath.    [provider]  budesonide-formoterol (SYMBICORT) 160-4.5 MCG/ACT inhaler Inhale 2 puffs into the lungs 2 (two) times daily. 04/25/18   Tanda Rockers, MD  Calcium Carb-Cholecalciferol (CALCIUM 600 + D PO) Take 1 tablet by mouth 2 (two) times daily.     [provider]  cetirizine (ZYRTEC) 10 MG tablet Take 10 mg by mouth daily.    [provider]  chlorpheniramine-HYDROcodone (TUSSIONEX) 10-8 MG/5ML Take 5 mLs by mouth every 12 (twelve) hours as needed for cough. 06/03/22   Lauree Chandler, NP  DULoxetine (CYMBALTA) 60 MG capsule TAKE 1 CAPSULE (60 MG TOTAL) BY MOUTH DAILY. DX (F43.22) 08/25/22   Lauree Chandler, NP  EPINEPHrine 0.3 mg/0.3 mL IJ SOAJ injection epinephrine 0.3 mg/0.3 mL injection, auto-injector    [provider]  fluticasone (FLONASE) 50 MCG/ACT nasal spray  2 sprays daily. 12/10/14   [provider]  furosemide (LASIX) 40 MG tablet TAKE 1 TABLET BY MOUTH EVERY DAY 09/21/22   Lauree Chandler, NP  levocetirizine (XYZAL) 5 MG tablet Take 1 tablet (5 mg total) by mouth every evening. 08/29/17   [name], DO  losartan (COZAAR) 100 MG tablet TAKE 1 TABLET BY MOUTH EVERY DAY 07/26/22   Lauree Chandler, NP  montelukast (SINGULAIR) 10 MG tablet Take 10 mg by mouth at bedtime.    [provider]  Multiple Vitamins-Minerals (MACULAR VITAMIN BENEFIT PO) Take 1 capsule by mouth 2 (two) times daily.    [provider]  neomycin-polymyxin b-dexamethasone (MAXITROL) 3.5-10000-0.1 OINT Place 3.5 application into the left eye as needed. 08/24/18   [provider]  NIFEdipine (PROCARDIA XL/NIFEDICAL XL) 60 MG 24 hr tablet TAKE 1 TABLET BY MOUTH EVERY DAY 08/25/22   Lauree Chandler, NP  NON FORMULARY Allergy shots every 2 weeks    [provider]  oxybutynin (DITROPAN) 5 MG tablet TAKE 1 TABLET BY MOUTH TWICE A DAY 08/25/22   Lauree Chandler, NP  pantoprazole (PROTONIX) 40 MG tablet TAKE 1 TABLET (40 MG TOTAL) BY MOUTH 2 (TWO) TIMES DAILY. FOR STOMACH 05/12/22   Lauree Chandler, NP  potassium chloride SA (KLOR-CON M20) 20 MEQ tablet TAKE TWO TABLETS BY MOUTH ONCE DAILY FOR POTASSIUM SUPPLEMENT 08/25/22   Lauree Chandler, NP  pravastatin (PRAVACHOL) 40 MG tablet TAKE 1  TABLET BY MOUTH EVERY DAY 08/25/22   Lauree Chandler, NP  SPIRIVA HANDIHALER 18 MCG inhalation capsule Place 18 mcg into inhaler and inhale daily.  10/30/14   [provider]  SYSTANE ULTRA 0.4-0.3 % SOLN SMARTSIG:1 Drop(s) In Eye(s) As Needed 01/16/19   [provider]  vitamin B-12 (CYANOCOBALAMIN) 1000 MCG tablet Take 1,000 mcg by mouth daily.    [provider]  Wheat Dextrin (BENEFIBER PO) Take by mouth as needed.    [provider]      Allergies    Atenolol, Lisinopril, Meperidine hcl, Penicillin g sodium,  Demerol, Lisinopril-hydrochlorothiazide, and Penicillins    Review of Systems   Review of Systems  Respiratory:  Positive for cough and shortness of breath.     Physical Exam Updated Vital Signs BP (!) 148/86   Pulse 98   Temp 97.7 F (36.5 C) (Oral)   Resp (!) 23   Ht 5' 4"$  (1.626 m)   Wt 103 kg   SpO2 96%   BMI 38.98 kg/m  Physical Exam  ED Results / Procedures / Treatments   Labs (all labs ordered are listed, but only abnormal results are displayed) Labs Reviewed  COMPREHENSIVE METABOLIC PANEL - Abnormal; Notable for the following components:      Result Value   Glucose, Bld 111 (*)    Alkaline Phosphatase 163 (*)    All other components within normal limits  CBC - Abnormal; Notable for the following components:   Hemoglobin 11.5 (*)    All other components within normal limits  RESP PANEL BY RT-PCR (RSV, FLU A&B, COVID)  RVPGX2  BRAIN NATRIURETIC PEPTIDE    EKG None  Radiology DG Chest Port 1 View  Result Date: 10/03/2022 CLINICAL DATA:  Shortness of breath EXAM: PORTABLE CHEST 1 VIEW COMPARISON:  X-ray 06/04/2020 FINDINGS: Minimal linear opacity left lung base likely scar or atelectasis. No consolidation, pneumothorax or effusion. Normal cardiopericardial silhouette without edema. Tortuous aorta. IMPRESSION: Mild left basilar scar or atelectasis. Otherwise no acute cardiopulmonary disease Electronically Signed   By: Jill Side M.D.   On: 10/03/2022 12:51    Procedures Procedures  {Document cardiac monitor, telemetry assessment procedure when appropriate:1}  Medications Ordered in ED Medications  albuterol (VENTOLIN HFA) 108 (90 Base) MCG/ACT inhaler 2 puff (has no administration in time range)  ipratropium-albuterol (DUONEB) 0.5-2.5 (3) MG/3ML nebulizer solution 3 mL (3 mLs Nebulization Given 10/03/22 1707)  albuterol (PROVENTIL) (2.5 MG/3ML) 0.083% nebulizer solution 2.5 mg (2.5 mg Nebulization Given 10/03/22 1707)  predniSONE (DELTASONE) tablet 60 mg (60  mg Oral Given 10/03/22 1706)  doxycycline (VIBRA-TABS) tablet 100 mg (100 mg Oral Given 10/03/22 1706)    ED Course/ Medical Decision Making/ A&P   {   Click here for ABCD2, HEART and other calculatorsREFRESH Note before signing :1}                          Medical Decision Making Risk Prescription drug management.   Patient with bronchospasm and bronchitis.  She is sent home on doxycycline and prednisone and will use her albuterol inhaler and follow-up with PCP  {Document critical care time when appropriate:1} {Document review of labs and clinical decision tools ie heart score, Chads2Vasc2 etc:1}  {Document your independent review of radiology images, and any outside records:1} {Document your discussion with family members, caretakers, and with consultants:1} {Document social determinants of health affecting pt's care:1} {Document your decision making why or why not admission,  treatments were needed:1} Final Clinical Impression(s) / ED Diagnoses Final diagnoses:  COPD exacerbation ([place])    Rx / DC Orders ED Discharge Orders          Ordered    predniSONE (DELTASONE) 20 MG tablet        10/03/22 1819    doxycycline (VIBRAMYCIN) 100 MG capsule  2 times daily        10/03/22 1819

## 2022-10-04 ENCOUNTER — Telehealth: Payer: Self-pay

## 2022-10-04 NOTE — Telephone Encounter (Signed)
Transition Care Management Unsuccessful Follow-up Telephone Call  Date of discharge and from where:  [facility], 10/03/2021  Attempts:  1st Attempt  Reason for unsuccessful TCM follow-up call:  Unable to leave message

## 2022-10-06 ENCOUNTER — Telehealth: Payer: Self-pay

## 2022-10-06 NOTE — Transitions of Care (Post Inpatient/ED Visit) (Signed)
   10/06/2022  Name: Wendy Figueroa MRN: [id number] DOB: 30-Mar-1946  Today's TOC FU Call Status:    Attempted to reach the patient regarding the most recent Inpatient/ED visit.  Follow Up Plan: No further outreach attempts will be made at this time. We have been unable to contact the patient. Was not able to leave voice message for return call  [place]

## 2022-10-18 ENCOUNTER — Other Ambulatory Visit: Payer: Self-pay | Admitting: *Deleted

## 2022-10-18 DIAGNOSIS — R053 Chronic cough: Secondary | ICD-10-CM

## 2022-10-18 MED ORDER — HYDROCOD POLI-CHLORPHE POLI ER 10-8 MG/5ML PO SUER: 5.0000 mL | Freq: Two times a day (BID) | ORAL | 0 refills | Status: DC | PRN

## 2022-10-18 NOTE — Telephone Encounter (Signed)
Patient called requested refill.  Epic LR: 06/03/2022  Pended Rx and sent to [facility] for approval.

## 2022-11-24 ENCOUNTER — Other Ambulatory Visit: Payer: Self-pay | Admitting: Nurse Practitioner

## 2022-11-24 DIAGNOSIS — I10 Essential (primary) hypertension: Secondary | ICD-10-CM

## 2022-12-26 ENCOUNTER — Encounter: Payer: Self-pay | Admitting: Podiatry

## 2022-12-26 ENCOUNTER — Ambulatory Visit: Payer: Medicare Other | Admitting: Podiatry

## 2022-12-26 DIAGNOSIS — M79674 Pain in right toe(s): Secondary | ICD-10-CM | POA: Diagnosis not present

## 2022-12-26 DIAGNOSIS — M79675 Pain in left toe(s): Secondary | ICD-10-CM | POA: Diagnosis not present

## 2022-12-26 DIAGNOSIS — I872 Venous insufficiency (chronic) (peripheral): Secondary | ICD-10-CM | POA: Diagnosis not present

## 2022-12-26 DIAGNOSIS — B351 Tinea unguium: Secondary | ICD-10-CM

## 2022-12-26 NOTE — Progress Notes (Signed)
This patient returns to my office for at risk foot care.  This patient requires this care by a professional since this patient will be at risk due to having venous insufficiency  This patient is unable to cut nails herself since the patient cannot reach her nails.These nails are painful walking and wearing shoes.  This patient presents for at risk foot care today.  General Appearance  Alert, conversant and in no acute stress.  Vascular  Dorsalis pedis and posterior tibial  pulses are palpable  bilaterally.  Capillary return is within normal limits  bilaterally. Temperature is within normal limits  bilaterally.  Neurologic  Senn-Weinstein monofilament wire test within normal limits  bilaterally. Muscle power within normal limits bilaterally.  Nails Thick disfigured discolored nails with subungual debris  from hallux to fifth toes bilaterally. No evidence of bacterial infection or drainage bilaterally.  Orthopedic  No limitations of motion  feet .  No crepitus or effusions noted.  HAV with hammer toes  B/L.  Skin  normotropic skin with no porokeratosis noted bilaterally.  No signs of infections or ulcers noted.     Onychomycosis  Pain in right toes  Pain in left toes  Consent was obtained for treatment procedures.   Mechanical debridement of nails 1-5  bilaterally performed with a nail nipper.  Filed with dremel without incident. No infection or ulcer.     Return office visit    12 weeks                Told patient to return for periodic foot care and evaluation due to potential at risk complications.   Milan Perkins DPM  

## 2023-01-05 ENCOUNTER — Other Ambulatory Visit: Payer: Self-pay | Admitting: Nurse Practitioner

## 2023-01-05 DIAGNOSIS — F4322 Adjustment disorder with anxiety: Secondary | ICD-10-CM

## 2023-01-23 ENCOUNTER — Ambulatory Visit (INDEPENDENT_AMBULATORY_CARE_PROVIDER_SITE_OTHER): Payer: Medicare Other | Admitting: Nurse Practitioner

## 2023-01-23 ENCOUNTER — Encounter: Payer: Self-pay | Admitting: Nurse Practitioner

## 2023-01-23 VITALS — BP 132/80 | HR 94 | Temp 96.5°F | Ht 64.0 in | Wt 227.6 lb

## 2023-01-23 DIAGNOSIS — D649 Anemia, unspecified: Secondary | ICD-10-CM | POA: Diagnosis not present

## 2023-01-23 DIAGNOSIS — R053 Chronic cough: Secondary | ICD-10-CM

## 2023-01-23 DIAGNOSIS — I10 Essential (primary) hypertension: Secondary | ICD-10-CM | POA: Diagnosis not present

## 2023-01-23 DIAGNOSIS — R600 Localized edema: Secondary | ICD-10-CM | POA: Diagnosis not present

## 2023-01-23 DIAGNOSIS — M8580 Other specified disorders of bone density and structure, unspecified site: Secondary | ICD-10-CM | POA: Diagnosis not present

## 2023-01-23 DIAGNOSIS — K219 Gastro-esophageal reflux disease without esophagitis: Secondary | ICD-10-CM | POA: Diagnosis not present

## 2023-01-23 DIAGNOSIS — N3281 Overactive bladder: Secondary | ICD-10-CM

## 2023-01-23 DIAGNOSIS — F4322 Adjustment disorder with anxiety: Secondary | ICD-10-CM

## 2023-01-23 DIAGNOSIS — J4520 Mild intermittent asthma, uncomplicated: Secondary | ICD-10-CM

## 2023-01-23 MED ORDER — HYDROCOD POLI-CHLORPHE POLI ER 10-8 MG/5ML PO SUER: 5.0000 mL | Freq: Two times a day (BID) | ORAL | 0 refills | Status: DC | PRN

## 2023-01-23 NOTE — Progress Notes (Signed)
Careteam: Patient Care Team: Sharon Seller, NP as PCP - General (Geriatric Medicine) Mateo Flow, MD as Consulting Physician (Ophthalmology)  PLACE OF SERVICE:  [facility]  Advanced Directive information    Allergies  Allergen Reactions   Atenolol Other (See Comments)   Lisinopril Other (See Comments)   Meperidine Hcl Other (See Comments)   Penicillin G Sodium Other (See Comments)   Demerol Hives    All over the body    Lisinopril-Hydrochlorothiazide Hives    All over the body   Penicillins Hives    Has patient had a PCN reaction causing immediate rash, facial/tongue/throat swelling, SOB or lightheadedness with hypotension: No Has patient had a PCN reaction causing severe rash involving mucus membranes or skin necrosis: No Has patient had a PCN reaction that required hospitalization: No Has patient had a PCN reaction occurring within the last 10 years: No If all of the above answers are "NO", then may proceed with Cephalosporin use.   All over the body    Chief Complaint  Patient presents with   Medical Management of Chronic Issues    Patient presents today for a 6 month follow-up   Quality Metric Gaps    COVID#7      HPI: Patient is a 77 y.o. female for routine follow up  Reports she is doing well.  Had health screening by [employer].   Reports she has pain in her knees bilaterally- uses tylenol which helps with pain   Oab- continues on oxybutynin, doing well with medication. Has side effects but mild.   Constipation vs diarrhea- drinking prune juice which is helpful.   Chronic cough- flaring up. Did not have her cough medication. Reports she requested medication but did not get.  Had a COPD exacerbation in February- got really short of breath and had to go to ED.   Mood disorder- controlled on cymbalta.   Hyperlipidemia- on pravastatin  Review of Systems:  Review of Systems  Constitutional:  Negative for chills, fever and weight  loss.  HENT:  Negative for tinnitus.   Respiratory:  Positive for cough. Negative for sputum production and shortness of breath.   Cardiovascular:  Positive for leg swelling. Negative for chest pain and palpitations.  Gastrointestinal:  Negative for abdominal pain, constipation, diarrhea and heartburn.  Genitourinary:  Negative for dysuria, frequency and urgency.  Musculoskeletal:  Negative for back pain, falls, joint pain and myalgias.  Skin: Negative.   Neurological:  Negative for dizziness and headaches.  Psychiatric/Behavioral:  Negative for depression and memory loss. The patient does not have insomnia.     Past Medical History:  Diagnosis Date   Abnormal Pap smear    Over 17yrs ago   ALKALINE PHOSPHATASE, ELEVATED 07/01/2009   Qualifier: Diagnosis of  By: Kriste Basque MD, Lonzo Cloud    Allergic rhinitis    Anemia    Arthritis    Asthma    Cataract    bilateral implants   Cough    occasional; 9-21: reports as ongoing cough , managing with cough medicine and scheduled and prn inhalers, reports mucus is clear and scant     Cystocele 11/05/08   Diverticulosis of colon    DJD (degenerative joint disease)    no per pt   Elevated alkaline phosphatase level    GERD (gastroesophageal reflux disease)    H/O urinary incontinence 11/06/2008   H/O varicella    Hard measles    Hx of colonic polyps  Hypercholesterolemia    Hypertension    Leg swelling    occasional   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 Lance Morin   BLADDER SUSPENSION N/A 05/15/2019   Procedure: TRANSVAGINAL TAPE (TVT) PROCEDURE;  Surgeon: Osborn Coho, MD;  Location: [facility];  Service: Gynecology;  Laterality: N/A;   bladder tack  2011   BREAST SURGERY  1978   cyst removed from both removed   cataracts   approx 10 years   Dr Ninetta Lights , ctaract with lens placement    COLONOSCOPY     CYSTOSCOPY N/A 05/15/2019   Procedure: CYSTOSCOPY;  Surgeon: Osborn Coho, MD;  Location: [facility];  Service: Gynecology;  Laterality: N/A;   ESOPHAGOGASTRODUODENOSCOPY     Social History:   reports that she has never smoked. She has never used smokeless tobacco. She reports current alcohol use of about 2.0 standard drinks of alcohol per week. She reports that she does not use drugs.  Family History  Problem Relation Age of Onset   Aneurysm Mother    Lung cancer Father    Hypertension Daughter    Hypertension Daughter    Colon cancer Neg Hx    Esophageal cancer Neg Hx    Stomach cancer Neg Hx    Rectal cancer Neg Hx    Colon polyps Neg Hx     Medications: Patient's Medications  New Prescriptions   No medications on file  Previous Medications   ALBUTEROL (PROVENTIL HFA;VENTOLIN HFA) 108 (90 BASE) MCG/ACT INHALER    Inhale 2 puffs into the lungs every 6 (six) hours as needed for wheezing or shortness of breath.   BUDESONIDE-FORMOTEROL (SYMBICORT) 160-4.5 MCG/ACT INHALER    Inhale 2 puffs into the lungs 2 (two) times daily.   CALCIUM CARB-CHOLECALCIFEROL (CALCIUM 600 + D PO)    Take 1 tablet by mouth 2 (two) times daily.    CETIRIZINE (ZYRTEC) 10 MG TABLET    Take 10 mg by mouth daily.   DULOXETINE (CYMBALTA) 60 MG CAPSULE    TAKE 1 CAPSULE (60 MG TOTAL) BY MOUTH DAILY. DX (F43.22)   EPINEPHRINE 0.3 MG/0.3 ML IJ SOAJ INJECTION    epinephrine 0.3 mg/0.3 mL injection, auto-injector   FLUTICASONE (FLONASE) 50 MCG/ACT  NASAL SPRAY    2 sprays daily.   FUROSEMIDE (LASIX) 40 MG TABLET    TAKE 1 TABLET BY MOUTH EVERY DAY   LEVOCETIRIZINE (XYZAL) 5 MG TABLET    Take 1 tablet (5 mg total) by mouth every evening.   LOSARTAN (COZAAR) 100 MG TABLET    TAKE 1 TABLET BY MOUTH EVERY DAY   MONTELUKAST (SINGULAIR) 10 MG TABLET    Take 10 mg by mouth at bedtime.   MULTIPLE VITAMINS-MINERALS (MACULAR VITAMIN BENEFIT PO)    Take 1 capsule by mouth 2 (two) times daily.   NIFEDIPINE (PROCARDIA XL/NIFEDICAL XL)  60 MG 24 HR TABLET    TAKE 1 TABLET BY MOUTH EVERY DAY   NON FORMULARY    Allergy shots every 2 weeks   OXYBUTYNIN (DITROPAN) 5 MG TABLET    TAKE 1 TABLET BY MOUTH TWICE A DAY   PANTOPRAZOLE (PROTONIX) 40 MG TABLET    TAKE 1 TABLET (40 MG TOTAL) BY MOUTH 2 (TWO) TIMES DAILY. FOR STOMACH   POTASSIUM CHLORIDE SA (KLOR-CON M20) 20 MEQ TABLET    TAKE TWO TABLETS BY MOUTH ONCE DAILY FOR POTASSIUM SUPPLEMENT   PRAVASTATIN (PRAVACHOL) 40 MG TABLET    TAKE 1 TABLET BY MOUTH EVERY DAY   SPIRIVA HANDIHALER 18 MCG INHALATION CAPSULE    Place 18 mcg into inhaler and inhale daily.    SYSTANE ULTRA 0.4-0.3 % SOLN    SMARTSIG:1 Drop(s) In Eye(s) As Needed   VITAMIN B-12 (CYANOCOBALAMIN) 1000 MCG TABLET    Take 1,000 mcg by mouth daily.  Modified Medications   Modified Medication Previous Medication   CHLORPHENIRAMINE-HYDROCODONE (TUSSIONEX) 10-8 MG/5ML chlorpheniramine-HYDROcodone (TUSSIONEX) 10-8 MG/5ML      Take 5 mLs by mouth every 12 (twelve) hours as needed for cough.    Take 5 mLs by mouth every 12 (twelve) hours as needed for cough.  Discontinued Medications   DOXYCYCLINE (VIBRAMYCIN) 100 MG CAPSULE    Take 1 capsule (100 mg total) by mouth 2 (two) times daily. One po bid x 7 days   NEOMYCIN-POLYMYXIN B-DEXAMETHASONE (MAXITROL) 3.5-10000-0.1 OINT    Place 3.5 application into the left eye as needed.   PREDNISONE (DELTASONE) 20 MG TABLET    2 tabs po daily x 3 days   WHEAT DEXTRIN (BENEFIBER PO)    Take by mouth as needed.     Physical Exam:  Vitals:   01/23/23 1004  BP: 132/80  Pulse: 94  Temp: (!) 96.5 F (35.8 C)  SpO2: 98%  Weight: 227 lb 9.6 oz (103.2 kg)  Height: 5\' 4"  (1.626 m)   Body mass index is 39.07 kg/m. Wt Readings from Last 3 Encounters:  01/23/23 227 lb 9.6 oz (103.2 kg)  10/03/22 227 lb 1.2 oz (103 kg)  09/26/22 227 lb (103 kg)    Physical Exam Constitutional:      General: She is not in acute distress.    Appearance: She is well-developed. She is not diaphoretic.  HENT:     Head: Normocephalic and atraumatic.     Mouth/Throat:     Pharynx: No oropharyngeal exudate.  Eyes:     Conjunctiva/sclera: Conjunctivae normal.     Pupils: Pupils are equal, round, and reactive to light.  Cardiovascular:     Rate and Rhythm: Normal rate and regular rhythm.     Heart sounds: Normal heart sounds.  Pulmonary:     Effort: Pulmonary effort is normal.     Breath sounds: Normal breath sounds.  Abdominal:     General: Bowel sounds are normal.     Palpations: Abdomen is soft.  Musculoskeletal:     Cervical back: Normal range of motion and neck supple.     Right lower leg: Edema (1+) present.     Left lower leg: Edema (1+) present.  Skin:    General: Skin is warm and dry.  Neurological:     Mental Status: She is alert.  Psychiatric:        Mood and Affect: Mood normal.     Labs reviewed: Basic Metabolic Panel: Recent Labs    07/22/22 1022 10/03/22 1202  NA 140 140  K 4.6 3.6  CL 107 110  CO2 24 25  GLUCOSE 72 111*  BUN 14 13  CREATININE 0.77 0.75  CALCIUM 9.5 9.3   Liver Function Tests: Recent Labs    07/22/22 1022 10/03/22 1202  AST 25 36  ALT 20 24  ALKPHOS  --  163*  BILITOT 0.4 0.5  PROT 7.5 7.9  ALBUMIN  --  3.8   No results for input(s): "LIPASE", "AMYLASE" in the last 8760 hours. No results for input(s): "AMMONIA" in the last 8760 hours. CBC: Recent Labs    07/22/22 1022 10/03/22 1202  WBC 6.2 6.4  NEUTROABS 2,790  --   HGB 11.3* 11.5*  HCT  35.6 37.0  MCV 84.6 84.3  PLT 274 259   Lipid Panel: Recent Labs    07/22/22 1022  CHOL 161  HDL 62  LDLCALC 85  TRIG 67  CHOLHDL 2.6   TSH: No results for input(s): "TSH" in the last 8760 hours. A1C: Lab Results  Component Value Date   HGBA1C 5.6 11/02/2021     Assessment/Plan 1. Essential hypertension -Blood pressure well controlled, goal bp <140/90 Continue current medications and dietary modifications follow metabolic panel - Complete Metabolic Panel with eGFR - CBC with Differential/Platelet  2. Chronic cough Did not get her tussinonex refilled and was out for sometime, could not go to church or other places due to cough. Will refill at this time - chlorpheniramine-HYDROcodone (TUSSIONEX) 10-8 MG/5ML; Take 5 mLs by mouth every 12 (twelve) hours as needed for cough.  Dispense: 300 mL; Refill: 0  3. Morbid obesity (HCC) --education provided on healthy weight loss through increase in physical activity and proper nutrition   4. Bilateral leg edema -stable, encouraged to elevate legs above level of heart as tolerates, low sodium diet, compression hose as tolerates (on in am, off in pm)  5. Mild intermittent asthma, unspecified whether complicated Stable, continues on singulair and spiriva  6. Gastroesophageal reflux disease without esophagitis -stable on protonix  7. Overactive bladder -controlled on oxybutynin.   8. Adjustment disorder with anxious mood Controlled on cymbalta.  9. Osteopenia, unspecified location -Recommended to take calcium 600 mg twice daily with Vitamin D 2000 units daily and weight bearing activity 30 mins/5 days a week   Return in about 6 months (around 07/25/2023) for routine follow up.  Janene Harvey. Biagio Borg [facility] [phone]

## 2023-01-27 DIAGNOSIS — J301 Allergic rhinitis due to pollen: Secondary | ICD-10-CM | POA: Diagnosis not present

## 2023-01-27 DIAGNOSIS — J3089 Other allergic rhinitis: Secondary | ICD-10-CM | POA: Diagnosis not present

## 2023-01-27 DIAGNOSIS — J3081 Allergic rhinitis due to animal (cat) (dog) hair and dander: Secondary | ICD-10-CM | POA: Diagnosis not present

## 2023-01-28 LAB — CBC WITH DIFFERENTIAL/PLATELET
Absolute Monocytes: 484 cells/uL (ref 200–950)
Basophils Absolute: 39 cells/uL (ref 0–200)
Basophils Relative: 0.7 %
Eosinophils Absolute: 220 cells/uL (ref 15–500)
Eosinophils Relative: 4 %
HCT: 34.1 % — ABNORMAL LOW (ref 35.0–45.0)
Hemoglobin: 11 g/dL — ABNORMAL LOW (ref 11.7–15.5)
Lymphs Abs: 2426 cells/uL (ref 850–3900)
MCH: 26.9 pg — ABNORMAL LOW (ref 27.0–33.0)
MCHC: 32.3 g/dL (ref 32.0–36.0)
MCV: 83.4 fL (ref 80.0–100.0)
MPV: 11.1 fL (ref 7.5–12.5)
Monocytes Relative: 8.8 %
Neutro Abs: 2332 cells/uL (ref 1500–7800)
Neutrophils Relative %: 42.4 %
Platelets: 258 10*3/uL (ref 140–400)
RBC: 4.09 10*6/uL (ref 3.80–5.10)
RDW: 13.7 % (ref 11.0–15.0)
Total Lymphocyte: 44.1 %
WBC: 5.5 10*3/uL (ref 3.8–10.8)

## 2023-01-28 LAB — COMPLETE METABOLIC PANEL WITH GFR
AG Ratio: 1.1 (calc) (ref 1.0–2.5)
ALT: 15 U/L (ref 6–29)
AST: 19 U/L (ref 10–35)
Albumin: 3.8 g/dL (ref 3.6–5.1)
Alkaline phosphatase (APISO): 159 U/L — ABNORMAL HIGH (ref 37–153)
BUN: 15 mg/dL (ref 7–25)
CO2: 27 mmol/L (ref 20–32)
Calcium: 9.5 mg/dL (ref 8.6–10.4)
Chloride: 108 mmol/L (ref 98–110)
Creat: 0.78 mg/dL (ref 0.60–1.00)
Globulin: 3.5 g/dL (calc) (ref 1.9–3.7)
Glucose, Bld: 69 mg/dL (ref 65–99)
Potassium: 4.4 mmol/L (ref 3.5–5.3)
Sodium: 142 mmol/L (ref 135–146)
Total Bilirubin: 0.4 mg/dL (ref 0.2–1.2)
Total Protein: 7.3 g/dL (ref 6.1–8.1)
eGFR: 77 mL/min/{1.73_m2} (ref >=60)

## 2023-01-28 LAB — TEST AUTHORIZATION

## 2023-01-28 LAB — IRON,TIBC AND FERRITIN PANEL
%SAT: 22 % (calc) (ref 16–45)
Ferritin: 43 ng/mL (ref 16–288)
Iron: 70 ug/dL (ref 45–160)
TIBC: 315 mcg/dL (calc) (ref 250–450)

## 2023-02-03 DIAGNOSIS — J301 Allergic rhinitis due to pollen: Secondary | ICD-10-CM | POA: Diagnosis not present

## 2023-02-03 DIAGNOSIS — J3089 Other allergic rhinitis: Secondary | ICD-10-CM | POA: Diagnosis not present

## 2023-02-03 DIAGNOSIS — J3081 Allergic rhinitis due to animal (cat) (dog) hair and dander: Secondary | ICD-10-CM | POA: Diagnosis not present

## 2023-02-10 DIAGNOSIS — J3081 Allergic rhinitis due to animal (cat) (dog) hair and dander: Secondary | ICD-10-CM | POA: Diagnosis not present

## 2023-02-10 DIAGNOSIS — J3089 Other allergic rhinitis: Secondary | ICD-10-CM | POA: Diagnosis not present

## 2023-02-10 DIAGNOSIS — J301 Allergic rhinitis due to pollen: Secondary | ICD-10-CM | POA: Diagnosis not present

## 2023-03-03 DIAGNOSIS — J301 Allergic rhinitis due to pollen: Secondary | ICD-10-CM | POA: Diagnosis not present

## 2023-03-03 DIAGNOSIS — J3089 Other allergic rhinitis: Secondary | ICD-10-CM | POA: Diagnosis not present

## 2023-03-03 DIAGNOSIS — J3081 Allergic rhinitis due to animal (cat) (dog) hair and dander: Secondary | ICD-10-CM | POA: Diagnosis not present

## 2023-03-13 DIAGNOSIS — M8580 Other specified disorders of bone density and structure, unspecified site: Secondary | ICD-10-CM | POA: Diagnosis not present

## 2023-03-17 DIAGNOSIS — J3081 Allergic rhinitis due to animal (cat) (dog) hair and dander: Secondary | ICD-10-CM | POA: Diagnosis not present

## 2023-03-17 DIAGNOSIS — J3089 Other allergic rhinitis: Secondary | ICD-10-CM | POA: Diagnosis not present

## 2023-03-17 DIAGNOSIS — J301 Allergic rhinitis due to pollen: Secondary | ICD-10-CM | POA: Diagnosis not present

## 2023-03-28 ENCOUNTER — Encounter: Payer: Self-pay | Admitting: Podiatry

## 2023-03-28 ENCOUNTER — Ambulatory Visit: Payer: Medicare Other | Admitting: Podiatry

## 2023-03-28 DIAGNOSIS — M79675 Pain in left toe(s): Secondary | ICD-10-CM | POA: Diagnosis not present

## 2023-03-28 DIAGNOSIS — M79674 Pain in right toe(s): Secondary | ICD-10-CM

## 2023-03-28 DIAGNOSIS — B351 Tinea unguium: Secondary | ICD-10-CM | POA: Diagnosis not present

## 2023-03-28 DIAGNOSIS — I872 Venous insufficiency (chronic) (peripheral): Secondary | ICD-10-CM | POA: Diagnosis not present

## 2023-03-28 NOTE — Progress Notes (Signed)
This patient returns to my office for at risk foot care.  This patient requires this care by a professional since this patient will be at risk due to having venous insufficiency  This patient is unable to cut nails herself since the patient cannot reach her nails.These nails are painful walking and wearing shoes.  This patient presents for at risk foot care today.  General Appearance  Alert, conversant and in no acute stress.  Vascular  Dorsalis pedis and posterior tibial  pulses are palpable  bilaterally.  Capillary return is within normal limits  bilaterally. Temperature is within normal limits  bilaterally.  Neurologic  Senn-Weinstein monofilament wire test within normal limits  bilaterally. Muscle power within normal limits bilaterally.  Nails Thick disfigured discolored nails with subungual debris  from hallux to fifth toes bilaterally. No evidence of bacterial infection or drainage bilaterally.  Orthopedic  No limitations of motion  feet .  No crepitus or effusions noted.  HAV with hammer toes  B/L.  Skin  normotropic skin with no porokeratosis noted bilaterally.  No signs of infections or ulcers noted.     Onychomycosis  Pain in right toes  Pain in left toes  Consent was obtained for treatment procedures.   Mechanical debridement of nails 1-5  bilaterally performed with a nail nipper.  Filed with dremel without incident. No infection or ulcer.     Return office visit    12 weeks                Told patient to return for periodic foot care and evaluation due to potential at risk complications.   Avanthika Dehnert DPM  

## 2023-03-31 DIAGNOSIS — J3081 Allergic rhinitis due to animal (cat) (dog) hair and dander: Secondary | ICD-10-CM | POA: Diagnosis not present

## 2023-03-31 DIAGNOSIS — J3089 Other allergic rhinitis: Secondary | ICD-10-CM | POA: Diagnosis not present

## 2023-03-31 DIAGNOSIS — J301 Allergic rhinitis due to pollen: Secondary | ICD-10-CM | POA: Diagnosis not present

## 2023-04-14 DIAGNOSIS — J3089 Other allergic rhinitis: Secondary | ICD-10-CM | POA: Diagnosis not present

## 2023-04-14 DIAGNOSIS — J3081 Allergic rhinitis due to animal (cat) (dog) hair and dander: Secondary | ICD-10-CM | POA: Diagnosis not present

## 2023-04-14 DIAGNOSIS — J301 Allergic rhinitis due to pollen: Secondary | ICD-10-CM | POA: Diagnosis not present

## 2023-04-27 DIAGNOSIS — J3081 Allergic rhinitis due to animal (cat) (dog) hair and dander: Secondary | ICD-10-CM | POA: Diagnosis not present

## 2023-04-27 DIAGNOSIS — J301 Allergic rhinitis due to pollen: Secondary | ICD-10-CM | POA: Diagnosis not present

## 2023-04-27 DIAGNOSIS — J3089 Other allergic rhinitis: Secondary | ICD-10-CM | POA: Diagnosis not present

## 2023-05-03 DIAGNOSIS — H40023 Open angle with borderline findings, high risk, bilateral: Secondary | ICD-10-CM | POA: Diagnosis not present

## 2023-05-05 ENCOUNTER — Other Ambulatory Visit: Payer: Self-pay | Admitting: Nurse Practitioner

## 2023-05-05 DIAGNOSIS — J3089 Other allergic rhinitis: Secondary | ICD-10-CM | POA: Diagnosis not present

## 2023-05-05 DIAGNOSIS — J4540 Moderate persistent asthma, uncomplicated: Secondary | ICD-10-CM | POA: Diagnosis not present

## 2023-05-05 DIAGNOSIS — J3081 Allergic rhinitis due to animal (cat) (dog) hair and dander: Secondary | ICD-10-CM | POA: Diagnosis not present

## 2023-05-05 DIAGNOSIS — J301 Allergic rhinitis due to pollen: Secondary | ICD-10-CM | POA: Diagnosis not present

## 2023-05-12 DIAGNOSIS — J301 Allergic rhinitis due to pollen: Secondary | ICD-10-CM | POA: Diagnosis not present

## 2023-05-12 DIAGNOSIS — J3081 Allergic rhinitis due to animal (cat) (dog) hair and dander: Secondary | ICD-10-CM | POA: Diagnosis not present

## 2023-05-12 DIAGNOSIS — J3089 Other allergic rhinitis: Secondary | ICD-10-CM | POA: Diagnosis not present

## 2023-05-24 ENCOUNTER — Other Ambulatory Visit: Payer: Self-pay

## 2023-05-24 ENCOUNTER — Ambulatory Visit: Payer: Medicare Other | Attending: Obstetrics and Gynecology | Admitting: Physical Therapy

## 2023-05-24 DIAGNOSIS — R32 Unspecified urinary incontinence: Secondary | ICD-10-CM | POA: Insufficient documentation

## 2023-05-24 DIAGNOSIS — R279 Unspecified lack of coordination: Secondary | ICD-10-CM | POA: Diagnosis not present

## 2023-05-24 DIAGNOSIS — J301 Allergic rhinitis due to pollen: Secondary | ICD-10-CM | POA: Diagnosis not present

## 2023-05-24 DIAGNOSIS — M62838 Other muscle spasm: Secondary | ICD-10-CM | POA: Diagnosis not present

## 2023-05-24 DIAGNOSIS — R293 Abnormal posture: Secondary | ICD-10-CM | POA: Diagnosis not present

## 2023-05-24 DIAGNOSIS — J3089 Other allergic rhinitis: Secondary | ICD-10-CM | POA: Diagnosis not present

## 2023-05-24 DIAGNOSIS — M6281 Muscle weakness (generalized): Secondary | ICD-10-CM | POA: Insufficient documentation

## 2023-05-24 DIAGNOSIS — J3081 Allergic rhinitis due to animal (cat) (dog) hair and dander: Secondary | ICD-10-CM | POA: Diagnosis not present

## 2023-05-24 NOTE — Therapy (Signed)
OUTPATIENT PHYSICAL THERAPY FEMALE PELVIC EVALUATION   Patient Name: Wendy Figueroa MRN: [id number] DOB:[birth year], 77 y.o., female Today's Date: 05/24/2023  END OF SESSION:  PT End of Session - 05/24/23 1447     Visit Number 1    Date for PT Re-Evaluation 07/24/23    Authorization Type [employer] MCR    Progress Note Due on Visit 10    PT Start Time 1445    Activity Tolerance Patient tolerated treatment well    Behavior During Therapy [facility] for tasks assessed/performed             Past Medical History:  Diagnosis Date   Abnormal Pap smear    Over 6yrs ago   ALKALINE PHOSPHATASE, ELEVATED 07/01/2009   Qualifier: Diagnosis of  By: Kriste Basque MD, Lonzo Cloud    Allergic rhinitis    Anemia    Arthritis    Asthma    Cataract    bilateral implants   Cough    occasional; 9-21: reports as ongoing cough , managing with cough medicine and scheduled and prn inhalers, reports mucus is clear and scant     Cystocele 11/05/08   Diverticulosis of colon    DJD (degenerative joint disease)    no per pt   Elevated alkaline phosphatase level    GERD (gastroesophageal reflux disease)    H/O urinary incontinence 11/06/2008   H/O varicella    Hard measles    Hx of colonic polyps    Hypercholesterolemia    Hypertension    Leg swelling    occasional   LPRD (laryngopharyngeal reflux disease) 05/29/2015   Overweight(278.02)    Rectocele 11/06/2008   Large   Sebaceous cyst    on back   Sinusitis    Venous insufficiency    Wheezing    occasional   Yeast infection    Past Surgical History:  Procedure Laterality Date   ABDOMINAL HYSTERECTOMY  1988   ANTERIOR AND POSTERIOR REPAIR  11/05/08   ANTERIOR AND POSTERIOR REPAIR N/A 05/15/2019   Procedure: ANTERIOR (CYSTOCELE) AND POSTERIOR REPAIR (RECTOCELE);  Surgeon: Osborn Coho, MD;  Location: [facility];  Service: Gynecology;  Laterality: N/A;   APOGEE / PERIGEE REPAIR  10/2008   Dr. Lance Morin   BLADDER SUSPENSION N/A 05/15/2019    Procedure: TRANSVAGINAL TAPE (TVT) PROCEDURE;  Surgeon: Osborn Coho, MD;  Location: [facility];  Service: Gynecology;  Laterality: N/A;   bladder tack  2011   BREAST SURGERY  1978   cyst removed from both removed   cataracts   approx 10 years   Dr Ninetta Lights , ctaract with lens placement    COLONOSCOPY     CYSTOSCOPY N/A 05/15/2019   Procedure: CYSTOSCOPY;  Surgeon: Osborn Coho, MD;  Location: [facility];  Service: Gynecology;  Laterality: N/A;   ESOPHAGOGASTRODUODENOSCOPY     Patient Active Problem List   Diagnosis Date Noted   ILD (interstitial lung disease) (HCC) 07/13/2020   Abnormal cervical Papanicolaou smear 07/12/2019   Urinary urgency 07/12/2019   Urinary incontinence 05/15/2019   Cystocele with rectocele 05/15/2019   Pain due to onychomycosis of toenails of both feet 02/20/2019   Adjustment disorder with anxious mood 06/08/2018   History of pulmonary embolism 06/05/2018   Pharyngoesophageal dysphagia 04/18/2016   Alkaline phosphatase elevation 05/29/2015   Asthma, mild intermittent 05/29/2015   Other allergic rhinitis 05/29/2015   LPRD (laryngopharyngeal reflux disease) 05/29/2015   Chronic cough 05/29/2015   Dysphonia 05/29/2015  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   Globus sensation 04/02/2015   Osteopenia 07/08/2014   Hypokalemia 06/04/2014   Hyperlipidemia LDL goal <130 06/04/2014   DEGENERATIVE JOINT DISEASE 07/01/2009   Disorder of bone and cartilage 07/01/2009   INSOMNIA 08/11/2008   HYPERCHOLESTEROLEMIA, BORDERLINE 03/20/2008   Overweight 03/20/2008   Allergic rhinitis 03/20/2008   DIVERTICULOSIS OF COLON 03/20/2008   COLONIC POLYPS 09/27/2007   Essential hypertension 09/27/2007   Venous (peripheral) insufficiency 09/27/2007   Asthma 09/27/2007   GERD 09/27/2007    PCP: Abbey Chatters, NP  REFERRING PROVIDER: Osborn Coho, MD  REFERRING DIAG: R32 (ICD-10-CM) - Unspecified urinary  incontinence  THERAPY DIAG:  Muscle weakness (generalized)  Other muscle spasm  Unspecified lack of coordination  Abnormal posture  Rationale for Evaluation and Treatment: Rehabilitation  ONSET DATE: years  SUBJECTIVE:                                                                                                                                                                                           SUBJECTIVE STATEMENT: Pt reports she has been incontinence of urine and stool. Does have chronic cough that causes leakage of both. Coughing is what causes it the most. With lasix has urge incontinence with urine, not getting there quickly enough.   Fluid intake: Yes: water - 5-6 8oz cups; coffee on Mondays and Fridays; 1-2 sodas a week    PAIN:  Are you having pain? No NPRS scale: 0/10 Pain location:  na  Pain type:na Pain description: na  Aggravating factors: na Relieving factors: na  PRECAUTIONS: None  RED FLAGS: None   WEIGHT BEARING RESTRICTIONS: No  FALLS:  Has patient fallen in last 6 months? No  LIVING ENVIRONMENT: Lives with: lives with their family Lives in: House/apartment   OCCUPATION: retired   PLOF: Independent  PATIENT GOALS: to have less leakage   PERTINENT HISTORY:  Hysterectomy, rectocele and cystocele repair.  Sexual abuse: No  BOWEL MOVEMENT: Pain with bowel movement: No Type of bowel movement:Type (Bristol Stool Scale) 4, Frequency every other day, and Strain No Fully empty rectum: No not all the time Leakage: Yes: after having bowel movement  Pads: Yes: 1 change daily Fiber supplement: Yes: colace daily  URINATION: Pain with urination: No Fully empty bladder: Yes:   Stream: Strong and Weak Urgency: Yes:   Frequency: faster with lasix but without much longer  Leakage: Urge to void, Walking to the bathroom, Coughing, Sneezing, and Laughing and at night while sleeping thinks she leakage with turning over in bed but this doesn't  wake her. States in the morning with waking pad is usually saturated but  about once weekly is dry Pads: Yes: 1x daily and 1x with waking . Wears night time at night and lighter during day  INTERCOURSE: Pain with intercourse:  not active    PREGNANCY: Vaginal deliveries 2 Tearing Yes: episiotomy  C-section deliveries 0 Currently pregnant No  PROLAPSE: None   OBJECTIVE:  Note: Objective measures were completed at Evaluation unless otherwise noted.  DIAGNOSTIC FINDINGS:    COGNITION: Overall cognitive status: Within functional limits for tasks assessed     SENSATION: Light touch: Appears intact Proprioception: Appears intact  MUSCLE LENGTH: Bil hamstrings and adductors limited by 50%   GAIT: WFL  POSTURE: rounded shoulders, forward head, and posterior pelvic tilt  PELVIC ALIGNMENT:WFL  LUMBARAROM/PROM:  A/PROM A/PROM  eval  Flexion Limited by 50%  Extension Limited by 25%  Right lateral flexion Limited by 50%  Left lateral flexion Limited by 50%  Right rotation Limited by 50%  Left rotation Limited by 50%   (Blank rows = not tested)  LOWER EXTREMITY ROM: WFL  LOWER EXTREMITY MMT:  Bil hips grossly 3+/5; knees 5/5 PALPATION:   General  no TTP, tightness at lumbar spine and bil gluteals                External Perineal Exam WFL, no TTP                             Internal Pelvic Floor no TTP  Patient confirms identification and approves PT to assess internal pelvic floor and treatment Yes No emotional/communication barriers or cognitive limitation. Patient is motivated to learn. Patient understands and agrees with treatment goals and plan. PT explains patient will be examined in standing, sitting, and lying down to see how their muscles and joints work. When they are ready, they will be asked to remove their underwear so PT can examine their perineum. The patient is also given the option of providing their own chaperone as one is not provided in our  facility. The patient also has the right and is explained the right to defer or refuse any part of the evaluation or treatment including the internal exam. With the patient's consent, PT will use one gloved finger to gently assess the muscles of the pelvic floor, seeing how well it contracts and relaxes and if there is muscle symmetry. After, the patient will get dressed and PT and patient will discuss exam findings and plan of care. PT and patient discuss plan of care, schedule, attendance policy and HEP activities.   PELVIC MMT:   MMT eval  Vaginal 3/5 improved to 4/5 with cues for technique and breathing coordination; 1s; 4 reps  Internal Anal Sphincter   External Anal Sphincter   Puborectalis   Diastasis Recti   (Blank rows = not tested)        TONE: WFL  PROLAPSE: Not seen in hooklying with cough however limited visualization with body habitus   TODAY'S TREATMENT:  DATE:   05/24/23 EVAL Examination completed, findings reviewed, pt educated on POC, HEP. Pt motivated to participate in PT and agreeable to attempt recommendations.     PATIENT EDUCATION:  Education details: [id number] Person educated: Patient Education method: Explanation, Demonstration, Tactile cues, Verbal cues, and Handouts Education comprehension: verbalized understanding and returned demonstration  HOME EXERCISE PROGRAM: [id number]  ASSESSMENT:  CLINICAL IMPRESSION: Patient is a 77 y.o. female  who was seen today for physical therapy evaluation and treatment for urinary and fecal incontinent with urge and stress. Pt found to have decreased flexibility at spine and hips, decreased core and hip strength, tight lumbar paraspinals and gluteals. Patient consented to internal pelvic floor assessment vaginally this date and found to have decreased strength, endurance, and coordination. Patient  benefited from verbal cues for improved technique with pelvic floor contractions and coordination with breathing. Pt demonstrated breath holding consistently with all contraction attempts. With max cues able to improve but required extra time and reps and single step cues. Pt denied pain throughout exam. Pt would benefit from additional PT to further address deficits.    OBJECTIVE IMPAIRMENTS: decreased activity tolerance, decreased coordination, decreased endurance, decreased mobility, decreased strength, increased muscle spasms, impaired flexibility, improper body mechanics, and postural dysfunction.   ACTIVITY LIMITATIONS: carrying, lifting, squatting, and continence  PARTICIPATION LIMITATIONS: community activity  PERSONAL FACTORS: Time since onset of injury/illness/exacerbation and 1 comorbidity: medical history  are also affecting patient's functional outcome.   REHAB POTENTIAL: Good  CLINICAL DECISION MAKING: Stable/uncomplicated  EVALUATION COMPLEXITY: Low   GOALS: Goals reviewed with patient? Yes  SHORT TERM GOALS: Target date: 06/21/23  Pt to be I with HEP.  Baseline: Goal status: INITIAL  2.  Pt will have 25% less urgency due to bladder retraining and strengthening  Baseline:  Goal status: INITIAL  3.  Pt to demonstrate at least 4/5 pelvic floor strength with at least 5s isometric for improved pelvic stability and decreased strain at pelvic floor/ decrease leakage.  Baseline:  Goal status: INITIAL  4.  Pt to demonstrate improved coordination of pelvic floor and breathing mechanics with body weight squat with appropriate synergistic patterns to decrease pain and leakage at least 50% of the time.    Baseline:  Goal status: INITIAL   LONG TERM GOALS: Target date: 07/24/23  Pt to be I with advanced HEP.  Baseline:  Goal status: INITIAL  2.  Pt will have 50% less urgency due to bladder retraining and strengthening  Baseline:  Goal status: INITIAL  3.  Pt to  demonstrate at least 4/5 pelvic floor strength with 8s isometric for improved pelvic stability and decreased strain at pelvic floor/ decrease leakage.  Baseline:  Goal status: INITIAL  4.  Pt to demonstrate improved coordination of pelvic floor and breathing mechanics with 10# squat with appropriate synergistic patterns to decrease pain and leakage at least 75% of the time.    Baseline:  Goal status: INITIAL  5.  Pt to report no more than one instance or urinary leakage per day to decrease symptoms and improve QOL Baseline:  Goal status: INITIAL    PLAN:  PT FREQUENCY: 1x/week  PT DURATION:  8 sessions  PLANNED INTERVENTIONS: Therapeutic exercises, Therapeutic activity, Neuromuscular re-education, Patient/Family education, Self Care, Joint mobilization, Aquatic Therapy, Dry Needling, Spinal mobilization, Cryotherapy, Moist heat, scar mobilization, Taping, Biofeedback, Manual therapy, and Re-evaluation  PLAN FOR NEXT SESSION: coordination of pelvic floor and breathing with and without exercises, core and hip and pelvic floor strengthening, breathing mechanics,  pressure management, knack    Otelia Sergeant, PT, DPT 10/02/244:45 PM  [facility] [address] [place], [place] [address] Phone # [phone] Fax [phone]

## 2023-05-26 ENCOUNTER — Other Ambulatory Visit: Payer: Self-pay | Admitting: Nurse Practitioner

## 2023-05-26 DIAGNOSIS — K219 Gastro-esophageal reflux disease without esophagitis: Secondary | ICD-10-CM | POA: Diagnosis not present

## 2023-05-26 DIAGNOSIS — R053 Chronic cough: Secondary | ICD-10-CM | POA: Diagnosis not present

## 2023-05-29 ENCOUNTER — Ambulatory Visit: Payer: Medicare Other | Admitting: Physical Therapy

## 2023-05-29 DIAGNOSIS — M6281 Muscle weakness (generalized): Secondary | ICD-10-CM | POA: Diagnosis not present

## 2023-05-29 DIAGNOSIS — R293 Abnormal posture: Secondary | ICD-10-CM | POA: Diagnosis not present

## 2023-05-29 DIAGNOSIS — R32 Unspecified urinary incontinence: Secondary | ICD-10-CM | POA: Diagnosis not present

## 2023-05-29 DIAGNOSIS — R279 Unspecified lack of coordination: Secondary | ICD-10-CM | POA: Diagnosis not present

## 2023-05-29 DIAGNOSIS — M62838 Other muscle spasm: Secondary | ICD-10-CM | POA: Diagnosis not present

## 2023-05-29 NOTE — Therapy (Signed)
OUTPATIENT PHYSICAL THERAPY FEMALE PELVIC EVALUATION   Patient Name: Wendy Figueroa MRN: [id number] DOB:1946/03/02, 77 y.o., female Today's Date: 05/29/2023  END OF SESSION:  PT End of Session - 05/29/23 1235     Visit Number 2    Date for PT Re-Evaluation 07/24/23    Authorization Type [employer] MCR    Progress Note Due on Visit 10    PT Start Time 1232    PT Stop Time 1306   at pt request due to fatigue   PT Time Calculation (min) 34 min    Activity Tolerance Patient tolerated treatment well    Behavior During Therapy [facility] for tasks assessed/performed             Past Medical History:  Diagnosis Date   Abnormal Pap smear    Over 4yrs ago   ALKALINE PHOSPHATASE, ELEVATED 07/01/2009   Qualifier: Diagnosis of  By: Kriste Basque MD, Lonzo Cloud    Allergic rhinitis    Anemia    Arthritis    Asthma    Cataract    bilateral implants   Cough    occasional; 9-21: reports as ongoing cough , managing with cough medicine and scheduled and prn inhalers, reports mucus is clear and scant     Cystocele 11/05/08   Diverticulosis of colon    DJD (degenerative joint disease)    no per pt   Elevated alkaline phosphatase level    GERD (gastroesophageal reflux disease)    H/O urinary incontinence 11/06/2008   H/O varicella    Hard measles    Hx of colonic polyps    Hypercholesterolemia    Hypertension    Leg swelling    occasional   LPRD (laryngopharyngeal reflux disease) 05/29/2015   Overweight(278.02)    Rectocele 11/06/2008   Large   Sebaceous cyst    on back   Sinusitis    Venous insufficiency    Wheezing    occasional   Yeast infection    Past Surgical History:  Procedure Laterality Date   ABDOMINAL HYSTERECTOMY  1988   ANTERIOR AND POSTERIOR REPAIR  11/05/08   ANTERIOR AND POSTERIOR REPAIR N/A 05/15/2019   Procedure: ANTERIOR (CYSTOCELE) AND POSTERIOR REPAIR (RECTOCELE);  Surgeon: Osborn Coho, MD;  Location: [facility];  Service: Gynecology;  Laterality: N/A;    APOGEE / PERIGEE REPAIR  10/2008   Dr. Lance Morin   BLADDER SUSPENSION N/A 05/15/2019   Procedure: TRANSVAGINAL TAPE (TVT) PROCEDURE;  Surgeon: Osborn Coho, MD;  Location: [facility];  Service: Gynecology;  Laterality: N/A;   bladder tack  2011   BREAST SURGERY  1978   cyst removed from both removed   cataracts   approx 10 years   Dr Ninetta Lights , ctaract with lens placement    COLONOSCOPY     CYSTOSCOPY N/A 05/15/2019   Procedure: CYSTOSCOPY;  Surgeon: Osborn Coho, MD;  Location: [facility];  Service: Gynecology;  Laterality: N/A;   ESOPHAGOGASTRODUODENOSCOPY     Patient Active Problem List   Diagnosis Date Noted   ILD (interstitial lung disease) (HCC) 07/13/2020   Abnormal cervical Papanicolaou smear 07/12/2019   Urinary urgency 07/12/2019   Urinary incontinence 05/15/2019   Cystocele with rectocele 05/15/2019   Pain due to onychomycosis of toenails of both feet 02/20/2019   Adjustment disorder with anxious mood 06/08/2018   History of pulmonary embolism 06/05/2018   Pharyngoesophageal dysphagia 04/18/2016   Alkaline phosphatase elevation 05/29/2015   Asthma, mild intermittent 05/29/2015  Other allergic rhinitis 05/29/2015   LPRD (laryngopharyngeal reflux disease) 05/29/2015   Chronic cough 05/29/2015   Dysphonia 05/29/2015   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   Globus sensation 04/02/2015   Osteopenia 07/08/2014   Hypokalemia 06/04/2014   Hyperlipidemia LDL goal <130 06/04/2014   DEGENERATIVE JOINT DISEASE 07/01/2009   Disorder of bone and cartilage 07/01/2009   INSOMNIA 08/11/2008   HYPERCHOLESTEROLEMIA, BORDERLINE 03/20/2008   Overweight 03/20/2008   Allergic rhinitis 03/20/2008   DIVERTICULOSIS OF COLON 03/20/2008   COLONIC POLYPS 09/27/2007   Essential hypertension 09/27/2007   Venous (peripheral) insufficiency 09/27/2007   Asthma 09/27/2007   GERD 09/27/2007    PCP: Abbey Chatters, NP  REFERRING PROVIDER:  Osborn Coho, MD  REFERRING DIAG: R32 (ICD-10-CM) - Unspecified urinary incontinence  THERAPY DIAG:  Muscle weakness (generalized)  Other muscle spasm  Unspecified lack of coordination  Abnormal posture  Rationale for Evaluation and Treatment: Rehabilitation  ONSET DATE: years  SUBJECTIVE:                                                                                                                                                                                           SUBJECTIVE STATEMENT: I haven't been able to do what I wanted with my exercises, I have been busy with church this weekend. Does deny  increased leakage despite more activity but did not take lasix.   Fluid intake: Yes: water - 5-6 8oz cups; coffee on Mondays and Fridays; 1-2 sodas a week    PAIN:  Are you having pain? No NPRS scale: 0/10 Pain location:  na  Pain type:na Pain description: na  Aggravating factors: na Relieving factors: na  PRECAUTIONS: None  RED FLAGS: None   WEIGHT BEARING RESTRICTIONS: No  FALLS:  Has patient fallen in last 6 months? No  LIVING ENVIRONMENT: Lives with: lives with their family Lives in: House/apartment   OCCUPATION: retired   PLOF: Independent  PATIENT GOALS: to have less leakage   PERTINENT HISTORY:  Hysterectomy, rectocele and cystocele repair.  Sexual abuse: No  BOWEL MOVEMENT: Pain with bowel movement: No Type of bowel movement:Type (Bristol Stool Scale) 4, Frequency every other day, and Strain No Fully empty rectum: No not all the time Leakage: Yes: after having bowel movement  Pads: Yes: 1 change daily Fiber supplement: Yes: colace daily  URINATION: Pain with urination: No Fully empty bladder: Yes:   Stream: Strong and Weak Urgency: Yes:   Frequency: faster with lasix but without much longer  Leakage: Urge to void, Walking to the bathroom, Coughing, Sneezing, and Laughing and at night while sleeping thinks she leakage with turning  over in  bed but this doesn't wake her. States in the morning with waking pad is usually saturated but about once weekly is dry Pads: Yes: 1x daily and 1x with waking . Wears night time at night and lighter during day  INTERCOURSE: Pain with intercourse:  not active    PREGNANCY: Vaginal deliveries 2 Tearing Yes: episiotomy  C-section deliveries 0 Currently pregnant No  PROLAPSE: None   OBJECTIVE:  Note: Objective measures were completed at Evaluation unless otherwise noted.  DIAGNOSTIC FINDINGS:    COGNITION: Overall cognitive status: Within functional limits for tasks assessed     SENSATION: Light touch: Appears intact Proprioception: Appears intact  MUSCLE LENGTH: Bil hamstrings and adductors limited by 50%   GAIT: WFL  POSTURE: rounded shoulders, forward head, and posterior pelvic tilt  PELVIC ALIGNMENT:WFL  LUMBARAROM/PROM:  A/PROM A/PROM  eval  Flexion Limited by 50%  Extension Limited by 25%  Right lateral flexion Limited by 50%  Left lateral flexion Limited by 50%  Right rotation Limited by 50%  Left rotation Limited by 50%   (Blank rows = not tested)  LOWER EXTREMITY ROM: WFL  LOWER EXTREMITY MMT:  Bil hips grossly 3+/5; knees 5/5 PALPATION:   General  no TTP, tightness at lumbar spine and bil gluteals                External Perineal Exam WFL, no TTP                             Internal Pelvic Floor no TTP  Patient confirms identification and approves PT to assess internal pelvic floor and treatment Yes No emotional/communication barriers or cognitive limitation. Patient is motivated to learn. Patient understands and agrees with treatment goals and plan. PT explains patient will be examined in standing, sitting, and lying down to see how their muscles and joints work. When they are ready, they will be asked to remove their underwear so PT can examine their perineum. The patient is also given the option of providing their own chaperone as one  is not provided in our facility. The patient also has the right and is explained the right to defer or refuse any part of the evaluation or treatment including the internal exam. With the patient's consent, PT will use one gloved finger to gently assess the muscles of the pelvic floor, seeing how well it contracts and relaxes and if there is muscle symmetry. After, the patient will get dressed and PT and patient will discuss exam findings and plan of care. PT and patient discuss plan of care, schedule, attendance policy and HEP activities.   PELVIC MMT:   MMT eval  Vaginal 3/5 improved to 4/5 with cues for technique and breathing coordination; 1s; 4 reps  Internal Anal Sphincter   External Anal Sphincter   Puborectalis   Diastasis Recti   (Blank rows = not tested)        TONE: WFL  PROLAPSE: Not seen in hooklying with cough however limited visualization with body habitus   TODAY'S TREATMENT:  DATE:   05/24/23 EVAL Examination completed, findings reviewed, pt educated on POC, HEP. Pt motivated to participate in PT and agreeable to attempt recommendations.    05/29/23  NuStep 8 mins L4 - Pt present to review HEP  Supine x10 pelvic floor contractions Opp hand/knee ball press 2x10 in hooklying  2x10 Sit to stand from mat table with pelvic floor contraction into standing   PATIENT EDUCATION:  Education details: [id number] Person educated: Patient Education method: Explanation, Demonstration, Tactile cues, Verbal cues, and Handouts Education comprehension: verbalized understanding and returned demonstration  HOME EXERCISE PROGRAM: [id number]  ASSESSMENT:  CLINICAL IMPRESSION: Patient presents for treatment today, reports she is very fatigued after working all day at church yesterday. Pt did benefit fro rest breaks after all exercise due to fatigue. Pt able to  complete all exercises with good technique with cues but needed single step cues and one step tasks at a time, unable to complete coordination of pelvic floor and breathing but was able to do either pelvic floor contraction or breathing with task. Pt requested to end session slightly early due to fatigue. Pt would benefit from additional PT to further address deficits.    OBJECTIVE IMPAIRMENTS: decreased activity tolerance, decreased coordination, decreased endurance, decreased mobility, decreased strength, increased muscle spasms, impaired flexibility, improper body mechanics, and postural dysfunction.   ACTIVITY LIMITATIONS: carrying, lifting, squatting, and continence  PARTICIPATION LIMITATIONS: community activity  PERSONAL FACTORS: Time since onset of injury/illness/exacerbation and 1 comorbidity: medical history  are also affecting patient's functional outcome.   REHAB POTENTIAL: Good  CLINICAL DECISION MAKING: Stable/uncomplicated  EVALUATION COMPLEXITY: Low   GOALS: Goals reviewed with patient? Yes  SHORT TERM GOALS: Target date: 06/21/23  Pt to be I with HEP.  Baseline: Goal status: INITIAL  2.  Pt will have 25% less urgency due to bladder retraining and strengthening  Baseline:  Goal status: INITIAL  3.  Pt to demonstrate at least 4/5 pelvic floor strength with at least 5s isometric for improved pelvic stability and decreased strain at pelvic floor/ decrease leakage.  Baseline:  Goal status: INITIAL  4.  Pt to demonstrate improved coordination of pelvic floor and breathing mechanics with body weight squat with appropriate synergistic patterns to decrease pain and leakage at least 50% of the time.    Baseline:  Goal status: INITIAL   LONG TERM GOALS: Target date: 07/24/23  Pt to be I with advanced HEP.  Baseline:  Goal status: INITIAL  2.  Pt will have 50% less urgency due to bladder retraining and strengthening  Baseline:  Goal status: INITIAL  3.  Pt to  demonstrate at least 4/5 pelvic floor strength with 8s isometric for improved pelvic stability and decreased strain at pelvic floor/ decrease leakage.  Baseline:  Goal status: INITIAL  4.  Pt to demonstrate improved coordination of pelvic floor and breathing mechanics with 10# squat with appropriate synergistic patterns to decrease pain and leakage at least 75% of the time.    Baseline:  Goal status: INITIAL  5.  Pt to report no more than one instance or urinary leakage per day to decrease symptoms and improve QOL Baseline:  Goal status: INITIAL    PLAN:  PT FREQUENCY: 1x/week  PT DURATION:  8 sessions  PLANNED INTERVENTIONS: Therapeutic exercises, Therapeutic activity, Neuromuscular re-education, Patient/Family education, Self Care, Joint mobilization, Aquatic Therapy, Dry Needling, Spinal mobilization, Cryotherapy, Moist heat, scar mobilization, Taping, Biofeedback, Manual therapy, and Re-evaluation  PLAN FOR NEXT SESSION: coordination of pelvic floor and breathing  with and without exercises, core and hip and pelvic floor strengthening, breathing mechanics, pressure management, knack    Otelia Sergeant, PT, DPT 10/07/241:07 PM  [facility] [address] [place], [place] [address] Phone # [phone] Fax [phone]

## 2023-06-05 ENCOUNTER — Encounter: Payer: Self-pay | Admitting: Physical Therapy

## 2023-06-05 ENCOUNTER — Ambulatory Visit: Payer: Medicare Other | Admitting: Physical Therapy

## 2023-06-05 DIAGNOSIS — R32 Unspecified urinary incontinence: Secondary | ICD-10-CM | POA: Diagnosis not present

## 2023-06-05 DIAGNOSIS — R279 Unspecified lack of coordination: Secondary | ICD-10-CM | POA: Diagnosis not present

## 2023-06-05 DIAGNOSIS — R293 Abnormal posture: Secondary | ICD-10-CM | POA: Diagnosis not present

## 2023-06-05 DIAGNOSIS — M62838 Other muscle spasm: Secondary | ICD-10-CM

## 2023-06-05 DIAGNOSIS — M6281 Muscle weakness (generalized): Secondary | ICD-10-CM | POA: Diagnosis not present

## 2023-06-05 NOTE — Therapy (Signed)
OUTPATIENT PHYSICAL THERAPY FEMALE PELVIC EVALUATION   Patient Name: Wendy Figueroa MRN: [id number] DOB:1945/10/26, 77 y.o., female Today's Date: 06/05/2023  END OF SESSION:  PT End of Session - 06/05/23 1240     Visit Number 3    Date for PT Re-Evaluation 07/24/23    Authorization Type [employer] MCR    Progress Note Due on Visit 10    PT Start Time 1240   arrival time   PT Stop Time 1310    PT Time Calculation (min) 30 min    Activity Tolerance Patient tolerated treatment well    Behavior During Therapy WFL for tasks assessed/performed             Past Medical History:  Diagnosis Date   Abnormal Pap smear    Over 71yrs ago   ALKALINE PHOSPHATASE, ELEVATED 07/01/2009   Qualifier: Diagnosis of  By: Kriste Basque MD, Lonzo Cloud    Allergic rhinitis    Anemia    Arthritis    Asthma    Cataract    bilateral implants   Cough    occasional; 9-21: reports as ongoing cough , managing with cough medicine and scheduled and prn inhalers, reports mucus is clear and scant     Cystocele 11/05/08   Diverticulosis of colon    DJD (degenerative joint disease)    no per pt   Elevated alkaline phosphatase level    GERD (gastroesophageal reflux disease)    H/O urinary incontinence 11/06/2008   H/O varicella    Hard measles    Hx of colonic polyps    Hypercholesterolemia    Hypertension    Leg swelling    occasional   LPRD (laryngopharyngeal reflux disease) 05/29/2015   Overweight(278.02)    Rectocele 11/06/2008   Large   Sebaceous cyst    on back   Sinusitis    Venous insufficiency    Wheezing    occasional   Yeast infection    Past Surgical History:  Procedure Laterality Date   ABDOMINAL HYSTERECTOMY  1988   ANTERIOR AND POSTERIOR REPAIR  11/05/08   ANTERIOR AND POSTERIOR REPAIR N/A 05/15/2019   Procedure: ANTERIOR (CYSTOCELE) AND POSTERIOR REPAIR (RECTOCELE);  Surgeon: Osborn Coho, MD;  Location: [facility];  Service: Gynecology;  Laterality: N/A;   APOGEE /  PERIGEE REPAIR  10/2008   Dr. Lance Morin   BLADDER SUSPENSION N/A 05/15/2019   Procedure: TRANSVAGINAL TAPE (TVT) PROCEDURE;  Surgeon: Osborn Coho, MD;  Location: [facility];  Service: Gynecology;  Laterality: N/A;   bladder tack  2011   BREAST SURGERY  1978   cyst removed from both removed   cataracts   approx 10 years   Dr Ninetta Lights , ctaract with lens placement    COLONOSCOPY     CYSTOSCOPY N/A 05/15/2019   Procedure: CYSTOSCOPY;  Surgeon: Osborn Coho, MD;  Location: [facility];  Service: Gynecology;  Laterality: N/A;   ESOPHAGOGASTRODUODENOSCOPY     Patient Active Problem List   Diagnosis Date Noted   ILD (interstitial lung disease) (HCC) 07/13/2020   Abnormal cervical Papanicolaou smear 07/12/2019   Urinary urgency 07/12/2019   Urinary incontinence 05/15/2019   Cystocele with rectocele 05/15/2019   Pain due to onychomycosis of toenails of both feet 02/20/2019   Adjustment disorder with anxious mood 06/08/2018   History of pulmonary embolism 06/05/2018   Pharyngoesophageal dysphagia 04/18/2016   Alkaline phosphatase elevation 05/29/2015   Asthma, mild intermittent 05/29/2015   Other allergic rhinitis  05/29/2015   LPRD (laryngopharyngeal reflux disease) 05/29/2015   Chronic cough 05/29/2015   Dysphonia 05/29/2015   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   Globus sensation 04/02/2015   Osteopenia 07/08/2014   Hypokalemia 06/04/2014   Hyperlipidemia LDL goal <130 06/04/2014   DEGENERATIVE JOINT DISEASE 07/01/2009   Disorder of bone and cartilage 07/01/2009   INSOMNIA 08/11/2008   HYPERCHOLESTEROLEMIA, BORDERLINE 03/20/2008   Overweight 03/20/2008   Allergic rhinitis 03/20/2008   DIVERTICULOSIS OF COLON 03/20/2008   COLONIC POLYPS 09/27/2007   Essential hypertension 09/27/2007   Venous (peripheral) insufficiency 09/27/2007   Asthma 09/27/2007   GERD 09/27/2007    PCP: Abbey Chatters, NP  REFERRING PROVIDER: Osborn Coho, MD  REFERRING DIAG: R32 (ICD-10-CM) - Unspecified urinary incontinence  THERAPY DIAG:  Muscle weakness (generalized)  Other muscle spasm  Unspecified lack of coordination  Abnormal posture  Rationale for Evaluation and Treatment: Rehabilitation  ONSET DATE: years  SUBJECTIVE:                                                                                                                                                                                           SUBJECTIVE STATEMENT: I've been doing the exercises and I feel like the leakage is getting better. I have definitely noticed less leakage.  Fluid intake: Yes: water - 5-6 8oz cups; coffee on Mondays and Fridays; 1-2 sodas a week    PAIN:  Are you having pain? No NPRS scale: 0/10 Pain location:  na  Pain type:na Pain description: na  Aggravating factors: na Relieving factors: na  PRECAUTIONS: None  RED FLAGS: None   WEIGHT BEARING RESTRICTIONS: No  FALLS:  Has patient fallen in last 6 months? No  LIVING ENVIRONMENT: Lives with: lives with their family Lives in: House/apartment   OCCUPATION: retired   PLOF: Independent  PATIENT GOALS: to have less leakage   PERTINENT HISTORY:  Hysterectomy, rectocele and cystocele repair.  Sexual abuse: No  BOWEL MOVEMENT: Pain with bowel movement: No Type of bowel movement:Type (Bristol Stool Scale) 4, Frequency every other day, and Strain No Fully empty rectum: No not all the time Leakage: Yes: after having bowel movement  Pads: Yes: 1 change daily Fiber supplement: Yes: colace daily  URINATION: Pain with urination: No Fully empty bladder: Yes:   Stream: Strong and Weak Urgency: Yes:   Frequency: faster with lasix but without much longer  Leakage: Urge to void, Walking to the bathroom, Coughing, Sneezing, and Laughing and at night while sleeping thinks she leakage with turning over in bed but this doesn't wake her. States in the morning with waking  pad is usually saturated but  about once weekly is dry Pads: Yes: 1x daily and 1x with waking . Wears night time at night and lighter during day  INTERCOURSE: Pain with intercourse:  not active    PREGNANCY: Vaginal deliveries 2 Tearing Yes: episiotomy  C-section deliveries 0 Currently pregnant No  PROLAPSE: None   OBJECTIVE:  Note: Objective measures were completed at Evaluation unless otherwise noted.  DIAGNOSTIC FINDINGS:    COGNITION: Overall cognitive status: Within functional limits for tasks assessed     SENSATION: Light touch: Appears intact Proprioception: Appears intact  MUSCLE LENGTH: Bil hamstrings and adductors limited by 50%   GAIT: WFL  POSTURE: rounded shoulders, forward head, and posterior pelvic tilt  PELVIC ALIGNMENT:WFL  LUMBARAROM/PROM:  A/PROM A/PROM  eval  Flexion Limited by 50%  Extension Limited by 25%  Right lateral flexion Limited by 50%  Left lateral flexion Limited by 50%  Right rotation Limited by 50%  Left rotation Limited by 50%   (Blank rows = not tested)  LOWER EXTREMITY ROM: WFL  LOWER EXTREMITY MMT:  Bil hips grossly 3+/5; knees 5/5 PALPATION:   General  no TTP, tightness at lumbar spine and bil gluteals                External Perineal Exam WFL, no TTP                             Internal Pelvic Floor no TTP  Patient confirms identification and approves PT to assess internal pelvic floor and treatment Yes No emotional/communication barriers or cognitive limitation. Patient is motivated to learn. Patient understands and agrees with treatment goals and plan. PT explains patient will be examined in standing, sitting, and lying down to see how their muscles and joints work. When they are ready, they will be asked to remove their underwear so PT can examine their perineum. The patient is also given the option of providing their own chaperone as one is not provided in our facility. The patient also has the right and is  explained the right to defer or refuse any part of the evaluation or treatment including the internal exam. With the patient's consent, PT will use one gloved finger to gently assess the muscles of the pelvic floor, seeing how well it contracts and relaxes and if there is muscle symmetry. After, the patient will get dressed and PT and patient will discuss exam findings and plan of care. PT and patient discuss plan of care, schedule, attendance policy and HEP activities.   PELVIC MMT:   MMT eval  Vaginal 3/5 improved to 4/5 with cues for technique and breathing coordination; 1s; 4 reps  Internal Anal Sphincter   External Anal Sphincter   Puborectalis   Diastasis Recti   (Blank rows = not tested)        TONE: WFL  PROLAPSE: Not seen in hooklying with cough however limited visualization with body habitus   TODAY'S TREATMENT:  DATE:   05/24/23 EVAL Examination completed, findings reviewed, pt educated on POC, HEP. Pt motivated to participate in PT and agreeable to attempt recommendations.    05/29/23  NuStep 8 mins L4 - Pt present to review HEP  Supine x10 pelvic floor contractions Opp hand/knee ball press 2x10 in hooklying  2x10 Sit to stand from mat table with pelvic floor contraction into standing  06/05/23: NMRE: all exercises for exhale and pelvic floor contraction with activity for improved coordination of muscle activation and decreased stress at pelvic floor for decreased leakage.   Sitting  2x10 exhale and pelvic floor contraction - pt demonstrated great difficulty with this and benefited from single step hands on cues for improved technique  X10 Sit to stand from mat with pelvic floor contraction into standing X10 bridges exhale with activity  2x10 green band hip abduction +exhale with activity Standing hip abduction bil UE support x10 each +exhale with  activity     PATIENT EDUCATION:  Education details: [id number] Person educated: Patient Education method: Explanation, Demonstration, Tactile cues, Verbal cues, and Handouts Education comprehension: verbalized understanding and returned demonstration  HOME EXERCISE PROGRAM: [id number]  ASSESSMENT:  CLINICAL IMPRESSION: Patient presents for treatment today, focus on coordination of breathing and /or pelvic floor contraction with activity. Pt able to complete all exercises with good technique with cues but needed single step cues and one step tasks at a time, unable to complete coordination of pelvic floor and breathing but was able to do either pelvic floor contraction or breathing with task. Pt tolerated well today and reports improvement with symptoms. Pt would benefit from additional PT to further address deficits.    OBJECTIVE IMPAIRMENTS: decreased activity tolerance, decreased coordination, decreased endurance, decreased mobility, decreased strength, increased muscle spasms, impaired flexibility, improper body mechanics, and postural dysfunction.   ACTIVITY LIMITATIONS: carrying, lifting, squatting, and continence  PARTICIPATION LIMITATIONS: community activity  PERSONAL FACTORS: Time since onset of injury/illness/exacerbation and 1 comorbidity: medical history  are also affecting patient's functional outcome.   REHAB POTENTIAL: Good  CLINICAL DECISION MAKING: Stable/uncomplicated  EVALUATION COMPLEXITY: Low   GOALS: Goals reviewed with patient? Yes  SHORT TERM GOALS: Target date: 06/21/23  Pt to be I with HEP.  Baseline: Goal status: INITIAL  2.  Pt will have 25% less urgency due to bladder retraining and strengthening  Baseline:  Goal status: INITIAL  3.  Pt to demonstrate at least 4/5 pelvic floor strength with at least 5s isometric for improved pelvic stability and decreased strain at pelvic floor/ decrease leakage.  Baseline:  Goal status: INITIAL  4.  Pt to  demonstrate improved coordination of pelvic floor and breathing mechanics with body weight squat with appropriate synergistic patterns to decrease pain and leakage at least 50% of the time.    Baseline:  Goal status: INITIAL   LONG TERM GOALS: Target date: 07/24/23  Pt to be I with advanced HEP.  Baseline:  Goal status: INITIAL  2.  Pt will have 50% less urgency due to bladder retraining and strengthening  Baseline:  Goal status: INITIAL  3.  Pt to demonstrate at least 4/5 pelvic floor strength with 8s isometric for improved pelvic stability and decreased strain at pelvic floor/ decrease leakage.  Baseline:  Goal status: INITIAL  4.  Pt to demonstrate improved coordination of pelvic floor and breathing mechanics with 10# squat with appropriate synergistic patterns to decrease pain and leakage at least 75% of the time.    Baseline:  Goal status: INITIAL  5.  Pt to report no more than one instance or urinary leakage per day to decrease symptoms and improve QOL Baseline:  Goal status: INITIAL    PLAN:  PT FREQUENCY: 1x/week  PT DURATION:  8 sessions  PLANNED INTERVENTIONS: Therapeutic exercises, Therapeutic activity, Neuromuscular re-education, Patient/Family education, Self Care, Joint mobilization, Aquatic Therapy, Dry Needling, Spinal mobilization, Cryotherapy, Moist heat, scar mobilization, Taping, Biofeedback, Manual therapy, and Re-evaluation  PLAN FOR NEXT SESSION: coordination of pelvic floor and breathing with and without exercises, core and hip and pelvic floor strengthening, breathing mechanics, pressure management, knack    Otelia Sergeant, PT, DPT 10/14/241:11 PM  [facility] [address], [address] [place], [place] [address] Phone # [phone] Fax [phone]

## 2023-06-12 ENCOUNTER — Ambulatory Visit: Payer: Medicare Other | Admitting: Physical Therapy

## 2023-06-12 ENCOUNTER — Encounter: Payer: Self-pay | Admitting: Physical Therapy

## 2023-06-12 DIAGNOSIS — R279 Unspecified lack of coordination: Secondary | ICD-10-CM

## 2023-06-12 DIAGNOSIS — J3089 Other allergic rhinitis: Secondary | ICD-10-CM | POA: Diagnosis not present

## 2023-06-12 DIAGNOSIS — M62838 Other muscle spasm: Secondary | ICD-10-CM

## 2023-06-12 DIAGNOSIS — R293 Abnormal posture: Secondary | ICD-10-CM

## 2023-06-12 DIAGNOSIS — J301 Allergic rhinitis due to pollen: Secondary | ICD-10-CM | POA: Diagnosis not present

## 2023-06-12 DIAGNOSIS — M6281 Muscle weakness (generalized): Secondary | ICD-10-CM

## 2023-06-12 DIAGNOSIS — R32 Unspecified urinary incontinence: Secondary | ICD-10-CM | POA: Diagnosis not present

## 2023-06-12 DIAGNOSIS — J3081 Allergic rhinitis due to animal (cat) (dog) hair and dander: Secondary | ICD-10-CM | POA: Diagnosis not present

## 2023-06-12 NOTE — Therapy (Signed)
OUTPATIENT PHYSICAL THERAPY FEMALE PELVIC TREATMENT   Patient Name: Wendy Figueroa MRN: [id number] DOB:05/01/1946, 77 y.o., female Today's Date: 06/12/2023  END OF SESSION:  PT End of Session - 06/12/23 1233     Visit Number 4    Date for PT Re-Evaluation 07/24/23    Authorization Type [employer] MCR    Authorization Time Period [employer] Approved 8 -05/24/2023-07/16/2023 UUVO#[id number]    Authorization - Visit Number 3    Authorization - Number of Visits 8    Progress Note Due on Visit 10    PT Start Time 1230    PT Stop Time 1309    PT Time Calculation (min) 39 min    Activity Tolerance Patient tolerated treatment well    Behavior During Therapy WFL for tasks assessed/performed             Past Medical History:  Diagnosis Date   Abnormal Pap smear    Over 40yrs ago   ALKALINE PHOSPHATASE, ELEVATED 07/01/2009   Qualifier: Diagnosis of  By: Kriste Basque MD, Lonzo Cloud    Allergic rhinitis    Anemia    Arthritis    Asthma    Cataract    bilateral implants   Cough    occasional; 9-21: reports as ongoing cough , managing with cough medicine and scheduled and prn inhalers, reports mucus is clear and scant     Cystocele 11/05/08   Diverticulosis of colon    DJD (degenerative joint disease)    no per pt   Elevated alkaline phosphatase level    GERD (gastroesophageal reflux disease)    H/O urinary incontinence 11/06/2008   H/O varicella    Hard measles    Hx of colonic polyps    Hypercholesterolemia    Hypertension    Leg swelling    occasional   LPRD (laryngopharyngeal reflux disease) 05/29/2015   Overweight(278.02)    Rectocele 11/06/2008   Large   Sebaceous cyst    on back   Sinusitis    Venous insufficiency    Wheezing    occasional   Yeast infection    Past Surgical History:  Procedure Laterality Date   ABDOMINAL HYSTERECTOMY  1988   ANTERIOR AND POSTERIOR REPAIR  11/05/08   ANTERIOR AND POSTERIOR REPAIR N/A 05/15/2019   Procedure: ANTERIOR (CYSTOCELE) AND POSTERIOR  REPAIR (RECTOCELE);  Surgeon: Osborn Coho, MD;  Location[facility];  Service: Gynecology;  Laterality: N/A;   APOGEE / PERIGEE REPAIR  10/2008   Dr. Lance Morin   BLADDER SUSPENSION N/A 05/15/2019   Procedure: TRANSVAGINAL TAPE (TVT) PROCEDURE;  Surgeon: Osborn Coho, MD;  Location: [facility];  Service: Gynecology;  Laterality: N/A;   bladder tack  2011   BREAST SURGERY  1978   cyst removed from both removed   cataracts   approx 10 years   Dr Ninetta Lights , ctaract with lens placement    COLONOSCOPY     CYSTOSCOPY N/A 05/15/2019   Procedure: CYSTOSCOPY;  Surgeon: Osborn Coho, MD;  Location: [facility];  Service: Gynecology;  Laterality: N/A;   ESOPHAGOGASTRODUODENOSCOPY     Patient Active Problem List   Diagnosis Date Noted   ILD (interstitial lung disease) (HCC) 07/13/2020   Abnormal cervical Papanicolaou smear 07/12/2019   Urinary urgency 07/12/2019   Urinary incontinence 05/15/2019   Cystocele with rectocele 05/15/2019   Pain due to onychomycosis of toenails of both feet 02/20/2019   Adjustment disorder with anxious mood 06/08/2018   History of  pulmonary embolism 06/05/2018   Pharyngoesophageal dysphagia 04/18/2016   Alkaline phosphatase elevation 05/29/2015   Asthma, mild intermittent 05/29/2015   Other allergic rhinitis 05/29/2015   LPRD (laryngopharyngeal reflux disease) 05/29/2015   Chronic cough 05/29/2015   Dysphonia 05/29/2015   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   Globus sensation 04/02/2015   Osteopenia 07/08/2014   Hypokalemia 06/04/2014   Hyperlipidemia LDL goal <130 06/04/2014   DEGENERATIVE JOINT DISEASE 07/01/2009   Disorder of bone and cartilage 07/01/2009   INSOMNIA 08/11/2008   HYPERCHOLESTEROLEMIA, BORDERLINE 03/20/2008   Overweight 03/20/2008   Allergic rhinitis 03/20/2008   DIVERTICULOSIS OF COLON 03/20/2008   COLONIC POLYPS 09/27/2007   Essential hypertension 09/27/2007   Venous  (peripheral) insufficiency 09/27/2007   Asthma 09/27/2007   GERD 09/27/2007    PCP: Abbey Chatters, NP  REFERRING PROVIDER: Osborn Coho, MD  REFERRING DIAG: R32 (ICD-10-CM) - Unspecified urinary incontinence  THERAPY DIAG:  Muscle weakness (generalized)  Other muscle spasm  Unspecified lack of coordination  Abnormal posture  Rationale for Evaluation and Treatment: Rehabilitation  ONSET DATE: years  SUBJECTIVE:                                                                                                                                                                                           SUBJECTIVE STATEMENT: Pt reports one day of having more night time leakage but reports she was drinking more up until bed time that night.   Fluid intake: Yes: water - 5-6 8oz cups; coffee on Mondays and Fridays; 1-2 sodas a week    PAIN:  Are you having pain? No NPRS scale: 0/10 Pain location:  na  Pain type:na Pain description: na  Aggravating factors: na Relieving factors: na  PRECAUTIONS: None  RED FLAGS: None   WEIGHT BEARING RESTRICTIONS: No  FALLS:  Has patient fallen in last 6 months? No  LIVING ENVIRONMENT: Lives with: lives with their family Lives in: House/apartment   OCCUPATION: retired   PLOF: Independent  PATIENT GOALS: to have less leakage   PERTINENT HISTORY:  Hysterectomy, rectocele and cystocele repair.  Sexual abuse: No  BOWEL MOVEMENT: Pain with bowel movement: No Type of bowel movement:Type (Bristol Stool Scale) 4, Frequency every other day, and Strain No Fully empty rectum: No not all the time Leakage: Yes: after having bowel movement  Pads: Yes: 1 change daily Fiber supplement: Yes: colace daily  URINATION: Pain with urination: No Fully empty bladder: Yes:   Stream: Strong and Weak Urgency: Yes:   Frequency: faster with lasix but without much longer  Leakage: Urge to void, Walking to the bathroom, Coughing, Sneezing, and  Laughing and  at night while sleeping thinks she leakage with turning over in bed but this doesn't wake her. States in the morning with waking pad is usually saturated but about once weekly is dry Pads: Yes: 1x daily and 1x with waking . Wears night time at night and lighter during day  INTERCOURSE: Pain with intercourse:  not active    PREGNANCY: Vaginal deliveries 2 Tearing Yes: episiotomy  C-section deliveries 0 Currently pregnant No  PROLAPSE: None   OBJECTIVE:  Note: Objective measures were completed at Evaluation unless otherwise noted.  DIAGNOSTIC FINDINGS:    COGNITION: Overall cognitive status: Within functional limits for tasks assessed     SENSATION: Light touch: Appears intact Proprioception: Appears intact  MUSCLE LENGTH: Bil hamstrings and adductors limited by 50%   GAIT: WFL  POSTURE: rounded shoulders, forward head, and posterior pelvic tilt  PELVIC ALIGNMENT:WFL  LUMBARAROM/PROM:  A/PROM A/PROM  eval  Flexion Limited by 50%  Extension Limited by 25%  Right lateral flexion Limited by 50%  Left lateral flexion Limited by 50%  Right rotation Limited by 50%  Left rotation Limited by 50%   (Blank rows = not tested)  LOWER EXTREMITY ROM: WFL  LOWER EXTREMITY MMT:  Bil hips grossly 3+/5; knees 5/5 PALPATION:   General  no TTP, tightness at lumbar spine and bil gluteals                External Perineal Exam WFL, no TTP                             Internal Pelvic Floor no TTP  Patient confirms identification and approves PT to assess internal pelvic floor and treatment Yes No emotional/communication barriers or cognitive limitation. Patient is motivated to learn. Patient understands and agrees with treatment goals and plan. PT explains patient will be examined in standing, sitting, and lying down to see how their muscles and joints work. When they are ready, they will be asked to remove their underwear so PT can examine their perineum. The  patient is also given the option of providing their own chaperone as one is not provided in our facility. The patient also has the right and is explained the right to defer or refuse any part of the evaluation or treatment including the internal exam. With the patient's consent, PT will use one gloved finger to gently assess the muscles of the pelvic floor, seeing how well it contracts and relaxes and if there is muscle symmetry. After, the patient will get dressed and PT and patient will discuss exam findings and plan of care. PT and patient discuss plan of care, schedule, attendance policy and HEP activities.   PELVIC MMT:   MMT eval  Vaginal 3/5 improved to 4/5 with cues for technique and breathing coordination; 1s; 4 reps  Internal Anal Sphincter   External Anal Sphincter   Puborectalis   Diastasis Recti   (Blank rows = not tested)        TONE: WFL  PROLAPSE: Not seen in hooklying with cough however limited visualization with body habitus   TODAY'S TREATMENT:  DATE:   05/29/23  NuStep 8 mins L4 - Pt present to review HEP  Supine x10 pelvic floor contractions Opp hand/knee ball press 2x10 in hooklying  2x10 Sit to stand from mat table with pelvic floor contraction into standing  06/05/23: NMRE: all exercises for exhale and pelvic floor contraction with activity for improved coordination of muscle activation and decreased stress at pelvic floor for decreased leakage.   Sitting  2x10 exhale and pelvic floor contraction - pt demonstrated great difficulty with this and benefited from single step hands on cues for improved technique  X10 Sit to stand from mat with pelvic floor contraction into standing X10 bridges exhale with activity  2x10 green band hip abduction +exhale with activity Standing hip abduction bil UE support x10 each +exhale with  activity  06/12/23: NuStep 8 mins L4 PT present to Educate on urge drill - denied questions, given handout  NMRE: all exercises for exhale and pelvic floor contraction with activity for improved coordination of muscle activation and decreased stress at pelvic floor for decreased leakage.   Bridges 2x10 2x10 opp hand/knee ball press hooklying  Ball squeezes 2x10  2x10 Sit to stands    PATIENT EDUCATION:  Education details: [id number] Person educated: Patient Education method: Explanation, Demonstration, Tactile cues, Verbal cues, and Handouts Education comprehension: verbalized understanding and returned demonstration  HOME EXERCISE PROGRAM: [id number]  ASSESSMENT:  CLINICAL IMPRESSION: Patient presents for treatment today, focus on coordination of breathing and /or pelvic floor contraction with activity. Pt tolerated well today and reports continued improvement with symptoms. Does continue to benefit from moderate cues verbally for improved coordination and activation of pelvic floor and decreased strain at pelvic floor with activity. Pt would benefit from additional PT to further address deficits.    OBJECTIVE IMPAIRMENTS: decreased activity tolerance, decreased coordination, decreased endurance, decreased mobility, decreased strength, increased muscle spasms, impaired flexibility, improper body mechanics, and postural dysfunction.   ACTIVITY LIMITATIONS: carrying, lifting, squatting, and continence  PARTICIPATION LIMITATIONS: community activity  PERSONAL FACTORS: Time since onset of injury/illness/exacerbation and 1 comorbidity: medical history  are also affecting patient's functional outcome.   REHAB POTENTIAL: Good  CLINICAL DECISION MAKING: Stable/uncomplicated  EVALUATION COMPLEXITY: Low   GOALS: Goals reviewed with patient? Yes  SHORT TERM GOALS: Target date: 06/21/23  Pt to be I with HEP.  Baseline: Goal status: INITIAL  2.  Pt will have 25% less urgency due to  bladder retraining and strengthening  Baseline:  Goal status: INITIAL  3.  Pt to demonstrate at least 4/5 pelvic floor strength with at least 5s isometric for improved pelvic stability and decreased strain at pelvic floor/ decrease leakage.  Baseline:  Goal status: INITIAL  4.  Pt to demonstrate improved coordination of pelvic floor and breathing mechanics with body weight squat with appropriate synergistic patterns to decrease pain and leakage at least 50% of the time.    Baseline:  Goal status: INITIAL   LONG TERM GOALS: Target date: 07/24/23  Pt to be I with advanced HEP.  Baseline:  Goal status: INITIAL  2.  Pt will have 50% less urgency due to bladder retraining and strengthening  Baseline:  Goal status: INITIAL  3.  Pt to demonstrate at least 4/5 pelvic floor strength with 8s isometric for improved pelvic stability and decreased strain at pelvic floor/ decrease leakage.  Baseline:  Goal status: INITIAL  4.  Pt to demonstrate improved coordination of pelvic floor and breathing mechanics with 10# squat with appropriate synergistic patterns to decrease  pain and leakage at least 75% of the time.    Baseline:  Goal status: INITIAL  5.  Pt to report no more than one instance or urinary leakage per day to decrease symptoms and improve QOL Baseline:  Goal status: INITIAL    PLAN:  PT FREQUENCY: 1x/week  PT DURATION:  8 sessions  PLANNED INTERVENTIONS: Therapeutic exercises, Therapeutic activity, Neuromuscular re-education, Patient/Family education, Self Care, Joint mobilization, Aquatic Therapy, Dry Needling, Spinal mobilization, Cryotherapy, Moist heat, scar mobilization, Taping, Biofeedback, Manual therapy, and Re-evaluation  PLAN FOR NEXT SESSION: coordination of pelvic floor and breathing with and without exercises, core and hip and pelvic floor strengthening, breathing mechanics, pressure management, knack    Otelia Sergeant, PT, DPT 10/21/242:57 PM  [facility] [address], [address] [place], [place] [address] Phone # [phone] Fax [phone]

## 2023-06-12 NOTE — Patient Instructions (Signed)

## 2023-06-19 ENCOUNTER — Ambulatory Visit: Payer: Medicare Other | Admitting: Physical Therapy

## 2023-06-19 DIAGNOSIS — R293 Abnormal posture: Secondary | ICD-10-CM | POA: Diagnosis not present

## 2023-06-19 DIAGNOSIS — M62838 Other muscle spasm: Secondary | ICD-10-CM | POA: Diagnosis not present

## 2023-06-19 DIAGNOSIS — R32 Unspecified urinary incontinence: Secondary | ICD-10-CM | POA: Diagnosis not present

## 2023-06-19 DIAGNOSIS — M6281 Muscle weakness (generalized): Secondary | ICD-10-CM

## 2023-06-19 DIAGNOSIS — R279 Unspecified lack of coordination: Secondary | ICD-10-CM

## 2023-06-19 NOTE — Therapy (Signed)
OUTPATIENT PHYSICAL THERAPY FEMALE PELVIC TREATMENT   Patient Name: Wendy Figueroa MRN: [id number] DOB:01-May-1946, 77 y.o., female Today's Date: 06/19/2023  END OF SESSION:  PT End of Session - 06/19/23 1450     Visit Number 5    Date for PT Re-Evaluation 07/24/23    Authorization Type [employer] MCR    Authorization Time Period [employer] Approved 8 -05/24/2023-07/16/2023 GUYQ#[id number]    Authorization - Visit Number 4    Authorization - Number of Visits 8    Progress Note Due on Visit 10    PT Start Time 1447    PT Stop Time 1528    PT Time Calculation (min) 41 min    Activity Tolerance Patient tolerated treatment well    Behavior During Therapy WFL for tasks assessed/performed             Past Medical History:  Diagnosis Date   Abnormal Pap smear    Over 39yrs ago   ALKALINE PHOSPHATASE, ELEVATED 07/01/2009   Qualifier: Diagnosis of  By: Kriste Basque MD, Lonzo Cloud    Allergic rhinitis    Anemia    Arthritis    Asthma    Cataract    bilateral implants   Cough    occasional; 9-21: reports as ongoing cough , managing with cough medicine and scheduled and prn inhalers, reports mucus is clear and scant     Cystocele 11/05/08   Diverticulosis of colon    DJD (degenerative joint disease)    no per pt   Elevated alkaline phosphatase level    GERD (gastroesophageal reflux disease)    H/O urinary incontinence 11/06/2008   H/O varicella    Hard measles    Hx of colonic polyps    Hypercholesterolemia    Hypertension    Leg swelling    occasional   LPRD (laryngopharyngeal reflux disease) 05/29/2015   Overweight(278.02)    Rectocele 11/06/2008   Large   Sebaceous cyst    on back   Sinusitis    Venous insufficiency    Wheezing    occasional   Yeast infection    Past Surgical History:  Procedure Laterality Date   ABDOMINAL HYSTERECTOMY  1988   ANTERIOR AND POSTERIOR REPAIR  11/05/08   ANTERIOR AND POSTERIOR REPAIR N/A 05/15/2019   Procedure: ANTERIOR (CYSTOCELE) AND POSTERIOR  REPAIR (RECTOCELE);  Surgeon: Osborn Coho, MD;  Location: [facility];  Service: Gynecology;  Laterality: N/A;   APOGEE / PERIGEE REPAIR  10/2008   Dr. Lance Morin   BLADDER SUSPENSION N/A 05/15/2019   Procedure: TRANSVAGINAL TAPE (TVT) PROCEDURE;  Surgeon: Osborn Coho, MD;  Location: [facility];  Service: Gynecology;  Laterality: N/A;   bladder tack  2011   BREAST SURGERY  1978   cyst removed from both removed   cataracts   approx 10 years   Dr Ninetta Lights , ctaract with lens placement    COLONOSCOPY     CYSTOSCOPY N/A 05/15/2019   Procedure: CYSTOSCOPY;  Surgeon: Osborn Coho, MD;  Location: [facility];  Service: Gynecology;  Laterality: N/A;   ESOPHAGOGASTRODUODENOSCOPY     Patient Active Problem List   Diagnosis Date Noted   ILD (interstitial lung disease) (HCC) 07/13/2020   Abnormal cervical Papanicolaou smear 07/12/2019   Urinary urgency 07/12/2019   Urinary incontinence 05/15/2019   Cystocele with rectocele 05/15/2019   Pain due to onychomycosis of toenails of both feet 02/20/2019   Adjustment disorder with anxious mood 06/08/2018   History of  pulmonary embolism 06/05/2018   Pharyngoesophageal dysphagia 04/18/2016   Alkaline phosphatase elevation 05/29/2015   Asthma, mild intermittent 05/29/2015   Other allergic rhinitis 05/29/2015   LPRD (laryngopharyngeal reflux disease) 05/29/2015   Chronic cough 05/29/2015   Dysphonia 05/29/2015   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   Globus sensation 04/02/2015   Osteopenia 07/08/2014   Hypokalemia 06/04/2014   Hyperlipidemia LDL goal <130 06/04/2014   DEGENERATIVE JOINT DISEASE 07/01/2009   Disorder of bone and cartilage 07/01/2009   INSOMNIA 08/11/2008   HYPERCHOLESTEROLEMIA, BORDERLINE 03/20/2008   Overweight 03/20/2008   Allergic rhinitis 03/20/2008   DIVERTICULOSIS OF COLON 03/20/2008   COLONIC POLYPS 09/27/2007   Essential hypertension 09/27/2007   Venous  (peripheral) insufficiency 09/27/2007   Asthma 09/27/2007   GERD 09/27/2007    PCP: Abbey Chatters, NP  REFERRING PROVIDER: Osborn Coho, MD  REFERRING DIAG: R32 (ICD-10-CM) - Unspecified urinary incontinence  THERAPY DIAG:  Muscle weakness (generalized)  Other muscle spasm  Unspecified lack of coordination  Rationale for Evaluation and Treatment: Rehabilitation  ONSET DATE: years  SUBJECTIVE:                                                                                                                                                                                           SUBJECTIVE STATEMENT: Pt reports "I feel like leakage is getting better. I have really noticed a difference during the day. Urge drill helped me make it to the bathroom without an accident." Pt states if she Wakes up with urges and no leakage, but if sleeping throughout the night will have dampness on pad. Also reports she has been having much less to no fecal leakage as well.   Fluid intake: Yes: water - 5-6 8oz cups; coffee on Mondays and Fridays; 1-2 sodas a week    PAIN:  Are you having pain? No NPRS scale: 0/10 Pain location:  na  Pain type:na Pain description: na  Aggravating factors: na Relieving factors: na  PRECAUTIONS: None  RED FLAGS: None   WEIGHT BEARING RESTRICTIONS: No  FALLS:  Has patient fallen in last 6 months? No  LIVING ENVIRONMENT: Lives with: lives with their family Lives in: House/apartment   OCCUPATION: retired   PLOF: Independent  PATIENT GOALS: to have less leakage   PERTINENT HISTORY:  Hysterectomy, rectocele and cystocele repair.  Sexual abuse: No  BOWEL MOVEMENT: Pain with bowel movement: No Type of bowel movement:Type (Bristol Stool Scale) 4, Frequency every other day, and Strain No Fully empty rectum: No not all the time Leakage: Yes: after having bowel movement  Pads: Yes: 1 change daily Fiber supplement: Yes: colace  daily  URINATION: Pain with urination: No Fully empty bladder: Yes:   Stream: Strong and Weak Urgency: Yes:   Frequency: faster with lasix but without much longer  Leakage: Urge to void, Walking to the bathroom, Coughing, Sneezing, and Laughing and at night while sleeping thinks she leakage with turning over in bed but this doesn't wake her. States in the morning with waking pad is usually saturated but about once weekly is dry Pads: Yes: 1x daily and 1x with waking . Wears night time at night and lighter during day  INTERCOURSE: Pain with intercourse:  not active    PREGNANCY: Vaginal deliveries 2 Tearing Yes: episiotomy  C-section deliveries 0 Currently pregnant No  PROLAPSE: None   OBJECTIVE:  Note: Objective measures were completed at Evaluation unless otherwise noted.  DIAGNOSTIC FINDINGS:    COGNITION: Overall cognitive status: Within functional limits for tasks assessed     SENSATION: Light touch: Appears intact Proprioception: Appears intact  MUSCLE LENGTH: Bil hamstrings and adductors limited by 50%   GAIT: WFL  POSTURE: rounded shoulders, forward head, and posterior pelvic tilt  PELVIC ALIGNMENT:WFL  LUMBARAROM/PROM:  A/PROM A/PROM  eval  Flexion Limited by 50%  Extension Limited by 25%  Right lateral flexion Limited by 50%  Left lateral flexion Limited by 50%  Right rotation Limited by 50%  Left rotation Limited by 50%   (Blank rows = not tested)  LOWER EXTREMITY ROM: WFL  LOWER EXTREMITY MMT:  Bil hips grossly 3+/5; knees 5/5 PALPATION:   General  no TTP, tightness at lumbar spine and bil gluteals                External Perineal Exam WFL, no TTP                             Internal Pelvic Floor no TTP  Patient confirms identification and approves PT to assess internal pelvic floor and treatment Yes No emotional/communication barriers or cognitive limitation. Patient is motivated to learn. Patient understands and agrees with  treatment goals and plan. PT explains patient will be examined in standing, sitting, and lying down to see how their muscles and joints work. When they are ready, they will be asked to remove their underwear so PT can examine their perineum. The patient is also given the option of providing their own chaperone as one is not provided in our facility. The patient also has the right and is explained the right to defer or refuse any part of the evaluation or treatment including the internal exam. With the patient's consent, PT will use one gloved finger to gently assess the muscles of the pelvic floor, seeing how well it contracts and relaxes and if there is muscle symmetry. After, the patient will get dressed and PT and patient will discuss exam findings and plan of care. PT and patient discuss plan of care, schedule, attendance policy and HEP activities.   PELVIC MMT:   MMT eval  Vaginal 3/5 improved to 4/5 with cues for technique and breathing coordination; 1s; 4 reps  Internal Anal Sphincter   External Anal Sphincter   Puborectalis   Diastasis Recti   (Blank rows = not tested)        TONE: WFL  PROLAPSE: Not seen in hooklying with cough however limited visualization with body habitus   TODAY'S TREATMENT:  DATE:   06/05/23: NMRE: all exercises for exhale and pelvic floor contraction with activity for improved coordination of muscle activation and decreased stress at pelvic floor for decreased leakage.   Sitting  2x10 exhale and pelvic floor contraction - pt demonstrated great difficulty with this and benefited from single step hands on cues for improved technique  X10 Sit to stand from mat with pelvic floor contraction into standing X10 bridges exhale with activity  2x10 green band hip abduction +exhale with activity Standing hip abduction bil UE support x10 each  +exhale with activity  06/12/23: NuStep 8 mins L4 PT present to Educate on urge drill - denied questions, given handout  NMRE: all exercises for exhale and pelvic floor contraction with activity for improved coordination of muscle activation and decreased stress at pelvic floor for decreased leakage.   Bridges 2x10 2x10 opp hand/knee ball press hooklying  Ball squeezes 2x10  2x10 Sit to stands  06/19/23:  NuStep 8 mins L5 PT present to discuss progress NMRE: all exercises for exhale and pelvic floor contraction with activity for improved coordination of muscle activation and decreased stress at pelvic floor for decreased leakage.   Bridges 2x10 Red band hip abductions 2x10 hooklying  Hooklying marching red band 2x10 2x10 Sit to stand 7# 150' 7#+5# and 59' 5# farmers carry  PATIENT EDUCATION:  Education details: [id number] Person educated: Patient Education method: Explanation, Demonstration, Tactile cues, Verbal cues, and Handouts Education comprehension: verbalized understanding and returned demonstration  HOME EXERCISE PROGRAM: [id number]  ASSESSMENT:  CLINICAL IMPRESSION: Patient presents for treatment today, focus on coordination of breathing and /or pelvic floor contraction with activity. Pt tolerated well today and reports continued improvement with symptoms. Does continue to benefit from moderate cues verbally for improved coordination and activation of pelvic floor and decreased strain at pelvic floor with activity. Pt would benefit from additional PT to further address deficits.    OBJECTIVE IMPAIRMENTS: decreased activity tolerance, decreased coordination, decreased endurance, decreased mobility, decreased strength, increased muscle spasms, impaired flexibility, improper body mechanics, and postural dysfunction.   ACTIVITY LIMITATIONS: carrying, lifting, squatting, and continence  PARTICIPATION LIMITATIONS: community activity  PERSONAL FACTORS: Time since onset of  injury/illness/exacerbation and 1 comorbidity: medical history  are also affecting patient's functional outcome.   REHAB POTENTIAL: Good  CLINICAL DECISION MAKING: Stable/uncomplicated  EVALUATION COMPLEXITY: Low   GOALS: Goals reviewed with patient? Yes  SHORT TERM GOALS: Target date: 06/21/23  Pt to be I with HEP.  Baseline: Goal status: INITIAL  2.  Pt will have 25% less urgency due to bladder retraining and strengthening  Baseline:  Goal status: INITIAL  3.  Pt to demonstrate at least 4/5 pelvic floor strength with at least 5s isometric for improved pelvic stability and decreased strain at pelvic floor/ decrease leakage.  Baseline:  Goal status: INITIAL  4.  Pt to demonstrate improved coordination of pelvic floor and breathing mechanics with body weight squat with appropriate synergistic patterns to decrease pain and leakage at least 50% of the time.    Baseline:  Goal status: INITIAL   LONG TERM GOALS: Target date: 07/24/23  Pt to be I with advanced HEP.  Baseline:  Goal status: INITIAL  2.  Pt will have 50% less urgency due to bladder retraining and strengthening  Baseline:  Goal status: INITIAL  3.  Pt to demonstrate at least 4/5 pelvic floor strength with 8s isometric for improved pelvic stability and decreased strain at pelvic floor/ decrease leakage.  Baseline:  Goal status:  INITIAL  4.  Pt to demonstrate improved coordination of pelvic floor and breathing mechanics with 10# squat with appropriate synergistic patterns to decrease pain and leakage at least 75% of the time.    Baseline:  Goal status: INITIAL  5.  Pt to report no more than one instance or urinary leakage per day to decrease symptoms and improve QOL Baseline:  Goal status: INITIAL    PLAN:  PT FREQUENCY: 1x/week  PT DURATION:  8 sessions  PLANNED INTERVENTIONS: Therapeutic exercises, Therapeutic activity, Neuromuscular re-education, Patient/Family education, Self Care, Joint  mobilization, Aquatic Therapy, Dry Needling, Spinal mobilization, Cryotherapy, Moist heat, scar mobilization, Taping, Biofeedback, Manual therapy, and Re-evaluation  PLAN FOR NEXT SESSION: coordination of pelvic floor and breathing with and without exercises, core and hip and pelvic floor strengthening, breathing mechanics, pressure management, knack    Otelia Sergeant, PT, DPT 10/28/243:31 PM  [facility] [address] [place], [place] [address] Phone # [phone] Fax [phone]

## 2023-06-20 ENCOUNTER — Other Ambulatory Visit: Payer: Self-pay

## 2023-06-20 DIAGNOSIS — R053 Chronic cough: Secondary | ICD-10-CM

## 2023-06-20 MED ORDER — HYDROCOD POLI-CHLORPHE POLI ER 10-8 MG/5ML PO SUER: 5.0000 mL | Freq: Two times a day (BID) | ORAL | 0 refills | Status: DC | PRN

## 2023-06-20 NOTE — Telephone Encounter (Signed)
Patient has request refill on medication. Medication pend and sent to PCP Janyth Contes Janene Harvey, NP for approval.

## 2023-06-23 DIAGNOSIS — J3089 Other allergic rhinitis: Secondary | ICD-10-CM | POA: Diagnosis not present

## 2023-06-23 DIAGNOSIS — J3081 Allergic rhinitis due to animal (cat) (dog) hair and dander: Secondary | ICD-10-CM | POA: Diagnosis not present

## 2023-06-23 DIAGNOSIS — J301 Allergic rhinitis due to pollen: Secondary | ICD-10-CM | POA: Diagnosis not present

## 2023-06-28 ENCOUNTER — Ambulatory Visit: Payer: Medicare Other | Admitting: Podiatry

## 2023-06-28 ENCOUNTER — Encounter: Payer: Self-pay | Admitting: Podiatry

## 2023-06-28 DIAGNOSIS — I872 Venous insufficiency (chronic) (peripheral): Secondary | ICD-10-CM

## 2023-06-28 DIAGNOSIS — M79675 Pain in left toe(s): Secondary | ICD-10-CM | POA: Diagnosis not present

## 2023-06-28 DIAGNOSIS — B351 Tinea unguium: Secondary | ICD-10-CM | POA: Diagnosis not present

## 2023-06-28 DIAGNOSIS — M79674 Pain in right toe(s): Secondary | ICD-10-CM | POA: Diagnosis not present

## 2023-06-28 NOTE — Progress Notes (Signed)
This patient returns to my office for at risk foot care.  This patient requires this care by a professional since this patient will be at risk due to having venous insufficiency  This patient is unable to cut nails herself since the patient cannot reach her nails.These nails are painful walking and wearing shoes.  This patient presents for at risk foot care today.  General Appearance  Alert, conversant and in no acute stress.  Vascular  Dorsalis pedis and posterior tibial  pulses are palpable  bilaterally.  Capillary return is within normal limits  bilaterally. Temperature is within normal limits  bilaterally.  Neurologic  Senn-Weinstein monofilament wire test within normal limits  bilaterally. Muscle power within normal limits bilaterally.  Nails Thick disfigured discolored nails with subungual debris  from hallux to fifth toes bilaterally. No evidence of bacterial infection or drainage bilaterally.  Orthopedic  No limitations of motion  feet .  No crepitus or effusions noted.  HAV with hammer toes  B/L.  Skin  normotropic skin with no porokeratosis noted bilaterally.  No signs of infections or ulcers noted.     Onychomycosis  Pain in right toes  Pain in left toes  Consent was obtained for treatment procedures.   Mechanical debridement of nails 1-5  bilaterally performed with a nail nipper.  Filed with dremel without incident. No infection or ulcer.     Return office visit    12 weeks                Told patient to return for periodic foot care and evaluation due to potential at risk complications.   Hiro Vipond DPM  

## 2023-07-07 DIAGNOSIS — J3089 Other allergic rhinitis: Secondary | ICD-10-CM | POA: Diagnosis not present

## 2023-07-07 DIAGNOSIS — J301 Allergic rhinitis due to pollen: Secondary | ICD-10-CM | POA: Diagnosis not present

## 2023-07-07 DIAGNOSIS — J3081 Allergic rhinitis due to animal (cat) (dog) hair and dander: Secondary | ICD-10-CM | POA: Diagnosis not present

## 2023-07-13 DIAGNOSIS — M8588 Other specified disorders of bone density and structure, other site: Secondary | ICD-10-CM | POA: Diagnosis not present

## 2023-07-13 DIAGNOSIS — Z1231 Encounter for screening mammogram for malignant neoplasm of breast: Secondary | ICD-10-CM | POA: Diagnosis not present

## 2023-07-13 DIAGNOSIS — J45909 Unspecified asthma, uncomplicated: Secondary | ICD-10-CM | POA: Diagnosis not present

## 2023-07-13 DIAGNOSIS — R29890 Loss of height: Secondary | ICD-10-CM | POA: Diagnosis not present

## 2023-07-13 LAB — HM DEXA SCAN

## 2023-07-13 LAB — HM MAMMOGRAPHY

## 2023-07-14 ENCOUNTER — Other Ambulatory Visit: Payer: Self-pay | Admitting: Nurse Practitioner

## 2023-07-14 DIAGNOSIS — I10 Essential (primary) hypertension: Secondary | ICD-10-CM

## 2023-07-28 ENCOUNTER — Telehealth: Payer: Self-pay | Admitting: Nurse Practitioner

## 2023-07-28 ENCOUNTER — Encounter: Payer: Self-pay | Admitting: Nurse Practitioner

## 2023-07-28 ENCOUNTER — Ambulatory Visit (INDEPENDENT_AMBULATORY_CARE_PROVIDER_SITE_OTHER): Payer: Medicare Other | Admitting: Nurse Practitioner

## 2023-07-28 VITALS — BP 140/88 | HR 74 | Temp 97.9°F | Ht 64.0 in | Wt 222.2 lb

## 2023-07-28 DIAGNOSIS — M8580 Other specified disorders of bone density and structure, unspecified site: Secondary | ICD-10-CM

## 2023-07-28 DIAGNOSIS — N3281 Overactive bladder: Secondary | ICD-10-CM

## 2023-07-28 DIAGNOSIS — J301 Allergic rhinitis due to pollen: Secondary | ICD-10-CM | POA: Diagnosis not present

## 2023-07-28 DIAGNOSIS — R600 Localized edema: Secondary | ICD-10-CM

## 2023-07-28 DIAGNOSIS — F4322 Adjustment disorder with anxiety: Secondary | ICD-10-CM

## 2023-07-28 DIAGNOSIS — E785 Hyperlipidemia, unspecified: Secondary | ICD-10-CM

## 2023-07-28 DIAGNOSIS — J3081 Allergic rhinitis due to animal (cat) (dog) hair and dander: Secondary | ICD-10-CM | POA: Diagnosis not present

## 2023-07-28 DIAGNOSIS — R053 Chronic cough: Secondary | ICD-10-CM | POA: Diagnosis not present

## 2023-07-28 DIAGNOSIS — J3089 Other allergic rhinitis: Secondary | ICD-10-CM | POA: Diagnosis not present

## 2023-07-28 DIAGNOSIS — J4520 Mild intermittent asthma, uncomplicated: Secondary | ICD-10-CM

## 2023-07-28 DIAGNOSIS — K219 Gastro-esophageal reflux disease without esophagitis: Secondary | ICD-10-CM | POA: Diagnosis not present

## 2023-07-28 DIAGNOSIS — I10 Essential (primary) hypertension: Secondary | ICD-10-CM | POA: Diagnosis not present

## 2023-07-28 NOTE — Patient Instructions (Signed)
To check blood pressure 1 hour AFTER you have had your medication Make sure you have been sitting at least 5 mins  Record and let us know.  Goal <140/90

## 2023-07-28 NOTE — Progress Notes (Signed)
Careteam: Patient Care Team: Sharon Seller, NP as PCP - General (Geriatric Medicine) Mateo Flow, MD as Consulting Physician (Ophthalmology)  PLACE OF SERVICE:  [facility]  Advanced Directive information Does Patient Have a Medical Advance Directive?: Yes, Type of Advance Directive: Out of facility DNR (pink MOST or yellow form), Pre-existing out of facility DNR order (yellow form or pink MOST form): Pink MOST form placed in chart (order not valid for inpatient use), Does patient want to make changes to medical advance directive?: No - Patient declined  Allergies  Allergen Reactions   Atenolol Other (See Comments)   Lisinopril Other (See Comments)   Meperidine Hcl Other (See Comments)   Penicillin G Sodium Other (See Comments)   Demerol Hives    All over the body    Lisinopril-Hydrochlorothiazide Hives    All over the body   Penicillins Hives    Has patient had a PCN reaction causing immediate rash, facial/tongue/throat swelling, SOB or lightheadedness with hypotension: No Has patient had a PCN reaction causing severe rash involving mucus membranes or skin necrosis: No Has patient had a PCN reaction that required hospitalization: No Has patient had a PCN reaction occurring within the last 10 years: No If all of the above answers are "NO", then may proceed with Cephalosporin use.   All over the body    Chief Complaint  Patient presents with   Medical Management of Chronic Issues    6 month follow-up. Discuss need for flu vaccine.      HPI: Patient is a 78 y.o. female for routine follow up.   Cough has been stable. Some days she coughs more than others.  Seeing allergy doctor at this time, has not seen pulmonary in a while.  No shortness of breath, wheezing.  Continues on singulair.   GERD is controlled.   OAB-on oxybutynin twice daily, symptoms controlled.   Bp elevated today but she is unsure why- she does get nervous coming to the office. Reports when  she took at home it was normal.   Constipation- on colace and prune juice.   No pain   She got her mammogram and bone density at [facility].   Review of Systems:  Review of Systems  Constitutional:  Negative for chills, fever and weight loss.  HENT:  Negative for tinnitus.   Respiratory:  Positive for cough. Negative for sputum production and shortness of breath.   Cardiovascular:  Negative for chest pain, palpitations and leg swelling.  Gastrointestinal:  Negative for abdominal pain, constipation, diarrhea and heartburn.  Genitourinary:  Negative for dysuria, frequency and urgency.  Musculoskeletal:  Negative for back pain, falls, joint pain and myalgias.  Skin: Negative.   Neurological:  Negative for dizziness and headaches.  Psychiatric/Behavioral:  Negative for depression and memory loss. The patient does not have insomnia.     Past Medical History:  Diagnosis Date   Abnormal Pap smear    Over 27yrs ago   ALKALINE PHOSPHATASE, ELEVATED 07/01/2009   Qualifier: Diagnosis of  By: Kriste Basque MD, Lonzo Cloud    Allergic rhinitis    Anemia    Arthritis    Asthma    Cataract    bilateral implants   Cough    occasional; 9-21: reports as ongoing cough , managing with cough medicine and scheduled and prn inhalers, reports mucus is clear and scant     Cystocele 11/05/08   Diverticulosis of colon    DJD (degenerative joint disease)    no  per pt   Elevated alkaline phosphatase level    GERD (gastroesophageal reflux disease)    H/O urinary incontinence 11/06/2008   H/O varicella    Hard measles    Hx of colonic polyps    Hypercholesterolemia    Hypertension    Leg swelling    occasional   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 Lance Morin   BLADDER SUSPENSION N/A 05/15/2019   Procedure: TRANSVAGINAL TAPE (TVT) PROCEDURE;  Surgeon: Osborn Coho, MD;  Location: [facility];  Service: Gynecology;  Laterality: N/A;   bladder tack  2011   BREAST SURGERY  1978   cyst removed from both removed   cataracts   approx 10 years   Dr Ninetta Lights , ctaract with lens placement    COLONOSCOPY     CYSTOSCOPY N/A 05/15/2019   Procedure: CYSTOSCOPY;  Surgeon: Osborn Coho, MD;  Location: [facility];  Service: Gynecology;  Laterality: N/A;   ESOPHAGOGASTRODUODENOSCOPY     Social History:   reports that she has never smoked. She has never used smokeless tobacco. She reports current alcohol use of about 2.0 standard drinks of alcohol per week. She reports that she does not use drugs.  Family History  Problem Relation Age of Onset   Aneurysm Mother    Lung cancer Father    Hypertension Daughter    Hypertension Daughter    Colon cancer Neg Hx    Esophageal cancer Neg Hx    Stomach cancer Neg Hx    Rectal cancer Neg Hx    Colon polyps Neg Hx     Medications: Patient's Medications  New Prescriptions   No medications on file  Previous Medications   ALBUTEROL (PROVENTIL HFA;VENTOLIN HFA) 108 (90 BASE) MCG/ACT INHALER    Inhale 2 puffs into the lungs every 6 (six) hours as needed for wheezing or shortness of breath.   CALCIUM CARB-CHOLECALCIFEROL (CALCIUM 600 + D PO)    Take 1 tablet by mouth 2 (two) times daily.    CETIRIZINE (ZYRTEC) 10 MG TABLET    Take 10 mg by mouth daily.   CHLORPHENIRAMINE-HYDROCODONE (TUSSIONEX) 10-8 MG/5ML    Take 5 mLs by mouth every 12 (twelve) hours as needed for  cough.   CHOLECALCIFEROL (D3-1000) 25 MCG (1000 UT) CAPSULE    Take 1,000 Units by mouth daily.   DOCUSATE SODIUM (COLACE) 100 MG CAPSULE    Take 100 mg by mouth daily.   DULOXETINE (CYMBALTA) 60 MG CAPSULE    TAKE 1 CAPSULE (60 MG TOTAL) BY MOUTH DAILY. DX (F43.22)   EPINEPHRINE 0.3 MG/0.3 ML IJ SOAJ INJECTION    epinephrine 0.3 mg/0.3 mL injection, auto-injector   FLUTICASONE-UMECLIDIN-VILANT (TRELEGY ELLIPTA) 200-62.5-25 MCG/ACT AEPB    Inhale 1 puff into the lungs daily.   FUROSEMIDE (LASIX) 40 MG TABLET    TAKE 1 TABLET BY MOUTH  EVERY DAY   LEVOCETIRIZINE (XYZAL) 5 MG TABLET    Take 1 tablet (5 mg total) by mouth every evening.   LOSARTAN (COZAAR) 100 MG TABLET    TAKE 1 TABLET BY MOUTH EVERY DAY   MONTELUKAST (SINGULAIR) 10 MG TABLET    Take 10 mg by mouth at bedtime.   MULTIPLE VITAMINS-MINERALS (MACULAR VITAMIN BENEFIT PO)    Take 1 capsule by mouth 2 (two) times daily.   NIFEDIPINE (PROCARDIA XL/NIFEDICAL XL) 60 MG 24 HR TABLET    TAKE 1 TABLET BY MOUTH EVERY DAY   NON FORMULARY    Allergy shots every 2 weeks   OXYBUTYNIN (DITROPAN) 5 MG TABLET    TAKE 1 TABLET BY MOUTH TWICE A DAY   PANTOPRAZOLE (PROTONIX) 40 MG TABLET    TAKE 1 TABLET BY MOUTH TWICE A DAY FOR STOMACH   POTASSIUM CHLORIDE SA (KLOR-CON M20) 20 MEQ TABLET    TAKE TWO TABLETS BY MOUTH ONCE DAILY FOR POTASSIUM SUPPLEMENT   PRAVASTATIN (PRAVACHOL) 40 MG TABLET    TAKE 1 TABLET BY MOUTH EVERY DAY   SYSTANE ULTRA 0.4-0.3 % SOLN    SMARTSIG:1 Drop(s) In Eye(s) As Needed   VITAMIN B-12 (CYANOCOBALAMIN) 1000 MCG TABLET    Take 1,000 mcg by mouth daily.  Modified Medications   No medications on file  Discontinued Medications   BUDESONIDE-FORMOTEROL (SYMBICORT) 160-4.5 MCG/ACT INHALER    Inhale 2 puffs into the lungs 2 (two) times daily.   FLUTICASONE (FLONASE) 50 MCG/ACT NASAL SPRAY    2 sprays daily.   SPIRIVA HANDIHALER 18 MCG INHALATION CAPSULE    Place 18 mcg into inhaler and inhale daily.     Physical Exam:  Vitals:    07/28/23 1105 07/28/23 1315  BP: (!) 140/92 (!) 140/88  Pulse: 74   Temp: 97.9 F (36.6 C)   TempSrc: Temporal   SpO2: 98%   Weight: 222 lb 3.2 oz (100.8 kg)   Height: 5\' 4"  (1.626 m)    Body mass index is 38.14 kg/m. Wt Readings from Last 3 Encounters:  07/28/23 222 lb 3.2 oz (100.8 kg)  01/23/23 227 lb 9.6 oz (103.2 kg)  10/03/22 227 lb 1.2 oz (103 kg)    Physical Exam Constitutional:      General: She is not in acute distress.    Appearance: She is well-developed. She is not diaphoretic.  HENT:     Head: Normocephalic and atraumatic.     Mouth/Throat:     Pharynx: No oropharyngeal exudate.  Eyes:     Conjunctiva/sclera: Conjunctivae normal.     Pupils: Pupils are equal, round, and reactive to light.  Cardiovascular:     Rate and Rhythm: Normal rate and regular rhythm.     Heart sounds: Normal heart sounds.  Pulmonary:     Effort: Pulmonary effort is normal.     Breath sounds: Normal breath sounds.  Abdominal:     General: Bowel sounds are normal.     Palpations: Abdomen is soft.  Musculoskeletal:     Cervical back: Normal range of motion and neck supple.     Right lower leg: No edema.     Left lower leg: No edema.  Skin:    General: Skin is warm and dry.  Neurological:     Mental Status: She is alert.  Psychiatric:        Mood and Affect: Mood normal.     Labs reviewed: Basic Metabolic Panel: Recent Labs    10/03/22 1202  01/23/23 1035  NA 140 142  K 3.6 4.4  CL 110 108  CO2 25 27  GLUCOSE 111* 69  BUN 13 15  CREATININE 0.75 0.78  CALCIUM 9.3 9.5   Liver Function Tests: Recent Labs    10/03/22 1202 01/23/23 1035  AST 36 19  ALT 24 15  ALKPHOS 163*  --   BILITOT 0.5 0.4  PROT 7.9 7.3  ALBUMIN 3.8  --    No results for input(s): "LIPASE", "AMYLASE" in the last 8760 hours. No results for input(s): "AMMONIA" in the last 8760 hours. CBC: Recent Labs    10/03/22 1202 01/23/23 1035  WBC 6.4 5.5  NEUTROABS  --  2,332  HGB 11.5* 11.0*   HCT 37.0 34.1*  MCV 84.3 83.4  PLT 259 258   Lipid Panel: No results for input(s): "CHOL", "HDL", "LDLCALC", "TRIG", "CHOLHDL", "LDLDIRECT" in the last 8760 hours. TSH: No results for input(s): "TSH" in the last 8760 hours. A1C: Lab Results  Component Value Date   HGBA1C 5.6 11/02/2021     Assessment/Plan 1. Essential hypertension --Blood pressure elevated today but typically well controlled -home blood pressures are well controlled -No changes to medications today  -will have pt continue to monitor home bp goal <140/90, to notify if readings remain high on 3 different days  -follow metabolic panel - COMPLETE METABOLIC PANEL WITH GFR - CBC with Differential/Platelet  2. Chronic cough -ongoing and stable.   3. Morbid obesity (HCC) BMI 38, -education provided on healthy weight loss through increase in physical activity and proper nutrition   4. Mild intermittent asthma, unspecified whether complicated -well controlled on trelegy ellipta and singulair   5. Gastroesophageal reflux disease without esophagitis Controlled on protonix.  6. Bilateral leg edema Stable -encouraged to elevate legs above level of heart as tolerates, low sodium diet, compression hose as tolerates (on in am, off in pm)  7. Overactive bladder Controlled on oxybutynin   8. Adjustment disorder with anxious mood -controlled on cymbalta  9. Osteopenia, unspecified location -Recommended to take calcium 600 mg twice daily with Vitamin D 2000 units daily and weight bearing activity 30 mins/5 days a week  10. Hyperlipidemia LDL goal <130 -continues on pravstatin - Lipid panel   Return in about 6 months (around 01/26/2024) for routine follow up.  Janene Harvey. Biagio Borg [facility] [phone]

## 2023-07-28 NOTE — Telephone Encounter (Signed)
Please call patient and let her know she has worsening osteopenia.  Continue cal and vit d  Would encourage her to do more weight bearing exercises to help build up the bone.  Walking 30 mins/5 days a week to prevent going into osteoporosis.

## 2023-07-28 NOTE — Telephone Encounter (Signed)
-----   Message from [place] L sent at 07/28/2023  2:28 PM EST ----- [facility] -bone density

## 2023-07-29 LAB — CBC WITH DIFFERENTIAL/PLATELET
Absolute Lymphocytes: 2863 {cells}/uL (ref 850–3900)
Absolute Monocytes: 592 {cells}/uL (ref 200–950)
Basophils Absolute: 41 {cells}/uL (ref 0–200)
Basophils Relative: 0.6 %
Eosinophils Absolute: 143 {cells}/uL (ref 15–500)
Eosinophils Relative: 2.1 %
HCT: 35.2 % (ref 35.0–45.0)
Hemoglobin: 11.5 g/dL — ABNORMAL LOW (ref 11.7–15.5)
MCH: 27.4 pg (ref 27.0–33.0)
MCHC: 32.7 g/dL (ref 32.0–36.0)
MCV: 84 fL (ref 80.0–100.0)
MPV: 11 fL (ref 7.5–12.5)
Monocytes Relative: 8.7 %
Neutro Abs: 3162 {cells}/uL (ref 1500–7800)
Neutrophils Relative %: 46.5 %
Platelets: 276 10*3/uL (ref 140–400)
RBC: 4.19 10*6/uL (ref 3.80–5.10)
RDW: 13.8 % (ref 11.0–15.0)
Total Lymphocyte: 42.1 %
WBC: 6.8 10*3/uL (ref 3.8–10.8)

## 2023-07-29 LAB — LIPID PANEL
Cholesterol: 173 mg/dL (ref <200)
HDL: 56 mg/dL (ref >=50)
LDL Cholesterol (Calc): 98 mg/dL
Non-HDL Cholesterol (Calc): 117 mg/dL (ref <130)
Total CHOL/HDL Ratio: 3.1 (calc) (ref <5.0)
Triglycerides: 101 mg/dL (ref <150)

## 2023-07-29 LAB — COMPLETE METABOLIC PANEL WITH GFR
AG Ratio: 1.1 (calc) (ref 1.0–2.5)
ALT: 18 U/L (ref 6–29)
AST: 22 U/L (ref 10–35)
Albumin: 3.8 g/dL (ref 3.6–5.1)
Alkaline phosphatase (APISO): 165 U/L — ABNORMAL HIGH (ref 37–153)
BUN: 12 mg/dL (ref 7–25)
CO2: 27 mmol/L (ref 20–32)
Calcium: 9.4 mg/dL (ref 8.6–10.4)
Chloride: 106 mmol/L (ref 98–110)
Creat: 0.77 mg/dL (ref 0.60–1.00)
Globulin: 3.4 g/dL (ref 1.9–3.7)
Glucose, Bld: 86 mg/dL (ref 65–139)
Potassium: 4.4 mmol/L (ref 3.5–5.3)
Sodium: 139 mmol/L (ref 135–146)
Total Bilirubin: 0.5 mg/dL (ref 0.2–1.2)
Total Protein: 7.2 g/dL (ref 6.1–8.1)
eGFR: 78 mL/min/{1.73_m2} (ref >=60)

## 2023-07-31 NOTE — Telephone Encounter (Signed)
Discussed results with patient, patient verbalized understanding of results  

## 2023-08-04 DIAGNOSIS — J3089 Other allergic rhinitis: Secondary | ICD-10-CM | POA: Diagnosis not present

## 2023-08-04 DIAGNOSIS — J301 Allergic rhinitis due to pollen: Secondary | ICD-10-CM | POA: Diagnosis not present

## 2023-08-04 DIAGNOSIS — J3081 Allergic rhinitis due to animal (cat) (dog) hair and dander: Secondary | ICD-10-CM | POA: Diagnosis not present

## 2023-08-09 ENCOUNTER — Ambulatory Visit: Payer: Medicare Other | Attending: Obstetrics and Gynecology | Admitting: Physical Therapy

## 2023-08-09 DIAGNOSIS — M6281 Muscle weakness (generalized): Secondary | ICD-10-CM | POA: Insufficient documentation

## 2023-08-09 DIAGNOSIS — R279 Unspecified lack of coordination: Secondary | ICD-10-CM | POA: Diagnosis not present

## 2023-08-09 DIAGNOSIS — J3081 Allergic rhinitis due to animal (cat) (dog) hair and dander: Secondary | ICD-10-CM | POA: Diagnosis not present

## 2023-08-09 DIAGNOSIS — R293 Abnormal posture: Secondary | ICD-10-CM | POA: Insufficient documentation

## 2023-08-09 DIAGNOSIS — M62838 Other muscle spasm: Secondary | ICD-10-CM | POA: Diagnosis not present

## 2023-08-09 DIAGNOSIS — J3089 Other allergic rhinitis: Secondary | ICD-10-CM | POA: Diagnosis not present

## 2023-08-09 DIAGNOSIS — J301 Allergic rhinitis due to pollen: Secondary | ICD-10-CM | POA: Diagnosis not present

## 2023-08-09 NOTE — Therapy (Signed)
OUTPATIENT PHYSICAL THERAPY FEMALE PELVIC TREATMENT   Patient Name: Wendy Figueroa MRN: [id number] DOB:06/13/1946, 77 y.o., female Today's Date: 08/09/2023  END OF SESSION:  PT End of Session - 08/09/23 1149     Visit Number 6    Date for PT Re-Evaluation 10/10/23    Authorization Type [employer] MCR    Authorization Time Period [employer] Approved 8 -05/24/2023-07/16/2023 [id number]    Authorization - Visit Number 5    Authorization - Number of Visits 8    Progress Note Due on Visit 10    PT Start Time 1146    PT Stop Time 1230    PT Time Calculation (min) 44 min    Activity Tolerance Patient tolerated treatment well    Behavior During Therapy WFL for tasks assessed/performed              Past Medical History:  Diagnosis Date   Abnormal Pap smear    Over 71yrs ago   ALKALINE PHOSPHATASE, ELEVATED 07/01/2009   Qualifier: Diagnosis of  By: Kriste Basque MD, Lonzo Cloud    Allergic rhinitis    Anemia    Arthritis    Asthma    Cataract    bilateral implants   Cough    occasional; 9-21: reports as ongoing cough , managing with cough medicine and scheduled and prn inhalers, reports mucus is clear and scant     Cystocele 11/05/08   Diverticulosis of colon    DJD (degenerative joint disease)    no per pt   Elevated alkaline phosphatase level    GERD (gastroesophageal reflux disease)    H/O urinary incontinence 11/06/2008   H/O varicella    Hard measles    Hx of colonic polyps    Hypercholesterolemia    Hypertension    Leg swelling    occasional   LPRD (laryngopharyngeal reflux disease) 05/29/2015   Overweight(278.02)    Rectocele 11/06/2008   Large   Sebaceous cyst    on back   Sinusitis    Venous insufficiency    Wheezing    occasional   Yeast infection    Past Surgical History:  Procedure Laterality Date   ABDOMINAL HYSTERECTOMY  1988   ANTERIOR AND POSTERIOR REPAIR  11/05/08   ANTERIOR AND POSTERIOR REPAIR N/A 05/15/2019   Procedure: ANTERIOR (CYSTOCELE) AND POSTERIOR  REPAIR (RECTOCELE);  Surgeon: Osborn Coho, MD;  Location: [facility];  Service: Gynecology;  Laterality: N/A;   APOGEE / PERIGEE REPAIR  10/2008   Dr. Lance Morin   BLADDER SUSPENSION N/A 05/15/2019   Procedure: TRANSVAGINAL TAPE (TVT) PROCEDURE;  Surgeon: Osborn Coho, MD;  Location: [facility];  Service: Gynecology;  Laterality: N/A;   bladder tack  2011   BREAST SURGERY  1978   cyst removed from both removed   cataracts   approx 10 years   Dr Ninetta Lights , ctaract with lens placement    COLONOSCOPY     CYSTOSCOPY N/A 05/15/2019   Procedure: CYSTOSCOPY;  Surgeon: Osborn Coho, MD;  Location: [facility];  Service: Gynecology;  Laterality: N/A;   ESOPHAGOGASTRODUODENOSCOPY     Patient Active Problem List   Diagnosis Date Noted   ILD (interstitial lung disease) (HCC) 07/13/2020   Abnormal cervical Papanicolaou smear 07/12/2019   Urinary urgency 07/12/2019   Urinary incontinence 05/15/2019   Cystocele with rectocele 05/15/2019   Pain due to onychomycosis of toenails of both feet 02/20/2019   Adjustment disorder with anxious mood 06/08/2018   History  of pulmonary embolism 06/05/2018   Pharyngoesophageal dysphagia 04/18/2016   Alkaline phosphatase elevation 05/29/2015   Asthma, mild intermittent 05/29/2015   Other allergic rhinitis 05/29/2015   LPRD (laryngopharyngeal reflux disease) 05/29/2015   Chronic cough 05/29/2015   Dysphonia 05/29/2015   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   Globus sensation 04/02/2015   Osteopenia 07/08/2014   Hypokalemia 06/04/2014   Hyperlipidemia LDL goal <130 06/04/2014   DEGENERATIVE JOINT DISEASE 07/01/2009   Disorder of bone and cartilage 07/01/2009   INSOMNIA 08/11/2008   HYPERCHOLESTEROLEMIA, BORDERLINE 03/20/2008   Overweight 03/20/2008   Allergic rhinitis 03/20/2008   DIVERTICULOSIS OF COLON 03/20/2008   COLONIC POLYPS 09/27/2007   Essential hypertension 09/27/2007   Venous  (peripheral) insufficiency 09/27/2007   Asthma 09/27/2007   GERD 09/27/2007    PCP: Abbey Chatters, NP  REFERRING PROVIDER: Osborn Coho, MD  REFERRING DIAG: R32 (ICD-10-CM) - Unspecified urinary incontinence  THERAPY DIAG:  Muscle weakness (generalized)  Other muscle spasm  Unspecified lack of coordination  Abnormal posture  Rationale for Evaluation and Treatment: Rehabilitation  ONSET DATE: years  SUBJECTIVE:                                                                                                                                                                                           SUBJECTIVE STATEMENT: I have been so busy, I forgot about urge drill often. But even without it I have only had 1-2 small drops of urine loss with coughing and urge to get there in time. Does wake with night time pad "damp" but was completely saturated. Bowels - they don't move and I have to take sometimes then they go too much. I use prune juice or mirilax and that works. I have noticed the more active I am they go better. The only time I have an accident or smear of stool is when I have a coughing spell.  Pt reports I feel 60% better since starting PT  Fluid intake: Yes: water - 5-6 8oz cups; coffee on Mondays and Fridays; 1-2 sodas a week    PAIN:  Are you having pain? No NPRS scale: 0/10 Pain location:  na  Pain type:na Pain description: na  Aggravating factors: na Relieving factors: na  PRECAUTIONS: None  RED FLAGS: None   WEIGHT BEARING RESTRICTIONS: No  FALLS:  Has patient fallen in last 6 months? No  LIVING ENVIRONMENT: Lives with: lives with their family Lives in: House/apartment   OCCUPATION: retired   PLOF: Independent  PATIENT GOALS: to have less leakage   PERTINENT HISTORY:  Hysterectomy, rectocele and cystocele repair.  Sexual abuse: No  BOWEL MOVEMENT: Pain  with bowel movement: No Type of bowel movement:Type (Bristol Stool Scale) 4,  Frequency every other day, and Strain No Fully empty rectum: No not all the time Leakage: Yes: after having bowel movement  Pads: Yes: 1 change daily Fiber supplement: Yes: colace daily  URINATION: Pain with urination: No Fully empty bladder: Yes:   Stream: Strong and Weak Urgency: Yes:   Frequency: faster with lasix but without much longer  Leakage: Urge to void, Walking to the bathroom, Coughing, Sneezing, and Laughing and at night while sleeping thinks she leakage with turning over in bed but this doesn't wake her. States in the morning with waking pad is usually saturated but about once weekly is dry Pads: Yes: 1x daily and 1x with waking . Wears night time at night and lighter during day  INTERCOURSE: Pain with intercourse:  not active    PREGNANCY: Vaginal deliveries 2 Tearing Yes: episiotomy  C-section deliveries 0 Currently pregnant No  PROLAPSE: None   OBJECTIVE:  Note: Objective measures were completed at Evaluation unless otherwise noted.  DIAGNOSTIC FINDINGS:    COGNITION: Overall cognitive status: Within functional limits for tasks assessed     SENSATION: Light touch: Appears intact Proprioception: Appears intact  MUSCLE LENGTH: Bil hamstrings and adductors limited by 50%   GAIT: WFL  POSTURE: rounded shoulders, forward head, and posterior pelvic tilt  PELVIC ALIGNMENT:WFL  LUMBARAROM/PROM:  A/PROM A/PROM  eval  Flexion Limited by 50%  Extension Limited by 25%  Right lateral flexion Limited by 50%  Left lateral flexion Limited by 50%  Right rotation Limited by 50%  Left rotation Limited by 50%   (Blank rows = not tested)  LOWER EXTREMITY ROM: WFL  LOWER EXTREMITY MMT:  Bil hips grossly 3+/5; knees 5/5 PALPATION:   General  no TTP, tightness at lumbar spine and bil gluteals                External Perineal Exam WFL, no TTP                             Internal Pelvic Floor no TTP  Patient confirms identification and approves PT  to assess internal pelvic floor and treatment Yes No emotional/communication barriers or cognitive limitation. Patient is motivated to learn. Patient understands and agrees with treatment goals and plan. PT explains patient will be examined in standing, sitting, and lying down to see how their muscles and joints work. When they are ready, they will be asked to remove their underwear so PT can examine their perineum. The patient is also given the option of providing their own chaperone as one is not provided in our facility. The patient also has the right and is explained the right to defer or refuse any part of the evaluation or treatment including the internal exam. With the patient's consent, PT will use one gloved finger to gently assess the muscles of the pelvic floor, seeing how well it contracts and relaxes and if there is muscle symmetry. After, the patient will get dressed and PT and patient will discuss exam findings and plan of care. PT and patient discuss plan of care, schedule, attendance policy and HEP activities.   PELVIC MMT:   MMT eval  Vaginal 3/5 improved to 4/5 with cues for technique and breathing coordination; 1s; 4 reps  Internal Anal Sphincter   External Anal Sphincter   Puborectalis   Diastasis Recti   (Blank rows = not tested)  TONE: WFL  PROLAPSE: Not seen in hooklying with cough however limited visualization with body habitus   TODAY'S TREATMENT:                                                                                                                              DATE:   06/19/23:  NuStep 8 mins L5 PT present to discuss progress NMRE: all exercises for exhale and pelvic floor contraction with activity for improved coordination of muscle activation and decreased stress at pelvic floor for decreased leakage.   Bridges 2x10 Red band hip abductions 2x10 hooklying  Hooklying marching red band 2x10 2x10 Sit to stand 7# 150' 7#+5# and 34' 5# farmers  carry  08/09/23: Reviewed urge drill, educated on voiding mechanics and abdominal massage for improved bowel evacuation and decreased leakage - handout given Nustep L6 for 4 mins and L4 3 mins PT present to discuss progress X10 seated pelvic floor  contractions 2x10 quick flicks X10 5s isometrics   PATIENT EDUCATION:  Education details: [id number] Person educated: Patient Education method: Explanation, Demonstration, Tactile cues, Verbal cues, and Handouts Education comprehension: verbalized understanding and returned demonstration  HOME EXERCISE PROGRAM: [id number]  ASSESSMENT:  CLINICAL IMPRESSION: Patient presents for treatment today, focus on education for improved bowel habits and pelvic floor strengthening. Pt tolerated well and does endorse consistent improvement now up to feeling 60% better. Pt declined internal today, requesting to wait until next appointment. Pt meeting goals, has been limited in returning to clinic due to scheduling but has several consistent appointments moving forward. Motivated to continue and would  benefit from additional PT to further address deficits.    OBJECTIVE IMPAIRMENTS: decreased activity tolerance, decreased coordination, decreased endurance, decreased mobility, decreased strength, increased muscle spasms, impaired flexibility, improper body mechanics, and postural dysfunction.   ACTIVITY LIMITATIONS: carrying, lifting, squatting, and continence  PARTICIPATION LIMITATIONS: community activity  PERSONAL FACTORS: Time since onset of injury/illness/exacerbation and 1 comorbidity: medical history  are also affecting patient's functional outcome.   REHAB POTENTIAL: Good  CLINICAL DECISION MAKING: Stable/uncomplicated  EVALUATION COMPLEXITY: Low   GOALS: Goals reviewed with patient? Yes  SHORT TERM GOALS: Target date: 06/21/23  Pt to be I with HEP.  Baseline: Goal status:MET  2.  Pt will have 25% less urgency due to bladder retraining  and strengthening  Baseline:  Goal status: MET  3.  Pt to demonstrate at least 4/5 pelvic floor strength with at least 5s isometric for improved pelvic stability and decreased strain at pelvic floor/ decrease leakage.  Baseline:  Goal status: on going  4.  Pt to demonstrate improved coordination of pelvic floor and breathing mechanics with body weight squat with appropriate synergistic patterns to decrease pain and leakage at least 50% of the time.    Baseline:  Goal status: on going   LONG TERM GOALS: Target date: 07/24/23  Pt to be I with advanced HEP.  Baseline:  Goal status: on going  2.  Pt will have 50% less urgency due to bladder retraining and strengthening  Baseline:  Goal status: MET  3.  Pt to demonstrate at least 4/5 pelvic floor strength with 8s isometric for improved pelvic stability and decreased strain at pelvic floor/ decrease leakage.  Baseline:  Goal status: on going  4.  Pt to demonstrate improved coordination of pelvic floor and breathing mechanics with 10# squat with appropriate synergistic patterns to decrease pain and leakage at least 75% of the time.    Baseline:  Goal status: on going  5.  Pt to report no more than one instance or urinary leakage per day to decrease symptoms and improve QOL Baseline:  Goal status: MET    PLAN:  PT FREQUENCY: 1x/week  PT DURATION:  8 sessions  PLANNED INTERVENTIONS: Therapeutic exercises, Therapeutic activity, Neuromuscular re-education, Patient/Family education, Self Care, Joint mobilization, Aquatic Therapy, Dry Needling, Spinal mobilization, Cryotherapy, Moist heat, scar mobilization, Taping, Biofeedback, Manual therapy, and Re-evaluation  PLAN FOR NEXT SESSION: coordination of pelvic floor and breathing with and without exercises, core and hip and pelvic floor strengthening, breathing mechanics, pressure management, knack    Otelia Sergeant, PT, DPT 12/18/241:11 PM  [facility] [address], [address] [place], [place] [address] Phone # [phone] Fax [phone]

## 2023-08-10 ENCOUNTER — Other Ambulatory Visit: Payer: Self-pay | Admitting: Nurse Practitioner

## 2023-08-10 DIAGNOSIS — I10 Essential (primary) hypertension: Secondary | ICD-10-CM

## 2023-08-15 ENCOUNTER — Ambulatory Visit: Payer: Medicare Other | Admitting: Physical Therapy

## 2023-08-24 ENCOUNTER — Ambulatory Visit: Payer: Medicare Other | Attending: Obstetrics and Gynecology | Admitting: Physical Therapy

## 2023-08-24 DIAGNOSIS — R279 Unspecified lack of coordination: Secondary | ICD-10-CM | POA: Diagnosis not present

## 2023-08-24 DIAGNOSIS — R293 Abnormal posture: Secondary | ICD-10-CM | POA: Insufficient documentation

## 2023-08-24 DIAGNOSIS — J301 Allergic rhinitis due to pollen: Secondary | ICD-10-CM | POA: Diagnosis not present

## 2023-08-24 DIAGNOSIS — J3081 Allergic rhinitis due to animal (cat) (dog) hair and dander: Secondary | ICD-10-CM | POA: Diagnosis not present

## 2023-08-24 DIAGNOSIS — M6281 Muscle weakness (generalized): Secondary | ICD-10-CM | POA: Diagnosis not present

## 2023-08-24 DIAGNOSIS — J3089 Other allergic rhinitis: Secondary | ICD-10-CM | POA: Diagnosis not present

## 2023-08-24 DIAGNOSIS — M62838 Other muscle spasm: Secondary | ICD-10-CM | POA: Insufficient documentation

## 2023-08-24 NOTE — Therapy (Signed)
 OUTPATIENT PHYSICAL THERAPY FEMALE PELVIC TREATMENT   Patient Name: Wendy Figueroa MRN: [id number] DOB:[birth year], 78 y.o., female Today's Date: 08/24/2023  END OF SESSION:  PT End of Session - 08/24/23 1448     Visit Number 7    Date for PT Re-Evaluation 10/10/23    Authorization Type [employer] MCR    Authorization Time Period [employer] Approved 8 -05/24/2023-07/16/2023 [id number]    Authorization - Visit Number 6    Authorization - Number of Visits 8    Progress Note Due on Visit 10    PT Start Time 1445    PT Stop Time 1526    PT Time Calculation (min) 41 min    Activity Tolerance Patient tolerated treatment well    Behavior During Therapy WFL for tasks assessed/performed              Past Medical History:  Diagnosis Date   Abnormal Pap smear    Over 38yrs ago   ALKALINE PHOSPHATASE, ELEVATED 07/01/2009   Qualifier: Diagnosis of  By: [name], [name]    Allergic rhinitis    Anemia    Arthritis    Asthma    Cataract    bilateral implants   Cough    occasional; 9-21: reports as ongoing cough , managing with cough medicine and scheduled and prn inhalers, reports mucus is clear and scant     Cystocele 11/05/08   Diverticulosis of colon    DJD (degenerative joint disease)    no per pt   Elevated alkaline phosphatase level    GERD (gastroesophageal reflux disease)    H/O urinary incontinence 11/06/2008   H/O varicella    Hard measles    Hx of colonic polyps    Hypercholesterolemia    Hypertension    Leg swelling    occasional   LPRD (laryngopharyngeal reflux disease) 05/29/2015   Overweight(278.02)    Rectocele 11/06/2008   Large   Sebaceous cyst    on back   Sinusitis    Venous insufficiency    Wheezing    occasional   Yeast infection    Past Surgical History:  Procedure Laterality Date   ABDOMINAL HYSTERECTOMY  1988   ANTERIOR AND POSTERIOR REPAIR  11/05/08   ANTERIOR AND POSTERIOR REPAIR N/A 05/15/2019   Procedure: ANTERIOR (CYSTOCELE) AND POSTERIOR  REPAIR (RECTOCELE);  Surgeon: [name], MD;  Location: [facility];  Service: Gynecology;  Laterality: N/A;   APOGEE / PERIGEE REPAIR  10/2008   Dr. [name]   BLADDER SUSPENSION N/A 05/15/2019   Procedure: TRANSVAGINAL TAPE (TVT) PROCEDURE;  Surgeon: [name], MD;  Location: [facility];  Service: Gynecology;  Laterality: N/A;   bladder tack  2011   BREAST SURGERY  1978   cyst removed from both removed   cataracts   approx 10 years   Dr [name] , ctaract with lens placement    COLONOSCOPY     CYSTOSCOPY N/A 05/15/2019   Procedure: CYSTOSCOPY;  Surgeon: [name], MD;  [facility];  Service: Gynecology;  Laterality: N/A;   ESOPHAGOGASTRODUODENOSCOPY     Patient Active Problem List   Diagnosis Date Noted   ILD (interstitial lung disease) (HCC) 07/13/2020   Abnormal cervical Papanicolaou smear 07/12/2019   Urinary urgency 07/12/2019   Urinary incontinence 05/15/2019   Cystocele with rectocele 05/15/2019   Pain due to onychomycosis of toenails of both feet 02/20/2019   Adjustment disorder with anxious mood 06/08/2018   History  of pulmonary embolism 06/05/2018   Pharyngoesophageal dysphagia 04/18/2016   Alkaline phosphatase elevation 05/29/2015   Asthma, mild intermittent 05/29/2015   Other allergic rhinitis 05/29/2015   LPRD (laryngopharyngeal reflux disease) 05/29/2015   Chronic cough 05/29/2015   Dysphonia 05/29/2015   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   Globus sensation 04/02/2015   Osteopenia 07/08/2014   Hypokalemia 06/04/2014   Hyperlipidemia LDL goal <130 06/04/2014   DEGENERATIVE JOINT DISEASE 07/01/2009   Disorder of bone and cartilage 07/01/2009   INSOMNIA 08/11/2008   HYPERCHOLESTEROLEMIA, BORDERLINE 03/20/2008   Overweight 03/20/2008   Allergic rhinitis 03/20/2008   DIVERTICULOSIS OF COLON 03/20/2008   COLONIC POLYPS 09/27/2007   Essential hypertension 09/27/2007   Venous  (peripheral) insufficiency 09/27/2007   Asthma 09/27/2007   GERD 09/27/2007    PCP: Harlene An, NP  REFERRING PROVIDER: [name], MD  REFERRING DIAG: R32 (ICD-10-CM) - Unspecified urinary incontinence  THERAPY DIAG:  Muscle weakness (generalized)  Other muscle spasm  Unspecified lack of coordination  Rationale for Evaluation and Treatment: Rehabilitation  ONSET DATE: years  SUBJECTIVE:                                                                                                                                                                                           SUBJECTIVE STATEMENT: Does still feel she changes pads 1-2x during the day at about 30-50% damp per pt. And waking in the morning damp slightly.   Fluid intake: Yes: water - 5-6 8oz cups; coffee on Mondays and Fridays; 1-2 sodas a week    PAIN:  Are you having pain? No NPRS scale: 0/10 Pain location:  na  Pain type:na Pain description: na  Aggravating factors: na Relieving factors: na  PRECAUTIONS: None  RED FLAGS: None   WEIGHT BEARING RESTRICTIONS: No  FALLS:  Has patient fallen in last 6 months? No  LIVING ENVIRONMENT: Lives with: lives with their family Lives in: House/apartment   OCCUPATION: retired   PLOF: Independent  PATIENT GOALS: to have less leakage   PERTINENT HISTORY:  Hysterectomy, rectocele and cystocele repair.  Sexual abuse: No  BOWEL MOVEMENT: Pain with bowel movement: No Type of bowel movement:Type (Bristol Stool Scale) 4, Frequency every other day, and Strain No Fully empty rectum: No not all the time Leakage: Yes: after having bowel movement  Pads: Yes: 1 change daily Fiber supplement: Yes: colace daily  URINATION: Pain with urination: No Fully empty bladder: Yes:   Stream: Strong and Weak Urgency: Yes:   Frequency: faster with lasix  but without much longer  Leakage: Urge to void, Walking to the bathroom, Coughing, Sneezing, and Laughing and  at  night while sleeping thinks she leakage with turning over in bed but this doesn't wake her. States in the morning with waking pad is usually saturated but about once weekly is dry Pads: Yes: 1x daily and 1x with waking . Wears night time at night and lighter during day  INTERCOURSE: Pain with intercourse:  not active    PREGNANCY: Vaginal deliveries 2 Tearing Yes: episiotomy  C-section deliveries 0 Currently pregnant No  PROLAPSE: None   OBJECTIVE:  Note: Objective measures were completed at Evaluation unless otherwise noted.  DIAGNOSTIC FINDINGS:    COGNITION: Overall cognitive status: Within functional limits for tasks assessed     SENSATION: Light touch: Appears intact Proprioception: Appears intact  MUSCLE LENGTH: Bil hamstrings and adductors limited by 50%   GAIT: WFL  POSTURE: rounded shoulders, forward head, and posterior pelvic tilt  PELVIC ALIGNMENT:WFL  LUMBARAROM/PROM:  A/PROM A/PROM  eval  Flexion Limited by 50%  Extension Limited by 25%  Right lateral flexion Limited by 50%  Left lateral flexion Limited by 50%  Right rotation Limited by 50%  Left rotation Limited by 50%   (Blank rows = not tested)  LOWER EXTREMITY ROM: WFL  LOWER EXTREMITY MMT:  Bil hips grossly 3+/5; knees 5/5 PALPATION:   General  no TTP, tightness at lumbar spine and bil gluteals                External Perineal Exam WFL, no TTP                             Internal Pelvic Floor no TTP  Patient confirms identification and approves PT to assess internal pelvic floor and treatment Yes No emotional/communication barriers or cognitive limitation. Patient is motivated to learn. Patient understands and agrees with treatment goals and plan. PT explains patient will be examined in standing, sitting, and lying down to see how their muscles and joints work. When they are ready, they will be asked to remove their underwear so PT can examine their perineum. The patient is also  given the option of providing their own chaperone as one is not provided in our facility. The patient also has the right and is explained the right to defer or refuse any part of the evaluation or treatment including the internal exam. With the patient's consent, PT will use one gloved finger to gently assess the muscles of the pelvic floor, seeing how well it contracts and relaxes and if there is muscle symmetry. After, the patient will get dressed and PT and patient will discuss exam findings and plan of care. PT and patient discuss plan of care, schedule, attendance policy and HEP activities.   PELVIC MMT:   MMT eval  Vaginal 3/5 improved to 4/5 with cues for technique and breathing coordination; 1s; 4 reps  Internal Anal Sphincter   External Anal Sphincter   Puborectalis   Diastasis Recti   (Blank rows = not tested)        TONE: WFL  PROLAPSE: Not seen in hooklying with cough however limited visualization with body habitus   TODAY'S TREATMENT:  DATE:   06/19/23:  NuStep 8 mins L5 PT present to discuss progress NMRE: all exercises for exhale and pelvic floor contraction with activity for improved coordination of muscle activation and decreased stress at pelvic floor for decreased leakage.   Bridges 2x10 Red band hip abductions 2x10 hooklying  Hooklying marching red band 2x10 2x10 Sit to stand 7# 150' 7#+5# and 36' 5# farmers carry  08/09/23: Reviewed urge drill, educated on voiding mechanics and abdominal massage for improved bowel evacuation and decreased leakage - handout given Nustep L6 for 4 mins and L4 3 mins PT present to discuss progress X10 seated pelvic floor  contractions 2x10 quick flicks X10 5s isometrics  08/24/23: Nustep L5 for PT present to discuss progress Seated pelvic floor contractions x10 with exhale Seated 5-8s isometric  pelvic floor contractions siting Standing x10 pelvic floor contractions 2x10 quick flicks 2x5 Sit to stand from mat table with 8# exhale and pelvic floor contraction  PATIENT EDUCATION:  Education details: [id number] Person educated: Patient Education method: Explanation, Demonstration, Tactile cues, Verbal cues, and Handouts Education comprehension: verbalized understanding and returned demonstration  HOME EXERCISE PROGRAM: [id number]  ASSESSMENT:  CLINICAL IMPRESSION: Patient presents for treatment today, focus on hip and core strengthening for improved pelvic floor strengthening and coordination. Pt tolerated well and reports improvements in symptoms but not resolved, has had a decline in how often she has been able to practice with being busy at holidays but states she will be more active with recommendations, HEP this week.  OBJECTIVE IMPAIRMENTS: decreased activity tolerance, decreased coordination, decreased endurance, decreased mobility, decreased strength, increased muscle spasms, impaired flexibility, improper body mechanics, and postural dysfunction.   ACTIVITY LIMITATIONS: carrying, lifting, squatting, and continence  PARTICIPATION LIMITATIONS: community activity  PERSONAL FACTORS: Time since onset of injury/illness/exacerbation and 1 comorbidity: medical history  are also affecting patient's functional outcome.   REHAB POTENTIAL: Good  CLINICAL DECISION MAKING: Stable/uncomplicated  EVALUATION COMPLEXITY: Low   GOALS: Goals reviewed with patient? Yes  SHORT TERM GOALS: Target date: 06/21/23  Pt to be I with HEP.  Baseline: Goal status:MET  2.  Pt will have 25% less urgency due to bladder retraining and strengthening  Baseline:  Goal status: MET  3.  Pt to demonstrate at least 4/5 pelvic floor strength with at least 5s isometric for improved pelvic stability and decreased strain at pelvic floor/ decrease leakage.  Baseline:  Goal status: on going  4.  Pt to  demonstrate improved coordination of pelvic floor and breathing mechanics with body weight squat with appropriate synergistic patterns to decrease pain and leakage at least 50% of the time.    Baseline:  Goal status: on going   LONG TERM GOALS: Target date: 07/24/23  Pt to be I with advanced HEP.  Baseline:  Goal status: on going  2.  Pt will have 50% less urgency due to bladder retraining and strengthening  Baseline:  Goal status: MET  3.  Pt to demonstrate at least 4/5 pelvic floor strength with 8s isometric for improved pelvic stability and decreased strain at pelvic floor/ decrease leakage.  Baseline:  Goal status: on going  4.  Pt to demonstrate improved coordination of pelvic floor and breathing mechanics with 10# squat with appropriate synergistic patterns to decrease pain and leakage at least 75% of the time.    Baseline:  Goal status: on going  5.  Pt to report no more than one instance or urinary leakage per day to decrease symptoms and improve QOL  Baseline:  Goal status: MET    PLAN:  PT FREQUENCY: 1x/week  PT DURATION:  8 sessions  PLANNED INTERVENTIONS: Therapeutic exercises, Therapeutic activity, Neuromuscular re-education, Patient/Family education, Self Care, Joint mobilization, Aquatic Therapy, Dry Needling, Spinal mobilization, Cryotherapy, Moist heat, scar mobilization, Taping, Biofeedback, Manual therapy, and Re-evaluation  PLAN FOR NEXT SESSION: coordination of pelvic floor and breathing with and without exercises, core and hip and pelvic floor strengthening, breathing mechanics, pressure management, knack    Darryle Navy, PT, DPT 01/02/254:13 PM  [facility] [address] [place], [place] [address] Phone # [phone] Fax [phone]

## 2023-08-30 ENCOUNTER — Ambulatory Visit: Payer: Medicare Other | Admitting: Physical Therapy

## 2023-08-30 DIAGNOSIS — R293 Abnormal posture: Secondary | ICD-10-CM | POA: Diagnosis not present

## 2023-08-30 DIAGNOSIS — R279 Unspecified lack of coordination: Secondary | ICD-10-CM

## 2023-08-30 DIAGNOSIS — M6281 Muscle weakness (generalized): Secondary | ICD-10-CM

## 2023-08-30 DIAGNOSIS — M62838 Other muscle spasm: Secondary | ICD-10-CM | POA: Diagnosis not present

## 2023-08-30 NOTE — Therapy (Signed)
 OUTPATIENT PHYSICAL THERAPY FEMALE PELVIC TREATMENT   Patient Name: Wendy Figueroa MRN: [id number] DOB:[birth year], 78 y.o., female Today's Date: 08/30/2023  END OF SESSION:  PT End of Session - 08/30/23 1421     Visit Number 8    Date for PT Re-Evaluation 10/10/23    Authorization Type [employer] MCR    Authorization Time Period [employer] Approved 8 -05/24/2023-07/16/2023 [id number]    Authorization - Visit Number 7    Authorization - Number of Visits 8    Progress Note Due on Visit 10    PT Start Time 1403    PT Stop Time 1444    PT Time Calculation (min) 41 min    Activity Tolerance Patient tolerated treatment well    Behavior During Therapy WFL for tasks assessed/performed              Past Medical History:  Diagnosis Date   Abnormal Pap smear    Over 41yrs ago   ALKALINE PHOSPHATASE, ELEVATED 07/01/2009   Qualifier: Diagnosis of  By: [name], [name]    Allergic rhinitis    Anemia    Arthritis    Asthma    Cataract    bilateral implants   Cough    occasional; 9-21: reports as ongoing cough , managing with cough medicine and scheduled and prn inhalers, reports mucus is clear and scant     Cystocele 11/05/08   Diverticulosis of colon    DJD (degenerative joint disease)    no per pt   Elevated alkaline phosphatase level    GERD (gastroesophageal reflux disease)    H/O urinary incontinence 11/06/2008   H/O varicella    Hard measles    Hx of colonic polyps    Hypercholesterolemia    Hypertension    Leg swelling    occasional   LPRD (laryngopharyngeal reflux disease) 05/29/2015   Overweight(278.02)    Rectocele 11/06/2008   Large   Sebaceous cyst    on back   Sinusitis    Venous insufficiency    Wheezing    occasional   Yeast infection    Past Surgical History:  Procedure Laterality Date   ABDOMINAL HYSTERECTOMY  1988   ANTERIOR AND POSTERIOR REPAIR  11/05/08   ANTERIOR AND POSTERIOR REPAIR N/A 05/15/2019   Procedure: ANTERIOR (CYSTOCELE) AND POSTERIOR  REPAIR (RECTOCELE);  Surgeon: [name], MD;  Location: [facility];  Service: Gynecology;  Laterality: N/A;   APOGEE / PERIGEE REPAIR  10/2008   Dr. [name]   BLADDER SUSPENSION N/A 05/15/2019   Procedure: TRANSVAGINAL TAPE (TVT) PROCEDURE;  Surgeon: [name], MD;  Location: [facility];  Service: Gynecology;  Laterality: N/A;   bladder tack  2011   BREAST SURGERY  1978   cyst removed from both removed   cataracts   approx 10 years   Dr [name] , ctaract with lens placement    COLONOSCOPY     CYSTOSCOPY N/A 05/15/2019   Procedure: CYSTOSCOPY;  Surgeon: [name], MD;  Location: [facility];  Service: Gynecology;  Laterality: N/A;   ESOPHAGOGASTRODUODENOSCOPY     Patient Active Problem List   Diagnosis Date Noted   ILD (interstitial lung disease) (HCC) 07/13/2020   Abnormal cervical Papanicolaou smear 07/12/2019   Urinary urgency 07/12/2019   Urinary incontinence 05/15/2019   Cystocele with rectocele 05/15/2019   Pain due to onychomycosis of toenails of both feet 02/20/2019   Adjustment disorder with anxious mood 06/08/2018   History  of pulmonary embolism 06/05/2018   Pharyngoesophageal dysphagia 04/18/2016   Alkaline phosphatase elevation 05/29/2015   Asthma, mild intermittent 05/29/2015   Other allergic rhinitis 05/29/2015   LPRD (laryngopharyngeal reflux disease) 05/29/2015   Chronic cough 05/29/2015   Dysphonia 05/29/2015   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   Globus sensation 04/02/2015   Osteopenia 07/08/2014   Hypokalemia 06/04/2014   Hyperlipidemia LDL goal <130 06/04/2014   DEGENERATIVE JOINT DISEASE 07/01/2009   Disorder of bone and cartilage 07/01/2009   INSOMNIA 08/11/2008   HYPERCHOLESTEROLEMIA, BORDERLINE 03/20/2008   Overweight 03/20/2008   Allergic rhinitis 03/20/2008   DIVERTICULOSIS OF COLON 03/20/2008   COLONIC POLYPS 09/27/2007   Essential hypertension 09/27/2007   Venous  (peripheral) insufficiency 09/27/2007   Asthma 09/27/2007   GERD 09/27/2007    PCP: Harlene An, NP  REFERRING PROVIDER: [name], MD  REFERRING DIAG: R32 (ICD-10-CM) - Unspecified urinary incontinence  THERAPY DIAG:  Muscle weakness (generalized)  Unspecified lack of coordination  Abnormal posture  Rationale for Evaluation and Treatment: Rehabilitation  ONSET DATE: years  SUBJECTIVE:                                                                                                                                                                                           SUBJECTIVE STATEMENT: Symptoms about the same as last week, can't get there quick enough sometimes. The urge drill helps but doesn't stop it fully. Pt reports she feels tired today, had to go to the grocery store before this appt.  Fluid intake: Yes: water - 5-6 8oz cups; coffee on Mondays and Fridays; 1-2 sodas a week    PAIN:  Are you having pain? No NPRS scale: 0/10 Pain location:  na  Pain type:na Pain description: na  Aggravating factors: na Relieving factors: na  PRECAUTIONS: None  RED FLAGS: None   WEIGHT BEARING RESTRICTIONS: No  FALLS:  Has patient fallen in last 6 months? No  LIVING ENVIRONMENT: Lives with: lives with their family Lives in: House/apartment   OCCUPATION: retired   PLOF: Independent  PATIENT GOALS: to have less leakage   PERTINENT HISTORY:  Hysterectomy, rectocele and cystocele repair.  Sexual abuse: No  BOWEL MOVEMENT: Pain with bowel movement: No Type of bowel movement:Type (Bristol Stool Scale) 4, Frequency every other day, and Strain No Fully empty rectum: No not all the time Leakage: Yes: after having bowel movement  Pads: Yes: 1 change daily Fiber supplement: Yes: colace daily  URINATION: Pain with urination: No Fully empty bladder: Yes:   Stream: Strong and Weak Urgency: Yes:   Frequency: faster with lasix  but without much longer   Leakage:  Urge to void, Walking to the bathroom, Coughing, Sneezing, and Laughing and at night while sleeping thinks she leakage with turning over in bed but this doesn't wake her. States in the morning with waking pad is usually saturated but about once weekly is dry Pads: Yes: 1x daily and 1x with waking . Wears night time at night and lighter during day  INTERCOURSE: Pain with intercourse:  not active    PREGNANCY: Vaginal deliveries 2 Tearing Yes: episiotomy  C-section deliveries 0 Currently pregnant No  PROLAPSE: None   OBJECTIVE:  Note: Objective measures were completed at Evaluation unless otherwise noted.  DIAGNOSTIC FINDINGS:    COGNITION: Overall cognitive status: Within functional limits for tasks assessed     SENSATION: Light touch: Appears intact Proprioception: Appears intact  MUSCLE LENGTH: Bil hamstrings and adductors limited by 50%   GAIT: WFL  POSTURE: rounded shoulders, forward head, and posterior pelvic tilt  PELVIC ALIGNMENT:WFL  LUMBARAROM/PROM:  A/PROM A/PROM  eval  Flexion Limited by 50%  Extension Limited by 25%  Right lateral flexion Limited by 50%  Left lateral flexion Limited by 50%  Right rotation Limited by 50%  Left rotation Limited by 50%   (Blank rows = not tested)  LOWER EXTREMITY ROM: WFL  LOWER EXTREMITY MMT:  Bil hips grossly 3+/5; knees 5/5 PALPATION:   General  no TTP, tightness at lumbar spine and bil gluteals                External Perineal Exam WFL, no TTP                             Internal Pelvic Floor no TTP  Patient confirms identification and approves PT to assess internal pelvic floor and treatment Yes No emotional/communication barriers or cognitive limitation. Patient is motivated to learn. Patient understands and agrees with treatment goals and plan. PT explains patient will be examined in standing, sitting, and lying down to see how their muscles and joints work. When they are ready, they will be  asked to remove their underwear so PT can examine their perineum. The patient is also given the option of providing their own chaperone as one is not provided in our facility. The patient also has the right and is explained the right to defer or refuse any part of the evaluation or treatment including the internal exam. With the patient's consent, PT will use one gloved finger to gently assess the muscles of the pelvic floor, seeing how well it contracts and relaxes and if there is muscle symmetry. After, the patient will get dressed and PT and patient will discuss exam findings and plan of care. PT and patient discuss plan of care, schedule, attendance policy and HEP activities.   PELVIC MMT:   MMT eval  Vaginal 3/5 improved to 4/5 with cues for technique and breathing coordination; 1s; 4 reps  Internal Anal Sphincter   External Anal Sphincter   Puborectalis   Diastasis Recti   (Blank rows = not tested)        TONE: WFL  PROLAPSE: Not seen in hooklying with cough however limited visualization with body habitus   TODAY'S TREATMENT:  DATE:   08/09/23: Reviewed urge drill, educated on voiding mechanics and abdominal massage for improved bowel evacuation and decreased leakage - handout given Nustep L6 for 4 mins and L4 3 mins PT present to discuss progress X10 seated pelvic floor  contractions 2x10 quick flicks X10 5s isometrics  08/24/23: Nustep L5 for PT present to discuss progress Seated pelvic floor contractions x10 with exhale Seated 5-8s isometric pelvic floor contractions siting Standing x10 pelvic floor contractions 2x10 quick flicks 2x5 Sit to stand from mat table with 8# exhale and pelvic floor contraction  08/30/23  Nustep L for PT present to discuss progress 3# ankle weights standing hip flexion, ext, abduction x10 each 8# Sit to stand  x10  Seated pelvic floor contractions 2x10, x10 8-10s iso Farmers carry 5# 750' cues for posture and core activation  PATIENT EDUCATION:  Education details: [id number] Person educated: Patient Education method: Explanation, Demonstration, Tactile cues, Verbal cues, and Handouts Education comprehension: verbalized understanding and returned demonstration  HOME EXERCISE PROGRAM: [id number]  ASSESSMENT:  CLINICAL IMPRESSION: Patient presents for treatment today, focus on hip and core strengthening for improved pelvic floor strengthening and coordination. Pt tolerated well and reports improvements in symptoms but not resolved. Does continue to have symptoms, similar to last week, and states she can tell she is getting better. Pt educated on urge drill variation for counting backwards from 10 to further assist ability to hold urine and decreased leakage. Pt would benefit from additional PT to further address deficits.    OBJECTIVE IMPAIRMENTS: decreased activity tolerance, decreased coordination, decreased endurance, decreased mobility, decreased strength, increased muscle spasms, impaired flexibility, improper body mechanics, and postural dysfunction.   ACTIVITY LIMITATIONS: carrying, lifting, squatting, and continence  PARTICIPATION LIMITATIONS: community activity  PERSONAL FACTORS: Time since onset of injury/illness/exacerbation and 1 comorbidity: medical history  are also affecting patient's functional outcome.   REHAB POTENTIAL: Good  CLINICAL DECISION MAKING: Stable/uncomplicated  EVALUATION COMPLEXITY: Low   GOALS: Goals reviewed with patient? Yes  SHORT TERM GOALS: Target date: 06/21/23  Pt to be I with HEP.  Baseline: Goal status:MET  2.  Pt will have 25% less urgency due to bladder retraining and strengthening  Baseline:  Goal status: MET  3.  Pt to demonstrate at least 4/5 pelvic floor strength with at least 5s isometric for improved pelvic stability and decreased  strain at pelvic floor/ decrease leakage.  Baseline:  Goal status: on going  4.  Pt to demonstrate improved coordination of pelvic floor and breathing mechanics with body weight squat with appropriate synergistic patterns to decrease pain and leakage at least 50% of the time.    Baseline:  Goal status: on going   LONG TERM GOALS: Target date: 07/24/23  Pt to be I with advanced HEP.  Baseline:  Goal status: on going  2.  Pt will have 50% less urgency due to bladder retraining and strengthening  Baseline:  Goal status: MET  3.  Pt to demonstrate at least 4/5 pelvic floor strength with 8s isometric for improved pelvic stability and decreased strain at pelvic floor/ decrease leakage.  Baseline:  Goal status: on going  4.  Pt to demonstrate improved coordination of pelvic floor and breathing mechanics with 10# squat with appropriate synergistic patterns to decrease pain and leakage at least 75% of the time.    Baseline:  Goal status: on going  5.  Pt to report no more than one instance or urinary leakage per day to decrease symptoms and improve  QOL Baseline:  Goal status: on going    PLAN:  PT FREQUENCY: 1x/week  PT DURATION:  8 sessions  PLANNED INTERVENTIONS: Therapeutic exercises, Therapeutic activity, Neuromuscular re-education, Patient/Family education, Self Care, Joint mobilization, Aquatic Therapy, Dry Needling, Spinal mobilization, Cryotherapy, Moist heat, scar mobilization, Taping, Biofeedback, Manual therapy, and Re-evaluation  PLAN FOR NEXT SESSION: coordination of pelvic floor and breathing with and without exercises, core and hip and pelvic floor strengthening, breathing mechanics, pressure management, knack    Darryle Navy, PT, DPT 01/08/252:44 PM  [facility] [address] [place], [place] [address] Phone # [phone] Fax [phone]

## 2023-09-06 ENCOUNTER — Ambulatory Visit: Payer: Medicare Other | Admitting: Physical Therapy

## 2023-09-06 DIAGNOSIS — J3089 Other allergic rhinitis: Secondary | ICD-10-CM | POA: Diagnosis not present

## 2023-09-06 DIAGNOSIS — R293 Abnormal posture: Secondary | ICD-10-CM | POA: Diagnosis not present

## 2023-09-06 DIAGNOSIS — R279 Unspecified lack of coordination: Secondary | ICD-10-CM | POA: Diagnosis not present

## 2023-09-06 DIAGNOSIS — M6281 Muscle weakness (generalized): Secondary | ICD-10-CM

## 2023-09-06 DIAGNOSIS — J3081 Allergic rhinitis due to animal (cat) (dog) hair and dander: Secondary | ICD-10-CM | POA: Diagnosis not present

## 2023-09-06 DIAGNOSIS — M62838 Other muscle spasm: Secondary | ICD-10-CM | POA: Diagnosis not present

## 2023-09-06 DIAGNOSIS — J301 Allergic rhinitis due to pollen: Secondary | ICD-10-CM | POA: Diagnosis not present

## 2023-09-06 NOTE — Therapy (Addendum)
 OUTPATIENT PHYSICAL THERAPY FEMALE PELVIC TREATMENT   Patient Name: Wendy Figueroa MRN: [id number] DOB:1946/05/14, 78 y.o., female Today's Date: 09/06/2023  END OF SESSION:  PT End of Session - 09/06/23 1550     Visit Number 9    Date for PT Re-Evaluation 10/10/23    Authorization Type [employer] MCR    Authorization Time Period [employer] Approved 8 -05/24/2023-07/16/2023 [id number]    Authorization - Visit Number 8    Authorization - Number of Visits 8    Progress Note Due on Visit 10    PT Start Time 1400    PT Stop Time 1443    PT Time Calculation (min) 43 min    Activity Tolerance Patient tolerated treatment well    Behavior During Therapy WFL for tasks assessed/performed               Past Medical History:  Diagnosis Date   Abnormal Pap smear    Over 28yrs ago   ALKALINE PHOSPHATASE, ELEVATED 07/01/2009   Qualifier: Diagnosis of  By: Kriste Basque MD, Lonzo Cloud    Allergic rhinitis    Anemia    Arthritis    Asthma    Cataract    bilateral implants   Cough    occasional; 9-21: reports as ongoing cough , managing with cough medicine and scheduled and prn inhalers, reports mucus is clear and scant     Cystocele 11/05/08   Diverticulosis of colon    DJD (degenerative joint disease)    no per pt   Elevated alkaline phosphatase level    GERD (gastroesophageal reflux disease)    H/O urinary incontinence 11/06/2008   H/O varicella    Hard measles    Hx of colonic polyps    Hypercholesterolemia    Hypertension    Leg swelling    occasional   LPRD (laryngopharyngeal reflux disease) 05/29/2015   Overweight(278.02)    Rectocele 11/06/2008   Large   Sebaceous cyst    on back   Sinusitis    Venous insufficiency    Wheezing    occasional   Yeast infection    Past Surgical History:  Procedure Laterality Date   ABDOMINAL HYSTERECTOMY  1988   ANTERIOR AND POSTERIOR REPAIR  11/05/08   ANTERIOR AND POSTERIOR REPAIR N/A 05/15/2019   Procedure: ANTERIOR (CYSTOCELE) AND POSTERIOR  REPAIR (RECTOCELE);  Surgeon: Osborn Coho, MD;  Location: [facility];  Service: Gynecology;  Laterality: N/A;   APOGEE / PERIGEE REPAIR  10/2008   Dr. Lance Morin   BLADDER SUSPENSION N/A 05/15/2019   Procedure: TRANSVAGINAL TAPE (TVT) PROCEDURE;  Surgeon: Osborn Coho, MD;  Location: [facility];  Service: Gynecology;  Laterality: N/A;   bladder tack  2011   BREAST SURGERY  1978   cyst removed from both removed   cataracts   approx 10 years   Dr Ninetta Lights , ctaract with lens placement    COLONOSCOPY     CYSTOSCOPY N/A 05/15/2019   Procedure: CYSTOSCOPY;  Surgeon: Osborn Coho, MD;  Location: [facility];  Service: Gynecology;  Laterality: N/A;   ESOPHAGOGASTRODUODENOSCOPY     Patient Active Problem List   Diagnosis Date Noted   ILD (interstitial lung disease) (HCC) 07/13/2020   Abnormal cervical Papanicolaou smear 07/12/2019   Urinary urgency 07/12/2019   Urinary incontinence 05/15/2019   Cystocele with rectocele 05/15/2019   Pain due to onychomycosis of toenails of both feet 02/20/2019   Adjustment disorder with anxious mood 06/08/2018  History of pulmonary embolism 06/05/2018   Pharyngoesophageal dysphagia 04/18/2016   Alkaline phosphatase elevation 05/29/2015   Asthma, mild intermittent 05/29/2015   Other allergic rhinitis 05/29/2015   LPRD (laryngopharyngeal reflux disease) 05/29/2015   Chronic cough 05/29/2015   Dysphonia 05/29/2015   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   Globus sensation 04/02/2015   Osteopenia 07/08/2014   Hypokalemia 06/04/2014   Hyperlipidemia LDL goal <130 06/04/2014   DEGENERATIVE JOINT DISEASE 07/01/2009   Disorder of bone and cartilage 07/01/2009   INSOMNIA 08/11/2008   HYPERCHOLESTEROLEMIA, BORDERLINE 03/20/2008   Overweight 03/20/2008   Allergic rhinitis 03/20/2008   DIVERTICULOSIS OF COLON 03/20/2008   COLONIC POLYPS 09/27/2007   Essential hypertension 09/27/2007   Venous  (peripheral) insufficiency 09/27/2007   Asthma 09/27/2007   GERD 09/27/2007    PCP: Abbey Chatters, NP  REFERRING PROVIDER: Osborn Coho, MD  REFERRING DIAG: R32 (ICD-10-CM) - Unspecified urinary incontinence  THERAPY DIAG:  Muscle weakness (generalized)  Unspecified lack of coordination  Abnormal posture  Rationale for Evaluation and Treatment: Rehabilitation  ONSET DATE: years  SUBJECTIVE:                                                                                                                                                                                           SUBJECTIVE STATEMENT: Has noted improvement with paying attention to leakage, no leakage without coughing now. If coughing will need to change pad.   Fluid intake: Yes: water - 5-6 8oz cups; coffee on Mondays and Fridays; 1-2 sodas a week    PAIN:  Are you having pain? No NPRS scale: 0/10 Pain location:  na  Pain type:na Pain description: na  Aggravating factors: na Relieving factors: na  PRECAUTIONS: None  RED FLAGS: None   WEIGHT BEARING RESTRICTIONS: No  FALLS:  Has patient fallen in last 6 months? No  LIVING ENVIRONMENT: Lives with: lives with their family Lives in: House/apartment   OCCUPATION: retired   PLOF: Independent  PATIENT GOALS: to have less leakage   PERTINENT HISTORY:  Hysterectomy, rectocele and cystocele repair.  Sexual abuse: No  BOWEL MOVEMENT: Pain with bowel movement: No Type of bowel movement:Type (Bristol Stool Scale) 4, Frequency every other day, and Strain No Fully empty rectum: No not all the time Leakage: Yes: after having bowel movement  Pads: Yes: 1 change daily Fiber supplement: Yes: colace daily  URINATION: Pain with urination: No Fully empty bladder: Yes:   Stream: Strong and Weak Urgency: Yes:   Frequency: faster with lasix but without much longer  Leakage: Urge to void, Walking to the bathroom, Coughing, Sneezing, and Laughing and  at night while sleeping  thinks she leakage with turning over in bed but this doesn't wake her. States in the morning with waking pad is usually saturated but about once weekly is dry Pads: Yes: 1x daily and 1x with waking . Wears night time at night and lighter during day  INTERCOURSE: Pain with intercourse:  not active    PREGNANCY: Vaginal deliveries 2 Tearing Yes: episiotomy  C-section deliveries 0 Currently pregnant No  PROLAPSE: None   OBJECTIVE:  Note: Objective measures were completed at Evaluation unless otherwise noted.  DIAGNOSTIC FINDINGS:    COGNITION: Overall cognitive status: Within functional limits for tasks assessed     SENSATION: Light touch: Appears intact Proprioception: Appears intact  MUSCLE LENGTH: Bil hamstrings and adductors limited by 50%   GAIT: WFL  POSTURE: rounded shoulders, forward head, and posterior pelvic tilt  PELVIC ALIGNMENT:WFL  LUMBARAROM/PROM:  A/PROM A/PROM  eval  Flexion Limited by 50%  Extension Limited by 25%  Right lateral flexion Limited by 50%  Left lateral flexion Limited by 50%  Right rotation Limited by 50%  Left rotation Limited by 50%   (Blank rows = not tested)  LOWER EXTREMITY ROM: WFL  LOWER EXTREMITY MMT:  Bil hips grossly 3+/5; knees 5/5 PALPATION:   General  no TTP, tightness at lumbar spine and bil gluteals                External Perineal Exam WFL, no TTP                             Internal Pelvic Floor no TTP  Patient confirms identification and approves PT to assess internal pelvic floor and treatment Yes No emotional/communication barriers or cognitive limitation. Patient is motivated to learn. Patient understands and agrees with treatment goals and plan. PT explains patient will be examined in standing, sitting, and lying down to see how their muscles and joints work. When they are ready, they will be asked to remove their underwear so PT can examine their perineum. The patient is also  given the option of providing their own chaperone as one is not provided in our facility. The patient also has the right and is explained the right to defer or refuse any part of the evaluation or treatment including the internal exam. With the patient's consent, PT will use one gloved finger to gently assess the muscles of the pelvic floor, seeing how well it contracts and relaxes and if there is muscle symmetry. After, the patient will get dressed and PT and patient will discuss exam findings and plan of care. PT and patient discuss plan of care, schedule, attendance policy and HEP activities.   PELVIC MMT:   MMT eval  Vaginal 3/5 improved to 4/5 with cues for technique and breathing coordination; 1s; 4 reps  Internal Anal Sphincter   External Anal Sphincter   Puborectalis   Diastasis Recti   (Blank rows = not tested)        TONE: WFL  PROLAPSE: Not seen in hooklying with cough however limited visualization with body habitus   TODAY'S TREATMENT:  DATE:    08/24/23: Nustep L5 for PT present to discuss progress Seated pelvic floor contractions x10 with exhale Seated 5-8s isometric pelvic floor contractions siting Standing x10 pelvic floor contractions 2x10 quick flicks 2x5 Sit to stand from mat table with 8# exhale and pelvic floor contraction  08/30/23  Nustep L5 for PT present to discuss progress 3# ankle weights standing hip flexion, ext, abduction x10 each 8# Sit to stand x10  Seated pelvic floor contractions 2x10, x10 8-10s iso Farmers carry 5# 750' cues for posture and core activation  09/06/23  Nustep L5 for PT present to discuss progress NMRE: all exercises for exhale and pelvic floor contraction with activity for improved coordination of muscle activation and decreased stress at pelvic floor for decreased leakage.  2x10 bridges   Opp hand/knee press 2x10 2x10 Sit to stand 10# X10 bil OHP 3# Farmer's carry   PATIENT EDUCATION:  Education details: [id number] Person educated: Patient Education method: Explanation, Demonstration, Tactile cues, Verbal cues, and Handouts Education comprehension: verbalized understanding and returned demonstration  HOME EXERCISE PROGRAM: [id number]  ASSESSMENT:  CLINICAL IMPRESSION: Patient presents for treatment today, focus on hip and core strengthening for improved pelvic floor strengthening and coordination. Pt tolerated well and reports improvements in symptoms but not resolved. Pt reports no longer having urge urinary incontinence, only with coughing now and states she feels "70% better". Pt pleased with progress and would like to continue for a few weeks to make sure she is able to maintain and self progress. Pt would benefit from additional PT to further address deficits.    OBJECTIVE IMPAIRMENTS: decreased activity tolerance, decreased coordination, decreased endurance, decreased mobility, decreased strength, increased muscle spasms, impaired flexibility, improper body mechanics, and postural dysfunction.   ACTIVITY LIMITATIONS: carrying, lifting, squatting, and continence  PARTICIPATION LIMITATIONS: community activity  PERSONAL FACTORS: Time since onset of injury/illness/exacerbation and 1 comorbidity: medical history  are also affecting patient's functional outcome.   REHAB POTENTIAL: Good  CLINICAL DECISION MAKING: Stable/uncomplicated  EVALUATION COMPLEXITY: Low   GOALS: Goals reviewed with patient? Yes  SHORT TERM GOALS: Target date: 06/21/23  Pt to be I with HEP.  Baseline: Goal status:MET  2.  Pt will have 25% less urgency due to bladder retraining and strengthening  Baseline:  Goal status: MET  3.  Pt to demonstrate at least 4/5 pelvic floor strength with at least 5s isometric for improved pelvic stability and decreased strain at pelvic floor/ decrease  leakage.  Baseline:  Goal status: on going  4.  Pt to demonstrate improved coordination of pelvic floor and breathing mechanics with body weight squat with appropriate synergistic patterns to decrease pain and leakage at least 50% of the time.    Baseline:  Goal status: MET   LONG TERM GOALS: Target date: 07/24/23  Pt to be I with advanced HEP.  Baseline:  Goal status: MET  2.  Pt will have 50% less urgency due to bladder retraining and strengthening  Baseline:  Goal status: MET  3.  Pt to demonstrate at least 4/5 pelvic floor strength with 8s isometric for improved pelvic stability and decreased strain at pelvic floor/ decrease leakage.  Baseline:  Goal status: on going  4.  Pt to demonstrate improved coordination of pelvic floor and breathing mechanics with 10# squat with appropriate synergistic patterns to decrease pain and leakage at least 75% of the time.    Baseline:  Goal status: MET  5.  Pt to report no more than one  instance or urinary leakage per day to decrease symptoms and improve QOL Baseline:  Goal status: MET    PLAN:  PT FREQUENCY: 1x/week  PT DURATION:  8 sessions  PLANNED INTERVENTIONS: Therapeutic exercises, Therapeutic activity, Neuromuscular re-education, Patient/Family education, Self Care, Joint mobilization, Aquatic Therapy, Dry Needling, Spinal mobilization, Cryotherapy, Moist heat, scar mobilization, Taping, Biofeedback, Manual therapy, and Re-evaluation  PLAN FOR NEXT SESSION: coordination of pelvic floor and breathing with and without exercises, core and hip and pelvic floor strengthening, breathing mechanics, pressure management, knack    Otelia Sergeant, PT, DPT 01/15/253:52 PM  [facility] [address] [place], [place] [address] Phone # [phone] Fax [phone]   PHYSICAL THERAPY DISCHARGE SUMMARY  Visits from Start of Care: 9  Current functional level related to goals / functional  outcomes: Unable to formally reassess, pt did not make additional appointments    Remaining deficits: improving   Education / Equipment: HEP   Patient agrees to discharge. Patient goals were partially met. Patient is being discharged due to not returning since the last visit.  Otelia Sergeant, PT, DPT 11/21/2508:12 AM

## 2023-09-15 ENCOUNTER — Other Ambulatory Visit: Payer: Self-pay | Admitting: Nurse Practitioner

## 2023-09-22 DIAGNOSIS — J3081 Allergic rhinitis due to animal (cat) (dog) hair and dander: Secondary | ICD-10-CM | POA: Diagnosis not present

## 2023-09-22 DIAGNOSIS — J301 Allergic rhinitis due to pollen: Secondary | ICD-10-CM | POA: Diagnosis not present

## 2023-09-22 DIAGNOSIS — J3089 Other allergic rhinitis: Secondary | ICD-10-CM | POA: Diagnosis not present

## 2023-09-28 ENCOUNTER — Ambulatory Visit: Payer: Medicare Other | Admitting: Podiatry

## 2023-09-28 ENCOUNTER — Encounter: Payer: Self-pay | Admitting: Podiatry

## 2023-09-28 DIAGNOSIS — M79675 Pain in left toe(s): Secondary | ICD-10-CM

## 2023-09-28 DIAGNOSIS — M79674 Pain in right toe(s): Secondary | ICD-10-CM

## 2023-09-28 DIAGNOSIS — I872 Venous insufficiency (chronic) (peripheral): Secondary | ICD-10-CM | POA: Diagnosis not present

## 2023-09-28 DIAGNOSIS — B351 Tinea unguium: Secondary | ICD-10-CM

## 2023-09-28 NOTE — Progress Notes (Signed)
This patient returns to my office for at risk foot care.  This patient requires this care by a professional since this patient will be at risk due to having venous insufficiency  This patient is unable to cut nails herself since the patient cannot reach her nails.These nails are painful walking and wearing shoes.  This patient presents for at risk foot care today.  General Appearance  Alert, conversant and in no acute stress.  Vascular  Dorsalis pedis and posterior tibial  pulses are palpable  bilaterally.  Capillary return is within normal limits  bilaterally. Temperature is within normal limits  bilaterally.  Neurologic  Senn-Weinstein monofilament wire test within normal limits  bilaterally. Muscle power within normal limits bilaterally.  Nails Thick disfigured discolored nails with subungual debris  from hallux to fifth toes bilaterally. No evidence of bacterial infection or drainage bilaterally.  Orthopedic  No limitations of motion  feet .  No crepitus or effusions noted.  HAV with hammer toes  B/L.  Skin  normotropic skin with no porokeratosis noted bilaterally.  No signs of infections or ulcers noted.     Onychomycosis  Pain in right toes  Pain in left toes  Consent was obtained for treatment procedures.   Mechanical debridement of nails 1-5  bilaterally performed with a nail nipper.  Filed with dremel without incident. No infection or ulcer.     Return office visit    12 weeks                Told patient to return for periodic foot care and evaluation due to potential at risk complications.   Hiro Vipond DPM  

## 2023-09-29 ENCOUNTER — Encounter: Payer: Medicare Other | Admitting: Nurse Practitioner

## 2023-10-02 LAB — HEMOGLOBIN A1C: Hemoglobin A1C: 5.1

## 2023-10-04 ENCOUNTER — Encounter: Payer: Medicare Other | Admitting: Nurse Practitioner

## 2023-10-06 DIAGNOSIS — J3081 Allergic rhinitis due to animal (cat) (dog) hair and dander: Secondary | ICD-10-CM | POA: Diagnosis not present

## 2023-10-06 DIAGNOSIS — J3089 Other allergic rhinitis: Secondary | ICD-10-CM | POA: Diagnosis not present

## 2023-10-06 DIAGNOSIS — J301 Allergic rhinitis due to pollen: Secondary | ICD-10-CM | POA: Diagnosis not present

## 2023-10-20 DIAGNOSIS — J301 Allergic rhinitis due to pollen: Secondary | ICD-10-CM | POA: Diagnosis not present

## 2023-10-20 DIAGNOSIS — J3089 Other allergic rhinitis: Secondary | ICD-10-CM | POA: Diagnosis not present

## 2023-10-20 DIAGNOSIS — J3081 Allergic rhinitis due to animal (cat) (dog) hair and dander: Secondary | ICD-10-CM | POA: Diagnosis not present

## 2023-10-21 ENCOUNTER — Other Ambulatory Visit: Payer: Self-pay | Admitting: Nurse Practitioner

## 2023-10-21 DIAGNOSIS — I10 Essential (primary) hypertension: Secondary | ICD-10-CM

## 2023-10-23 ENCOUNTER — Ambulatory Visit: Payer: Medicare Other | Admitting: Nurse Practitioner

## 2023-10-23 VITALS — BP 128/82 | HR 86 | Temp 97.1°F | Ht 62.08 in | Wt 220.0 lb

## 2023-10-23 DIAGNOSIS — Z0000 Encounter for general adult medical examination without abnormal findings: Secondary | ICD-10-CM

## 2023-10-23 NOTE — Patient Instructions (Signed)
  Ms. Girdler , Thank you for taking time to come for your Medicare Wellness Visit. I appreciate your ongoing commitment to your health goals. Please review the following plan we discussed and let me know if I can assist you in the future.     This is a list of the screening recommended for you and due dates:  Health Maintenance  Topic Date Due   COVID-19 Vaccine (8 - 2024-25 season) 05/22/2024*   Medicare Annual Wellness Visit  10/22/2024   DTaP/Tdap/Td vaccine (2 - Td or Tdap) 03/01/2027   Pneumonia Vaccine  Completed   Flu Shot  Completed   DEXA scan (bone density measurement)  Completed   Zoster (Shingles) Vaccine  Completed   HPV Vaccine  Aged Out   Colon Cancer Screening  Discontinued   Hepatitis C Screening  Discontinued  *Topic was postponed. The date shown is not the original due date.

## 2023-10-23 NOTE — Progress Notes (Signed)
 Subjective:   Wendy Figueroa is a 78 y.o. female who presents for Medicare Annual (Subsequent) preventive examination.  Visit Complete: In person  Cardiac Risk Factors include: obesity (BMI >30kg/m2);family history of premature cardiovascular disease;advanced age (>65men, >97 women);sedentary lifestyle;hypertension;dyslipidemia     Objective:    Today's Vitals   10/23/23 1055  BP: 128/82  Pulse: 86  Temp: (!) 97.1 F (36.2 C)  TempSrc: Temporal  SpO2: 98%  Weight: 220 lb (99.8 kg)  Height: 5' 2.08" (1.577 m)   Body mass index is 40.14 kg/m.     10/23/2023   10:58 AM 07/28/2023   11:05 AM 05/24/2023    2:54 PM 10/03/2022   11:37 AM 09/26/2022   10:23 AM 01/10/2022    9:27 AM 12/27/2021   10:22 AM  Advanced Directives  Does Patient Have a Medical Advance Directive? Yes Yes Yes Yes Yes Yes Yes  Type of Advance Directive Out of facility DNR (pink MOST or yellow form) Out of facility DNR (pink MOST or yellow form)  Living will;Out of facility DNR (pink MOST or yellow form) Out of facility DNR (pink MOST or yellow form) Out of facility DNR (pink MOST or yellow form) Out of facility DNR (pink MOST or yellow form)  Does patient want to make changes to medical advance directive? No - Patient declined No - Patient declined No - Patient declined No - Patient declined No - Patient declined No - Patient declined No - Patient declined  Pre-existing out of facility DNR order (yellow form or pink MOST form) Pink MOST form placed in chart (order not valid for inpatient use) Pink MOST form placed in chart (order not valid for inpatient use)   Pink MOST form placed in chart (order not valid for inpatient use) Pink MOST form placed in chart (order not valid for inpatient use) Pink MOST form placed in chart (order not valid for inpatient use)    Current Medications (verified) Outpatient Encounter Medications as of 10/23/2023  Medication Sig   albuterol (PROVENTIL HFA;VENTOLIN HFA) 108 (90 Base)  MCG/ACT inhaler Inhale 2 puffs into the lungs every 6 (six) hours as needed for wheezing or shortness of breath.   Calcium Carb-Cholecalciferol (CALCIUM 600 + D PO) Take 1 tablet by mouth 2 (two) times daily.    cetirizine (ZYRTEC) 10 MG tablet Take 10 mg by mouth daily.   chlorpheniramine-HYDROcodone (TUSSIONEX) 10-8 MG/5ML Take 5 mLs by mouth every 12 (twelve) hours as needed for cough.   Cholecalciferol (D3-1000) 25 MCG (1000 UT) capsule Take 1,000 Units by mouth daily.   docusate sodium (COLACE) 100 MG capsule Take 100 mg by mouth daily.   DULoxetine (CYMBALTA) 60 MG capsule TAKE 1 CAPSULE (60 MG TOTAL) BY MOUTH DAILY. DX (F43.22)   EPINEPHrine 0.3 mg/0.3 mL IJ SOAJ injection epinephrine 0.3 mg/0.3 mL injection, auto-injector   Fluticasone-Umeclidin-Vilant (TRELEGY ELLIPTA) 200-62.5-25 MCG/ACT AEPB Inhale 1 puff into the lungs daily.   furosemide (LASIX) 40 MG tablet TAKE 1 TABLET BY MOUTH EVERY DAY   levocetirizine (XYZAL) 5 MG tablet Take 1 tablet (5 mg total) by mouth every evening.   losartan (COZAAR) 100 MG tablet TAKE 1 TABLET BY MOUTH EVERY DAY   montelukast (SINGULAIR) 10 MG tablet Take 10 mg by mouth at bedtime.   Multiple Vitamins-Minerals (MACULAR VITAMIN BENEFIT PO) Take 1 capsule by mouth 2 (two) times daily.   NIFEdipine (PROCARDIA XL/NIFEDICAL XL) 60 MG 24 hr tablet TAKE 1 TABLET BY MOUTH EVERY DAY   NON  FORMULARY Allergy shots every 2 weeks   oxybutynin (DITROPAN) 5 MG tablet TAKE 1 TABLET BY MOUTH TWICE A DAY   pantoprazole (PROTONIX) 40 MG tablet TAKE 1 TABLET BY MOUTH TWICE A DAY FOR STOMACH   potassium chloride SA (KLOR-CON M20) 20 MEQ tablet TAKE TWO TABLETS BY MOUTH ONCE DAILY FOR POTASSIUM SUPPLEMENT   pravastatin (PRAVACHOL) 40 MG tablet TAKE 1 TABLET BY MOUTH EVERY DAY   SYSTANE ULTRA 0.4-0.3 % SOLN SMARTSIG:1 Drop(s) In Eye(s) As Needed   vitamin B-12 (CYANOCOBALAMIN) 1000 MCG tablet Take 1,000 mcg by mouth daily.   [DISCONTINUED] losartan (COZAAR) 100 MG tablet  TAKE 1 TABLET BY MOUTH EVERY DAY   No facility-administered encounter medications on file as of 10/23/2023.    Allergies (verified) Atenolol, Lisinopril, Meperidine hcl, Penicillin g sodium, Demerol, Lisinopril-hydrochlorothiazide, and Penicillins   History: Past Medical History:  Diagnosis Date   Abnormal Pap smear    Over 38yrs ago   ALKALINE PHOSPHATASE, ELEVATED 07/01/2009   Qualifier: Diagnosis of  By: Kriste Basque MD, Lonzo Cloud    Allergic rhinitis    Anemia    Arthritis    Asthma    Cataract    bilateral implants   Cough    occasional; 9-21: reports as ongoing cough , managing with cough medicine and scheduled and prn inhalers, reports mucus is clear and scant     Cystocele 11/05/08   Diverticulosis of colon    DJD (degenerative joint disease)    no per pt   Elevated alkaline phosphatase level    GERD (gastroesophageal reflux disease)    H/O urinary incontinence 11/06/2008   H/O varicella    Hard measles    Hx of colonic polyps    Hypercholesterolemia    Hypertension    Leg swelling    occasional   LPRD (laryngopharyngeal reflux disease) 05/29/2015   Overweight(278.02)    Rectocele 11/06/2008   Large   Sebaceous cyst    on back   Sinusitis    Venous insufficiency    Wheezing    occasional   Yeast infection    Past Surgical History:  Procedure Laterality Date   ABDOMINAL HYSTERECTOMY  1988   ANTERIOR AND POSTERIOR REPAIR  11/05/08   ANTERIOR AND POSTERIOR REPAIR N/A 05/15/2019   Procedure: ANTERIOR (CYSTOCELE) AND POSTERIOR REPAIR (RECTOCELE);  Surgeon: Osborn Coho, MD;  Location: [facility];  Service: Gynecology;  Laterality: N/A;   APOGEE / PERIGEE REPAIR  10/2008   Dr. Lance Morin   BLADDER SUSPENSION N/A 05/15/2019   Procedure: TRANSVAGINAL TAPE (TVT) PROCEDURE;  Surgeon: Osborn Coho, MD;  Location: [facility];  Service: Gynecology;  Laterality: N/A;   bladder tack  2011   BREAST SURGERY  1978   cyst removed from both removed    cataracts   approx 10 years   Dr Ninetta Lights , ctaract with lens placement    COLONOSCOPY     CYSTOSCOPY N/A 05/15/2019   Procedure: CYSTOSCOPY;  Surgeon: Osborn Coho, MD;  Location: [facility];  Service: Gynecology;  Laterality: N/A;   ESOPHAGOGASTRODUODENOSCOPY     Family History  Problem Relation Age of Onset   Aneurysm Mother    Lung cancer Father    Hypertension Daughter    Hypertension Daughter    Colon cancer Neg Hx    Esophageal cancer Neg Hx    Stomach cancer Neg Hx    Rectal cancer Neg Hx    Colon polyps Neg Hx  Social History   Socioeconomic History   Marital status: Married    Spouse name: Lahoma Rocker   Number of children: 2   Years of education: Not on file   Highest education level: Not on file  Occupational History   Occupation: LPN   Tobacco Use   Smoking status: Never   Smokeless tobacco: Never  Vaping Use   Vaping status: Never Used  Substance and Sexual Activity   Alcohol use: Yes    Alcohol/week: 2.0 standard drinks of alcohol    Types: 2 Cans of beer per week    Comment: 2 beers on the weekend   Drug use: No   Sexual activity: Not Currently    Partners: Male    Birth control/protection: None  Other Topics Concern   Not on file  Social History Narrative   Married   Never smoked   Alcohol few beers on week-end   No Advance Directive    Social Drivers of Health   Financial Resource Strain: Low Risk  (04/30/2018)   Overall Financial Resource Strain (CARDIA)    Difficulty of Paying Living Expenses: Not hard at all  Food Insecurity: No Food Insecurity (04/30/2018)   Hunger Vital Sign    Worried About Running Out of Food in the Last Year: Never true    Ran Out of Food in the Last Year: Never true  Transportation Needs: No Transportation Needs (04/30/2018)   PRAPARE - Administrator, Civil Service (Medical): No    Lack of Transportation (Non-Medical): No  Physical Activity: Inactive (04/30/2018)   Exercise Vital Sign     Days of Exercise per Week: 0 days    Minutes of Exercise per Session: 0 min  Stress: No Stress Concern Present (04/30/2018)   [employer] of Occupational Health - Occupational Stress Questionnaire    Feeling of Stress : Only a little  Social Connections: Socially Integrated (04/30/2018)   Social Connection and Isolation Panel [NHANES]    Frequency of Communication with Friends and Family: More than three times a week    Frequency of Social Gatherings with Friends and Family: More than three times a week    Attends Religious Services: More than 4 times per year    Active Member of [employer] or Organizations: Yes    Attends Engineer, structural: More than 4 times per year    Marital Status: Married    Tobacco Counseling Counseling given: Not Answered   Clinical Intake:  Pre-visit preparation completed: Yes  Pain : No/denies pain     BMI - recorded: 40 Nutritional Risks: None Diabetes: No  How often do you need to have someone help you when you read instructions, pamphlets, or other written materials from your doctor or pharmacy?: 1 - Never         Activities of Daily Living    10/23/2023   11:30 AM  In your present state of health, do you have any difficulty performing the following activities:  Hearing? 0  Vision? 0  Difficulty concentrating or making decisions? 1  Walking or climbing stairs? 0  Dressing or bathing? 0  Doing errands, shopping? 0  Preparing Food and eating ? N  Using the Toilet? N  In the past six months, have you accidently leaked urine? Y  Do you have problems with loss of bowel control? Y  Managing your Medications? N  Managing your Finances? N  Housekeeping or managing your Housekeeping? N    Patient Care Team: Janyth Contes,  Janene Harvey, NP as PCP - General (Geriatric Medicine) Mateo Flow, MD as Consulting Physician (Ophthalmology)  Indicate any recent Medical Services you may have received from other than [facility] providers in the past  year (date may be approximate).     Assessment:   This is a routine wellness examination for Wendy Figueroa.  Hearing/Vision screen Hearing Screening - Comments:: No hearing issues  Vision Screening - Comments:: Last eye exam less than 12 months ago with Dr.Hecker    Goals Addressed   None    Depression Screen    10/23/2023   10:58 AM 07/28/2023    1:12 PM 09/26/2022   10:23 AM 08/27/2021   10:31 AM 08/19/2021   11:05 AM 08/19/2020   10:12 AM 05/12/2020    1:09 PM  PHQ 2/9 Scores  PHQ - 2 Score 0 0 0 0 0 0 0    Fall Risk    10/23/2023   10:58 AM 07/28/2023    1:12 PM 01/23/2023   10:10 AM 09/26/2022   10:23 AM 12/27/2021   10:22 AM  Fall Risk   Falls in the past year? 0 1 0 0 0  Number falls in past yr: 0 0 0 0 0  Injury with Fall? 0 0 0 0 0  Risk for fall due to : No Fall Risks No Fall Risks No Fall Risks No Fall Risks No Fall Risks  Follow up Falls evaluation completed Falls evaluation completed Falls evaluation completed Falls evaluation completed Falls evaluation completed    MEDICARE RISK AT HOME: Medicare Risk at Home Any stairs in or around the home?: Yes If so, are there any without handrails?: No Home free of loose throw rugs in walkways, pet beds, electrical cords, etc?: Yes Adequate lighting in your home to reduce risk of falls?: Yes Life alert?: No Use of a cane, walker or w/c?: No Grab bars in the bathroom?: Yes Elevated toilet seat or a handicapped toilet?: No  TIMED UP AND GO:  Was the test performed?  No    Cognitive Function:    09/26/2022   11:01 AM 04/30/2018    8:59 AM 01/18/2017    8:53 AM 09/09/2016   10:17 AM 05/29/2015   10:00 AM  MMSE - Mini Mental State Exam  Orientation to time 3 4 5 4 5   Orientation to Place 5 5 5 5 5   Registration 3 3 3 3 3   Attention/ Calculation 5 4 5 3 5   Recall 3 2 2 2 3   Language- name 2 objects 2 2 2 2 2   Language- repeat 1 1 1 1 1   Language- follow 3 step command 3 3 3 2 3   Language- read & follow direction 1 1 1 1 1    Write a sentence 1 1 1 1 1   Copy design 1 1 1 1 1   Total score 28 27 29 25 30         10/23/2023   10:59 AM 08/19/2021   11:08 AM 05/12/2020    1:12 PM 05/06/2019    8:32 AM  6CIT Screen  What Year? 0 points 0 points 0 points 0 points  What month? 0 points 0 points 0 points 0 points  What time? 0 points 0 points 0 points 0 points  Count back from 20 0 points 0 points 0 points 0 points  Months in reverse 4 points 2 points 4 points 0 points  Repeat phrase 8 points 0 points 0 points 0 points  Total Score 12  points 2 points 4 points 0 points    Immunizations Immunization History  Administered Date(s) Administered   Fluad Quad(high Dose 65+) 05/25/2020, 05/19/2021, 05/15/2022   Influenza Split 06/11/2011, 05/27/2013   Influenza Whole 07/05/2010, 04/23/2012   Influenza, High Dose Seasonal PF 04/30/2018, 06/06/2019, 05/23/2023   Influenza,inj,Quad PF,6+ Mos 05/29/2015, 05/06/2016, 05/17/2017   Influenza-Unspecified 05/22/2014, 10/26/2015   PFIZER(Purple Top)SARS-COV-2 Vaccination 09/27/2019, 10/18/2019, 06/02/2020   Pfizer Covid-19 Vaccine Bivalent Booster 51yrs & up 03/17/2021, 10/25/2021, 06/09/2022   Pfizer(Comirnaty)Fall Seasonal Vaccine 12 years and older 06/16/2023   Pneumococcal Conjugate-13 10/14/2014   Pneumococcal Polysaccharide-23 07/29/2010, 04/30/2021   Tdap 02/28/2017   Zoster Recombinant(Shingrix) 09/03/2021, 10/29/2021    TDAP status: Up to date  Flu Vaccine status: Up to date  Pneumococcal vaccine status: Up to date  Covid-19 vaccine status: Information provided on how to obtain vaccines.   Qualifies for Shingles Vaccine? Yes   Zostavax completed No   Shingrix Completed?: Yes  Screening Tests Health Maintenance  Topic Date Due   COVID-19 Vaccine (8 - 2024-25 season) 05/22/2024 (Originally 08/11/2023)   Medicare Annual Wellness (AWV)  10/22/2024   DEXA SCAN  07/12/2025   DTaP/Tdap/Td (2 - Td or Tdap) 03/01/2027   Pneumonia Vaccine 73+ Years old   Completed   INFLUENZA VACCINE  Completed   Zoster Vaccines- Shingrix  Completed   HPV VACCINES  Aged Out   Colonoscopy  Discontinued   Hepatitis C Screening  Discontinued    Health Maintenance  There are no preventive care reminders to display for this patient.   Colorectal cancer screening: No longer required.   Mammogram status: No longer required due to age.  Bone Density status: Completed 2024. Results reflect: Bone density results: OSTEOPENIA. Repeat every 2 years.  Lung Cancer Screening: (Low Dose CT Chest recommended if Age 47-80 years, 20 pack-year currently smoking OR have quit w/in 15years.) does not qualify.   Lung Cancer Screening Referral: na  Additional Screening:  Hepatitis C Screening: does qualify; Completed   Vision Screening: Recommended annual ophthalmology exams for early detection of glaucoma and other disorders of the eye. Is the patient up to date with their annual eye exam?  Yes  Who is the provider or what is the name of the office in which the patient attends annual eye exams? [facility] If pt is not established with a provider, would they like to be referred to a provider to establish care? No .   Dental Screening: Recommended annual dental exams for proper oral hygiene  Community Resource Referral / Chronic Care Management: CRR required this visit?  No   CCM required this visit?  No     Plan:     I have personally reviewed and noted the following in the patient's chart:   Medical and social history Use of alcohol, tobacco or illicit drugs  Current medications and supplements including opioid prescriptions. Patient is not currently taking opioid prescriptions. Functional ability and status Nutritional status Physical activity Advanced directives List of other physicians Hospitalizations, surgeries, and ER visits in previous 12 months Vitals Screenings to include cognitive, depression, and falls Referrals and appointments  In addition,  I have reviewed and discussed with patient certain preventive protocols, quality metrics, and best practice recommendations. A written personalized care plan for preventive services as well as general preventive health recommendations were provided to patient.     Sharon Seller, NP   10/23/2023

## 2023-10-27 DIAGNOSIS — J3081 Allergic rhinitis due to animal (cat) (dog) hair and dander: Secondary | ICD-10-CM | POA: Diagnosis not present

## 2023-10-27 DIAGNOSIS — J301 Allergic rhinitis due to pollen: Secondary | ICD-10-CM | POA: Diagnosis not present

## 2023-10-27 DIAGNOSIS — J3089 Other allergic rhinitis: Secondary | ICD-10-CM | POA: Diagnosis not present

## 2023-11-01 DIAGNOSIS — H11423 Conjunctival edema, bilateral: Secondary | ICD-10-CM | POA: Diagnosis not present

## 2023-11-01 DIAGNOSIS — H04123 Dry eye syndrome of bilateral lacrimal glands: Secondary | ICD-10-CM | POA: Diagnosis not present

## 2023-11-01 DIAGNOSIS — H40023 Open angle with borderline findings, high risk, bilateral: Secondary | ICD-10-CM | POA: Diagnosis not present

## 2023-11-01 DIAGNOSIS — H353132 Nonexudative age-related macular degeneration, bilateral, intermediate dry stage: Secondary | ICD-10-CM | POA: Diagnosis not present

## 2023-11-02 ENCOUNTER — Encounter: Payer: Self-pay | Admitting: Nurse Practitioner

## 2023-11-03 DIAGNOSIS — J301 Allergic rhinitis due to pollen: Secondary | ICD-10-CM | POA: Diagnosis not present

## 2023-11-03 DIAGNOSIS — J3081 Allergic rhinitis due to animal (cat) (dog) hair and dander: Secondary | ICD-10-CM | POA: Diagnosis not present

## 2023-11-03 DIAGNOSIS — J3089 Other allergic rhinitis: Secondary | ICD-10-CM | POA: Diagnosis not present

## 2023-11-10 DIAGNOSIS — J3081 Allergic rhinitis due to animal (cat) (dog) hair and dander: Secondary | ICD-10-CM | POA: Diagnosis not present

## 2023-11-10 DIAGNOSIS — J301 Allergic rhinitis due to pollen: Secondary | ICD-10-CM | POA: Diagnosis not present

## 2023-11-10 DIAGNOSIS — J3089 Other allergic rhinitis: Secondary | ICD-10-CM | POA: Diagnosis not present

## 2023-11-14 ENCOUNTER — Other Ambulatory Visit: Payer: Self-pay | Admitting: Nurse Practitioner

## 2023-11-17 DIAGNOSIS — J3089 Other allergic rhinitis: Secondary | ICD-10-CM | POA: Diagnosis not present

## 2023-11-17 DIAGNOSIS — J3081 Allergic rhinitis due to animal (cat) (dog) hair and dander: Secondary | ICD-10-CM | POA: Diagnosis not present

## 2023-11-17 DIAGNOSIS — J301 Allergic rhinitis due to pollen: Secondary | ICD-10-CM | POA: Diagnosis not present

## 2023-11-24 DIAGNOSIS — J301 Allergic rhinitis due to pollen: Secondary | ICD-10-CM | POA: Diagnosis not present

## 2023-11-24 DIAGNOSIS — J3081 Allergic rhinitis due to animal (cat) (dog) hair and dander: Secondary | ICD-10-CM | POA: Diagnosis not present

## 2023-11-24 DIAGNOSIS — J3089 Other allergic rhinitis: Secondary | ICD-10-CM | POA: Diagnosis not present

## 2023-12-01 DIAGNOSIS — J301 Allergic rhinitis due to pollen: Secondary | ICD-10-CM | POA: Diagnosis not present

## 2023-12-01 DIAGNOSIS — J3081 Allergic rhinitis due to animal (cat) (dog) hair and dander: Secondary | ICD-10-CM | POA: Diagnosis not present

## 2023-12-01 DIAGNOSIS — J3089 Other allergic rhinitis: Secondary | ICD-10-CM | POA: Diagnosis not present

## 2023-12-15 DIAGNOSIS — J301 Allergic rhinitis due to pollen: Secondary | ICD-10-CM | POA: Diagnosis not present

## 2023-12-15 DIAGNOSIS — J3089 Other allergic rhinitis: Secondary | ICD-10-CM | POA: Diagnosis not present

## 2023-12-15 DIAGNOSIS — J3081 Allergic rhinitis due to animal (cat) (dog) hair and dander: Secondary | ICD-10-CM | POA: Diagnosis not present

## 2023-12-22 DIAGNOSIS — J3089 Other allergic rhinitis: Secondary | ICD-10-CM | POA: Diagnosis not present

## 2023-12-22 DIAGNOSIS — J3081 Allergic rhinitis due to animal (cat) (dog) hair and dander: Secondary | ICD-10-CM | POA: Diagnosis not present

## 2023-12-22 DIAGNOSIS — J301 Allergic rhinitis due to pollen: Secondary | ICD-10-CM | POA: Diagnosis not present

## 2023-12-26 ENCOUNTER — Encounter: Payer: Self-pay | Admitting: Podiatry

## 2023-12-26 ENCOUNTER — Ambulatory Visit: Payer: Medicare Other | Admitting: Podiatry

## 2023-12-26 DIAGNOSIS — B351 Tinea unguium: Secondary | ICD-10-CM

## 2023-12-26 DIAGNOSIS — M79674 Pain in right toe(s): Secondary | ICD-10-CM

## 2023-12-26 DIAGNOSIS — I872 Venous insufficiency (chronic) (peripheral): Secondary | ICD-10-CM

## 2023-12-26 DIAGNOSIS — M79675 Pain in left toe(s): Secondary | ICD-10-CM

## 2023-12-26 NOTE — Progress Notes (Signed)
This patient returns to my office for at risk foot care.  This patient requires this care by a professional since this patient will be at risk due to having venous insufficiency  This patient is unable to cut nails herself since the patient cannot reach her nails.These nails are painful walking and wearing shoes.  This patient presents for at risk foot care today.  General Appearance  Alert, conversant and in no acute stress.  Vascular  Dorsalis pedis and posterior tibial  pulses are palpable  bilaterally.  Capillary return is within normal limits  bilaterally. Temperature is within normal limits  bilaterally.  Neurologic  Senn-Weinstein monofilament wire test within normal limits  bilaterally. Muscle power within normal limits bilaterally.  Nails Thick disfigured discolored nails with subungual debris  from hallux to fifth toes bilaterally. No evidence of bacterial infection or drainage bilaterally.  Orthopedic  No limitations of motion  feet .  No crepitus or effusions noted.  HAV with hammer toes  B/L.  Skin  normotropic skin with no porokeratosis noted bilaterally.  No signs of infections or ulcers noted.     Onychomycosis  Pain in right toes  Pain in left toes  Consent was obtained for treatment procedures.   Mechanical debridement of nails 1-5  bilaterally performed with a nail nipper.  Filed with dremel without incident. No infection or ulcer.     Return office visit    12 weeks                Told patient to return for periodic foot care and evaluation due to potential at risk complications.   Hiro Vipond DPM  

## 2023-12-27 ENCOUNTER — Other Ambulatory Visit: Payer: Self-pay | Admitting: Nurse Practitioner

## 2023-12-27 DIAGNOSIS — F4322 Adjustment disorder with anxiety: Secondary | ICD-10-CM

## 2023-12-27 NOTE — Telephone Encounter (Signed)
 High Risk Warning Populated when attempting to refill, I will send to Provider for further review

## 2023-12-29 DIAGNOSIS — J3089 Other allergic rhinitis: Secondary | ICD-10-CM | POA: Diagnosis not present

## 2023-12-29 DIAGNOSIS — J3081 Allergic rhinitis due to animal (cat) (dog) hair and dander: Secondary | ICD-10-CM | POA: Diagnosis not present

## 2023-12-29 DIAGNOSIS — J301 Allergic rhinitis due to pollen: Secondary | ICD-10-CM | POA: Diagnosis not present

## 2024-01-02 ENCOUNTER — Other Ambulatory Visit: Payer: Self-pay

## 2024-01-02 DIAGNOSIS — R053 Chronic cough: Secondary | ICD-10-CM

## 2024-01-02 MED ORDER — HYDROCOD POLI-CHLORPHE POLI ER 10-8 MG/5ML PO SUER
5.0000 mL | Freq: Two times a day (BID) | ORAL | 0 refills | Status: DC | PRN
Start: 2024-01-02 — End: 2024-05-14

## 2024-01-02 NOTE — Telephone Encounter (Signed)
 Copied from CRM [id number]. Topic: Clinical - Prescription Issue >> Jan 02, 2024 12:53 PM Brynn Caras wrote: Reason for CRM: The patient wanted to determine the status of her refill request for chlorpheniramine -HYDROcodone  (TUSSIONEX) 10-8 MG/5ML, the patient states her request was placed on 05/07. Advised this request to refill her prescription has been sent to her provider for further review as of now. The patient understood.  Call the patient this afternoon and left a voicemail in regards to the medication that patient is asking for you. I looked into the charts that the oxybutynin  and DULoxetine  (CYMBALTA ) were the only medication that were sent to the pharmacy on May 7th. Medication will be sent to the pharmacy by approval by Verma Gobble, NP   Message sent to Verma Gobble, NP

## 2024-01-05 DIAGNOSIS — J3089 Other allergic rhinitis: Secondary | ICD-10-CM | POA: Diagnosis not present

## 2024-01-05 DIAGNOSIS — J301 Allergic rhinitis due to pollen: Secondary | ICD-10-CM | POA: Diagnosis not present

## 2024-01-05 DIAGNOSIS — J3081 Allergic rhinitis due to animal (cat) (dog) hair and dander: Secondary | ICD-10-CM | POA: Diagnosis not present

## 2024-01-12 DIAGNOSIS — J301 Allergic rhinitis due to pollen: Secondary | ICD-10-CM | POA: Diagnosis not present

## 2024-01-12 DIAGNOSIS — J3081 Allergic rhinitis due to animal (cat) (dog) hair and dander: Secondary | ICD-10-CM | POA: Diagnosis not present

## 2024-01-12 DIAGNOSIS — J3089 Other allergic rhinitis: Secondary | ICD-10-CM | POA: Diagnosis not present

## 2024-01-26 ENCOUNTER — Ambulatory Visit: Payer: Medicare Other | Admitting: Nurse Practitioner

## 2024-01-26 DIAGNOSIS — J301 Allergic rhinitis due to pollen: Secondary | ICD-10-CM | POA: Diagnosis not present

## 2024-01-26 DIAGNOSIS — J3089 Other allergic rhinitis: Secondary | ICD-10-CM | POA: Diagnosis not present

## 2024-01-26 DIAGNOSIS — J3081 Allergic rhinitis due to animal (cat) (dog) hair and dander: Secondary | ICD-10-CM | POA: Diagnosis not present

## 2024-02-02 ENCOUNTER — Ambulatory Visit (INDEPENDENT_AMBULATORY_CARE_PROVIDER_SITE_OTHER): Admitting: Nurse Practitioner

## 2024-02-02 ENCOUNTER — Encounter: Payer: Self-pay | Admitting: Nurse Practitioner

## 2024-02-02 VITALS — BP 126/76 | HR 95 | Temp 97.7°F | Resp 18 | Ht 62.08 in | Wt 223.8 lb

## 2024-02-02 DIAGNOSIS — J301 Allergic rhinitis due to pollen: Secondary | ICD-10-CM | POA: Diagnosis not present

## 2024-02-02 DIAGNOSIS — J4520 Mild intermittent asthma, uncomplicated: Secondary | ICD-10-CM | POA: Diagnosis not present

## 2024-02-02 DIAGNOSIS — I10 Essential (primary) hypertension: Secondary | ICD-10-CM | POA: Diagnosis not present

## 2024-02-02 DIAGNOSIS — K219 Gastro-esophageal reflux disease without esophagitis: Secondary | ICD-10-CM

## 2024-02-02 DIAGNOSIS — J3081 Allergic rhinitis due to animal (cat) (dog) hair and dander: Secondary | ICD-10-CM | POA: Diagnosis not present

## 2024-02-02 DIAGNOSIS — R053 Chronic cough: Secondary | ICD-10-CM | POA: Diagnosis not present

## 2024-02-02 DIAGNOSIS — K5904 Chronic idiopathic constipation: Secondary | ICD-10-CM | POA: Diagnosis not present

## 2024-02-02 DIAGNOSIS — M8580 Other specified disorders of bone density and structure, unspecified site: Secondary | ICD-10-CM | POA: Diagnosis not present

## 2024-02-02 DIAGNOSIS — R600 Localized edema: Secondary | ICD-10-CM | POA: Insufficient documentation

## 2024-02-02 DIAGNOSIS — D692 Other nonthrombocytopenic purpura: Secondary | ICD-10-CM | POA: Insufficient documentation

## 2024-02-02 DIAGNOSIS — E785 Hyperlipidemia, unspecified: Secondary | ICD-10-CM

## 2024-02-02 DIAGNOSIS — J3089 Other allergic rhinitis: Secondary | ICD-10-CM

## 2024-02-02 DIAGNOSIS — N3281 Overactive bladder: Secondary | ICD-10-CM | POA: Diagnosis not present

## 2024-02-02 DIAGNOSIS — F4322 Adjustment disorder with anxiety: Secondary | ICD-10-CM

## 2024-02-02 NOTE — Assessment & Plan Note (Signed)
Recommended to take calcium 600 mg twice daily with Vitamin D 2000 units daily and weight bearing activity 30 mins/5 days a week   

## 2024-02-02 NOTE — Progress Notes (Signed)
 Careteam: Patient Care Team: Verma Gobble, NP as PCP - General (Geriatric Medicine) Amedeo Jupiter, MD as Consulting Physician (Ophthalmology)  PLACE OF SERVICE:  [facility]  Advanced Directive information    Allergies  Allergen Reactions   Atenolol Other (See Comments)   Lisinopril Other (See Comments)   Meperidine Hcl Other (See Comments)   Penicillin G Sodium Other (See Comments)   Demerol Hives    All over the body    Lisinopril-Hydrochlorothiazide Hives    All over the body   Penicillins Hives    Has patient had a PCN reaction causing immediate rash, facial/tongue/throat swelling, SOB or lightheadedness with hypotension: No Has patient had a PCN reaction causing severe rash involving mucus membranes or skin necrosis: No Has patient had a PCN reaction that required hospitalization: No Has patient had a PCN reaction occurring within the last 10 years: No If all of the above answers are NO, then may proceed with Cephalosporin use.   All over the body    Chief Complaint  Patient presents with   Medical Management of Chronic Issues    Six Month Follow-up    HPI:  Discussed the use of AI scribe software for clinical note transcription with the patient, who gave verbal consent to proceed.  History of Present Illness Wendy Figueroa is an 78 year old female who presents for a six-month follow-up visit.  She has no new symptoms since her last visit. Her weight remains stable, and she is attempting to increase her physical activity by walking three to four days a week, though she feels very tired after such activities.  She continues to take calcium and vitamin D  for osteopenia.   She uses tussinex sparingly for her cough, particularly before church, and reports no changes in her cough.   She is not currently using Trelegy due to cost but is using Symbicort  for asthma management. No worsening of shortness of breath is noted.  She takes Lasix  daily for  swelling and reports no changes in her symptoms. She also takes potassium.  She uses Xyzal  for allergies and notes that humid weather exacerbates her symptoms, causing her to cough when not wearing a mask outside.  She takes oxybutynin  for overactive bladder and experiences some dry mouth, which she manages with tablets.   She takes Protonix  daily for indigestion and acid reflux  pravastatin  for cholesterol management. She takes vitamin B12 in the morning to avoid sleep disturbances.  She reports bruising on her arms, which she notices upon waking. No chest pain, funny heartbeats, dizziness, lightheadedness, or falls. No increase in anxiety or depression. She takes Tylenol  for arthritis pain and avoids NSAIDs like Aleve.    Review of Systems:  Review of Systems  Constitutional:  Negative for chills, fever and weight loss.  HENT:  Negative for tinnitus.   Respiratory:  Positive for cough. Negative for sputum production and shortness of breath.   Cardiovascular:  Positive for leg swelling. Negative for chest pain and palpitations.  Gastrointestinal:  Negative for abdominal pain, constipation, diarrhea and heartburn.  Genitourinary:  Negative for dysuria, frequency and urgency.  Musculoskeletal:  Negative for back pain, falls, joint pain and myalgias.  Skin: Negative.   Neurological:  Negative for dizziness and headaches.  Psychiatric/Behavioral:  Negative for depression and memory loss. The patient does not have insomnia.     Past Medical History:  Diagnosis Date   Abnormal Pap smear    Over 33yrs ago   ALKALINE  PHOSPHATASE, ELEVATED 07/01/2009   Qualifier: Diagnosis of  By: Elinor Guardian MD, Raenelle Bumpers    Allergic rhinitis    Anemia    Arthritis    Asthma    Cataract    bilateral implants   Cough    occasional; 9-21: reports as ongoing cough , managing with cough medicine and scheduled and prn inhalers, reports mucus is clear and scant     Cystocele 11/05/08   Diverticulosis of colon     DJD (degenerative joint disease)    no per pt   Elevated alkaline phosphatase level    GERD (gastroesophageal reflux disease)    H/O urinary incontinence 11/06/2008   H/O varicella    Hard measles    Hx of colonic polyps    Hypercholesterolemia    Hypertension    Leg swelling    occasional   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 Adine Ahmadi   BLADDER SUSPENSION N/A 05/15/2019   Procedure: TRANSVAGINAL TAPE (TVT) PROCEDURE;  Surgeon: Renea Carrion, MD;  Location: [facility];  Service: Gynecology;  Laterality: N/A;   bladder tack  2011   BREAST SURGERY  1978   cyst removed from both removed   cataracts   approx 10 years   Dr Alwin Baars , ctaract with lens placement    COLONOSCOPY     CYSTOSCOPY N/A 05/15/2019   Procedure: CYSTOSCOPY;  Surgeon: Renea Carrion, MD;  Location: [facility];  Service: Gynecology;  Laterality: N/A;   ESOPHAGOGASTRODUODENOSCOPY     Social History:   reports that she has never smoked. She has never used smokeless tobacco. She reports current alcohol use of about 2.0 standard drinks of alcohol per week. She reports that she does not use drugs.  Family History  Problem Relation Age of Onset   Aneurysm Mother    Lung cancer Father    Hypertension Daughter    Hypertension Daughter    Colon cancer Neg Hx    Esophageal cancer Neg Hx    Stomach cancer Neg Hx     Rectal cancer Neg Hx    Colon polyps Neg Hx     Medications: Patient's Medications  New Prescriptions   No medications on file  Previous Medications   ALBUTEROL  (PROVENTIL  HFA;VENTOLIN  HFA) 108 (90 BASE) MCG/ACT INHALER    Inhale 2 puffs into the lungs every 6 (six) hours as needed for wheezing or shortness of breath.   CALCIUM CARB-CHOLECALCIFEROL (CALCIUM 600 + D PO)    Take 1 tablet by mouth 2 (two) times daily.    CETIRIZINE (ZYRTEC) 10 MG TABLET    Take 10 mg by mouth daily.   CHLORPHENIRAMINE -HYDROCODONE  (TUSSIONEX) 10-8 MG/5ML    Take 5 mLs by mouth every 12 (twelve) hours as needed for cough.   CHOLECALCIFEROL (D3-1000) 25 MCG (1000 UT) CAPSULE    Take 1,000 Units by mouth  daily.   DOCUSATE SODIUM (COLACE) 100 MG CAPSULE    Take 100 mg by mouth daily.   DULOXETINE  (CYMBALTA ) 60 MG CAPSULE    TAKE 1 CAPSULE (60 MG TOTAL) BY MOUTH DAILY. DX (F43.22)   EPINEPHRINE 0.3 MG/0.3 ML IJ SOAJ INJECTION    epinephrine 0.3 mg/0.3 mL injection, auto-injector   FLUTICASONE -UMECLIDIN-VILANT (TRELEGY ELLIPTA) 200-62.5-25 MCG/ACT AEPB    Inhale 1 puff into the lungs daily.   FUROSEMIDE  (LASIX ) 40 MG TABLET    TAKE 1 TABLET BY MOUTH EVERY DAY   LEVOCETIRIZINE (XYZAL ) 5 MG TABLET    Take 1 tablet (5 mg total) by mouth every evening.   LOSARTAN  (COZAAR ) 100 MG TABLET    TAKE 1 TABLET BY MOUTH EVERY DAY   MONTELUKAST  (SINGULAIR ) 10 MG TABLET    Take 10 mg by mouth at bedtime.   MULTIPLE VITAMINS-MINERALS (MACULAR VITAMIN BENEFIT PO)    Take 1 capsule by mouth 2 (two) times daily.   NIFEDIPINE  (PROCARDIA  XL/NIFEDICAL XL) 60 MG 24 HR TABLET    TAKE 1 TABLET BY MOUTH EVERY DAY   NON FORMULARY    Allergy shots every 2 weeks   OXYBUTYNIN  (DITROPAN ) 5 MG TABLET    TAKE 1 TABLET BY MOUTH TWICE A DAY   PANTOPRAZOLE  (PROTONIX ) 40 MG TABLET    TAKE 1 TABLET BY MOUTH TWICE A DAY FOR STOMACH   POTASSIUM CHLORIDE  SA (KLOR-CON  M20) 20 MEQ TABLET    TAKE TWO TABLETS BY MOUTH ONCE DAILY FOR POTASSIUM SUPPLEMENT    PRAVASTATIN  (PRAVACHOL ) 40 MG TABLET    TAKE 1 TABLET BY MOUTH EVERY DAY   SYSTANE ULTRA 0.4-0.3 % SOLN    SMARTSIG:1 Drop(s) In Eye(s) As Needed   VITAMIN B-12 (CYANOCOBALAMIN) 1000 MCG TABLET    Take 1,000 mcg by mouth daily.  Modified Medications   No medications on file  Discontinued Medications   No medications on file    Physical Exam:  Vitals:   02/02/24 1315  BP: 126/76  Pulse: 95  Resp: 18  Temp: 97.7 F (36.5 C)  SpO2: 96%  Weight: 223 lb 12.8 oz (101.5 kg)  Height: 5' 2.08 (1.577 m)   Body mass index is 40.83 kg/m. Wt Readings from Last 3 Encounters:  02/02/24 223 lb 12.8 oz (101.5 kg)  10/23/23 220 lb (99.8 kg)  07/28/23 222 lb 3.2 oz (100.8 kg)    Physical Exam Constitutional:      General: She is not in acute distress.    Appearance: She is well-developed. She is not diaphoretic.  HENT:     Head: Normocephalic and atraumatic.     Mouth/Throat:     Pharynx: No oropharyngeal exudate.   Eyes:     Conjunctiva/sclera: Conjunctivae normal.     Pupils: Pupils are equal, round, and reactive to light.    Cardiovascular:     Rate and Rhythm: Normal rate and regular rhythm.     Heart sounds: Normal heart sounds.  Pulmonary:     Effort: Pulmonary effort is normal.     Breath sounds: Normal breath sounds.  Abdominal:     General: Bowel sounds are normal.     Palpations: Abdomen is soft.   Musculoskeletal:     Cervical back: Normal range of motion and neck supple.     Right lower leg: No edema.     Left lower leg: No edema.   Skin:    General: Skin is warm and dry.   Neurological:  Mental Status: She is alert.   Psychiatric:        Mood and Affect: Mood normal.     Labs reviewed: Basic Metabolic Panel: Recent Labs    07/28/23 1343  NA 139  K 4.4  CL 106  CO2 27  GLUCOSE 86  BUN 12  CREATININE 0.77  CALCIUM 9.4   Liver Function Tests: Recent Labs    07/28/23 1343  AST 22  ALT 18  BILITOT 0.5  PROT 7.2   No results for  input(s): LIPASE, AMYLASE in the last 8760 hours. No results for input(s): AMMONIA in the last 8760 hours. CBC: Recent Labs    07/28/23 1343  WBC 6.8  NEUTROABS 3,162  HGB 11.5*  HCT 35.2  MCV 84.0  PLT 276   Lipid Panel: Recent Labs    07/28/23 1343  CHOL 173  HDL 56  LDLCALC 98  TRIG 101  CHOLHDL 3.1   TSH: No results for input(s): TSH in the last 8760 hours. A1C: Lab Results  Component Value Date   HGBA1C 5.1 10/02/2023     Assessment/Plan  Essential hypertension Assessment & Plan: Blood pressure well controlled, goal bp <140/90 Continue current medications and dietary modifications follow metabolic panel   Orders: -     COMPLETE METABOLIC PANEL WITHOUT GFR -     CBC with Differential/Platelet  Chronic cough Assessment & Plan: Ongoing but stable, continues lifestyle modifications with tussinex PRN   Morbid obesity (HCC) Assessment & Plan: -education provided on healthy weight loss through increase in physical activity and proper nutrition    Mild intermittent asthma, unspecified whether complicated Assessment & Plan: Unable to afford trelegy but tolerating symbicort  which she had at home. She plans to talk to pulmonologist at follow up. Continues on symbicort  at this time   Gastroesophageal reflux disease without esophagitis Assessment & Plan: Stable on protonix  by mouth twice daily    Senile purpura (HCC) Assessment & Plan: Will continue to monitor Will get cbc today    Overactive bladder Assessment & Plan: Continues on oxybuynin- discussed side effects associated with oxybuynin.    Osteopenia, unspecified location Assessment & Plan: Recommended to take calcium 600 mg twice daily with Vitamin D  2000 units daily and weight bearing activity 30 mins/5 days a week   Hyperlipidemia LDL goal <130 Assessment & Plan: Continues on pravstatin  Follow lipids yearly    Chronic idiopathic constipation Assessment &  Plan: Controlled on colace 100 mg by mouth daily    Adjustment disorder with anxious mood Assessment & Plan: Well controlled on cymbalta . Continue current regimen   Bilateral leg edema Assessment & Plan: Stable on lasix  with potassium -encouraged to elevate legs above level of heart as tolerates, low sodium diet, compression hose as tolerates (on in am, off in pm)   Non-seasonal allergic rhinitis, unspecified trigger Assessment & Plan: Ongoing but stable on xzyal. Continue lifestyle modifications.       Return in about 6 months (around 08/03/2024) for routine follow up.:  Wendy Figueroa [facility] [phone]

## 2024-02-02 NOTE — Assessment & Plan Note (Signed)
 Stable on protonix  by mouth twice daily

## 2024-02-02 NOTE — Assessment & Plan Note (Signed)
 Will continue to monitor Will get cbc today

## 2024-02-02 NOTE — Assessment & Plan Note (Signed)
 Stable on lasix  with potassium -encouraged to elevate legs above level of heart as tolerates, low sodium diet, compression hose as tolerates (on in am, off in pm)

## 2024-02-02 NOTE — Assessment & Plan Note (Signed)
 Controlled on colace 100 mg by mouth daily

## 2024-02-02 NOTE — Assessment & Plan Note (Signed)
 Blood pressure well controlled, goal bp <140/90 Continue current medications and dietary modifications follow metabolic panel

## 2024-02-02 NOTE — Assessment & Plan Note (Signed)
 Unable to afford trelegy but tolerating symbicort  which she had at home. She plans to talk to pulmonologist at follow up. Continues on symbicort  at this time

## 2024-02-02 NOTE — Assessment & Plan Note (Signed)
 Well controlled on cymbalta . Continue current regimen

## 2024-02-02 NOTE — Assessment & Plan Note (Signed)
 Continues on oxybuynin- discussed side effects associated with oxybuynin.

## 2024-02-02 NOTE — Assessment & Plan Note (Signed)
-  education provided on healthy weight loss through increase in physical activity and proper nutrition

## 2024-02-02 NOTE — Assessment & Plan Note (Signed)
 Continues on pravstatin  Follow lipids yearly

## 2024-02-02 NOTE — Assessment & Plan Note (Signed)
 Ongoing but stable, continues lifestyle modifications with tussinex PRN

## 2024-02-02 NOTE — Assessment & Plan Note (Signed)
 Ongoing but stable on xzyal. Continue lifestyle modifications.

## 2024-02-03 LAB — CBC WITH DIFFERENTIAL/PLATELET
Absolute Lymphocytes: 2647 {cells}/uL (ref 850–3900)
Absolute Monocytes: 576 {cells}/uL (ref 200–950)
Basophils Absolute: 40 {cells}/uL (ref 0–200)
Basophils Relative: 0.6 %
Eosinophils Absolute: 194 {cells}/uL (ref 15–500)
Eosinophils Relative: 2.9 %
HCT: 35.5 % (ref 35.0–45.0)
Hemoglobin: 11.3 g/dL — ABNORMAL LOW (ref 11.7–15.5)
MCH: 27 pg (ref 27.0–33.0)
MCHC: 31.8 g/dL — ABNORMAL LOW (ref 32.0–36.0)
MCV: 84.7 fL (ref 80.0–100.0)
MPV: 11 fL (ref 7.5–12.5)
Monocytes Relative: 8.6 %
Neutro Abs: 3243 {cells}/uL (ref 1500–7800)
Neutrophils Relative %: 48.4 %
Platelets: 273 10*3/uL (ref 140–400)
RBC: 4.19 10*6/uL (ref 3.80–5.10)
RDW: 13.6 % (ref 11.0–15.0)
Total Lymphocyte: 39.5 %
WBC: 6.7 10*3/uL (ref 3.8–10.8)

## 2024-02-03 LAB — COMPLETE METABOLIC PANEL WITHOUT GFR
AG Ratio: 1.1 (calc) (ref 1.0–2.5)
ALT: 14 U/L (ref 6–29)
AST: 20 U/L (ref 10–35)
Albumin: 4 g/dL (ref 3.6–5.1)
Alkaline phosphatase (APISO): 170 U/L — ABNORMAL HIGH (ref 37–153)
BUN: 12 mg/dL (ref 7–25)
CO2: 27 mmol/L (ref 20–32)
Calcium: 9.4 mg/dL (ref 8.6–10.4)
Chloride: 107 mmol/L (ref 98–110)
Creat: 0.92 mg/dL (ref 0.60–0.95)
Globulin: 3.5 g/dL (ref 1.9–3.7)
Glucose, Bld: 90 mg/dL (ref 65–139)
Potassium: 4.2 mmol/L (ref 3.5–5.3)
Sodium: 140 mmol/L (ref 135–146)
Total Bilirubin: 0.5 mg/dL (ref 0.2–1.2)
Total Protein: 7.5 g/dL (ref 6.1–8.1)

## 2024-02-05 ENCOUNTER — Ambulatory Visit: Payer: Self-pay | Admitting: Nurse Practitioner

## 2024-02-14 ENCOUNTER — Other Ambulatory Visit: Payer: Self-pay | Admitting: Nurse Practitioner

## 2024-02-16 DIAGNOSIS — J3081 Allergic rhinitis due to animal (cat) (dog) hair and dander: Secondary | ICD-10-CM | POA: Diagnosis not present

## 2024-02-16 DIAGNOSIS — J301 Allergic rhinitis due to pollen: Secondary | ICD-10-CM | POA: Diagnosis not present

## 2024-02-16 DIAGNOSIS — J3089 Other allergic rhinitis: Secondary | ICD-10-CM | POA: Diagnosis not present

## 2024-02-18 ENCOUNTER — Other Ambulatory Visit: Payer: Self-pay | Admitting: Nurse Practitioner

## 2024-02-19 ENCOUNTER — Other Ambulatory Visit: Payer: Self-pay | Admitting: Nurse Practitioner

## 2024-02-19 NOTE — Telephone Encounter (Signed)
 High risk warning

## 2024-02-22 DIAGNOSIS — J3089 Other allergic rhinitis: Secondary | ICD-10-CM | POA: Diagnosis not present

## 2024-02-22 DIAGNOSIS — J3081 Allergic rhinitis due to animal (cat) (dog) hair and dander: Secondary | ICD-10-CM | POA: Diagnosis not present

## 2024-02-22 DIAGNOSIS — J301 Allergic rhinitis due to pollen: Secondary | ICD-10-CM | POA: Diagnosis not present

## 2024-03-08 DIAGNOSIS — J301 Allergic rhinitis due to pollen: Secondary | ICD-10-CM | POA: Diagnosis not present

## 2024-03-08 DIAGNOSIS — J3081 Allergic rhinitis due to animal (cat) (dog) hair and dander: Secondary | ICD-10-CM | POA: Diagnosis not present

## 2024-03-08 DIAGNOSIS — J3089 Other allergic rhinitis: Secondary | ICD-10-CM | POA: Diagnosis not present

## 2024-03-22 DIAGNOSIS — J301 Allergic rhinitis due to pollen: Secondary | ICD-10-CM | POA: Diagnosis not present

## 2024-03-22 DIAGNOSIS — J3089 Other allergic rhinitis: Secondary | ICD-10-CM | POA: Diagnosis not present

## 2024-03-22 DIAGNOSIS — J3081 Allergic rhinitis due to animal (cat) (dog) hair and dander: Secondary | ICD-10-CM | POA: Diagnosis not present

## 2024-03-27 ENCOUNTER — Encounter: Payer: Self-pay | Admitting: Podiatry

## 2024-03-27 ENCOUNTER — Ambulatory Visit: Admitting: Podiatry

## 2024-03-27 DIAGNOSIS — M79674 Pain in right toe(s): Secondary | ICD-10-CM

## 2024-03-27 DIAGNOSIS — M79675 Pain in left toe(s): Secondary | ICD-10-CM | POA: Diagnosis not present

## 2024-03-27 DIAGNOSIS — I872 Venous insufficiency (chronic) (peripheral): Secondary | ICD-10-CM | POA: Diagnosis not present

## 2024-03-27 DIAGNOSIS — B351 Tinea unguium: Secondary | ICD-10-CM

## 2024-03-27 NOTE — Progress Notes (Signed)
This patient returns to my office for at risk foot care.  This patient requires this care by a professional since this patient will be at risk due to having venous insufficiency  This patient is unable to cut nails herself since the patient cannot reach her nails.These nails are painful walking and wearing shoes.  This patient presents for at risk foot care today.  General Appearance  Alert, conversant and in no acute stress.  Vascular  Dorsalis pedis and posterior tibial  pulses are palpable  bilaterally.  Capillary return is within normal limits  bilaterally. Temperature is within normal limits  bilaterally.  Neurologic  Senn-Weinstein monofilament wire test within normal limits  bilaterally. Muscle power within normal limits bilaterally.  Nails Thick disfigured discolored nails with subungual debris  from hallux to fifth toes bilaterally. No evidence of bacterial infection or drainage bilaterally.  Orthopedic  No limitations of motion  feet .  No crepitus or effusions noted.  HAV with hammer toes  B/L.  Skin  normotropic skin with no porokeratosis noted bilaterally.  No signs of infections or ulcers noted.     Onychomycosis  Pain in right toes  Pain in left toes  Consent was obtained for treatment procedures.   Mechanical debridement of nails 1-5  bilaterally performed with a nail nipper.  Filed with dremel without incident. No infection or ulcer.     Return office visit    12 weeks                Told patient to return for periodic foot care and evaluation due to potential at risk complications.   Hiro Vipond DPM  

## 2024-04-05 DIAGNOSIS — J3081 Allergic rhinitis due to animal (cat) (dog) hair and dander: Secondary | ICD-10-CM | POA: Diagnosis not present

## 2024-04-05 DIAGNOSIS — J301 Allergic rhinitis due to pollen: Secondary | ICD-10-CM | POA: Diagnosis not present

## 2024-04-05 DIAGNOSIS — J3089 Other allergic rhinitis: Secondary | ICD-10-CM | POA: Diagnosis not present

## 2024-04-19 DIAGNOSIS — J3081 Allergic rhinitis due to animal (cat) (dog) hair and dander: Secondary | ICD-10-CM | POA: Diagnosis not present

## 2024-04-19 DIAGNOSIS — J3089 Other allergic rhinitis: Secondary | ICD-10-CM | POA: Diagnosis not present

## 2024-04-19 DIAGNOSIS — J301 Allergic rhinitis due to pollen: Secondary | ICD-10-CM | POA: Diagnosis not present

## 2024-05-03 DIAGNOSIS — J301 Allergic rhinitis due to pollen: Secondary | ICD-10-CM | POA: Diagnosis not present

## 2024-05-03 DIAGNOSIS — J3089 Other allergic rhinitis: Secondary | ICD-10-CM | POA: Diagnosis not present

## 2024-05-03 DIAGNOSIS — J4540 Moderate persistent asthma, uncomplicated: Secondary | ICD-10-CM | POA: Diagnosis not present

## 2024-05-03 DIAGNOSIS — J3081 Allergic rhinitis due to animal (cat) (dog) hair and dander: Secondary | ICD-10-CM | POA: Diagnosis not present

## 2024-05-06 DIAGNOSIS — H40023 Open angle with borderline findings, high risk, bilateral: Secondary | ICD-10-CM | POA: Diagnosis not present

## 2024-05-14 ENCOUNTER — Other Ambulatory Visit: Payer: Self-pay | Admitting: Nurse Practitioner

## 2024-05-14 DIAGNOSIS — R053 Chronic cough: Secondary | ICD-10-CM

## 2024-05-14 MED ORDER — HYDROCOD POLI-CHLORPHE POLI ER 10-8 MG/5ML PO SUER: 5.0000 mL | Freq: Two times a day (BID) | ORAL | 0 refills | Status: AC | PRN

## 2024-05-14 NOTE — Telephone Encounter (Signed)
 Copied from CRM #[id number]. Topic: Clinical - Medication Refill >> May 14, 2024  1:07 PM Vivian Z wrote: Medication: chlorpheniramine -HYDROcodone  (TUSSIONEX) 10-8 MG/5ML  Has the patient contacted their pharmacy? Yes (Agent: If no, request that the patient contact the pharmacy for the refill. If patient does not wish to contact the pharmacy document the reason why and proceed with request.) (Agent: If yes, when and what did the pharmacy advise?)  This is the patient's preferred pharmacy:  [id number] - [place], [place] - [address] AT [facility] [address]  [place] [address] Phone: [phone] Fax: [phone]  Is this the correct pharmacy for this prescription? Yes If no, delete pharmacy and type the correct one.   Has the prescription been filled recently? no  Is the patient out of the medication? No  Has the patient been seen for an appointment in the last year OR does the patient have an upcoming appointment? Yes  Can we respond through MyChart? No  Agent: Please be advised that Rx refills may take up to 3 business days. We ask that you follow-up with your pharmacy.

## 2024-05-14 NOTE — Telephone Encounter (Signed)
 Patient requested refill.  Epic LR: 01/02/2024 Pended Rx and sent to [facility] for approval.

## 2024-05-24 DIAGNOSIS — J301 Allergic rhinitis due to pollen: Secondary | ICD-10-CM | POA: Diagnosis not present

## 2024-05-24 DIAGNOSIS — J3081 Allergic rhinitis due to animal (cat) (dog) hair and dander: Secondary | ICD-10-CM | POA: Diagnosis not present

## 2024-05-24 DIAGNOSIS — J3089 Other allergic rhinitis: Secondary | ICD-10-CM | POA: Diagnosis not present

## 2024-06-07 DIAGNOSIS — J3081 Allergic rhinitis due to animal (cat) (dog) hair and dander: Secondary | ICD-10-CM | POA: Diagnosis not present

## 2024-06-07 DIAGNOSIS — J3089 Other allergic rhinitis: Secondary | ICD-10-CM | POA: Diagnosis not present

## 2024-06-07 DIAGNOSIS — J301 Allergic rhinitis due to pollen: Secondary | ICD-10-CM | POA: Diagnosis not present

## 2024-06-27 ENCOUNTER — Encounter: Payer: Self-pay | Admitting: Podiatry

## 2024-06-27 ENCOUNTER — Ambulatory Visit: Admitting: Podiatry

## 2024-06-27 DIAGNOSIS — M79674 Pain in right toe(s): Secondary | ICD-10-CM | POA: Diagnosis not present

## 2024-06-27 DIAGNOSIS — I872 Venous insufficiency (chronic) (peripheral): Secondary | ICD-10-CM

## 2024-06-27 DIAGNOSIS — B351 Tinea unguium: Secondary | ICD-10-CM | POA: Diagnosis not present

## 2024-06-27 DIAGNOSIS — M79675 Pain in left toe(s): Secondary | ICD-10-CM | POA: Diagnosis not present

## 2024-06-27 NOTE — Progress Notes (Signed)
This patient returns to my office for at risk foot care.  This patient requires this care by a professional since this patient will be at risk due to having venous insufficiency  This patient is unable to cut nails herself since the patient cannot reach her nails.These nails are painful walking and wearing shoes.  This patient presents for at risk foot care today.  General Appearance  Alert, conversant and in no acute stress.  Vascular  Dorsalis pedis and posterior tibial  pulses are palpable  bilaterally.  Capillary return is within normal limits  bilaterally. Temperature is within normal limits  bilaterally.  Neurologic  Senn-Weinstein monofilament wire test within normal limits  bilaterally. Muscle power within normal limits bilaterally.  Nails Thick disfigured discolored nails with subungual debris  from hallux to fifth toes bilaterally. No evidence of bacterial infection or drainage bilaterally.  Orthopedic  No limitations of motion  feet .  No crepitus or effusions noted.  HAV with hammer toes  B/L.  Skin  normotropic skin with no porokeratosis noted bilaterally.  No signs of infections or ulcers noted.     Onychomycosis  Pain in right toes  Pain in left toes  Consent was obtained for treatment procedures.   Mechanical debridement of nails 1-5  bilaterally performed with a nail nipper.  Filed with dremel without incident. No infection or ulcer.     Return office visit    12 weeks                Told patient to return for periodic foot care and evaluation due to potential at risk complications.   Hiro Vipond DPM  

## 2024-08-02 ENCOUNTER — Ambulatory Visit (INDEPENDENT_AMBULATORY_CARE_PROVIDER_SITE_OTHER): Payer: Self-pay | Admitting: Nurse Practitioner

## 2024-08-02 ENCOUNTER — Encounter: Payer: Self-pay | Admitting: Nurse Practitioner

## 2024-08-02 VITALS — BP 132/78 | HR 84 | Temp 97.2°F | Resp 18 | Ht 62.08 in | Wt 227.5 lb

## 2024-08-02 DIAGNOSIS — E785 Hyperlipidemia, unspecified: Secondary | ICD-10-CM

## 2024-08-02 DIAGNOSIS — K219 Gastro-esophageal reflux disease without esophagitis: Secondary | ICD-10-CM | POA: Diagnosis not present

## 2024-08-02 DIAGNOSIS — J4520 Mild intermittent asthma, uncomplicated: Secondary | ICD-10-CM

## 2024-08-02 DIAGNOSIS — N3281 Overactive bladder: Secondary | ICD-10-CM | POA: Diagnosis not present

## 2024-08-02 DIAGNOSIS — J3089 Other allergic rhinitis: Secondary | ICD-10-CM | POA: Diagnosis not present

## 2024-08-02 DIAGNOSIS — F4322 Adjustment disorder with anxiety: Secondary | ICD-10-CM | POA: Diagnosis not present

## 2024-08-02 DIAGNOSIS — M8580 Other specified disorders of bone density and structure, unspecified site: Secondary | ICD-10-CM

## 2024-08-02 DIAGNOSIS — I10 Essential (primary) hypertension: Secondary | ICD-10-CM

## 2024-08-02 DIAGNOSIS — K5904 Chronic idiopathic constipation: Secondary | ICD-10-CM

## 2024-08-02 MED ORDER — MIRABEGRON ER 25 MG PO TB24: 25.0000 mg | ORAL_TABLET | Freq: Every day | ORAL | 0 refills | Status: AC

## 2024-08-02 NOTE — Progress Notes (Signed)
 Careteam: Patient Care Team: [name], NP as PCP - General (Geriatric Medicine) [name], MD as Consulting Physician (Ophthalmology)  PLACE OF SERVICE:  [facility]  Advanced Directive information    Allergies[1]  Chief Complaint  Patient presents with   Medical Management of Chronic Issues     6 month f/u, Patient is waiting on a phone from pharmacy to receive covid vaccine.    HPI:  Discussed the use of AI scribe software for clinical note transcription with the patient, who gave verbal consent to proceed.  History of Present Illness Wendy Figueroa is an 78 year old female who presents for a six-month follow-up visit.  She is awaiting a call from the pharmacy to receive her COVID vaccine, as it was out of stock during her last visit. She has already received her flu shot.  She has a history of overactive bladder and recently discontinued oxybutynin  one to two weeks ago. Since stopping the medication, she experiences increased urinary frequency and urgency, needing to urinate every one to two hours compared to every three to four hours while on oxybutynin . She does not recall using any other medications for this condition.  She has a chronic cough, which is managed with tussinex providing relief for six to eight hours. She is under the care of an allergist who suspects the cough is allergy-related. She was prescribed a new inhaler but is not using it due to cost. She is currently taking Singulair  for her allergies.  She is on Cymbalta  for mood management and reports stability with no increase in anxiety or depression.   She takes Colace for regular bowel movements  Currently taking calcium and vitamin D  for osteopenia.   Her blood pressure is managed with losartan  and Procardia   she experiences no worsening of indigestion or acid reflux as long as she takes her medication 15 to 30 minutes before eating.  Review of Systems:  Review of Systems   Constitutional:  Negative for chills, fever and weight loss.  HENT:  Negative for tinnitus.   Respiratory:  Positive for cough. Negative for sputum production and shortness of breath.   Cardiovascular:  Negative for chest pain, palpitations and leg swelling.  Gastrointestinal:  Negative for abdominal pain, constipation, diarrhea and heartburn.  Genitourinary:  Positive for frequency. Negative for dysuria and urgency.  Musculoskeletal:  Negative for back pain, falls, joint pain and myalgias.  Skin: Negative.   Neurological:  Negative for dizziness and headaches.  Psychiatric/Behavioral:  Negative for depression and memory loss. The patient does not have insomnia.     Past Medical History:  Diagnosis Date   Abnormal Pap smear    Over 6yrs ago   ALKALINE PHOSPHATASE, ELEVATED 07/01/2009   Qualifier: Diagnosis of  By: [name], [name]    Allergic rhinitis    Anemia    Arthritis    Asthma    Cataract    bilateral implants   Cough    occasional; 9-21: reports as ongoing cough , managing with cough medicine and scheduled and prn inhalers, reports mucus is clear and scant     Cystocele 11/05/08   Diverticulosis of colon    DJD (degenerative joint disease)    no per pt   Elevated alkaline phosphatase level    GERD (gastroesophageal reflux disease)    H/O urinary incontinence 11/06/2008   H/O varicella    Hard measles    Hx of colonic polyps    Hypercholesterolemia  Hypertension    Leg swelling    occasional   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 [name]   BLADDER SUSPENSION N/A 05/15/2019   Procedure: TRANSVAGINAL TAPE (TVT) PROCEDURE;  Surgeon: [name], MD;  Location: [facility];  Service: Gynecology;  Laterality: N/A;   bladder tack  2011   BREAST SURGERY  1978   cyst removed from both removed   cataracts   approx 10 years   Dr [name] , ctaract with lens placement    COLONOSCOPY     CYSTOSCOPY N/A 05/15/2019   Procedure: CYSTOSCOPY;  Surgeon: [name], MD;  Location: [facility];  Service: Gynecology;  Laterality: N/A;   ESOPHAGOGASTRODUODENOSCOPY     Social History:   reports that she has never smoked. She has never used smokeless tobacco. She reports current alcohol use of about 2.0 standard drinks of alcohol per week. She reports that she does not use drugs.  Family History  Problem Relation Age of Onset   Aneurysm Mother    Lung cancer Father    Hypertension Daughter    Hypertension Daughter    Colon cancer Neg Hx    Esophageal cancer Neg Hx    Stomach cancer Neg Hx    Rectal cancer Neg Hx    Colon polyps Neg Hx     Medications: Patient's Medications  New Prescriptions   No medications on file  Previous Medications   ALBUTEROL  (PROVENTIL  HFA;VENTOLIN  HFA) 108 (90 BASE) MCG/ACT INHALER    Inhale 2 puffs into the lungs every 6 (six) hours as needed for wheezing or shortness of breath.   CALCIUM CARB-CHOLECALCIFEROL (CALCIUM 600 + D PO)    Take 1 tablet by mouth 2 (two) times daily.    CETIRIZINE (ZYRTEC) 10 MG TABLET    Take 10 mg by mouth daily.   CHLORPHENIRAMINE -HYDROCODONE  (TUSSIONEX) 10-8 MG/5ML    Take 5 mLs by mouth every 12 (twelve) hours as needed for cough.   CHOLECALCIFEROL (D3-1000) 25 MCG (1000 UT) CAPSULE    Take 1,000 Units by mouth daily.   DOCUSATE SODIUM (COLACE) 100 MG CAPSULE    Take 100 mg by mouth daily.    DULOXETINE  (CYMBALTA ) 60 MG CAPSULE    TAKE 1 CAPSULE (60 MG TOTAL) BY MOUTH DAILY. DX (F43.22)   EPINEPHRINE 0.3 MG/0.3 ML IJ SOAJ INJECTION    epinephrine 0.3 mg/0.3 mL injection, auto-injector   FLUTICASONE -UMECLIDIN-VILANT (TRELEGY ELLIPTA) 200-62.5-25 MCG/ACT AEPB    Inhale 1 puff into the lungs daily.   FUROSEMIDE  (LASIX ) 40 MG TABLET    TAKE 1 TABLET BY MOUTH EVERY DAY   LEVOCETIRIZINE (XYZAL ) 5 MG TABLET    Take 1 tablet (5 mg total) by mouth every evening.   LOSARTAN  (COZAAR ) 100 MG TABLET    TAKE 1 TABLET BY MOUTH EVERY DAY   MONTELUKAST  (SINGULAIR ) 10 MG  TABLET    Take 10 mg by mouth at bedtime.   MULTIPLE VITAMINS-MINERALS (MACULAR VITAMIN BENEFIT PO)    Take 1 capsule by mouth 2 (two) times daily.   NIFEDIPINE  (PROCARDIA  XL/NIFEDICAL XL) 60 MG 24 HR TABLET    TAKE 1 TABLET BY MOUTH EVERY DAY   NON FORMULARY    Allergy shots every 2 weeks   OXYBUTYNIN  (DITROPAN ) 5 MG TABLET    TAKE 1 TABLET BY MOUTH TWICE A DAY   PANTOPRAZOLE  (PROTONIX ) 40 MG TABLET    TAKE 1 TABLET BY MOUTH TWICE A DAY FOR STOMACH   POTASSIUM CHLORIDE  SA (KLOR-CON  M) 20 MEQ TABLET    TAKE TWO TABLETS BY MOUTH ONCE DAILY FOR POTASSIUM SUPPLEMENT   PRAVASTATIN  (PRAVACHOL ) 40 MG TABLET    TAKE 1 TABLET BY MOUTH EVERY DAY   SYSTANE ULTRA 0.4-0.3 % SOLN    SMARTSIG:1 Drop(s) In Eye(s) As Needed   VITAMIN B-12 (CYANOCOBALAMIN) 1000 MCG TABLET    Take 1,000 mcg by mouth daily.  Modified Medications   No medications on file  Discontinued Medications   No medications on file    Physical Exam:  Vitals:   08/02/24 1343  BP: 132/78  Pulse: 84  Resp: 18  Temp: (!) 97.2 F (36.2 C)  SpO2: 94%  Weight: 227 lb 8 oz (103.2 kg)  Height: 5' 2.08 (1.577 m)   Body mass index is 41.5 kg/m. Wt Readings from Last 3 Encounters:  08/02/24 227 lb 8 oz (103.2 kg)  02/02/24 223 lb 12.8 oz (101.5 kg)  10/23/23 220 lb (99.8 kg)    Physical Exam Constitutional:      General: She is not in acute distress.    Appearance:  She is well-developed. She is not diaphoretic.  HENT:     Head: Normocephalic and atraumatic.     Mouth/Throat:     Pharynx: No oropharyngeal exudate.  Eyes:     Conjunctiva/sclera: Conjunctivae normal.     Pupils: Pupils are equal, round, and reactive to light.  Cardiovascular:     Rate and Rhythm: Normal rate and regular rhythm.     Heart sounds: Normal heart sounds.  Pulmonary:     Effort: Pulmonary effort is normal.     Breath sounds: Normal breath sounds.  Abdominal:     General: Bowel sounds are normal.     Palpations: Abdomen is soft.  Musculoskeletal:     Cervical back: Normal range of motion and neck supple.     Right lower leg: No edema.     Left lower leg: No edema.  Skin:    General: Skin is warm and dry.  Neurological:     Mental Status: She is alert and oriented to person, place, and time.     Motor: No weakness.     Gait: Gait normal.  Psychiatric:        Mood and Affect: Mood normal.     Labs reviewed: Basic Metabolic Panel: Recent Labs    02/02/24 1352  NA 140  K 4.2  CL 107  CO2 27  GLUCOSE 90  BUN 12  CREATININE 0.92  CALCIUM 9.4   Liver Function Tests: Recent Labs    02/02/24 1352  AST 20  ALT 14  BILITOT 0.5  PROT 7.5   No results for input(s): LIPASE, AMYLASE in the last 8760 hours. No results for input(s): AMMONIA in the last 8760 hours. CBC: Recent Labs    02/02/24 1352  WBC 6.7  NEUTROABS  3,243  HGB 11.3*  HCT 35.5  MCV 84.7  PLT 273   Lipid Panel: No results for input(s): CHOL, HDL, LDLCALC, TRIG, CHOLHDL, LDLDIRECT in the last 8760 hours. TSH: No results for input(s): TSH in the last 8760 hours. A1C: Lab Results  Component Value Date   HGBA1C 5.1 10/02/2023     Assessment/Plan  Assessment & Plan Overactive bladder Symptoms worsened post-oxybutynin  discontinuation. Myrbetriq considered due to fewer side effects. - Prescribed Myrbetriq. - Instructed to monitor blood pressure at home due  to side effect - Scheduled follow-up in one month.  Chronic cough due to allergic rhinitis Cough attributed to allergic rhinitis, managed by allergist. - Continue follow-up with allergist. -continues on tussionex PRN  Essential hypertension Blood pressure well-controlled, monitor due to Myrbetriq. - Monitor blood pressure at home. Continues on nifedipine , losartan , lasix  - Scheduled follow-up in one month.  Hyperlipidemia Due for cholesterol lab work. -will follow up lab today Continues on pravstatin 40 mg daily   Gastroesophageal reflux disease Well controlled on protonix   - Continue current management and monitor.  Adjustment disorder with anxiety Mood stable on Cymbalta . - Continue Cymbalta .  Chronic idiopathic constipation Regular bowel movements with current management. - Continue Colace.  Osteopenia Managed with calcium and vitamin D . - Continue calcium and vitamin D  supplementation.  General health maintenance Due for COVID vaccine, flu shot received. - Await call from pharmacy for COVID vaccine appointment.   Return in about 1 month (around 09/02/2024) for blood pressure, oab.  Dorsey Charette K. Caro BODILY [facility] [phone]     [1]  Allergies Allergen Reactions   Atenolol Other (See Comments)   Lisinopril Other (See Comments)   Meperidine Hcl Other (See Comments)   Penicillin G Sodium Other (See Comments)   Demerol Hives    All over the body    Lisinopril-Hydrochlorothiazide Hives    All over the body   Penicillins Hives    Has patient had a PCN reaction causing immediate rash, facial/tongue/throat swelling, SOB or lightheadedness with hypotension: No Has patient had a PCN reaction causing severe rash involving mucus membranes or skin necrosis: No Has patient had a PCN reaction that required hospitalization: No Has patient had a PCN reaction occurring within the last 10 years: No If all of the above answers are NO,  then may proceed with Cephalosporin use.   All over the body

## 2024-08-03 LAB — CBC WITH DIFFERENTIAL/PLATELET
Absolute Lymphocytes: 2635 {cells}/uL (ref 850–3900)
Absolute Monocytes: 639 {cells}/uL (ref 200–950)
Basophils Absolute: 50 {cells}/uL (ref 0–200)
Basophils Relative: 0.8 %
Eosinophils Absolute: 198 {cells}/uL (ref 15–500)
Eosinophils Relative: 3.2 %
HCT: 36.9 % (ref 35.9–46.0)
Hemoglobin: 11.5 g/dL — ABNORMAL LOW (ref 11.7–15.5)
MCH: 26.1 pg — ABNORMAL LOW (ref 27.0–33.0)
MCHC: 31.2 g/dL — ABNORMAL LOW (ref 31.6–35.4)
MCV: 83.9 fL (ref 81.4–101.7)
MPV: 10.9 fL (ref 7.5–12.5)
Monocytes Relative: 10.3 %
Neutro Abs: 2678 {cells}/uL (ref 1500–7800)
Neutrophils Relative %: 43.2 %
Platelets: 300 Thousand/uL (ref 140–400)
RBC: 4.4 Million/uL (ref 3.80–5.10)
RDW: 13.1 % (ref 11.0–15.0)
Total Lymphocyte: 42.5 %
WBC: 6.2 Thousand/uL (ref 3.8–10.8)

## 2024-08-03 LAB — COMPREHENSIVE METABOLIC PANEL WITH GFR
AG Ratio: 1.1 (calc) (ref 1.0–2.5)
ALT: 14 U/L (ref 6–29)
AST: 21 U/L (ref 10–35)
Albumin: 4 g/dL (ref 3.6–5.1)
Alkaline phosphatase (APISO): 180 U/L — ABNORMAL HIGH (ref 37–153)
BUN: 11 mg/dL (ref 7–25)
CO2: 28 mmol/L (ref 20–32)
Calcium: 9.6 mg/dL (ref 8.6–10.4)
Chloride: 104 mmol/L (ref 98–110)
Creat: 0.8 mg/dL (ref 0.60–0.95)
Globulin: 3.6 g/dL (ref 1.9–3.7)
Glucose, Bld: 94 mg/dL (ref 65–139)
Potassium: 4.4 mmol/L (ref 3.5–5.3)
Sodium: 139 mmol/L (ref 135–146)
Total Bilirubin: 0.5 mg/dL (ref 0.2–1.2)
Total Protein: 7.6 g/dL (ref 6.1–8.1)
eGFR: 74 mL/min/1.73m2 (ref >=60)

## 2024-08-03 LAB — LIPID PANEL
Cholesterol: 148 mg/dL (ref <200)
HDL: 56 mg/dL (ref >=50)
LDL Cholesterol (Calc): 76 mg/dL
Non-HDL Cholesterol (Calc): 92 mg/dL (ref <130)
Total CHOL/HDL Ratio: 2.6 (calc) (ref <5.0)
Triglycerides: 82 mg/dL (ref <150)

## 2024-08-05 ENCOUNTER — Ambulatory Visit: Payer: Self-pay | Admitting: Nurse Practitioner

## 2024-09-05 ENCOUNTER — Encounter: Payer: Self-pay | Admitting: Nurse Practitioner

## 2024-09-06 ENCOUNTER — Encounter: Admitting: Nurse Practitioner

## 2024-09-06 NOTE — Progress Notes (Signed)
 This encounter was created in error - please disregard.

## 2024-09-08 ENCOUNTER — Other Ambulatory Visit: Payer: Self-pay | Admitting: Nurse Practitioner

## 2024-09-23 ENCOUNTER — Ambulatory Visit: Admitting: Nurse Practitioner

## 2024-09-26 ENCOUNTER — Encounter: Payer: Self-pay | Admitting: Podiatry

## 2024-09-26 ENCOUNTER — Ambulatory Visit: Admitting: Podiatry

## 2024-09-26 DIAGNOSIS — I872 Venous insufficiency (chronic) (peripheral): Secondary | ICD-10-CM

## 2024-09-26 DIAGNOSIS — B351 Tinea unguium: Secondary | ICD-10-CM

## 2024-09-26 NOTE — Progress Notes (Signed)
This patient returns to my office for at risk foot care.  This patient requires this care by a professional since this patient will be at risk due to having venous insufficiency  This patient is unable to cut nails herself since the patient cannot reach her nails.These nails are painful walking and wearing shoes.  This patient presents for at risk foot care today.  General Appearance  Alert, conversant and in no acute stress.  Vascular  Dorsalis pedis and posterior tibial  pulses are palpable  bilaterally.  Capillary return is within normal limits  bilaterally. Temperature is within normal limits  bilaterally.  Neurologic  Senn-Weinstein monofilament wire test within normal limits  bilaterally. Muscle power within normal limits bilaterally.  Nails Thick disfigured discolored nails with subungual debris  from hallux to fifth toes bilaterally. No evidence of bacterial infection or drainage bilaterally.  Orthopedic  No limitations of motion  feet .  No crepitus or effusions noted.  HAV with hammer toes  B/L.  Skin  normotropic skin with no porokeratosis noted bilaterally.  No signs of infections or ulcers noted.     Onychomycosis  Pain in right toes  Pain in left toes  Consent was obtained for treatment procedures.   Mechanical debridement of nails 1-5  bilaterally performed with a nail nipper.  Filed with dremel without incident. No infection or ulcer.     Return office visit    12 weeks                Told patient to return for periodic foot care and evaluation due to potential at risk complications.   Hiro Vipond DPM  

## 2024-10-25 ENCOUNTER — Encounter: Payer: Self-pay | Admitting: Nurse Practitioner

## 2024-10-28 ENCOUNTER — Ambulatory Visit: Admitting: Nurse Practitioner

## 2024-12-24 ENCOUNTER — Ambulatory Visit: Admitting: Podiatry
